# Patient Record
Sex: Female | Born: 1945 | Race: White | Hispanic: No | Marital: Single | Smoking: Former smoker
Health system: Southern US, Community
[De-identification: ages and names within clinical notes are randomized; demographics above are authoritative.]

## PROBLEM LIST (undated history)

## (undated) DIAGNOSIS — J40 Bronchitis, not specified as acute or chronic: Secondary | ICD-10-CM

## (undated) DIAGNOSIS — D649 Anemia, unspecified: Secondary | ICD-10-CM

## (undated) DIAGNOSIS — R1900 Intra-abdominal and pelvic swelling, mass and lump, unspecified site: Secondary | ICD-10-CM

## (undated) DIAGNOSIS — M1990 Unspecified osteoarthritis, unspecified site: Secondary | ICD-10-CM

## (undated) DIAGNOSIS — I82409 Acute embolism and thrombosis of unspecified deep veins of unspecified lower extremity: Secondary | ICD-10-CM

## (undated) DIAGNOSIS — K219 Gastro-esophageal reflux disease without esophagitis: Secondary | ICD-10-CM

## (undated) DIAGNOSIS — I509 Heart failure, unspecified: Secondary | ICD-10-CM

## (undated) DIAGNOSIS — C44310 Basal cell carcinoma of skin of unspecified parts of face: Secondary | ICD-10-CM

## (undated) DIAGNOSIS — R112 Nausea with vomiting, unspecified: Secondary | ICD-10-CM

## (undated) DIAGNOSIS — I639 Cerebral infarction, unspecified: Secondary | ICD-10-CM

## (undated) DIAGNOSIS — J42 Unspecified chronic bronchitis: Secondary | ICD-10-CM

## (undated) DIAGNOSIS — Z9889 Other specified postprocedural states: Secondary | ICD-10-CM

## (undated) DIAGNOSIS — I809 Phlebitis and thrombophlebitis of unspecified site: Secondary | ICD-10-CM

## (undated) DIAGNOSIS — R0602 Shortness of breath: Secondary | ICD-10-CM

## (undated) DIAGNOSIS — F419 Anxiety disorder, unspecified: Secondary | ICD-10-CM

## (undated) DIAGNOSIS — C569 Malignant neoplasm of unspecified ovary: Secondary | ICD-10-CM

## (undated) HISTORY — PX: TOOTH EXTRACTION: SUR596

## (undated) HISTORY — PX: MOHS SURGERY: SUR867

## (undated) HISTORY — DX: Unspecified osteoarthritis, unspecified site: M19.90

## (undated) HISTORY — DX: Acute embolism and thrombosis of unspecified deep veins of unspecified lower extremity: I82.409

## (undated) HISTORY — DX: Intra-abdominal and pelvic swelling, mass and lump, unspecified site: R19.00

## (undated) HISTORY — DX: Phlebitis and thrombophlebitis of unspecified site: I80.9

## (undated) HISTORY — DX: Bronchitis, not specified as acute or chronic: J40

---
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
## 1974-04-24 HISTORY — PX: RHINOPLASTY: SUR1284

## 1996-04-24 HISTORY — PX: VEIN LIGATION AND STRIPPING: SHX2653

## 1998-12-14 ENCOUNTER — Other Ambulatory Visit: Admission: RE | Admit: 1998-12-14 | Discharge: 1998-12-14 | Payer: Self-pay | Admitting: *Deleted

## 1999-02-21 ENCOUNTER — Other Ambulatory Visit: Admission: RE | Admit: 1999-02-21 | Discharge: 1999-02-21 | Payer: Self-pay | Admitting: General Surgery

## 2000-01-17 ENCOUNTER — Encounter: Payer: Self-pay | Admitting: General Surgery

## 2000-01-17 ENCOUNTER — Encounter: Admission: RE | Admit: 2000-01-17 | Discharge: 2000-01-17 | Payer: Self-pay | Admitting: General Surgery

## 2001-02-22 ENCOUNTER — Ambulatory Visit (HOSPITAL_COMMUNITY): Admission: RE | Admit: 2001-02-22 | Discharge: 2001-02-22 | Payer: Self-pay | Admitting: General Surgery

## 2001-02-22 ENCOUNTER — Encounter: Payer: Self-pay | Admitting: General Surgery

## 2002-06-03 ENCOUNTER — Ambulatory Visit (HOSPITAL_COMMUNITY): Admission: RE | Admit: 2002-06-03 | Discharge: 2002-06-03 | Payer: Self-pay | Admitting: General Surgery

## 2002-06-03 ENCOUNTER — Encounter: Payer: Self-pay | Admitting: General Surgery

## 2006-06-28 ENCOUNTER — Other Ambulatory Visit: Admission: RE | Admit: 2006-06-28 | Discharge: 2006-06-28 | Payer: Self-pay | Admitting: *Deleted

## 2006-07-05 ENCOUNTER — Ambulatory Visit (HOSPITAL_COMMUNITY): Admission: RE | Admit: 2006-07-05 | Discharge: 2006-07-05 | Payer: Self-pay | Admitting: *Deleted

## 2010-05-15 ENCOUNTER — Encounter: Payer: Self-pay | Admitting: *Deleted

## 2012-04-24 DIAGNOSIS — I82409 Acute embolism and thrombosis of unspecified deep veins of unspecified lower extremity: Secondary | ICD-10-CM

## 2012-04-24 HISTORY — DX: Acute embolism and thrombosis of unspecified deep veins of unspecified lower extremity: I82.409

## 2012-06-06 ENCOUNTER — Ambulatory Visit: Payer: Self-pay | Admitting: Gynecologic Oncology

## 2012-06-11 ENCOUNTER — Encounter: Payer: Self-pay | Admitting: *Deleted

## 2012-06-12 ENCOUNTER — Ambulatory Visit: Payer: Medicare Other | Attending: Gynecologic Oncology | Admitting: Gynecologic Oncology

## 2012-06-12 ENCOUNTER — Encounter: Payer: Self-pay | Admitting: Gynecologic Oncology

## 2012-06-12 ENCOUNTER — Ambulatory Visit (HOSPITAL_COMMUNITY)
Admission: RE | Admit: 2012-06-12 | Discharge: 2012-06-12 | Disposition: A | Discharge status: Home / Self Care | Payer: Medicare Other | Source: Ambulatory Visit | Attending: Gynecologic Oncology | Admitting: Gynecologic Oncology

## 2012-06-12 VITALS — BP 118/70 | HR 80 | Temp 98.7°F | Resp 20 | Ht 67.0 in | Wt 151.8 lb

## 2012-06-12 VITALS — BP 116/62

## 2012-06-12 DIAGNOSIS — Z86718 Personal history of other venous thrombosis and embolism: Secondary | ICD-10-CM

## 2012-06-12 DIAGNOSIS — C569 Malignant neoplasm of unspecified ovary: Secondary | ICD-10-CM

## 2012-06-12 DIAGNOSIS — R971 Elevated cancer antigen 125 [CA 125]: Secondary | ICD-10-CM | POA: Insufficient documentation

## 2012-06-12 DIAGNOSIS — C801 Malignant (primary) neoplasm, unspecified: Secondary | ICD-10-CM | POA: Insufficient documentation

## 2012-06-12 DIAGNOSIS — R1900 Intra-abdominal and pelvic swelling, mass and lump, unspecified site: Secondary | ICD-10-CM

## 2012-06-12 DIAGNOSIS — N838 Other noninflammatory disorders of ovary, fallopian tube and broad ligament: Secondary | ICD-10-CM | POA: Insufficient documentation

## 2012-06-12 DIAGNOSIS — R180 Malignant ascites: Secondary | ICD-10-CM | POA: Insufficient documentation

## 2012-06-12 NOTE — Patient Instructions (Signed)
Follow up with Dr. Livesay as scheduled. 

## 2012-06-12 NOTE — Procedures (Signed)
US guided  diagnostic/therapeutic paracentesis performed yielding 2.8 liters yellow fluid. The fluid was sent for cytology. No immediate complications.

## 2012-06-12 NOTE — Progress Notes (Signed)
Consult Note: Gyn-Onc  Monique [name] 67 y.o. female  CC:  Chief Complaint  Patient presents with  . Pelvic Mass    New pt    HPI: Patient is seen today in consultation at the request of Dr. Everlene Other.  Patient is a 67 year old gravida 0 who in May 2013 had some sharp right-sided pain. 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 She also had an onset constipation and blame this on a poor diet. She began taking Citrucel which should help her symptoms somewhat. But 3 months later she finally sought gastroenterologist for scheduling her for colonoscopy. She had n/v with the prep and she was unable to tolerate the colonoscopy and that procedure which was  delayed for 7 weeks after the initial consultation was then pushed back for an additional 3 weeks. She again took a different prep and has a nausea vomiting and a colonoscopy had to be rescheduled again with the thought being that this would not be scheduled in January. She ultimately never had a colonoscopy done. She did think that she had some component of an irritable bowel syndrome she started taking probiotic her symptoms got better.   In February she was diagnosed with probable phlebitis had an initial peripheral venous Doppler that was negative. She began having increased pain in her left thigh had repeat Doppler showed a DVT on her left side. This subsequently prompted a CT scan which she had on February 15. It revealed a small right-sided pleural effusion. There is a focal pulmonary embolism in the right lower lobe of the pulmonary artery. There is extensive ascites at the abdomen and pelvis. There was a mass in the anterior segment of the right lobe of the liver near the dome measuring 1.6 x 1.4 cm. There were no other focal liver lesions identified. The spleen, pancreas, and adrenals appeared normal. There were no renal masses or hydronephrosis. There is a  mass abutting the omentum lateral to the dome of the liver on the right measuring 3.4 x 2.3 cm. There was diffuse thickening of the omentum in the right upper quadrant anteriorly and laterally. Within the pelvis there is a complex mass was cystic and solid components measuring 12.8 x 6.6 cm. It crosses the midline and extends to both the right and left sides. There is a deep venous thrombosis in the pelvic vessels. The inferior vena cava is diminutive and felt to be involved with thrombus. There multiple small retroperitoneal lymph nodes. There was an enlarged left inguinal lymph node measuring 1.8 x 1.6 cm and a right inguinal lymph node measuring 2.2 x 1.5 cm. CA 125 was drawn and was elevated at 569.9. It is for this reason that she comes in today. She was initially treated with Lovenox for 5 days and is currently on Xeloda 50 mg twice daily for 21 days and we'll go down to a dose of 20 mg daily.   Review of Systems: Over the past 2 weeks had increasing pain. She reports a 20 pound weight loss. She's had increasing ascites over the last 2-3 weeks. She does have some shortness of breath was attributing that more to be ascites in the pulmonary embolism. Her diet has been quite poor. She denies any chest pain any fevers or chills. She had a nausea vomiting as stated above with the prep. 10 point review of systems is otherwise negative. She is a  retired Producer, television/film/video. She Pap smear in 2011 that was negative. She never had a colonoscopy. She quit smoking December 2013 when she had an episode of bronchitis.  Current Meds:  Outpatient Encounter Prescriptions as of 06/12/2012  Medication Sig Dispense Refill  . aspirin EC 81 MG tablet Take 81 mg by mouth daily.      . Multiple Vitamins-Minerals (MULTIVITAMIN PO) Take 1 tablet by mouth daily.      . Rivaroxaban (XARELTO) 15 MG TABS tablet Take 15 mg by mouth 2 (two) times daily. Starting 05/22/2012 one 15MG  tablet oral Two times daily for 21 days then  convert to 20MG  daily      . Calcium Carbonate (CALCIUM 600) 1500 MG TABS Take 1 tablet by mouth.      . Probiotic Product (PROBIOTIC DAILY PO) Take by mouth.      . TRAMADOL HCL PO Take 50 mg by mouth 3 (three) times daily as needed. 1 tablet 3x daily as needed for leg pain       No facility-administered encounter medications on file as of 06/12/2012.    Allergy:  Allergies  Allergen Reactions  . Ampicillin Itching  . Fentanyl And Related Hives  . Neosporin (Neomycin-Bacitracin Zn-Polymyx) Swelling and Other (See Comments)    Swelling and inflamed eyes  . Septra (Sulfamethoxazole-Tmp Ds) Rash and Other (See Comments)    High fever, Burning skin    Social Hx:   History   Social History  . Marital Status: Single    Spouse Name: N/A    Number of Children: N/A  . Years of Education: N/A   Occupational History  . Not on file.   Social History Main Topics  . Smoking status: Former Smoker -- 30 years    Quit date: 04/20/2012  . Smokeless tobacco: Not on file     Comment: quit for a couple years then started back  . Alcohol Use: No  . Drug Use: No  . Sexually Active: Not on file   Other Topics Concern  . Not on file   Social History Narrative  . No narrative on file    Past Surgical Hx:  Past Surgical History  Procedure Laterality Date  . Appendectomy  1966    ruptured, hospital for 3 weeks  . Rhinoplasty  1976  . Vein ligation and stripping  1998    right leg  . Mohs surgery  2004, 2005    basal cell of the face    Past Medical Hx:  Past Medical History  Diagnosis Date  . Arthritis   . Skin cancer   . Acute venous embolism and thrombosis of unspecified deep vessels of lower extremity   . Bronchitis   . Abdominal or pelvic swelling, mass or lump, unspecified site   . Phlebitis and thrombophlebitis of unspecified site   . Pelvic mass     Family Hx:  Family History  Problem Relation Age of Onset  . Pneumonia Mother   . Heart attack Father      Vitals:  Blood pressure 118/70, pulse 80, temperature 98.7 F (37.1 C), resp. rate 20, height 5\' 7"  (1.702 m), weight 151 lb 12.8 oz (68.856 kg).  Physical Exam: Thin female in no acute distress.  Neck: Supple, no lymphadenopathy, no thyromegaly.  Lungs: Clear to auscultation bilaterally.  Cardiovascular exam: Regular rate and rhythm.  Abdomen: Distended. She's a vertical midline incision. She is a positive fluid wave. There is a sense of fullness in the lower  abdomen due difficult to discern a specific mass secondary to tense ascites. There is no rebound or guarding.  Groins: No lymphadenopathy.  Extremities: Varicosities left greater than right 1+ nonpitting edema on the left greater than right.  Pelvic: Normal external female genitalia. Bimanual examination was approximately 15 cm mass within the pelvis with a mass deep in the cul-de-sac it is not mobile. 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 I had a lengthy discussion with the patient as well as her significant other. Greater than 45 minutes face to face time was spent with the patient and her partner. I discussed with them that typically we would proceed with a debulking surgery and attempt to make her optimally cyto-reduced. However, in the setting of an acute pulmonary embolism as well as clot being noted within the vena cava, I believe we should start with a neoadjuvant approach. They seem to understand the rationale for this and are open to starting with chemotherapy.   Because of her symptoms as well as the need to make a diagnosis we did speak with radiology she'll undergo a paracentesis today for both diagnostic as well as therapeutic purposes.   We discussed starting with chemotherapy and that that would involve paclitaxel and carboplatin in either an every three-week or weekly  schedule. That we would proceed with a CT scan after 3 cycles of chemotherapy and then at that point determine whether or not that is the appropriate time to intervene with a debulking surgery. We will need to place an inferior vena cava filter and transition her to Lovenox in the perioperative period of time. She understands if we proceed with surgery after 3 cycles of chemotherapy that she will require chemotherapy after she recovers from surgery. I discussed with them that I may recommend a postsurgery be done at [facility] secondary to complex he of her case, and they are open to this if that is what is best in her opinion. Their questions were elicited in answer to their satisfaction. She has my card and knows to contact me if she has any questions. We will arrange for consultation with Dr. Darrold Span for chemotherapy and treatment coordination  Ikechukwu Cerny A., MD 06/12/2012, 3:10 PM

## 2012-06-13 ENCOUNTER — Other Ambulatory Visit (HOSPITAL_COMMUNITY): Payer: Medicare Other

## 2012-06-14 ENCOUNTER — Other Ambulatory Visit: Payer: Self-pay | Admitting: Oncology

## 2012-06-14 DIAGNOSIS — C569 Malignant neoplasm of unspecified ovary: Secondary | ICD-10-CM

## 2012-06-14 NOTE — Progress Notes (Signed)
Medical Oncology note of information  Request from gyn oncology to see this new patient for neoadjuvant chemotherapy. Information given to Tiffany to schedule her on my call day 06-19-12 + lab; chemo education class prior if possible (dose dense taxol [place]). Ila Mcgill, MD

## 2012-06-16 ENCOUNTER — Other Ambulatory Visit: Payer: Self-pay | Admitting: Oncology

## 2012-06-17 ENCOUNTER — Telehealth: Payer: Self-pay | Admitting: Oncology

## 2012-06-17 NOTE — Telephone Encounter (Signed)
S/W pt in re NP appt on 02/26 @ 9 w/Dr. Darrold Span. Chemo Edu 02/25 @ 12.  Pt confirmed appts.  Welcome packet at registration.

## 2012-06-18 ENCOUNTER — Encounter: Payer: Self-pay | Admitting: *Deleted

## 2012-06-18 ENCOUNTER — Other Ambulatory Visit: Payer: Medicare Other

## 2012-06-19 ENCOUNTER — Encounter: Payer: Self-pay | Admitting: Oncology

## 2012-06-19 ENCOUNTER — Other Ambulatory Visit: Payer: Self-pay | Admitting: Gynecology

## 2012-06-19 ENCOUNTER — Telehealth: Payer: Self-pay | Admitting: Oncology

## 2012-06-19 ENCOUNTER — Inpatient Hospital Stay
Admission: RE | Admit: 2012-06-19 | Discharge: 2012-06-19 | Disposition: A | Discharge status: Home / Self Care | Payer: Self-pay | Source: Ambulatory Visit | Attending: Gynecology | Admitting: Gynecology

## 2012-06-19 ENCOUNTER — Ambulatory Visit (HOSPITAL_BASED_OUTPATIENT_CLINIC_OR_DEPARTMENT_OTHER): Payer: Medicare Other

## 2012-06-19 ENCOUNTER — Ambulatory Visit (HOSPITAL_BASED_OUTPATIENT_CLINIC_OR_DEPARTMENT_OTHER): Payer: Medicare Other | Admitting: Oncology

## 2012-06-19 ENCOUNTER — Other Ambulatory Visit (HOSPITAL_BASED_OUTPATIENT_CLINIC_OR_DEPARTMENT_OTHER): Payer: Medicare Other | Admitting: Lab

## 2012-06-19 ENCOUNTER — Other Ambulatory Visit: Payer: Self-pay | Admitting: Oncology

## 2012-06-19 ENCOUNTER — Other Ambulatory Visit: Payer: Self-pay | Admitting: *Deleted

## 2012-06-19 ENCOUNTER — Telehealth: Payer: Self-pay | Admitting: *Deleted

## 2012-06-19 VITALS — BP 121/59 | HR 74 | Temp 97.3°F | Resp 18 | Ht 67.0 in | Wt 146.9 lb

## 2012-06-19 DIAGNOSIS — C569 Malignant neoplasm of unspecified ovary: Secondary | ICD-10-CM

## 2012-06-19 DIAGNOSIS — C801 Malignant (primary) neoplasm, unspecified: Secondary | ICD-10-CM

## 2012-06-19 DIAGNOSIS — Z23 Encounter for immunization: Secondary | ICD-10-CM

## 2012-06-19 DIAGNOSIS — Z86718 Personal history of other venous thrombosis and embolism: Secondary | ICD-10-CM

## 2012-06-19 DIAGNOSIS — R188 Other ascites: Secondary | ICD-10-CM

## 2012-06-19 DIAGNOSIS — C786 Secondary malignant neoplasm of retroperitoneum and peritoneum: Secondary | ICD-10-CM

## 2012-06-19 DIAGNOSIS — R194 Change in bowel habit: Secondary | ICD-10-CM

## 2012-06-19 DIAGNOSIS — R198 Other specified symptoms and signs involving the digestive system and abdomen: Secondary | ICD-10-CM

## 2012-06-19 LAB — COMPREHENSIVE METABOLIC PANEL (CC13)
CO2: 28 mEq/L (ref 22–29)
Creatinine: 0.8 mg/dL (ref 0.6–1.1)
Glucose: 92 mg/dl (ref 70–99)
Total Bilirubin: 0.35 mg/dL (ref 0.20–1.20)
Total Protein: 7.3 g/dL (ref 6.4–8.3)

## 2012-06-19 LAB — CBC WITH DIFFERENTIAL/PLATELET
Basophils Absolute: 0.1 10*3/uL (ref 0.0–0.1)
Eosinophils Absolute: 0.1 10*3/uL (ref 0.0–0.5)
HGB: 11.9 g/dL (ref 11.6–15.9)
LYMPH%: 14.1 % (ref 14.0–49.7)
MCV: 82.9 fL (ref 79.5–101.0)
MONO%: 8.7 % (ref 0.0–14.0)
NEUT#: 4.9 10*3/uL (ref 1.5–6.5)
NEUT%: 74.8 % (ref 38.4–76.8)
Platelets: 353 10*3/uL (ref 145–400)

## 2012-06-19 LAB — CEA: CEA: <0.5 ng/mL (ref 0.0–5.0)

## 2012-06-19 MED ORDER — ONDANSETRON HCL 8 MG PO TABS: 8.0000 mg | ORAL_TABLET | Freq: Two times a day (BID) | ORAL | Status: DC | PRN

## 2012-06-19 MED ORDER — LORAZEPAM 0.5 MG PO TABS: ORAL_TABLET | ORAL | Status: DC

## 2012-06-19 MED ORDER — DEXAMETHASONE 4 MG PO TABS: ORAL_TABLET | ORAL | Status: DC

## 2012-06-19 MED ORDER — PNEUMOCOCCAL VAC POLYVALENT 25 MCG/0.5ML IJ INJ
0.5000 mL | INJECTION | Freq: Once | INTRAMUSCULAR | Status: AC
  Administered 2012-06-19: 0.5 mL via INTRAMUSCULAR
  Filled 2012-06-19: qty 0.5

## 2012-06-19 NOTE — Progress Notes (Signed)
Checked in new patient. No financial issues. °

## 2012-06-19 NOTE — Patient Instructions (Signed)
Call if any questions or concerns, 24 hours/ every day     [phone]  Chemotherapy will be 9:15 AM on 2-28.  Twelve hours before chemo, ~ 9 PM on 2-27, you will take five of the 4 mg decadron tablets (=20 mg) with food. Again 6 hours before chemo, at ~ 3 AM on 2-28, you will take five of the 4 mg decadron tablets (=20mg ) with food.  ( If you do fine with the first 1-2  taxol infusions, we will decrease this premedication steroid to just the 12 hour prior dose.)  We will call in the decadron (dexamethasone), and also zofran (ondansetron) 8 mg to use 1-2 tablets (8-16 mg) every 12 hours as needed for nausea -- will not make drowsy, and Ativan 0.5 mg half to 1 tablet swallow or sublingual every 6 hrs as needed for nausea -- will make you drowsy  Night that you have chemo you should take 1/2 ativan at bedtime whether or not any nausea, and AM after chemo you should take zofran whether or not any nausea. Other than those doses, you can use these if any nausea/ queasiness.  If much reflux we should try zantac or equivalent in addition to [place].  Senokot S or pharmacy equivalent is often good if more constipation for a couple of days after chemo, or miralax. You can use the Senokot S 1-2 once or twice daily as needed.

## 2012-06-19 NOTE — Progress Notes (Signed)
[facility] NEW PATIENT EVALUATION   Name: Monique [name] Date: 06/19/2012 MRN: [id number] DOB: 30-May-1945  REFERRING PHYSICIAN: P.Gehrig CC: Aura Dials, MD (PCP [facility] [place] Monique [name],  Monique [name])   REASON FOR REFERRAL: newly diagnosed gyn cancer, for consideration of neoadjuvant chemotherapy    HISTORY OF PRESENT ILLNESS:Monique [name] is a 67 y.o. female who is seen at the request of Monique [name], with apparent advanced gyn adenocarcinoma, for possible neoadjuvant chemotherapy. Only intervention thus far has been diagnostic and therapeutic paracentesis of 2.8 liters on 06-12-12. Primary care is by Monique [name].  Patient developed sudden onset of constipation with some right abdominal pain in May 2013. She saw GI physician in ~ aug 2013, however was unable to tolerate prep for colonoscopy due to vomiting initially, then rescheduled the procedure and was again unable to tolerate prep such that this was never accomplished. Constipation improved with probiotics and Activia, now soft formed stools several times daily with just Activia. She had been aware of abdominal fullness more recently, with early satiety and some reflux symptoms. She developed LLE superficial phlebitis in Jan 2014 (dopplers by PCP negative for DVT then), followed by swelling and discomfort in left medial thigh with venous doppler at [facility] in [facility] on 05-21-12 with acute thrombus left common femoral vein. 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 She was begun on lovenox and transitioned to Xarelto by Monique [name], presently on xarelto 20 mg daily. CA 125 was 569 per Monique [name] note. She was seen by Monique [name] in [place] on 06-12-12, her exam remarkable for distended abdomen with fluid wave and 15 cm fixed mass in pelvis. As the inguinal nodes were not palpable and as she needed paracentesis for symptoms, an FNA of inguinal node was not done and she instead had US paracentesis by IR 06-12-12 for 2.8 liters of yellow fluid, cytology ( NZB14 - 126) with adenocarcinoma.  Monique [name] recommended neoadjuvant taxol/carboplatin chemotherapy either weekly dose dense or q 3 week cycles, with restaging scans after 3 cycles and consideration of interval surgery. Patient is aware that she will need IVC filter at least prior to surgery.  Patient attended chemotherapy teaching class prior to visit today.  REVIEW OF SYSTEMS:  No HA or [name] neurologic symptoms Good visual acuity with glasses.  No difficulty hearing No sinus symptoms No active dental problems. Has screw in place for dental implant since last year, that procedure not completed. Has lost weight, amount not clear due to the ascites. Had weighed 175 in 2011 "too heavy" and 168 in June 2013. Appetite is good tho some early satiety, and she tries to eat balanced diet. No nausea or vomiting. Has had significant reflux, improved with TUMS and she prefers not to try OTC or prescription H2 blocker yet. Bowels as above.  No SOB since  paracentesis, no cough, no chest pain. Had acute bronchitis in Dec 2013, treated with azithromycin. Stopped smoking then. Occasional palpitations, previously evaluated by Monique [name] Overdue mammograms. Some irregularities in bilateral breasts more noticeable with weight loss Voiding more  often at night, up 3-4x now, significant amounts, no dysuria No bleeding No swelling now in LLE. No tenderness thigh or lower leg Low back pain better since weight loss Some arthritis right hand  ALLERGIES: Ampicillin; Fentanyl and related; Neosporin; and Septra  PAST MEDICAL HISTORY:  Gravida 0 Appendectomy for ruptured appendix 1966 Rhinoplasty 1976 RLE vein stripping 1998 MOHS surgery for basal cell cas 2004, 2005 Last mammograms at [facility] 2008 Never colonoscopy Cardiology evaluation for palpitations with 48 hr Holter and ETT 2011, no B blockeer due to one episode of HR 40s while asleep. Did have flu vaccine this year Pneumovax given today at patient's request.  CURRENT MEDICATIONS: We administered pneumococcal 23 valent vaccine. [name] medications reviewed as listed in EMR. Prescriptions for decadron 20 mg 12 hrs and 6 hrs prior to taxol, zofran and ativan to [facility]    SOCIAL HISTORY:  reports that she quit smoking about 1 months ago. She does not have any smokeless tobacco history on file. She reports that she does not drink alcohol or use illicit drugs. Originally from [place], went to [employer] and nursing school in [place]. Retired Charity fundraiser, worked in Training and development officer at [employer]. Cigarettes x 30 years, varying amounts between 5 cigarettes and 1.5 packs daily, stopped various times and most recently stopped Dec 2-13 after bronchitis. Has gentleman friend who can assist with some transportation for chemo. Close friend died last year of cervical cancer and CVA. She describes herself as a very private person, is anxious about surgical procedures being done at [facility]. Has dog. Living Will and HCPOA completed, copied to be scanned into this EMR.  FAMILY HISTORY: family history includes Heart attack in her father and Pneumonia in her mother. Mother had cervical spine fracture in fall, was on ventilator and contracted pneumonia. No siblings No children  LABORATORY DATA:  Results for  orders placed in visit on 06/19/12 (from the past 48 hour(s))  CBC WITH DIFFERENTIAL     Status: Abnormal   Collection Time    06/19/12  9:06 AM      Result Value Range   WBC 6.5  3.9 - 10.3 10e3/uL   NEUT# 4.9  1.5 - 6.5 10e3/uL   HGB 11.9  11.6 - 15.9 g/dL   HCT 16.1  09.6 - 04.5 %   Platelets 353  145 - 400 10e3/uL   MCV 82.9  79.5 - 101.0 fL   MCH 27.0  25.1 - 34.0 pg   MCHC 32.6  31.5 - 36.0 g/dL   RBC 4.09  8.11 - 9.14 10e6/uL   RDW 16.0 (*) 11.2 - 14.5 %   lymph# 0.9  0.9 - 3.3 10e3/uL   MONO# 0.6  0.1 - 0.9 10e3/uL   Eosinophils Absolute 0.1  0.0 - 0.5 10e3/uL   Basophils Absolute 0.1  0.0 - 0.1 10e3/uL   NEUT% 74.8  38.4 - 76.8 %   LYMPH% 14.1  14.0 - 49.7 %   MONO% 8.7  0.0 - 14.0 %   EOS% 1.2  0.0 - 7.0 %   BASO% 1.2  0.0 - 2.0 %  COMPREHENSIVE METABOLIC PANEL (CC13)     Status: Abnormal   Collection Time    06/19/12  9:06 AM  Result Value Range   Sodium 140  136 - 145 mEq/L   Potassium 4.1  3.5 - 5.1 mEq/L   Chloride 103  98 - 107 mEq/L   CO2 28  22 - 29 mEq/L   Glucose 92  70 - 99 mg/dl   BUN 56.2  7.0 - 13.0 mg/dL   Creatinine 0.8  0.6 - 1.1 mg/dL   Total Bilirubin 8.65  0.20 - 1.20 mg/dL   Alkaline Phosphatase 134  40 - 150 U/L   AST 25  5 - 34 U/L   ALT 16  0 - 55 U/L   Total Protein 7.3  6.4 - 8.3 g/dL   Albumin 2.7 (*) 3.5 - 5.0 g/dL   Calcium 9.9  8.4 - 78.4 mg/dL     CA 696 pending CEA pending (see assessment below)  PATHOLOGY  Accession #: [id number] Collected Date: 06/12/2012  Diagnosis PERITONEAL/ASCITIC FLUID: MALIGNANT CELLS CONSISTENT WITH METASTATIC ADENOCARCINOMA. Jimmy Picket MD Pathologist, Electronic Signature (Case signed 06/14/2012) Specimen Clinical Information Pelvic mass, elevated CA125. Source Peritoneal/Ascitic Fluid Gross Specimen: Received is/are 556cc's of tan fluid with tissue. (BS:bs) Prepared: # Smears: 0 # Concentration Technique Slides (i.e. ThinPrep): 1 # Cell Block: 1 Additional Studies:  n/a Comment Comment: The differential includes metastatic ovarian carcinoma.  RADIOGRAPHY:  CT AP [facility] 05-29-2012  Report to be scanned into this EMR  [name] than lung bases on this CT, I do not believe that she has had any chest imaging with this illness.     PHYSICAL EXAM:  height is 5\' 7"  (1.702 m) and weight is 146 lb 14.4 oz (66.633 kg). Her oral temperature is 97.3 F (36.3 C). Her blood pressure is 121/59 and her pulse is 74. Her respiration is 18.  Very pleasant lady looks stated age, easily ambulatory, respirations not labored. Excellent historian. HEENT:  Normal hair pattern. PERRL, not icteric, EOMI. Oral mucosa moist and clear, no obvious dental problems. Neck supple without JVD or mass.  Lungs clear to bases bilaterally to A & P. Back not tender.  Heart RRR, no gallop, no murmur, clear heart sounds Breasts bilaterally with some minimal irregularities, no dominant mass or skin/ nipple findings. Right breast has 0.5 cm smooth rounded area consistent with cyst at 3:00. Left breast has slightly irregular thickening ~ 1 cm upper outer which patient states has been stable x years. Left  with 0.5x 1 cm mobile soft node in anterior axilla; right axilla not remarkable. No swelling either UE. Abdomen distended in upper quadrants bilaterally, not tight, not tender epigastrium, full in lower quadrants, quiet, not tender otherwise. No clear HSM or mass. LE no pitting edema bilaterally. Varicose veins bilaterally. Firm area ~ 1x2 cm with minimal erythema not tender left calf medially. Less defined area medial thigh ~ 2 cm not tender, no erythema or heat. UE no swelling or cords. Peripheral veins prominent. Lymphatics: no cervical, supraclavicular, right axillary or left inguinal adenopathy appreciated. Left axillary node as above. Vague fullness right inguinal region without clear adenopathy. Neuro without focal deficits CN, motor, sensory, cerebellar. Skin without rash or  ecchymosis. Not icteric.     All of history above reviewed with patient. We have discussed the presumed diagnosis of primary gyn malignancy based on CT information, gyn oncologist's exam, CA 125 and cytology from ascites. Following the visit, I have LM for pathologist in case additional immunohistochemical stains might be considered given rest of history, tho I agree with Monique [name] that clinically this  is entirely consistent with gyn primary. We have disucssed usefulness of neoadjuvant chemotherapy in gyn carcinomas, and reviewed the dose dense vs every 3 week taxol/ carboplatin. She is glad to start with the dose dense regimen and would like to start treatment as quickly as possible. We have discussed premedication steroids for taxol, which I would like to start at 20 mg 12 and 6 hrs prior, but hopefully will be able to decrease to one dose 12 hrs prior if she has no problems with taxol reaction. We have discussed antiemetics, how to contact this office/ on call MD, taxol aches, IVC filter by IR, anticoagulation concerns (thrombocytopenia from chemo), gCSF, peripheral IV access for chemo, transportation for chemo. We have been able to schedule day 1 cycle 1 carbo/ taxol for 06-21-12. I will dose reduce cycle 1 carboplatin due to IVC /pelvic/ LE clot with PE.    IMPRESSION / PLAN:  1. 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 I will see her back 06-26-12 with CBC CMET prior to day 8 on 06-28-12, then with day 15 treatment on 07-05-12. Expect to repeat scans and see Monique [name] after cycle 3. 2.Change in bowel habits in past year, unable to tolerate prep for colonoscopy. Bowels moving well now. She understands that she may have more constipation related to chemotherapy and can use miralax or senokot S if so. Have sent CEA with this history. 3.long tobacco recently discontinued. Will get baseline CXR and expect to get full  CT chest with reimaging scans. 4.extensive DVT involving IVC, pelvic veins and left common femoral, as well as superficial phlebitis LLE: continuing xarelto. Will need IVC filter prior to surgery. Hopefully platelets will maintain with chemo, carbo dose decreased to AUC 4 for this first cycle. 5.overdue mammograms: no obvious significant concerns on PE 6.ruptured appendix remotely 7.basal cell carcinomas 8. Pneumovax given today 9.Advance Directives and HCPOA done   Patient was comfortable with discussion, had questions answered to her satisfaction and was in agreement with plan above. Note message to pathologist and CXR order done after visit. Time spent face to face > 60 min, including > 50% in discussion, with additional coordination of care also.  Chace Bisch P, MD 06/19/2012 12:18 PM

## 2012-06-19 NOTE — Telephone Encounter (Signed)
Per staff message and POF I have scheduled appts.  JMW  

## 2012-06-19 NOTE — Telephone Encounter (Signed)
Gave pt appt for lab and Md for February and MArch 2014 emailed Marcelino Duster regarding chemo

## 2012-06-20 ENCOUNTER — Telehealth: Payer: Self-pay | Admitting: *Deleted

## 2012-06-20 ENCOUNTER — Telehealth: Payer: Self-pay | Admitting: Oncology

## 2012-06-20 NOTE — Telephone Encounter (Signed)
Message copied by Phillis Knack on Thu Jun 20, 2012 12:08 PM ------      Message from: Jama Flavors P      Created: Wed Jun 19, 2012  4:25 PM       Labs seen and need follow up: please let her know that chemistries all good except albumin low at 2.7, which is frequently seen with ascites. It would be best if she can increase protein in diet; she may want to speak to [facility] dietician at some point. Please also tell her that I would like plain CXR at [facility] this week or next, ok to coordinate with appointment at [facility]. I have already sent that request to schedulers.      cc LA, TH ------

## 2012-06-20 NOTE — Telephone Encounter (Signed)
Pt called back to say she had a CXR done at [facility] in January. Wants to know if it necessary to have another one. Informed her that RN will get CXR report for Dr Darrold Span to review. Will let her know on Friday when she comes for chemo.

## 2012-06-20 NOTE — Telephone Encounter (Signed)
Called pt and reminded her to go to Radiology for chest x-ray

## 2012-06-20 NOTE — Telephone Encounter (Signed)
Notified patient of Dr Darrold Span note below. States she is aware of how to increase protein in diet.

## 2012-06-20 NOTE — Telephone Encounter (Signed)
Message copied by Phillis Knack on Thu Jun 20, 2012  1:46 PM ------      Message from: Jama Flavors P      Created: Wed Jun 19, 2012  4:25 PM       Labs seen and need follow up: please let her know that chemistries all good except albumin low at 2.7, which is frequently seen with ascites. It would be best if she can increase protein in diet; she may want to speak to [facility] dietician at some point. Please also tell her that I would like plain CXR at [facility] this week or next, ok to coordinate with appointment at [facility]. I have already sent that request to schedulers.      cc LA, TH ------

## 2012-06-20 NOTE — Telephone Encounter (Signed)
No answer

## 2012-06-20 NOTE — Telephone Encounter (Signed)
Called pt left message regarding appt tomorrow and March 2014

## 2012-06-21 ENCOUNTER — Other Ambulatory Visit: Payer: Medicare Other | Admitting: Lab

## 2012-06-21 ENCOUNTER — Ambulatory Visit (HOSPITAL_BASED_OUTPATIENT_CLINIC_OR_DEPARTMENT_OTHER): Payer: Medicare Other

## 2012-06-21 VITALS — BP 117/61 | HR 55 | Temp 97.6°F | Resp 22

## 2012-06-21 DIAGNOSIS — Z5111 Encounter for antineoplastic chemotherapy: Secondary | ICD-10-CM

## 2012-06-21 DIAGNOSIS — C569 Malignant neoplasm of unspecified ovary: Secondary | ICD-10-CM

## 2012-06-21 MED ORDER — FAMOTIDINE IN NACL 20-0.9 MG/50ML-% IV SOLN
20.0000 mg | Freq: Once | INTRAVENOUS | Status: AC
  Administered 2012-06-21: 20 mg via INTRAVENOUS

## 2012-06-21 MED ORDER — HEPARIN SOD (PORK) LOCK FLUSH 100 UNIT/ML IV SOLN
500.0000 [IU] | Freq: Once | INTRAVENOUS | Status: DC | PRN
  Filled 2012-06-21: qty 5

## 2012-06-21 MED ORDER — ONDANSETRON 16 MG/50ML IVPB (CHCC)
16.0000 mg | Freq: Once | INTRAVENOUS | Status: AC
  Administered 2012-06-21: 16 mg via INTRAVENOUS

## 2012-06-21 MED ORDER — DEXAMETHASONE SODIUM PHOSPHATE 4 MG/ML IJ SOLN
20.0000 mg | Freq: Once | INTRAMUSCULAR | Status: AC
  Administered 2012-06-21: 20 mg via INTRAVENOUS

## 2012-06-21 MED ORDER — SODIUM CHLORIDE 0.9 % IV SOLN
394.8000 mg | Freq: Once | INTRAVENOUS | Status: AC
  Administered 2012-06-21: 390 mg via INTRAVENOUS
  Filled 2012-06-21: qty 39

## 2012-06-21 MED ORDER — PACLITAXEL CHEMO INJECTION 300 MG/50ML
80.0000 mg/m2 | Freq: Once | INTRAVENOUS | Status: AC
  Administered 2012-06-21: 144 mg via INTRAVENOUS
  Filled 2012-06-21: qty 24

## 2012-06-21 MED ORDER — SODIUM CHLORIDE 0.9 % IJ SOLN
10.0000 mL | INTRAMUSCULAR | Status: DC | PRN
  Filled 2012-06-21: qty 10

## 2012-06-21 MED ORDER — DIPHENHYDRAMINE HCL 50 MG/ML IJ SOLN
50.0000 mg | Freq: Once | INTRAMUSCULAR | Status: AC
  Administered 2012-06-21: 50 mg via INTRAVENOUS

## 2012-06-21 MED ORDER — SODIUM CHLORIDE 0.9 % IV SOLN
Freq: Once | INTRAVENOUS | Status: AC
  Administered 2012-06-21: 10:00:00 via INTRAVENOUS

## 2012-06-21 NOTE — Patient Instructions (Signed)
[facility] Discharge Instructions for Patients Receiving Chemotherapy  Today you received the following chemotherapy agents: taxol, carboplatin  To help prevent nausea and vomiting after your treatment, we encourage you to take your nausea medication.  Take it as often as prescribed.    If you develop nausea and vomiting that is not controlled by your nausea medication, call the clinic. If it is after clinic hours your family physician or the after hours number for the clinic or go to the Emergency Department.   BELOW ARE SYMPTOMS THAT SHOULD BE REPORTED IMMEDIATELY:  *FEVER GREATER THAN 100.5 F  *CHILLS WITH OR WITHOUT FEVER  NAUSEA AND VOMITING THAT IS NOT CONTROLLED WITH YOUR NAUSEA MEDICATION  *UNUSUAL SHORTNESS OF BREATH  *UNUSUAL BRUISING OR BLEEDING  TENDERNESS IN MOUTH AND THROAT WITH OR WITHOUT PRESENCE OF ULCERS  *URINARY PROBLEMS  *BOWEL PROBLEMS  UNUSUAL RASH Items with * indicate a potential emergency and should be followed up as soon as possible.  One of the nurses will contact you 24 hours after your treatment. Please let the nurse know about any problems that you may have experienced. Feel free to call the clinic you have any questions or concerns. The clinic phone number is [phone].   I have been informed and understand all the instructions given to me. I know to contact the clinic, my physician, or go to the Emergency Department if any problems should occur. I do not have any questions at this time, but understand that I may call the clinic during office hours   should I have any questions or need assistance in obtaining follow up care.    __________________________________________  _____________  __________ Signature of Patient or Authorized Representative            Date                   Time    __________________________________________ Nurse's Signature   Paclitaxel injection (Taxol) What is this medicine? PACLITAXEL (PAK  li TAX el) is a chemotherapy drug. It targets fast dividing cells, like cancer cells, and causes these cells to die. This medicine is used to treat ovarian cancer, breast cancer, and other cancers. This medicine may be used for other purposes; ask your health care provider or pharmacist if you have questions. What should I tell my health care provider before I take this medicine? They need to know if you have any of these conditions: -blood disorders -irregular heartbeat -infection (especially a virus infection such as chickenpox, cold sores, or herpes) -liver disease -previous or ongoing radiation therapy -an unusual or allergic reaction to paclitaxel, alcohol, polyoxyethylated castor oil, other chemotherapy agents, other medicines, foods, dyes, or preservatives -pregnant or trying to get pregnant -breast-feeding How should I use this medicine? This drug is given as an infusion into a vein. It is administered in a hospital or clinic by a specially trained health care professional. Talk to your pediatrician regarding the use of this medicine in children. Special care may be needed. Overdosage: If you think you have taken too much of this medicine contact a poison control center or emergency room at once. NOTE: This medicine is only for you. Do not share this medicine with others. What if I miss a dose? It is important not to miss your dose. Call your doctor or health care professional if you are unable to keep an appointment. What may interact with this medicine? Do not take this medicine with any of the   following medications: -disulfiram -metronidazole This medicine may also interact with the following medications: -cyclosporine -dexamethasone -diazepam -ketoconazole -medicines to increase blood counts like filgrastim, pegfilgrastim, sargramostim -other chemotherapy drugs like cisplatin, doxorubicin, epirubicin, etoposide, teniposide,  vincristine -quinidine -testosterone -vaccines -verapamil Talk to your doctor or health care professional before taking any of these medicines: -acetaminophen -aspirin -ibuprofen -ketoprofen -naproxen This list may not describe all possible interactions. Give your health care provider a list of all the medicines, herbs, non-prescription drugs, or dietary supplements you use. Also tell them if you smoke, drink alcohol, or use illegal drugs. Some items may interact with your medicine. What should I watch for while using this medicine? Your condition will be monitored carefully while you are receiving this medicine. You will need important blood work done while you are taking this medicine. This drug may make you feel generally unwell. This is not uncommon, as chemotherapy can affect healthy cells as well as cancer cells. Report any side effects. Continue your course of treatment even though you feel ill unless your doctor tells you to stop. In some cases, you may be given additional medicines to help with side effects. Follow all directions for their use. Call your doctor or health care professional for advice if you get a fever, chills or sore throat, or other symptoms of a cold or flu. Do not treat yourself. This drug decreases your body's ability to fight infections. Try to avoid being around people who are sick. This medicine may increase your risk to bruise or bleed. Call your doctor or health care professional if you notice any unusual bleeding. Be careful brushing and flossing your teeth or using a toothpick because you may get an infection or bleed more easily. If you have any dental work done, tell your dentist you are receiving this medicine. Avoid taking products that contain aspirin, acetaminophen, ibuprofen, naproxen, or ketoprofen unless instructed by your doctor. These medicines may hide a fever. Do not become pregnant while taking this medicine. Women should inform their doctor if  they wish to become pregnant or think they might be pregnant. There is a potential for serious side effects to an unborn child. Talk to your health care professional or pharmacist for more information. Do not breast-feed an infant while taking this medicine. Men are advised not to father a child while receiving this medicine. What side effects may I notice from receiving this medicine? Side effects that you should report to your doctor or health care professional as soon as possible: -allergic reactions like skin rash, itching or hives, swelling of the face, lips, or tongue -low blood counts - This drug may decrease the number of white blood cells, red blood cells and platelets. You may be at increased risk for infections and bleeding. -signs of infection - fever or chills, cough, sore throat, pain or difficulty passing urine -signs of decreased platelets or bleeding - bruising, pinpoint red spots on the skin, black, tarry stools, nosebleeds -signs of decreased red blood cells - unusually weak or tired, fainting spells, lightheadedness -breathing problems -chest pain -high or low blood pressure -mouth sores -nausea and vomiting -pain, swelling, redness or irritation at the injection site -pain, tingling, numbness in the hands or feet -slow or irregular heartbeat -swelling of the ankle, feet, hands Side effects that usually do not require medical attention (report to your doctor or health care professional if they continue or are bothersome): -bone pain -complete hair loss including hair on your head, underarms, pubic hair, eyebrows, and   eyelashes -changes in the color of fingernails -diarrhea -loosening of the fingernails -loss of appetite -muscle or joint pain -red flush to skin -sweating This list may not describe all possible side effects. Call your doctor for medical advice about side effects. You may report side effects to FDA at 1-800-FDA-1088. Where should I keep my  medicine? This drug is given in a hospital or clinic and will not be stored at home. NOTE: This sheet is a summary. It may not cover all possible information. If you have questions about this medicine, talk to your doctor, pharmacist, or health care provider.  2012, [employer] Standard. (03/23/2008 11:54:26 AM)  Carboplatin injection What is this medicine? CARBOPLATIN (KAR boe pla tin) is a chemotherapy drug. It targets fast dividing cells, like cancer cells, and causes these cells to die. This medicine is used to treat ovarian cancer and many other cancers. This medicine may be used for other purposes; ask your health care provider or pharmacist if you have questions. What should I tell my health care provider before I take this medicine? They need to know if you have any of these conditions: -blood disorders -hearing problems -kidney disease -recent or ongoing radiation therapy -an unusual or allergic reaction to carboplatin, cisplatin, other chemotherapy, other medicines, foods, dyes, or preservatives -pregnant or trying to get pregnant -breast-feeding How should I use this medicine? This drug is usually given as an infusion into a vein. It is administered in a hospital or clinic by a specially trained health care professional. Talk to your pediatrician regarding the use of this medicine in children. Special care may be needed. Overdosage: If you think you have taken too much of this medicine contact a poison control center or emergency room at once. NOTE: This medicine is only for you. Do not share this medicine with others. What if I miss a dose? It is important not to miss a dose. Call your doctor or health care professional if you are unable to keep an appointment. What may interact with this medicine? -medicines for seizures -medicines to increase blood counts like filgrastim, pegfilgrastim, sargramostim -some antibiotics like amikacin, gentamicin, neomycin, streptomycin,  tobramycin -vaccines Talk to your doctor or health care professional before taking any of these medicines: -acetaminophen -aspirin -ibuprofen -ketoprofen -naproxen This list may not describe all possible interactions. Give your health care provider a list of all the medicines, herbs, non-prescription drugs, or dietary supplements you use. Also tell them if you smoke, drink alcohol, or use illegal drugs. Some items may interact with your medicine. What should I watch for while using this medicine? Your condition will be monitored carefully while you are receiving this medicine. You will need important blood work done while you are taking this medicine. This drug may make you feel generally unwell. This is not uncommon, as chemotherapy can affect healthy cells as well as cancer cells. Report any side effects. Continue your course of treatment even though you feel ill unless your doctor tells you to stop. In some cases, you may be given additional medicines to help with side effects. Follow all directions for their use. Call your doctor or health care professional for advice if you get a fever, chills or sore throat, or other symptoms of a cold or flu. Do not treat yourself. This drug decreases your body's ability to fight infections. Try to avoid being around people who are sick. This medicine may increase your risk to bruise or bleed. Call your doctor or health care professional if   you notice any unusual bleeding. Be careful brushing and flossing your teeth or using a toothpick because you may get an infection or bleed more easily. If you have any dental work done, tell your dentist you are receiving this medicine. Avoid taking products that contain aspirin, acetaminophen, ibuprofen, naproxen, or ketoprofen unless instructed by your doctor. These medicines may hide a fever. Do not become pregnant while taking this medicine. Women should inform their doctor if they wish to become pregnant or think  they might be pregnant. There is a potential for serious side effects to an unborn child. Talk to your health care professional or pharmacist for more information. Do not breast-feed an infant while taking this medicine. What side effects may I notice from receiving this medicine? Side effects that you should report to your doctor or health care professional as soon as possible: -allergic reactions like skin rash, itching or hives, swelling of the face, lips, or tongue -signs of infection - fever or chills, cough, sore throat, pain or difficulty passing urine -signs of decreased platelets or bleeding - bruising, pinpoint red spots on the skin, black, tarry stools, nosebleeds -signs of decreased red blood cells - unusually weak or tired, fainting spells, lightheadedness -breathing problems -changes in hearing -changes in vision -chest pain -high blood pressure -low blood counts - This drug may decrease the number of white blood cells, red blood cells and platelets. You may be at increased risk for infections and bleeding. -nausea and vomiting -pain, swelling, redness or irritation at the injection site -pain, tingling, numbness in the hands or feet -problems with balance, talking, walking -trouble passing urine or change in the amount of urine Side effects that usually do not require medical attention (report to your doctor or health care professional if they continue or are bothersome): -hair loss -loss of appetite -metallic taste in the mouth or changes in taste This list may not describe all possible side effects. Call your doctor for medical advice about side effects. You may report side effects to FDA at 1-800-FDA-1088. Where should I keep my medicine? This drug is given in a hospital or clinic and will not be stored at home. NOTE: This sheet is a summary. It may not cover all possible information. If you have questions about this medicine, talk to your doctor, pharmacist, or health care  provider.  2012, [employer] Standard. (07/16/2007 2:38:05 PM) 

## 2012-06-21 NOTE — Progress Notes (Signed)
1135 - Pt reports feeling "a little lightheaded".  Pt told to let RN know if it gets any worse - possibly Benadryl side effect.

## 2012-06-24 ENCOUNTER — Telehealth: Payer: Self-pay | Admitting: *Deleted

## 2012-06-24 ENCOUNTER — Other Ambulatory Visit: Payer: Self-pay | Admitting: *Deleted

## 2012-06-24 DIAGNOSIS — C569 Malignant neoplasm of unspecified ovary: Secondary | ICD-10-CM

## 2012-06-24 DIAGNOSIS — K30 Functional dyspepsia: Secondary | ICD-10-CM

## 2012-06-24 MED ORDER — PANTOPRAZOLE SODIUM 40 MG PO TBEC: 40.0000 mg | DELAYED_RELEASE_TABLET | Freq: Every day | ORAL | Status: DC

## 2012-06-24 NOTE — Telephone Encounter (Signed)
Called patient for chemo f/u.  Reports she left a message five minutes ago.  Concerned because she took Xarelto at 9:00 am, ate yogurt and vomited at 10:00 am.  Concerned about getting a clot.  Also vomited last night at 11:00 am.  No nausea except briefly this morning and has passed as quickly as it came on.  Reports constipation.  Last bm was Thursday before chemo started.  Small bm last night after taking two doses of senokot-S.  Asked if she should take Miralax she has on hand.  Denies passing flatus.  Denies abdominal firmness or swelling but does have some ascites.  Hard to drink water but getting close to 64 oz daily.  Has indigestion and "Has been eating Tums for weeks" and took zantac.  Will notify providers.

## 2012-06-24 NOTE — Telephone Encounter (Signed)
Message copied by Augusto Garbe on Mon Jun 24, 2012 10:38 AM ------      Message from: Lorri Frederick      Created: Fri Jun 21, 2012 10:03 AM      Regarding: 1st time chemo      Contact: [phone]       Taxol/carbo ------

## 2012-06-24 NOTE — Telephone Encounter (Signed)
See Chemotherapy f/u note.  Patient reports indigestion, constipation and asked about Xarelto.  Verbal orders received and read back from dr. Darrold Span that patient does not have to repeat Xarelto.  Needs to stop taking Tums and for her to start Protonix 40 mg daily.  Patient has only taken senokot-S one day and can increase the dose.  Okay to take Mira lax if she'd like.  Called patient with these orders.  Asked that we change her pharmacy to the [facility] on the corner of [address] because it's closer to her.  Patient able to tell me all the instructions she's been given and how to follow.  Has already taken two senokot-S reportedly.

## 2012-06-26 ENCOUNTER — Encounter: Payer: Self-pay | Admitting: Oncology

## 2012-06-26 ENCOUNTER — Ambulatory Visit (HOSPITAL_BASED_OUTPATIENT_CLINIC_OR_DEPARTMENT_OTHER): Payer: Medicare Other | Admitting: Oncology

## 2012-06-26 ENCOUNTER — Other Ambulatory Visit (HOSPITAL_BASED_OUTPATIENT_CLINIC_OR_DEPARTMENT_OTHER): Payer: Medicare Other | Admitting: Lab

## 2012-06-26 VITALS — BP 103/60 | HR 63 | Temp 97.9°F | Resp 20 | Ht 67.0 in | Wt 144.9 lb

## 2012-06-26 DIAGNOSIS — I82409 Acute embolism and thrombosis of unspecified deep veins of unspecified lower extremity: Secondary | ICD-10-CM

## 2012-06-26 DIAGNOSIS — C801 Malignant (primary) neoplasm, unspecified: Secondary | ICD-10-CM

## 2012-06-26 DIAGNOSIS — I808 Phlebitis and thrombophlebitis of other sites: Secondary | ICD-10-CM

## 2012-06-26 DIAGNOSIS — C569 Malignant neoplasm of unspecified ovary: Secondary | ICD-10-CM

## 2012-06-26 DIAGNOSIS — C786 Secondary malignant neoplasm of retroperitoneum and peritoneum: Secondary | ICD-10-CM

## 2012-06-26 LAB — COMPREHENSIVE METABOLIC PANEL (CC13)
ALT: 17 U/L (ref 0–55)
AST: 31 U/L (ref 5–34)
Albumin: 2.8 g/dL — ABNORMAL LOW (ref 3.5–5.0)
Alkaline Phosphatase: 136 U/L (ref 40–150)
BUN: 16.8 mg/dL (ref 7.0–26.0)
Chloride: 102 mEq/L (ref 98–107)
Potassium: 4.1 mEq/L (ref 3.5–5.1)
Sodium: 139 mEq/L (ref 136–145)
Total Protein: 7.4 g/dL (ref 6.4–8.3)

## 2012-06-26 LAB — CBC WITH DIFFERENTIAL/PLATELET
BASO%: 0.8 % (ref 0.0–2.0)
EOS%: 1.4 % (ref 0.0–7.0)
MCH: 27.2 pg (ref 25.1–34.0)
MCHC: 33.2 g/dL (ref 31.5–36.0)
MCV: 82.1 fL (ref 79.5–101.0)
MONO%: 4.9 % (ref 0.0–14.0)
RBC: 4.21 10*6/uL (ref 3.70–5.45)
RDW: 16.2 % — ABNORMAL HIGH (ref 11.2–14.5)
lymph#: 0.8 10*3/uL — ABNORMAL LOW (ref 0.9–3.3)

## 2012-06-26 NOTE — Patient Instructions (Addendum)
Decrease premedication decadron to five tablets (=20 mg) 12 hours prior to taxol. Take with food.  Hold ASA and probiotic now  OK to use either 300 mg zantac (2 tablets) or protonix daily

## 2012-06-26 NOTE — Progress Notes (Signed)
OFFICE PROGRESS NOTE   06/26/2012   Physicians:P.Lazaro Arms, MD (PCP [facility] [place] Elpidio Anis, Verdis Prime)   INTERVAL HISTORY:   Patient is seen, alone for visit, now having begun neoadjuvant dose dense carbo/taxol for advanced gyn carcinoma, day 1 cycle 1 given 06-21-12. 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 She had paracentesis for 2.8 liters of malignant ascites on 06-12-12. She does not have PAC.  Patient developed sudden onset of constipation with some right abdominal pain in May 2013. She saw GI physician in ~ Aug 2013, however was unable to tolerate prep for colonoscopy, rescheduled the procedure and was again unable to tolerate prep such that this was never accomplished. Constipation improved with probiotics and Activia, now soft formed stools several times daily with just Activia. She had been aware of abdominal fullness more recently, with early satiety and some reflux symptoms. She developed LLE superficial phlebitis in Jan 2014 (dopplers by PCP negative for DVT then), followed by swelling and discomfort in left medial thigh with venous doppler at [facility] in [facility] on 05-21-12 with acute thrombus left common femoral vein. 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 She was  begun on lovenox and transitioned to Xarelto by Dr Everlene Other, presently on xarelto 20 mg daily. CA 125 was 569 per Dr Denman George note. She was seen by Dr Duard Brady in [place] on 06-12-12, her exam remarkable for distended abdomen with fluid wave and 15 cm fixed mass in pelvis. As the inguinal nodes were not palpable and as she needed paracentesis for symptoms, an FNA of inguinal node was not done and she instead had US paracentesis by IR 06-12-12 for 2.8 liters of yellow fluid, cytology ( NZB14 - 126) with adenocarcinoma. Neoadjuvant dose dense carbo/taxol was beun 06-21-12.  Patient was unable to sleep after premedication decadron 20 m 12 hrs and 6 hrs prior to taxol, but had no reactions during that infusion. Peripheral IV access was easily accomplished by second RN who attempted and we have discussed good hydration prior to IV access. She had 2 episodes of vomiting, one of which was ~ an hour after oral xarelto. She was very constipated, resolved with miralax. She had more symptomatic GERD, stopped TUMS, has tried OTC zantac and will increase this either to 2 zantac daily or begin protonix 40 m daily. She has been able to eat and drink fluids. She finds it easier to take deep breath. She has had no fever or symptoms of infection, no bleeding, no increased SOB, no chest pain. She  Remainder of 10 point Review of Systems negative.  Objective:  Vital signs in last 24 hours:  BP 103/60  Pulse 63  Temp(Src) 97.9 F (36.6 C) (Oral)  Resp 20  Ht 5\' 7"  (1.702 m)  Wt 144 lb 14.4 oz (  65.726 kg)  BMI 22.69 kg/m2 Easily ambulatory, respirations not labored RA, looks comfortable. No alopecia.   HEENT:PERRLA, sclera clear, anicteric and oropharynx clear, no lesions LymphaticsCervical, supraclavicular, and axillary nodes normal. Resp: somewhat diminished BS thruout otherwise clear to A&P Cardio: regular rate and rhythm GI: full, soft, no increased distension, some BS, not tender epiastrium, no clear  HSM Extremities: no pitting edema, cords, tenderness LE. Varicosities. Neuro:no sensory deficits noted Skin without irritation at previous IV site RUE or attempted IV sites LUE. No ecchymoses or petechiae   Lab Results:  Results for orders placed in visit on 06/26/12  CBC WITH DIFFERENTIAL      Result Value Range   WBC 5.3  3.9 - 10.3 10e3/uL   NEUT# 4.1  1.5 - 6.5 10e3/uL   HGB 11.5 (*) 11.6 - 15.9 g/dL   HCT 82.9 (*) 56.2 - 13.0 %   Platelets 295  145 - 400 10e3/uL   MCV 82.1  79.5 - 101.0 fL   MCH 27.2  25.1 - 34.0 pg   MCHC 33.2  31.5 - 36.0 g/dL   RBC 8.65  7.84 - 6.96 10e6/uL   RDW 16.2 (*) 11.2 - 14.5 %   lymph# 0.8 (*) 0.9 - 3.3 10e3/uL   MONO# 0.3  0.1 - 0.9 10e3/uL   Eosinophils Absolute 0.1  0.0 - 0.5 10e3/uL   Basophils Absolute 0.0  0.0 - 0.1 10e3/uL   NEUT% 78.1 (*) 38.4 - 76.8 %   LYMPH% 14.8  14.0 - 49.7 %   MONO% 4.9  0.0 - 14.0 %   EOS% 1.4  0.0 - 7.0 %   BASO% 0.8  0.0 - 2.0 %    CMET available after visit normal with exception of glucose 101 and albumin 2.8. Creat 0.7  Studies/Results: Pathology immunohistochemical stains added to cytology from 06-12-12, which I discussed with Dr Luisa Hart. These are consistent with gyn primary, likely ovarian. I have discussed with patient now and have given her a copy of that pathology information.  CXR report from [facility] in [place]1-29-14 NAD,  scanned into this EMR. Discussed with patient now.  Medications: I have reviewed the patient's current medications. She will continue H2 blocker and miralax. She has discussed interactions with xarelto with her pharmacist. She will continue to hold probiotic. She will decrease oral premedication decadron to 20 mg 12 hrs prior to taxol. She will hold ASA on the xarelto and with possibility of thrombocytopenia related to chemo. Written and oral instructions for meds given.  Assessment/Plan:  1. 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 I am watching patient and labs closely as we begin particularly with anticoagulation for extensive DVT. She will have day 8 cycle 1 on 06-28-12 and I will see her with day 15 treatment on 07-05-12. Expect to repeat scans and see Dr Duard Brady after cycle 3.  2.Change in bowel habits in past year, unable to tolerate prep for colonoscopy. Bowels moving with miralax. CEA not elevated.  3.long tobacco recently discontinued.  baseline CXR at outside facility as above,  expect to get full CT chest with reimaging scans (no CT angio chest done to date) 4.extensive DVT involving IVC, pelvic veins and left common femoral, as well as superficial phlebitis LLE: continuing xarelto. Will need IVC filter prior to surgery. Hopefully platelets will maintain with chemo, carbo dose decreased to AUC 4 for this first cycle.  5.overdue mammograms: no obvious significant concerns on PE  6.ruptured appendix remotely  7.basal cell carcinomas  8. Pneumovax given  9.Advance Directives and HCPOA done  Patient is appreciative of care and in agreement with plan above.   LIVESAY,LENNIS P, MD   06/26/2012, 9:04 AM

## 2012-06-27 ENCOUNTER — Other Ambulatory Visit: Payer: Self-pay

## 2012-06-27 DIAGNOSIS — C569 Malignant neoplasm of unspecified ovary: Secondary | ICD-10-CM

## 2012-06-28 ENCOUNTER — Ambulatory Visit (HOSPITAL_BASED_OUTPATIENT_CLINIC_OR_DEPARTMENT_OTHER): Payer: Medicare Other

## 2012-06-28 ENCOUNTER — Other Ambulatory Visit (HOSPITAL_BASED_OUTPATIENT_CLINIC_OR_DEPARTMENT_OTHER): Payer: Medicare Other | Admitting: Lab

## 2012-06-28 VITALS — BP 111/53 | HR 63 | Temp 97.7°F | Resp 18

## 2012-06-28 DIAGNOSIS — C786 Secondary malignant neoplasm of retroperitoneum and peritoneum: Secondary | ICD-10-CM

## 2012-06-28 DIAGNOSIS — C801 Malignant (primary) neoplasm, unspecified: Secondary | ICD-10-CM

## 2012-06-28 DIAGNOSIS — C569 Malignant neoplasm of unspecified ovary: Secondary | ICD-10-CM

## 2012-06-28 DIAGNOSIS — Z5111 Encounter for antineoplastic chemotherapy: Secondary | ICD-10-CM

## 2012-06-28 LAB — CBC WITH DIFFERENTIAL/PLATELET
BASO%: 0.4 % (ref 0.0–2.0)
Basophils Absolute: 0 10*3/uL (ref 0.0–0.1)
EOS%: 0.1 % (ref 0.0–7.0)
HGB: 11.2 g/dL — ABNORMAL LOW (ref 11.6–15.9)
MCH: 27.6 pg (ref 25.1–34.0)
MCHC: 33.2 g/dL (ref 31.5–36.0)
MCV: 83.1 fL (ref 79.5–101.0)
MONO%: 4.3 % (ref 0.0–14.0)
NEUT%: 81.4 % — ABNORMAL HIGH (ref 38.4–76.8)
RDW: 16.2 % — ABNORMAL HIGH (ref 11.2–14.5)

## 2012-06-28 MED ORDER — ONDANSETRON 8 MG/50ML IVPB (CHCC)
8.0000 mg | Freq: Once | INTRAVENOUS | Status: AC
  Administered 2012-06-28: 8 mg via INTRAVENOUS

## 2012-06-28 MED ORDER — DEXAMETHASONE SODIUM PHOSPHATE 4 MG/ML IJ SOLN
20.0000 mg | Freq: Once | INTRAMUSCULAR | Status: AC
  Administered 2012-06-28: 20 mg via INTRAVENOUS

## 2012-06-28 MED ORDER — DIPHENHYDRAMINE HCL 50 MG/ML IJ SOLN
50.0000 mg | Freq: Once | INTRAMUSCULAR | Status: AC
  Administered 2012-06-28: 50 mg via INTRAVENOUS

## 2012-06-28 MED ORDER — PACLITAXEL CHEMO INJECTION 300 MG/50ML
80.0000 mg/m2 | Freq: Once | INTRAVENOUS | Status: AC
  Administered 2012-06-28: 144 mg via INTRAVENOUS
  Filled 2012-06-28: qty 24

## 2012-06-28 MED ORDER — FAMOTIDINE IN NACL 20-0.9 MG/50ML-% IV SOLN
20.0000 mg | Freq: Once | INTRAVENOUS | Status: AC
  Administered 2012-06-28: 20 mg via INTRAVENOUS

## 2012-06-28 MED ORDER — SODIUM CHLORIDE 0.9 % IV SOLN
Freq: Once | INTRAVENOUS | Status: AC
  Administered 2012-06-28: 12:00:00 via INTRAVENOUS

## 2012-06-28 NOTE — Patient Instructions (Addendum)
[facility] Discharge Instructions for Patients Receiving Chemotherapy  Today you received the following chemotherapy agents taxol  To help prevent nausea and vomiting after your treatment, we encourage you to take your nausea medication  and take it as often as prescribed   If you develop nausea and vomiting that is not controlled by your nausea medication, call the clinic. If it is after clinic hours your family physician or the after hours number for the clinic or go to the Emergency Department.   BELOW ARE SYMPTOMS THAT SHOULD BE REPORTED IMMEDIATELY:  *FEVER GREATER THAN 100.5 F  *CHILLS WITH OR WITHOUT FEVER  NAUSEA AND VOMITING THAT IS NOT CONTROLLED WITH YOUR NAUSEA MEDICATION  *UNUSUAL SHORTNESS OF BREATH  *UNUSUAL BRUISING OR BLEEDING  TENDERNESS IN MOUTH AND THROAT WITH OR WITHOUT PRESENCE OF ULCERS  *URINARY PROBLEMS  *BOWEL PROBLEMS  UNUSUAL RASH Items with * indicate a potential emergency and should be followed up as soon as possible.  One of the nurses will contact you 24 hours after your treatment. Please let the nurse know about any problems that you may have experienced. Feel free to call the clinic you have any questions or concerns. The clinic phone number is [phone].   I have been informed and understand all the instructions given to me. I know to contact the clinic, my physician, or go to the Emergency Department if any problems should occur. I do not have any questions at this time, but understand that I may call the clinic during office hours   should I have any questions or need assistance in obtaining follow up care.    __________________________________________  _____________  __________ Signature of Patient or Authorized Representative            Date                   Time    __________________________________________ Nurse's Signature    

## 2012-06-30 ENCOUNTER — Other Ambulatory Visit: Payer: Self-pay | Admitting: Oncology

## 2012-07-03 ENCOUNTER — Other Ambulatory Visit: Payer: Medicare Other | Admitting: Lab

## 2012-07-05 ENCOUNTER — Ambulatory Visit (HOSPITAL_BASED_OUTPATIENT_CLINIC_OR_DEPARTMENT_OTHER): Payer: Medicare Other

## 2012-07-05 ENCOUNTER — Ambulatory Visit (HOSPITAL_BASED_OUTPATIENT_CLINIC_OR_DEPARTMENT_OTHER): Payer: Medicare Other | Admitting: Oncology

## 2012-07-05 ENCOUNTER — Encounter: Payer: Self-pay | Admitting: Oncology

## 2012-07-05 ENCOUNTER — Telehealth: Payer: Self-pay | Admitting: Oncology

## 2012-07-05 ENCOUNTER — Other Ambulatory Visit (HOSPITAL_BASED_OUTPATIENT_CLINIC_OR_DEPARTMENT_OTHER): Payer: Medicare Other | Admitting: Lab

## 2012-07-05 VITALS — BP 126/66 | HR 97 | Temp 97.2°F | Resp 20 | Ht 67.0 in | Wt 147.0 lb

## 2012-07-05 DIAGNOSIS — C569 Malignant neoplasm of unspecified ovary: Secondary | ICD-10-CM

## 2012-07-05 DIAGNOSIS — C786 Secondary malignant neoplasm of retroperitoneum and peritoneum: Secondary | ICD-10-CM

## 2012-07-05 DIAGNOSIS — C801 Malignant (primary) neoplasm, unspecified: Secondary | ICD-10-CM

## 2012-07-05 DIAGNOSIS — C4491 Basal cell carcinoma of skin, unspecified: Secondary | ICD-10-CM

## 2012-07-05 DIAGNOSIS — I82619 Acute embolism and thrombosis of superficial veins of unspecified upper extremity: Secondary | ICD-10-CM

## 2012-07-05 DIAGNOSIS — Z5111 Encounter for antineoplastic chemotherapy: Secondary | ICD-10-CM

## 2012-07-05 LAB — CBC WITH DIFFERENTIAL/PLATELET
BASO%: 0 % (ref 0.0–2.0)
EOS%: 0 % (ref 0.0–7.0)
HCT: 34.1 % — ABNORMAL LOW (ref 34.8–46.6)
MCH: 26.6 pg (ref 25.1–34.0)
MCHC: 32 g/dL (ref 31.5–36.0)
NEUT%: 73.2 % (ref 38.4–76.8)
lymph#: 0.6 10*3/uL — ABNORMAL LOW (ref 0.9–3.3)

## 2012-07-05 MED ORDER — SODIUM CHLORIDE 0.9 % IV SOLN
Freq: Once | INTRAVENOUS | Status: AC
  Administered 2012-07-05: 11:00:00 via INTRAVENOUS

## 2012-07-05 MED ORDER — DIPHENHYDRAMINE HCL 50 MG/ML IJ SOLN
50.0000 mg | Freq: Once | INTRAMUSCULAR | Status: AC
  Administered 2012-07-05: 50 mg via INTRAVENOUS

## 2012-07-05 MED ORDER — DEXAMETHASONE SODIUM PHOSPHATE 4 MG/ML IJ SOLN
20.0000 mg | Freq: Once | INTRAMUSCULAR | Status: AC
  Administered 2012-07-05: 20 mg via INTRAVENOUS

## 2012-07-05 MED ORDER — FAMOTIDINE IN NACL 20-0.9 MG/50ML-% IV SOLN
20.0000 mg | Freq: Once | INTRAVENOUS | Status: AC
  Administered 2012-07-05: 20 mg via INTRAVENOUS

## 2012-07-05 MED ORDER — PACLITAXEL CHEMO INJECTION 300 MG/50ML
80.0000 mg/m2 | Freq: Once | INTRAVENOUS | Status: AC
  Administered 2012-07-05: 144 mg via INTRAVENOUS
  Filled 2012-07-05: qty 24

## 2012-07-05 MED ORDER — ONDANSETRON 8 MG/50ML IVPB (CHCC)
8.0000 mg | Freq: Once | INTRAVENOUS | Status: AC
  Administered 2012-07-05: 8 mg via INTRAVENOUS

## 2012-07-05 NOTE — Telephone Encounter (Signed)
gv an dprinted appt schedule for pt for March and April....printed tx for [facility]

## 2012-07-05 NOTE — Patient Instructions (Signed)
[facility] Discharge Instructions for Patients Receiving Chemotherapy  Today you received the following chemotherapy agent: Taxol  To help prevent nausea and vomiting after your treatment, we encourage you to take your nausea medications:  Ativan 0.5 mg tablet-1/2 to 1 tablet every 6 hours as needed  Zofran 8 mg tablet- 1 tablet every 12 hours as needed   If you develop nausea and vomiting that is not controlled by your nausea medication, call the clinic. If it is after clinic hours your family physician or the after hours number for the clinic or go to the Emergency Department.   BELOW ARE SYMPTOMS THAT SHOULD BE REPORTED IMMEDIATELY:  *FEVER GREATER THAN 100.5 F  *CHILLS WITH OR WITHOUT FEVER  NAUSEA AND VOMITING THAT IS NOT CONTROLLED WITH YOUR NAUSEA MEDICATION  *UNUSUAL SHORTNESS OF BREATH  *UNUSUAL BRUISING OR BLEEDING  TENDERNESS IN MOUTH AND THROAT WITH OR WITHOUT PRESENCE OF ULCERS  *URINARY PROBLEMS  *BOWEL PROBLEMS  UNUSUAL RASH Items with * indicate a potential emergency and should be followed up as soon as possible.  Feel free to call the clinic you have any questions or concerns. The clinic phone number is [phone].   I have been informed and understand all the instructions given to me. I know to contact the clinic, my physician, or go to the Emergency Department if any problems should occur. I do not have any questions at this time, but understand that I may call the clinic during office hours   should I have any questions or need assistance in obtaining follow up care.    __________________________________________  _____________  __________ Signature of Patient or Authorized Representative            Date                   Time    __________________________________________ Nurse's Signature

## 2012-07-05 NOTE — Progress Notes (Signed)
1020-OK to treat with CBC results from today per Dr. Darrold Span.

## 2012-07-05 NOTE — Progress Notes (Signed)
OFFICE PROGRESS NOTE   07/05/2012   Physicians:P.Lazaro Arms, MD (PCP [facility] [place] Elpidio Anis, Verdis Prime)   INTERVAL HISTORY:   Patient is seen, alone for visit, due day 15 cycle 1 dose dense taxol carboplatin today. She is tolerating chemotherapy well so far, with bowels now moving daily on SenokotS, some improvement in abdominal fullness, good improvement in GERD now on Protonix, no taxol aches or peripheral neuropathy and no nausea.Last paracentesis was for 2.8 liters on 06-12-12. She has not needed gCSF thus far and does not have central catheter. She will have repeat scans (including CT chest) and see Dr Duard Brady on 08-21-12, after cycle 3.  Patient developed sudden onset of constipation with some right abdominal pain in May 2013. She saw GI physician in ~ aug 2013, however was unable to tolerate prep for colonoscopy due to vomiting initially, then rescheduled the procedure and was again unable to tolerate prep such that this was never accomplished. Constipation improved with probiotics and Activia, now soft formed stools several times daily with just Activia. She had been aware of abdominal fullness more recently, with early satiety and some reflux symptoms. She developed LLE superficial phlebitis in Jan 2014 (dopplers by PCP negative for DVT then), followed by swelling and discomfort in left medial thigh with venous doppler at [facility] in [facility] on 05-21-12 with acute thrombus left common femoral vein. 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 She was begun on lovenox and transitioned to Xarelto by Dr Everlene Other, presently on xarelto 20 mg daily. CA 125 was 569 per Dr Denman George note. She was seen by Dr Duard Brady in [place] on 06-12-12, her exam remarkable for distended abdomen with fluid wave and 15 cm fixed mass in pelvis. As the inguinal nodes were not palpable and as she needed paracentesis for symptoms, an FNA of inguinal node was not done and she instead had US paracentesis by IR 06-12-12 for 2.8 liters of yellow fluid, cytology (NZB14 - 126) with adenocarcinoma. Cycle 1 day 1 dose dense caarboplatin/ taxol was 06-21-12.  ROS as above. Legs were briefly tingling after IV benadryl premed.No fever or symptoms of infection. No pain. Abdomen is still significantly more full than her usual. Minimal swelling LLE. No SOB or chest pain. Remainder of 10 point Review of Systems negative.  Objective:  Vital signs in last 24 hours:  BP 126/66  Pulse 97  Temp(Src) 97.2 F (36.2 C) (Oral)  Resp 20  Ht 5\' 7"  (1.702 m)  Wt 147 lb (66.679 kg)  BMI 23.02 kg/m2 Weight is up 2 lbs.Easily ambulatory, respirations not labored RA. Looks comfortable.   HEENT:PERRLA, sclera clear, anicteric, oropharynx clear, no lesions and neck supple with midline trachea. No alopecia LymphaticsCervical, supraclavicular, and axillary nodes normal. Resp: clear to auscultation bilaterally and normal percussion bilaterally, diminished BS in bases only. Cardio: regular rate and rhythm  GI: full especially in upper quadrants, not tight. Few bowel sounds. Not tender Extremities: trace pedal edema on left LE otherwise no edema, cords, tenderness Neuro:no sensory deficits noted Skin without rash or ecchymosis  Lab Results:  Results for orders placed in visit on 07/05/12  CBC  WITH DIFFERENTIAL      Result Value Range   WBC 2.3 (*) 3.9 - 10.3 10e3/uL   NEUT# 1.7  1.5 - 6.5 10e3/uL   HGB 10.9 (*) 11.6 - 15.9 g/dL   HCT 52.8 (*) 41.3 - 24.4 %   Platelets 279  145 - 400 10e3/uL   MCV 83.2  79.5 - 101.0 fL   MCH 26.6  25.1 - 34.0 pg   MCHC 32.0  31.5 - 36.0 g/dL   RBC 0.10  2.72 - 5.36 10e6/uL   RDW 17.0 (*) 11.2 - 14.5 %   lymph# 0.6 (*) 0.9 - 3.3 10e3/uL   MONO# 0.1  0.1 - 0.9 10e3/uL   Eosinophils Absolute 0.0  0.0 - 0.5 10e3/uL   Basophils Absolute 0.0  0.0 - 0.1 10e3/uL   NEUT% 73.2  38.4 - 76.8 %   LYMPH% 24.6  14.0 - 49.7 %   MONO% 2.2  0.0 - 14.0 %   EOS% 0.0  0.0 - 7.0 %   BASO% 0.0  0.0 - 2.0 %   nRBC 0  0 - 0 %     Studies/Results:  No results found.  Medications: I have reviewed the patient's current medications. Discussed continuing protonix daily. She forgot to take xarelto at 0900 today, ok to take that when she gets home per [facility] pharmacist. DIscussed possible need for addition of gCSF depending on counts.  Assessment/Plan  1. 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 Expect to repeat CT ap + CT chest and see Dr Duard Brady after cycle 3, set up for 08-21-12.  2.Change in bowel habits in past year, unable to tolerate prep for colonoscopy. Bowels moving with miralax. CEA not elevated.  3.long tobacco recently discontinued. baseline CXR at outside facility as above, expect to get full CT chest with reimaging scans (no CT angio chest done to date)  4.extensive DVT involving IVC, pelvic veins and left common femoral, as well as superficial phlebitis LLE: continuing xarelto. Will need IVC filter prior to surgery. Hopefully platelets will maintain with chemo, carbo dose decreased to AUC 4 for this first cycle.  5.overdue mammograms: no obvious significant concerns on PE  6.ruptured appendix remotely  7.basal cell carcinomas  8.  Pneumovax given  9.Advance Directives and HCPOA done  I will see her back with cbc,cmet,ca125 on 3-19 prior to day 1 cycle 2 on 07-12-12. Will decide based on those labs if [place] will stay at AUC=4 for day 1 cycle 2 due 07-12-12.  Patient was in agreement with plan and pleased with how well treatment has gone thus far.   LIVESAY,LENNIS P, MD   07/05/2012, 9:25 AM

## 2012-07-05 NOTE — Patient Instructions (Signed)
Continue Protonix daily  Appointments as scheduled

## 2012-07-08 ENCOUNTER — Other Ambulatory Visit: Payer: Self-pay | Admitting: Oncology

## 2012-07-10 ENCOUNTER — Ambulatory Visit (HOSPITAL_BASED_OUTPATIENT_CLINIC_OR_DEPARTMENT_OTHER): Payer: Medicare Other | Admitting: Oncology

## 2012-07-10 ENCOUNTER — Other Ambulatory Visit (HOSPITAL_BASED_OUTPATIENT_CLINIC_OR_DEPARTMENT_OTHER): Payer: Medicare Other | Admitting: Lab

## 2012-07-10 ENCOUNTER — Other Ambulatory Visit: Payer: Self-pay | Admitting: Oncology

## 2012-07-10 ENCOUNTER — Telehealth: Payer: Self-pay | Admitting: Oncology

## 2012-07-10 ENCOUNTER — Encounter: Payer: Self-pay | Admitting: Oncology

## 2012-07-10 VITALS — BP 127/67 | HR 68 | Temp 97.2°F | Resp 20 | Ht 67.0 in | Wt 142.8 lb

## 2012-07-10 DIAGNOSIS — C786 Secondary malignant neoplasm of retroperitoneum and peritoneum: Secondary | ICD-10-CM

## 2012-07-10 DIAGNOSIS — C569 Malignant neoplasm of unspecified ovary: Secondary | ICD-10-CM

## 2012-07-10 DIAGNOSIS — C801 Malignant (primary) neoplasm, unspecified: Secondary | ICD-10-CM

## 2012-07-10 DIAGNOSIS — I82619 Acute embolism and thrombosis of superficial veins of unspecified upper extremity: Secondary | ICD-10-CM

## 2012-07-10 DIAGNOSIS — Z87891 Personal history of nicotine dependence: Secondary | ICD-10-CM

## 2012-07-10 LAB — COMPREHENSIVE METABOLIC PANEL (CC13)
ALT: 19 U/L (ref 0–55)
AST: 27 U/L (ref 5–34)
CO2: 26 mEq/L (ref 22–29)
Calcium: 9.6 mg/dL (ref 8.4–10.4)
Chloride: 104 mEq/L (ref 98–107)
Sodium: 139 mEq/L (ref 136–145)
Total Protein: 7.6 g/dL (ref 6.4–8.3)

## 2012-07-10 LAB — CBC WITH DIFFERENTIAL/PLATELET
Eosinophils Absolute: 0 10*3/uL (ref 0.0–0.5)
MONO#: 0.2 10*3/uL (ref 0.1–0.9)
NEUT#: 2.3 10*3/uL (ref 1.5–6.5)
RBC: 4.14 10*6/uL (ref 3.70–5.45)
RDW: 17.5 % — ABNORMAL HIGH (ref 11.2–14.5)
WBC: 3.5 10*3/uL — ABNORMAL LOW (ref 3.9–10.3)
lymph#: 0.9 10*3/uL (ref 0.9–3.3)
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
## 2012-07-11 LAB — CA 125: CA 125: 539.7 U/mL — ABNORMAL HIGH (ref 0.0–30.2)

## 2012-07-12 ENCOUNTER — Other Ambulatory Visit (HOSPITAL_BASED_OUTPATIENT_CLINIC_OR_DEPARTMENT_OTHER): Payer: Medicare Other | Admitting: Lab

## 2012-07-12 ENCOUNTER — Other Ambulatory Visit: Payer: Self-pay | Admitting: Oncology

## 2012-07-12 ENCOUNTER — Ambulatory Visit (HOSPITAL_BASED_OUTPATIENT_CLINIC_OR_DEPARTMENT_OTHER): Payer: Medicare Other

## 2012-07-12 VITALS — BP 115/65 | HR 67 | Temp 96.9°F | Resp 17

## 2012-07-12 DIAGNOSIS — C801 Malignant (primary) neoplasm, unspecified: Secondary | ICD-10-CM

## 2012-07-12 DIAGNOSIS — C569 Malignant neoplasm of unspecified ovary: Secondary | ICD-10-CM

## 2012-07-12 DIAGNOSIS — Z5111 Encounter for antineoplastic chemotherapy: Secondary | ICD-10-CM

## 2012-07-12 DIAGNOSIS — C786 Secondary malignant neoplasm of retroperitoneum and peritoneum: Secondary | ICD-10-CM

## 2012-07-12 LAB — CBC WITH DIFFERENTIAL/PLATELET
BASO%: 0.4 % (ref 0.0–2.0)
Eosinophils Absolute: 0 10*3/uL (ref 0.0–0.5)
HCT: 35.1 % (ref 34.8–46.6)
MCHC: 32.2 g/dL (ref 31.5–36.0)
MONO#: 0.1 10*3/uL (ref 0.1–0.9)
NEUT#: 2 10*3/uL (ref 1.5–6.5)
NEUT%: 71 % (ref 38.4–76.8)
WBC: 2.8 10*3/uL — ABNORMAL LOW (ref 3.9–10.3)
lymph#: 0.8 10*3/uL — ABNORMAL LOW (ref 0.9–3.3)
nRBC: 0 % (ref 0–0)

## 2012-07-12 MED ORDER — FAMOTIDINE IN NACL 20-0.9 MG/50ML-% IV SOLN
20.0000 mg | Freq: Once | INTRAVENOUS | Status: AC
  Administered 2012-07-12: 20 mg via INTRAVENOUS

## 2012-07-12 MED ORDER — SODIUM CHLORIDE 0.9 % IV SOLN
546.0000 mg | Freq: Once | INTRAVENOUS | Status: AC
  Administered 2012-07-12: 550 mg via INTRAVENOUS
  Filled 2012-07-12: qty 55

## 2012-07-12 MED ORDER — SODIUM CHLORIDE 0.9 % IV SOLN
Freq: Once | INTRAVENOUS | Status: AC
  Administered 2012-07-12: 11:00:00 via INTRAVENOUS

## 2012-07-12 MED ORDER — DEXAMETHASONE SODIUM PHOSPHATE 4 MG/ML IJ SOLN
20.0000 mg | Freq: Once | INTRAMUSCULAR | Status: AC
  Administered 2012-07-12: 20 mg via INTRAVENOUS

## 2012-07-12 MED ORDER — DIPHENHYDRAMINE HCL 50 MG/ML IJ SOLN
50.0000 mg | Freq: Once | INTRAMUSCULAR | Status: AC
  Administered 2012-07-12: 50 mg via INTRAVENOUS

## 2012-07-12 MED ORDER — ONDANSETRON 16 MG/50ML IVPB (CHCC)
16.0000 mg | Freq: Once | INTRAVENOUS | Status: AC
  Administered 2012-07-12: 16 mg via INTRAVENOUS

## 2012-07-12 MED ORDER — SODIUM CHLORIDE 0.9 % IV SOLN
80.0000 mg/m2 | Freq: Once | INTRAVENOUS | Status: AC
  Administered 2012-07-12: 144 mg via INTRAVENOUS
  Filled 2012-07-12: qty 24

## 2012-07-12 NOTE — Progress Notes (Signed)
1020-OK to treat with CBC results from today per Dr. Darrold Span.  Pt without complaints at this time.

## 2012-07-12 NOTE — Patient Instructions (Addendum)
[facility] Discharge Instructions for Patients Receiving Chemotherapy  Today you received the following chemotherapy agents Taxol and Carboplatin.  To help prevent nausea and vomiting after your treatment, we encourage you to take your nausea medication as ordered per MD.    If you develop nausea and vomiting that is not controlled by your nausea medication, call the clinic. If it is after clinic hours your family physician or the after hours number for the clinic or go to the Emergency Department.   BELOW ARE SYMPTOMS THAT SHOULD BE REPORTED IMMEDIATELY:  *FEVER GREATER THAN 100.5 F  *CHILLS WITH OR WITHOUT FEVER  NAUSEA AND VOMITING THAT IS NOT CONTROLLED WITH YOUR NAUSEA MEDICATION  *UNUSUAL SHORTNESS OF BREATH  *UNUSUAL BRUISING OR BLEEDING  TENDERNESS IN MOUTH AND THROAT WITH OR WITHOUT PRESENCE OF ULCERS  *URINARY PROBLEMS  *BOWEL PROBLEMS  UNUSUAL RASH Items with * indicate a potential emergency and should be followed up as soon as possible.   Please let the nurse know about any problems that you may have experienced. Feel free to call the clinic you have any questions or concerns. The clinic phone number is [phone].   I have been informed and understand all the instructions given to me. I know to contact the clinic, my physician, or go to the Emergency Department if any problems should occur. I do not have any questions at this time, but understand that I may call the clinic during office hours   should I have any questions or need assistance in obtaining follow up care.    __________________________________________  _____________  __________ Signature of Patient or Authorized Representative            Date                   Time    __________________________________________ Nurse's Signature    

## 2012-07-15 ENCOUNTER — Telehealth: Payer: Self-pay

## 2012-07-15 NOTE — Telephone Encounter (Signed)
Told Ms. Cossin labs results as noted below by Dr. Darrold Span and she was very pleased.

## 2012-07-15 NOTE — Telephone Encounter (Signed)
Message copied by Lorine Bears on Mon Jul 15, 2012  4:00 PM ------      Message from: Reece Packer      Created: Fri Jul 12, 2012  8:39 PM       Labs seen and need follow up; please let her know the ca125 marker has decreased almost in half, to 539, which is very encouraging. ------

## 2012-07-17 ENCOUNTER — Other Ambulatory Visit: Payer: Medicare Other | Admitting: Lab

## 2012-07-19 ENCOUNTER — Ambulatory Visit (HOSPITAL_BASED_OUTPATIENT_CLINIC_OR_DEPARTMENT_OTHER): Payer: Medicare Other

## 2012-07-19 ENCOUNTER — Other Ambulatory Visit (HOSPITAL_BASED_OUTPATIENT_CLINIC_OR_DEPARTMENT_OTHER): Payer: Medicare Other | Admitting: Lab

## 2012-07-19 VITALS — BP 124/61 | HR 66 | Temp 97.1°F | Resp 16

## 2012-07-19 DIAGNOSIS — Z5111 Encounter for antineoplastic chemotherapy: Secondary | ICD-10-CM

## 2012-07-19 DIAGNOSIS — C569 Malignant neoplasm of unspecified ovary: Secondary | ICD-10-CM

## 2012-07-19 LAB — CBC WITH DIFFERENTIAL/PLATELET
Basophils Absolute: 0 10*3/uL (ref 0.0–0.1)
EOS%: 0 % (ref 0.0–7.0)
HCT: 34.4 % — ABNORMAL LOW (ref 34.8–46.6)
HGB: 11.3 g/dL — ABNORMAL LOW (ref 11.6–15.9)
MCH: 27.2 pg (ref 25.1–34.0)
MCV: 82.7 fL (ref 79.5–101.0)
MONO%: 2.9 % (ref 0.0–14.0)
NEUT%: 73.2 % (ref 38.4–76.8)
Platelets: 177 10*3/uL (ref 145–400)
lymph#: 0.6 10*3/uL — ABNORMAL LOW (ref 0.9–3.3)

## 2012-07-19 MED ORDER — SODIUM CHLORIDE 0.9 % IV SOLN
Freq: Once | INTRAVENOUS | Status: AC
  Administered 2012-07-19: 11:00:00 via INTRAVENOUS

## 2012-07-19 MED ORDER — SODIUM CHLORIDE 0.9 % IV SOLN
80.0000 mg/m2 | Freq: Once | INTRAVENOUS | Status: AC
  Administered 2012-07-19: 144 mg via INTRAVENOUS
  Filled 2012-07-19: qty 24

## 2012-07-19 MED ORDER — DIPHENHYDRAMINE HCL 50 MG/ML IJ SOLN
50.0000 mg | Freq: Once | INTRAMUSCULAR | Status: AC
  Administered 2012-07-19: 50 mg via INTRAVENOUS

## 2012-07-19 MED ORDER — DEXAMETHASONE SODIUM PHOSPHATE 4 MG/ML IJ SOLN
20.0000 mg | Freq: Once | INTRAMUSCULAR | Status: AC
  Administered 2012-07-19: 20 mg via INTRAVENOUS

## 2012-07-19 MED ORDER — FAMOTIDINE IN NACL 20-0.9 MG/50ML-% IV SOLN
20.0000 mg | Freq: Once | INTRAVENOUS | Status: AC
  Administered 2012-07-19: 20 mg via INTRAVENOUS

## 2012-07-19 MED ORDER — ONDANSETRON 8 MG/50ML IVPB (CHCC)
8.0000 mg | Freq: Once | INTRAVENOUS | Status: AC
  Administered 2012-07-19: 8 mg via INTRAVENOUS

## 2012-07-19 NOTE — Patient Instructions (Addendum)
 [facility] Discharge [facility]ructions for Patients Receiving Chemotherapy  Today you received the following chemotherapy agents taxol  To help prevent nausea and vomiting after your treatment, we encourage you to take your nausea medication as directed by Dr Darrold Span Begin taking it at 5 pm if needed.    If you develop nausea and vomiting that is not controlled by your nausea medication, call the clinic. If it is after clinic hours your family physician or the after hours number for the clinic or go to the Emergency Department.   BELOW ARE SYMPTOMS THAT SHOULD BE REPORTED IMMEDIATELY:  *FEVER GREATER THAN 100.5 F  *CHILLS WITH OR WITHOUT FEVER  NAUSEA AND VOMITING THAT IS NOT CONTROLLED WITH YOUR NAUSEA MEDICATION  *UNUSUAL SHORTNESS OF BREATH  *UNUSUAL BRUISING OR BLEEDING  TENDERNESS IN MOUTH AND THROAT WITH OR WITHOUT PRESENCE OF ULCERS  *URINARY PROBLEMS  *BOWEL PROBLEMS  UNUSUAL RASH Items with * indicate a potential emergency and should be followed up as soon as possible.   Feel free to call the clinic you have any questions or concerns. The clinic phone number is [phone].   I have been informed and understand all the instructions given to me. I know to contact the clinic, my physician, or go to the Emergency Department if any problems should occur. I do not have any questions at this time, but understand that I may call the clinic during office hours   should I have any questions or need assistance in obtaining follow up care.    __________________________________________  _____________  __________ Signature of Patient or Authorized Representative            Date                   Time    __________________________________________ Nurse's Signature

## 2012-07-26 ENCOUNTER — Ambulatory Visit (HOSPITAL_BASED_OUTPATIENT_CLINIC_OR_DEPARTMENT_OTHER): Payer: Medicare Other

## 2012-07-26 ENCOUNTER — Other Ambulatory Visit (HOSPITAL_BASED_OUTPATIENT_CLINIC_OR_DEPARTMENT_OTHER): Payer: Medicare Other | Admitting: Lab

## 2012-07-26 VITALS — BP 121/67 | HR 64 | Temp 98.8°F | Resp 16

## 2012-07-26 DIAGNOSIS — C786 Secondary malignant neoplasm of retroperitoneum and peritoneum: Secondary | ICD-10-CM

## 2012-07-26 DIAGNOSIS — C801 Malignant (primary) neoplasm, unspecified: Secondary | ICD-10-CM

## 2012-07-26 DIAGNOSIS — Z5111 Encounter for antineoplastic chemotherapy: Secondary | ICD-10-CM

## 2012-07-26 DIAGNOSIS — C569 Malignant neoplasm of unspecified ovary: Secondary | ICD-10-CM

## 2012-07-26 LAB — CBC WITH DIFFERENTIAL/PLATELET
Eosinophils Absolute: 0 10*3/uL (ref 0.0–0.5)
LYMPH%: 30.8 % (ref 14.0–49.7)
MCV: 83.2 fL (ref 79.5–101.0)
MONO%: 2.3 % (ref 0.0–14.0)
NEUT#: 1.2 10*3/uL — ABNORMAL LOW (ref 1.5–6.5)
Platelets: 247 10*3/uL (ref 145–400)
RBC: 3.94 10*6/uL (ref 3.70–5.45)
nRBC: 0 % (ref 0–0)

## 2012-07-26 MED ORDER — ONDANSETRON 8 MG/50ML IVPB (CHCC)
8.0000 mg | Freq: Once | INTRAVENOUS | Status: AC
  Administered 2012-07-26: 8 mg via INTRAVENOUS

## 2012-07-26 MED ORDER — SODIUM CHLORIDE 0.9 % IV SOLN
Freq: Once | INTRAVENOUS | Status: AC
  Administered 2012-07-26: 11:00:00 via INTRAVENOUS

## 2012-07-26 MED ORDER — SODIUM CHLORIDE 0.9 % IV SOLN
80.0000 mg/m2 | Freq: Once | INTRAVENOUS | Status: AC
  Administered 2012-07-26: 144 mg via INTRAVENOUS
  Filled 2012-07-26: qty 24

## 2012-07-26 MED ORDER — DIPHENHYDRAMINE HCL 50 MG/ML IJ SOLN
50.0000 mg | Freq: Once | INTRAMUSCULAR | Status: AC
  Administered 2012-07-26: 50 mg via INTRAVENOUS

## 2012-07-26 MED ORDER — DEXAMETHASONE SODIUM PHOSPHATE 4 MG/ML IJ SOLN
20.0000 mg | Freq: Once | INTRAMUSCULAR | Status: AC
  Administered 2012-07-26: 20 mg via INTRAVENOUS

## 2012-07-26 MED ORDER — FAMOTIDINE IN NACL 20-0.9 MG/50ML-% IV SOLN
20.0000 mg | Freq: Once | INTRAVENOUS | Status: AC
  Administered 2012-07-26: 20 mg via INTRAVENOUS

## 2012-07-26 NOTE — Progress Notes (Signed)
Discharged at 1245.  Left alone, ambulatory in no distress.

## 2012-07-26 NOTE — Patient Instructions (Signed)
[facility] Discharge Instructions for Patients Receiving Chemotherapy  Today you received the following chemotherapy agents Taxol  To help prevent nausea and vomiting after your treatment, we encourage you to take your nausea medication Zofran 8mg , lorazepam 0.5 mg.  Begin taking zofran 6:30pm and the lorazepam at anytime upon discharge and take it as often as prescribed by Dr. Darrold Span for the next 72 hours.   If you develop nausea and vomiting that is not controlled by your nausea medication, call the clinic. If it is after clinic hours your family physician or the after hours number for the clinic or go to the Emergency Department.   BELOW ARE SYMPTOMS THAT SHOULD BE REPORTED IMMEDIATELY:  *FEVER GREATER THAN 100.5 F  *CHILLS WITH OR WITHOUT FEVER  NAUSEA AND VOMITING THAT IS NOT CONTROLLED WITH YOUR NAUSEA MEDICATION  *UNUSUAL SHORTNESS OF BREATH  *UNUSUAL BRUISING OR BLEEDING  TENDERNESS IN MOUTH AND THROAT WITH OR WITHOUT PRESENCE OF ULCERS  *URINARY PROBLEMS  *BOWEL PROBLEMS  UNUSUAL RASH Items with * indicate a potential emergency and should be followed up as soon as possible.  Please call to let a nurse know about any problems that you may have experienced. Feel free to call the clinic you have any questions or concerns. The clinic phone number is [phone].   I have been informed and understand all the instructions given to me. I know to contact the clinic, my physician, or go to the Emergency Department if any problems should occur. I do not have any questions at this time, but understand that I may call the clinic during office hours   should I have any questions or need assistance in obtaining follow up care.    __________________________________________  _____________  __________ Signature of Patient or Authorized Representative            Date                   Time    __________________________________________ Nurse's Signature

## 2012-07-28 ENCOUNTER — Other Ambulatory Visit: Payer: Self-pay | Admitting: Oncology

## 2012-07-29 ENCOUNTER — Other Ambulatory Visit: Payer: Self-pay | Admitting: Oncology

## 2012-07-31 ENCOUNTER — Ambulatory Visit (HOSPITAL_BASED_OUTPATIENT_CLINIC_OR_DEPARTMENT_OTHER): Payer: Medicare Other | Admitting: Oncology

## 2012-07-31 ENCOUNTER — Other Ambulatory Visit (HOSPITAL_BASED_OUTPATIENT_CLINIC_OR_DEPARTMENT_OTHER): Payer: Medicare Other | Admitting: Lab

## 2012-07-31 ENCOUNTER — Encounter: Payer: Self-pay | Admitting: Oncology

## 2012-07-31 ENCOUNTER — Telehealth: Payer: Self-pay | Admitting: Oncology

## 2012-07-31 ENCOUNTER — Telehealth: Payer: Self-pay | Admitting: *Deleted

## 2012-07-31 VITALS — BP 121/70 | HR 70 | Temp 97.8°F | Resp 18 | Ht 67.0 in | Wt 139.0 lb

## 2012-07-31 DIAGNOSIS — C786 Secondary malignant neoplasm of retroperitoneum and peritoneum: Secondary | ICD-10-CM

## 2012-07-31 DIAGNOSIS — C569 Malignant neoplasm of unspecified ovary: Secondary | ICD-10-CM

## 2012-07-31 DIAGNOSIS — C801 Malignant (primary) neoplasm, unspecified: Secondary | ICD-10-CM

## 2012-07-31 DIAGNOSIS — I82409 Acute embolism and thrombosis of unspecified deep veins of unspecified lower extremity: Secondary | ICD-10-CM

## 2012-07-31 LAB — COMPREHENSIVE METABOLIC PANEL (CC13)
AST: 24 U/L (ref 5–34)
Albumin: 3.2 g/dL — ABNORMAL LOW (ref 3.5–5.0)
Alkaline Phosphatase: 113 U/L (ref 40–150)
Potassium: 4.2 mEq/L (ref 3.5–5.1)
Sodium: 138 mEq/L (ref 136–145)
Total Bilirubin: 0.41 mg/dL (ref 0.20–1.20)
Total Protein: 7.4 g/dL (ref 6.4–8.3)

## 2012-07-31 LAB — CBC WITH DIFFERENTIAL/PLATELET
BASO%: 0.7 % (ref 0.0–2.0)
EOS%: 0.4 % (ref 0.0–7.0)
HGB: 11.3 g/dL — ABNORMAL LOW (ref 11.6–15.9)
MCH: 27.6 pg (ref 25.1–34.0)
MCHC: 32.8 g/dL (ref 31.5–36.0)
MONO%: 4.2 % (ref 0.0–14.0)
RBC: 4.1 10*6/uL (ref 3.70–5.45)
RDW: 18.9 % — ABNORMAL HIGH (ref 11.2–14.5)
lymph#: 1 10*3/uL (ref 0.9–3.3)

## 2012-07-31 LAB — CA 125: CA 125: 188.4 U/mL — ABNORMAL HIGH (ref 0.0–30.2)

## 2012-07-31 NOTE — Telephone Encounter (Signed)
Called pt and left message to get May 2014 calendar, chemo lab and MD

## 2012-07-31 NOTE — Progress Notes (Signed)
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 She is tolerating the chemotherapy very well and clinically is continuing to improve. She is on xarelto for DVT but does not have IVC filter yet; she does not have PAC.  She has not needed gCSF as yet, tho WBC/ANC has been somewhat lower with this cycle, with platelets maintaining well. Carbo was given at AUC=4 for cycle 1, then increased to AUC=5 for cycle 2.  Oncologic History Patient developed constipation with some right abdominal pain in May 2013. She saw GI physician in ~ Aug 2013, but was unable to tolerate prep for colonoscopy such that this was never accomplished. Constipation improved with probiotics and Activia. She had abdominal fullness more recently, with early satiety and some reflux symptoms. She developed LLE superficial phlebitis in Jan 2014 (dopplers by PCP negative for DVT then), followed by swelling and discomfort in left medial thigh with venous doppler at [facility] in [facility] on 05-21-12 with acute thrombus left common femoral vein. 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 She was begun on lovenox and transitioned to Xarelto by Dr Everlene Other, presently on xarelto 20 mg daily. CA 125 was 569 initially. She saw Dr Duard Brady on 06-12-12, exam remarkable for distended abdomen with fluid wave and 15 cm fixed mass in pelvis. 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 Neoadjuvant dose dense carbo/taxol was beun 06-21-12.   Patient has had no nausea and has not needed antiemetics other than ativan at hs night of chemo. She is more fatigued on ~ day 4, but otherwise is doing most ususal activities. GERD symptoms have resolved entirely with daily protonix. She has no peripheral neuropathy symptoms. Appetite is adequate. Bowels are moving. She feels that her abdomen is no longer distended. She gets restless legs with premed benadryl with some treatments (?rate related?). Peripheral IV access has required several sticks each time, which she does not mind (on anticoagulation so central line placement more complicated). She has had no bleeding. She denies shortness of breath or chest pain. Remainder of 10 point Review of Systems negative.  Objective:  Vital signs in last 24 hours:  BP 121/70  Pulse 70  Temp(Src) 97.8 F (36.6 C) (Oral)  Resp 18  Ht 5\' 7"  (1.702 m)  Wt 139 lb (63.05 kg)  BMI 21.77 kg/m2  Weight is down ~ 3 lbs. Easily mobile, looks comfortable. Hair thinner but no complete alopecia.  HEENT:PERRLA, sclera clear, anicteric and oropharynx clear, no lesions LymphaticsCervical, supraclavicular, and axillary nodes normal. Resp: clear to auscultation bilaterally and normal percussion bilaterally Cardio: regular rate and rhythm GI: soft, not tender, again less distended. Normal bowel sounds, no HSM or mass Extremities: extremities normal, atraumatic, no  cyanosis or edema Neuro:no sensory deficits noted Breast:normal without suspicious masses, skin or nipple changes or axillary nodes Sites of peripheral IVs without erythema or tenderness. Skin without rash or ecchymoses  Lab Results:  Results for orders placed in visit on 07/31/12  CBC WITH DIFFERENTIAL      Result Value Range   WBC 2.9 (*) 3.9 - 10.3 10e3/uL   NEUT# 1.7  1.5 - 6.5 10e3/uL   HGB 11.3 (*) 11.6 - 15.9 g/dL   HCT 16.1 (*) 09.6 - 04.5 %   Platelets 195  145 - 400 10e3/uL   MCV 84.1  79.5 - 101.0 fL   MCH 27.6  25.1 - 34.0 pg   MCHC 32.8  31.5 - 36.0 g/dL   RBC 4.09  8.11 - 9.14 10e6/uL   RDW 18.9 (*) 11.2 - 14.5 %   lymph# 1.0  0.9 - 3.3 10e3/uL   MONO# 0.1  0.1 - 0.9 10e3/uL   Eosinophils Absolute 0.0  0.0 - 0.5 10e3/uL   Basophils Absolute 0.0  0.0 - 0.1 10e3/uL   NEUT% 58.6  38.4 - 76.8 %   LYMPH% 36.1  14.0 - 49.7 %   MONO% 4.2  0.0 - 14.0 %   EOS% 0.4  0.0 - 7.0 %   BASO% 0.7  0.0 - 2.0 %  COMPREHENSIVE METABOLIC PANEL (CC13)      Result Value Range   Sodium 138  136 - 145 mEq/L   Potassium 4.2  3.5 - 5.1 mEq/L   Chloride 104  98 - 107 mEq/L   CO2 26  22 - 29 mEq/L   Glucose 88  70 - 99 mg/dl   BUN 78.2  7.0 - 95.6 mg/dL   Creatinine 0.7  0.6 - 1.1 mg/dL   Total Bilirubin 2.13  0.20 - 1.20 mg/dL   Alkaline Phosphatase 113  40 - 150 U/L   AST 24  5 - 34 U/L   ALT 19  0 - 55 U/L   Total Protein 7.4  6.4 - 8.3 g/dL   Albumin 3.2 (*) 3.5 - 5.0 g/dL   Calcium 9.7  8.4 - 08.6 mg/dL    CA 578 available after visit 188, this having been 1041 at start of neoadjuvant chemo.  Studies/Results:  CT CAP is scheduled for 08-19-12  Medications: I have reviewed the patient's current medications. No changes.  We have talked at length today about possibility of surgery after these first several cycles of neoadjuvant chemo, vs additional cycles neoadjuvant then surgery, which she understands gyn oncologist will consider depending on results of upcoming scans after  cycle 3. She is aware that she will probably need additional chemo after surgery also. We have discussed fact that we do not know more specifics of type of gyn cancer, as the ascites showed adenocarcinoma not otherwise specified. We have discussed fact that BRCA mutations seem more common with certain of the pathologic types of gyn cancer. We have discussed mechanism of ascites production. She has had all questions answered to her satisfaction.  Assessment/Plan:  1. 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  Will repeat CT CAP and see Dr Duard Brady after cycle 3.  2.Change in bowel habits in past year, unable to tolerate prep for colonoscopy. Bowels moving with miralax. CEA not elevated. GERD resolved with Protonix.  3.long tobacco recently discontinued. Will get full CT chest with reimaging scans   4.extensive DVT involving IVC, pelvic veins and left common femoral, as well as superficial phlebitis LLE: continuing xarelto. Will need IVC filter prior to surgery. Platelets ok so far.  5.overdue mammograms: no obvious significant concerns on PE  6.ruptured appendix remotely  7.basal cell carcinomas  8. Pneumovax given  9.Advance Directives and HCPOA done, but full support to that point.    Reece Packer, MD   07/31/2012, 12:48 PM

## 2012-07-31 NOTE — Telephone Encounter (Signed)
Per staff phone call and POF I have schedueld appts.  JMW  

## 2012-08-01 ENCOUNTER — Other Ambulatory Visit: Payer: Self-pay | Admitting: Oncology

## 2012-08-01 ENCOUNTER — Telehealth: Payer: Self-pay | Admitting: *Deleted

## 2012-08-01 NOTE — Telephone Encounter (Signed)
Message copied by Carola Rhine A on Thu Aug 01, 2012  8:52 AM ------      Message from: Jama Flavors P      Created: Thu Aug 01, 2012  8:06 AM       Labs seen and need follow up: please let her know ca125 down to 188 ------

## 2012-08-01 NOTE — Telephone Encounter (Signed)
Pt notified of ca 125. Pt was please with results and was thankful for the call

## 2012-08-02 ENCOUNTER — Ambulatory Visit (HOSPITAL_BASED_OUTPATIENT_CLINIC_OR_DEPARTMENT_OTHER): Payer: Medicare Other

## 2012-08-02 ENCOUNTER — Other Ambulatory Visit: Payer: Self-pay | Admitting: Oncology

## 2012-08-02 ENCOUNTER — Other Ambulatory Visit (HOSPITAL_BASED_OUTPATIENT_CLINIC_OR_DEPARTMENT_OTHER): Payer: Medicare Other | Admitting: Lab

## 2012-08-02 VITALS — BP 125/69 | HR 67 | Temp 97.8°F | Resp 18

## 2012-08-02 DIAGNOSIS — Z5111 Encounter for antineoplastic chemotherapy: Secondary | ICD-10-CM

## 2012-08-02 DIAGNOSIS — C801 Malignant (primary) neoplasm, unspecified: Secondary | ICD-10-CM

## 2012-08-02 DIAGNOSIS — C786 Secondary malignant neoplasm of retroperitoneum and peritoneum: Secondary | ICD-10-CM

## 2012-08-02 DIAGNOSIS — C569 Malignant neoplasm of unspecified ovary: Secondary | ICD-10-CM

## 2012-08-02 LAB — CBC WITH DIFFERENTIAL/PLATELET
Basophils Absolute: 0 10*3/uL (ref 0.0–0.1)
EOS%: 0 % (ref 0.0–7.0)
Eosinophils Absolute: 0 10*3/uL (ref 0.0–0.5)
HCT: 33.7 % — ABNORMAL LOW (ref 34.8–46.6)
HGB: 11 g/dL — ABNORMAL LOW (ref 11.6–15.9)
MCH: 27.3 pg (ref 25.1–34.0)
MCV: 83.6 fL (ref 79.5–101.0)
MONO%: 4.1 % (ref 0.0–14.0)
NEUT#: 1.4 10*3/uL — ABNORMAL LOW (ref 1.5–6.5)
NEUT%: 62.1 % (ref 38.4–76.8)

## 2012-08-02 MED ORDER — ONDANSETRON 16 MG/50ML IVPB (CHCC)
16.0000 mg | Freq: Once | INTRAVENOUS | Status: AC
  Administered 2012-08-02: 16 mg via INTRAVENOUS

## 2012-08-02 MED ORDER — DIPHENHYDRAMINE HCL 50 MG/ML IJ SOLN
50.0000 mg | Freq: Once | INTRAMUSCULAR | Status: AC
  Administered 2012-08-02: 50 mg via INTRAVENOUS

## 2012-08-02 MED ORDER — SODIUM CHLORIDE 0.9 % IV SOLN
546.0000 mg | Freq: Once | INTRAVENOUS | Status: AC
  Administered 2012-08-02: 550 mg via INTRAVENOUS
  Filled 2012-08-02: qty 55

## 2012-08-02 MED ORDER — PACLITAXEL CHEMO INJECTION 300 MG/50ML
80.0000 mg/m2 | Freq: Once | INTRAVENOUS | Status: AC
  Administered 2012-08-02: 144 mg via INTRAVENOUS
  Filled 2012-08-02: qty 24

## 2012-08-02 MED ORDER — DEXAMETHASONE SODIUM PHOSPHATE 4 MG/ML IJ SOLN
20.0000 mg | Freq: Once | INTRAMUSCULAR | Status: AC
  Administered 2012-08-02: 20 mg via INTRAVENOUS

## 2012-08-02 MED ORDER — SODIUM CHLORIDE 0.9 % IV SOLN
Freq: Once | INTRAVENOUS | Status: AC
  Administered 2012-08-02: 10:00:00 via INTRAVENOUS

## 2012-08-02 MED ORDER — FAMOTIDINE IN NACL 20-0.9 MG/50ML-% IV SOLN
20.0000 mg | Freq: Once | INTRAVENOUS | Status: AC
  Administered 2012-08-02: 20 mg via INTRAVENOUS

## 2012-08-02 NOTE — Progress Notes (Signed)
Labs reviewed with MD,Ok to treat, pt to will receive Neupogen 4/12

## 2012-08-02 NOTE — Patient Instructions (Addendum)
 [facility] Discharge Instructions for Patients Receiving Chemotherapy  Today you received the following chemotherapy agents Carboplatin/taxol  To help prevent nausea and vomiting after your treatment, we encourage you to take your nausea medication as directed   If you develop nausea and vomiting that is not controlled by your nausea medication, call the clinic. If it is after clinic hours your family physician or the after hours number for the clinic or go to the Emergency Department.   BELOW ARE SYMPTOMS THAT SHOULD BE REPORTED IMMEDIATELY:  *FEVER GREATER THAN 100.5 F  *CHILLS WITH OR WITHOUT FEVER  NAUSEA AND VOMITING THAT IS NOT CONTROLLED WITH YOUR NAUSEA MEDICATION  *UNUSUAL SHORTNESS OF BREATH  *UNUSUAL BRUISING OR BLEEDING  TENDERNESS IN MOUTH AND THROAT WITH OR WITHOUT PRESENCE OF ULCERS  *URINARY PROBLEMS  *BOWEL PROBLEMS  UNUSUAL RASH Items with * indicate a potential emergency and should be followed up as soon as possible.  One of the nurses will contact you 24 hours after your treatment. Please let the nurse know about any problems that you may have experienced. Feel free to call the clinic you have any questions or concerns. The clinic phone number is [phone].   I have been informed and understand all the instructions given to me. I know to contact the clinic, my physician, or go to the Emergency Department if any problems should occur. I do not have any questions at this time, but understand that I may call the clinic during office hours   should I have any questions or need assistance in obtaining follow up care.    __________________________________________  _____________  __________ Signature of Patient or Authorized Representative            Date                   Time    __________________________________________ Nurse's Signature

## 2012-08-03 ENCOUNTER — Ambulatory Visit (HOSPITAL_BASED_OUTPATIENT_CLINIC_OR_DEPARTMENT_OTHER): Payer: Medicare Other

## 2012-08-03 VITALS — BP 110/70 | HR 80 | Temp 97.4°F | Resp 16

## 2012-08-03 DIAGNOSIS — C569 Malignant neoplasm of unspecified ovary: Secondary | ICD-10-CM

## 2012-08-03 MED ORDER — FILGRASTIM 300 MCG/0.5ML IJ SOLN
300.0000 ug | Freq: Once | INTRAMUSCULAR | Status: AC
  Administered 2012-08-03: 300 ug via SUBCUTANEOUS

## 2012-08-05 ENCOUNTER — Other Ambulatory Visit: Payer: Medicare Other | Admitting: Lab

## 2012-08-05 ENCOUNTER — Other Ambulatory Visit (HOSPITAL_BASED_OUTPATIENT_CLINIC_OR_DEPARTMENT_OTHER): Payer: Medicare Other | Admitting: Lab

## 2012-08-05 ENCOUNTER — Ambulatory Visit: Payer: Medicare Other

## 2012-08-05 DIAGNOSIS — C801 Malignant (primary) neoplasm, unspecified: Secondary | ICD-10-CM

## 2012-08-05 DIAGNOSIS — C569 Malignant neoplasm of unspecified ovary: Secondary | ICD-10-CM

## 2012-08-05 DIAGNOSIS — C786 Secondary malignant neoplasm of retroperitoneum and peritoneum: Secondary | ICD-10-CM

## 2012-08-05 LAB — CBC WITH DIFFERENTIAL/PLATELET
Basophils Absolute: 0 10*3/uL (ref 0.0–0.1)
Eosinophils Absolute: 0 10*3/uL (ref 0.0–0.5)
HGB: 11.4 g/dL — ABNORMAL LOW (ref 11.6–15.9)
MCV: 83.1 fL (ref 79.5–101.0)
MONO#: 0.2 10*3/uL (ref 0.1–0.9)
NEUT#: 4.5 10*3/uL (ref 1.5–6.5)
RBC: 4.13 10*6/uL (ref 3.70–5.45)
RDW: 19.5 % — ABNORMAL HIGH (ref 11.2–14.5)
WBC: 5.8 10*3/uL (ref 3.9–10.3)
lymph#: 1 10*3/uL (ref 0.9–3.3)
nRBC: 0 % (ref 0–0)

## 2012-08-05 NOTE — Progress Notes (Signed)
Jaiyla here for possible Neupogen injection.  Per Dr Darrold Span hold injection of ANC >1.5.   ANC today 4.5.   Injection held.

## 2012-08-09 ENCOUNTER — Other Ambulatory Visit: Payer: Self-pay | Admitting: Oncology

## 2012-08-09 ENCOUNTER — Other Ambulatory Visit (HOSPITAL_BASED_OUTPATIENT_CLINIC_OR_DEPARTMENT_OTHER): Payer: Medicare Other | Admitting: Lab

## 2012-08-09 ENCOUNTER — Ambulatory Visit (HOSPITAL_BASED_OUTPATIENT_CLINIC_OR_DEPARTMENT_OTHER): Payer: Medicare Other

## 2012-08-09 VITALS — BP 119/50 | HR 62 | Temp 97.0°F

## 2012-08-09 DIAGNOSIS — C569 Malignant neoplasm of unspecified ovary: Secondary | ICD-10-CM

## 2012-08-09 DIAGNOSIS — C801 Malignant (primary) neoplasm, unspecified: Secondary | ICD-10-CM

## 2012-08-09 DIAGNOSIS — Z5111 Encounter for antineoplastic chemotherapy: Secondary | ICD-10-CM

## 2012-08-09 DIAGNOSIS — C786 Secondary malignant neoplasm of retroperitoneum and peritoneum: Secondary | ICD-10-CM

## 2012-08-09 LAB — CBC WITH DIFFERENTIAL/PLATELET
BASO%: 0.9 % (ref 0.0–2.0)
Eosinophils Absolute: 0 10*3/uL (ref 0.0–0.5)
HCT: 33.3 % — ABNORMAL LOW (ref 34.8–46.6)
LYMPH%: 31.2 % (ref 14.0–49.7)
MONO#: 0.1 10*3/uL (ref 0.1–0.9)
NEUT#: 1.4 10*3/uL — ABNORMAL LOW (ref 1.5–6.5)
NEUT%: 61.9 % (ref 38.4–76.8)
Platelets: 213 10*3/uL (ref 145–400)
WBC: 2.2 10*3/uL — ABNORMAL LOW (ref 3.9–10.3)
lymph#: 0.7 10*3/uL — ABNORMAL LOW (ref 0.9–3.3)
nRBC: 0 % (ref 0–0)

## 2012-08-09 MED ORDER — SODIUM CHLORIDE 0.9 % IV SOLN
80.0000 mg/m2 | Freq: Once | INTRAVENOUS | Status: AC
  Administered 2012-08-09: 144 mg via INTRAVENOUS
  Filled 2012-08-09: qty 24

## 2012-08-09 MED ORDER — SODIUM CHLORIDE 0.9 % IV SOLN
Freq: Once | INTRAVENOUS | Status: AC
  Administered 2012-08-09: 11:00:00 via INTRAVENOUS

## 2012-08-09 MED ORDER — FAMOTIDINE IN NACL 20-0.9 MG/50ML-% IV SOLN
20.0000 mg | Freq: Once | INTRAVENOUS | Status: AC
  Administered 2012-08-09: 20 mg via INTRAVENOUS

## 2012-08-09 MED ORDER — DIPHENHYDRAMINE HCL 50 MG/ML IJ SOLN
50.0000 mg | Freq: Once | INTRAMUSCULAR | Status: AC
  Administered 2012-08-09: 50 mg via INTRAVENOUS

## 2012-08-09 MED ORDER — DEXAMETHASONE SODIUM PHOSPHATE 4 MG/ML IJ SOLN
20.0000 mg | Freq: Once | INTRAMUSCULAR | Status: AC
  Administered 2012-08-09: 20 mg via INTRAVENOUS

## 2012-08-09 MED ORDER — ONDANSETRON 8 MG/50ML IVPB (CHCC)
8.0000 mg | Freq: Once | INTRAVENOUS | Status: AC
  Administered 2012-08-09: 8 mg via INTRAVENOUS

## 2012-08-09 NOTE — Patient Instructions (Addendum)
[facility] Discharge Instructions for Patients Receiving Chemotherapy  Today you received the following chemotherapy agent: Taxol   To help prevent nausea and vomiting after your treatment, we encourage you to take your nausea medications:  Zofran 8 mg twice daily as needed  Ativan 0.5 mg tablet- take 1/2 to 1 tablet every 6 hours prn nausea Begin taking it at 1st sign of nausea and take it as often as prescribed for the next.   If you develop nausea and vomiting that is not controlled by your nausea medication, call the clinic. If it is after clinic hours your family physician or the after hours number for the clinic or go to the Emergency Department.  Follow up on 08/10/12 for Neupogen as scheduled.   BELOW ARE SYMPTOMS THAT SHOULD BE REPORTED IMMEDIATELY:  *FEVER GREATER THAN 100.5 F  *CHILLS WITH OR WITHOUT FEVER  NAUSEA AND VOMITING THAT IS NOT CONTROLLED WITH YOUR NAUSEA MEDICATION  *UNUSUAL SHORTNESS OF BREATH  *UNUSUAL BRUISING OR BLEEDING  TENDERNESS IN MOUTH AND THROAT WITH OR WITHOUT PRESENCE OF ULCERS  *URINARY PROBLEMS  *BOWEL PROBLEMS  UNUSUAL RASH Items with * indicate a potential emergency and should be followed up as soon as possible.  . Feel free to call the clinic you have any questions or concerns. The clinic phone number is [phone].   I have been informed and understand all the instructions given to me. I know to contact the clinic, my physician, or go to the Emergency Department if any problems should occur. I do not have any questions at this time, but understand that I may call the clinic during office hours   should I have any questions or need assistance in obtaining follow up care.    __________________________________________  _____________  __________ Signature of Patient or Authorized Representative            Date                   Time    __________________________________________ Nurse's Signature

## 2012-08-10 ENCOUNTER — Ambulatory Visit (HOSPITAL_BASED_OUTPATIENT_CLINIC_OR_DEPARTMENT_OTHER): Payer: Medicare Other

## 2012-08-10 VITALS — BP 98/49 | HR 60 | Temp 97.6°F

## 2012-08-10 DIAGNOSIS — Z5189 Encounter for other specified aftercare: Secondary | ICD-10-CM

## 2012-08-10 DIAGNOSIS — C569 Malignant neoplasm of unspecified ovary: Secondary | ICD-10-CM

## 2012-08-10 DIAGNOSIS — C801 Malignant (primary) neoplasm, unspecified: Secondary | ICD-10-CM

## 2012-08-10 MED ORDER — FILGRASTIM 300 MCG/0.5ML IJ SOLN
300.0000 ug | Freq: Once | INTRAMUSCULAR | Status: AC
  Administered 2012-08-10: 300 ug via SUBCUTANEOUS

## 2012-08-11 ENCOUNTER — Other Ambulatory Visit: Payer: Self-pay | Admitting: Oncology

## 2012-08-16 ENCOUNTER — Ambulatory Visit (HOSPITAL_BASED_OUTPATIENT_CLINIC_OR_DEPARTMENT_OTHER): Payer: Medicare Other

## 2012-08-16 ENCOUNTER — Other Ambulatory Visit (HOSPITAL_BASED_OUTPATIENT_CLINIC_OR_DEPARTMENT_OTHER): Payer: Medicare Other | Admitting: Lab

## 2012-08-16 VITALS — BP 109/71 | HR 70 | Temp 98.7°F | Resp 18

## 2012-08-16 DIAGNOSIS — C569 Malignant neoplasm of unspecified ovary: Secondary | ICD-10-CM

## 2012-08-16 DIAGNOSIS — C786 Secondary malignant neoplasm of retroperitoneum and peritoneum: Secondary | ICD-10-CM

## 2012-08-16 DIAGNOSIS — C801 Malignant (primary) neoplasm, unspecified: Secondary | ICD-10-CM

## 2012-08-16 DIAGNOSIS — Z5111 Encounter for antineoplastic chemotherapy: Secondary | ICD-10-CM

## 2012-08-16 LAB — CBC WITH DIFFERENTIAL/PLATELET
BASO%: 0.6 % (ref 0.0–2.0)
EOS%: 0 % (ref 0.0–7.0)
HCT: 32.1 % — ABNORMAL LOW (ref 34.8–46.6)
MCH: 28 pg (ref 25.1–34.0)
MCHC: 33 g/dL (ref 31.5–36.0)
MONO#: 0.1 10*3/uL (ref 0.1–0.9)
NEUT%: 75.9 % (ref 38.4–76.8)
RBC: 3.78 10*6/uL (ref 3.70–5.45)
RDW: 20.4 % — ABNORMAL HIGH (ref 11.2–14.5)
WBC: 3.3 10*3/uL — ABNORMAL LOW (ref 3.9–10.3)
lymph#: 0.7 10*3/uL — ABNORMAL LOW (ref 0.9–3.3)
nRBC: 0 % (ref 0–0)

## 2012-08-16 MED ORDER — SODIUM CHLORIDE 0.9 % IV SOLN
80.0000 mg/m2 | Freq: Once | INTRAVENOUS | Status: AC
  Administered 2012-08-16: 144 mg via INTRAVENOUS
  Filled 2012-08-16: qty 24

## 2012-08-16 MED ORDER — DEXAMETHASONE SODIUM PHOSPHATE 4 MG/ML IJ SOLN
20.0000 mg | Freq: Once | INTRAMUSCULAR | Status: AC
  Administered 2012-08-16: 20 mg via INTRAVENOUS

## 2012-08-16 MED ORDER — FAMOTIDINE IN NACL 20-0.9 MG/50ML-% IV SOLN
20.0000 mg | Freq: Once | INTRAVENOUS | Status: AC
  Administered 2012-08-16: 20 mg via INTRAVENOUS

## 2012-08-16 MED ORDER — PACLITAXEL CHEMO INJECTION 300 MG/50ML: 80.0000 mg/m2 | Freq: Once | INTRAVENOUS | Status: DC

## 2012-08-16 MED ORDER — ONDANSETRON 8 MG/50ML IVPB (CHCC)
8.0000 mg | Freq: Once | INTRAVENOUS | Status: AC
  Administered 2012-08-16: 8 mg via INTRAVENOUS

## 2012-08-16 MED ORDER — SODIUM CHLORIDE 0.9 % IV SOLN
Freq: Once | INTRAVENOUS | Status: AC
  Administered 2012-08-16: 11:00:00 via INTRAVENOUS

## 2012-08-16 MED ORDER — DIPHENHYDRAMINE HCL 50 MG/ML IJ SOLN
50.0000 mg | Freq: Once | INTRAMUSCULAR | Status: AC
  Administered 2012-08-16: 50 mg via INTRAVENOUS

## 2012-08-16 NOTE — Patient Instructions (Addendum)
[facility] Discharge Instructions for Patients Receiving Chemotherapy  Today you received the following chemotherapy agents Taxol.  To help prevent nausea and vomiting after your treatment, we encourage you to take your nausea medication as ordered per MD.    If you develop nausea and vomiting that is not controlled by your nausea medication, call the clinic. If it is after clinic hours your family physician or the after hours number for the clinic or go to the Emergency Department.   BELOW ARE SYMPTOMS THAT SHOULD BE REPORTED IMMEDIATELY:  *FEVER GREATER THAN 100.5 F  *CHILLS WITH OR WITHOUT FEVER  NAUSEA AND VOMITING THAT IS NOT CONTROLLED WITH YOUR NAUSEA MEDICATION  *UNUSUAL SHORTNESS OF BREATH  *UNUSUAL BRUISING OR BLEEDING  TENDERNESS IN MOUTH AND THROAT WITH OR WITHOUT PRESENCE OF ULCERS  *URINARY PROBLEMS  *BOWEL PROBLEMS  UNUSUAL RASH Items with * indicate a potential emergency and should be followed up as soon as possible.   Please let the nurse know about any problems that you may have experienced. Feel free to call the clinic you have any questions or concerns. The clinic phone number is [phone].   I have been informed and understand all the instructions given to me. I know to contact the clinic, my physician, or go to the Emergency Department if any problems should occur. I do not have any questions at this time, but understand that I may call the clinic during office hours   should I have any questions or need assistance in obtaining follow up care.    __________________________________________  _____________  __________ Signature of Patient or Authorized Representative            Date                   Time    __________________________________________ Nurse's Signature    

## 2012-08-16 NOTE — Progress Notes (Signed)
OK to treat today with WBC-3.3 per Dr. Darrold Span.  Pt to come to [facility] tomorrow for Neupogen injection per MD.  Pt notified and has no questions at this time.

## 2012-08-17 ENCOUNTER — Ambulatory Visit (HOSPITAL_BASED_OUTPATIENT_CLINIC_OR_DEPARTMENT_OTHER): Payer: Medicare Other

## 2012-08-17 VITALS — BP 111/42 | HR 52 | Temp 97.5°F

## 2012-08-17 DIAGNOSIS — C801 Malignant (primary) neoplasm, unspecified: Secondary | ICD-10-CM

## 2012-08-17 DIAGNOSIS — C786 Secondary malignant neoplasm of retroperitoneum and peritoneum: Secondary | ICD-10-CM

## 2012-08-17 DIAGNOSIS — Z5189 Encounter for other specified aftercare: Secondary | ICD-10-CM

## 2012-08-17 DIAGNOSIS — C569 Malignant neoplasm of unspecified ovary: Secondary | ICD-10-CM

## 2012-08-17 MED ORDER — FILGRASTIM 300 MCG/0.5ML IJ SOLN
300.0000 ug | Freq: Once | INTRAMUSCULAR | Status: AC
  Administered 2012-08-17: 300 ug via SUBCUTANEOUS

## 2012-08-19 ENCOUNTER — Telehealth: Payer: Self-pay | Admitting: Dietician

## 2012-08-19 ENCOUNTER — Encounter (HOSPITAL_COMMUNITY): Payer: Self-pay

## 2012-08-19 ENCOUNTER — Ambulatory Visit (HOSPITAL_COMMUNITY)
Admission: RE | Admit: 2012-08-19 | Discharge: 2012-08-19 | Disposition: A | Discharge status: Home / Self Care | Payer: Medicare Other | Source: Ambulatory Visit | Attending: Oncology | Admitting: Oncology

## 2012-08-19 DIAGNOSIS — K7689 Other specified diseases of liver: Secondary | ICD-10-CM | POA: Insufficient documentation

## 2012-08-19 DIAGNOSIS — Z9221 Personal history of antineoplastic chemotherapy: Secondary | ICD-10-CM | POA: Insufficient documentation

## 2012-08-19 DIAGNOSIS — R188 Other ascites: Secondary | ICD-10-CM | POA: Insufficient documentation

## 2012-08-19 DIAGNOSIS — R928 Other abnormal and inconclusive findings on diagnostic imaging of breast: Secondary | ICD-10-CM | POA: Insufficient documentation

## 2012-08-19 DIAGNOSIS — C569 Malignant neoplasm of unspecified ovary: Secondary | ICD-10-CM | POA: Insufficient documentation

## 2012-08-19 DIAGNOSIS — E041 Nontoxic single thyroid nodule: Secondary | ICD-10-CM | POA: Insufficient documentation

## 2012-08-19 HISTORY — DX: Malignant neoplasm of unspecified ovary: C56.9

## 2012-08-19 MED ORDER — IOHEXOL 300 MG/ML  SOLN
50.0000 mL | Freq: Once | INTRAMUSCULAR | Status: AC | PRN
  Administered 2012-08-19: 50 mL via ORAL

## 2012-08-19 MED ORDER — IOHEXOL 300 MG/ML  SOLN
100.0000 mL | Freq: Once | INTRAMUSCULAR | Status: AC | PRN
  Administered 2012-08-19: 100 mL via INTRAVENOUS

## 2012-08-19 NOTE — Telephone Encounter (Signed)
Brief Outpatient Oncology Nutrition Note  Patient has been identified to be at risk on malnutrition screen.   Wt Readings from Last 10 Encounters:  07/31/12 139 lb (63.05 kg)  07/10/12 142 lb 12.8 oz (64.774 kg)  07/05/12 147 lb (66.679 kg)  06/26/12 144 lb 14.4 oz (65.726 kg)  06/19/12 146 lb 14.4 oz (66.633 kg)  06/12/12 151 lb 12.8 oz (68.856 kg)    Called and spoke with patient who reports good intake.  States weight loss related to decreased ascites.  No questions at this time.  RD available as needed.  Oran Rein, RD, LDN

## 2012-08-20 ENCOUNTER — Other Ambulatory Visit: Payer: Self-pay | Admitting: *Deleted

## 2012-08-20 DIAGNOSIS — C569 Malignant neoplasm of unspecified ovary: Secondary | ICD-10-CM

## 2012-08-20 DIAGNOSIS — K30 Functional dyspepsia: Secondary | ICD-10-CM

## 2012-08-20 MED ORDER — PANTOPRAZOLE SODIUM 40 MG PO TBEC: 40.0000 mg | DELAYED_RELEASE_TABLET | Freq: Every day | ORAL | Status: DC

## 2012-08-20 NOTE — Telephone Encounter (Signed)
Patient called requesting protonix refill, last filled on 06-24-2012.  Instructed to call Pharmacy despite bottle reading zero refills.  Will send today's refill per patient's request.

## 2012-08-21 ENCOUNTER — Encounter: Payer: Self-pay | Admitting: Gynecologic Oncology

## 2012-08-21 ENCOUNTER — Other Ambulatory Visit: Payer: Self-pay | Admitting: Oncology

## 2012-08-21 ENCOUNTER — Ambulatory Visit: Payer: Medicare Other | Attending: Gynecologic Oncology | Admitting: Gynecologic Oncology

## 2012-08-21 ENCOUNTER — Other Ambulatory Visit (HOSPITAL_BASED_OUTPATIENT_CLINIC_OR_DEPARTMENT_OTHER): Payer: Medicare Other | Admitting: Lab

## 2012-08-21 VITALS — BP 132/60 | HR 88 | Temp 99.4°F | Resp 22 | Ht 67.0 in | Wt 139.1 lb

## 2012-08-21 DIAGNOSIS — C801 Malignant (primary) neoplasm, unspecified: Secondary | ICD-10-CM

## 2012-08-21 DIAGNOSIS — R188 Other ascites: Secondary | ICD-10-CM | POA: Insufficient documentation

## 2012-08-21 DIAGNOSIS — N9489 Other specified conditions associated with female genital organs and menstrual cycle: Secondary | ICD-10-CM | POA: Insufficient documentation

## 2012-08-21 DIAGNOSIS — C569 Malignant neoplasm of unspecified ovary: Secondary | ICD-10-CM | POA: Insufficient documentation

## 2012-08-21 DIAGNOSIS — C786 Secondary malignant neoplasm of retroperitoneum and peritoneum: Secondary | ICD-10-CM

## 2012-08-21 LAB — COMPREHENSIVE METABOLIC PANEL (CC13)
ALT: 24 U/L (ref 0–55)
BUN: 17.1 mg/dL (ref 7.0–26.0)
CO2: 27 mEq/L (ref 22–29)
Calcium: 9.5 mg/dL (ref 8.4–10.4)
Creatinine: 0.7 mg/dL (ref 0.6–1.1)
Total Bilirubin: 0.44 mg/dL (ref 0.20–1.20)

## 2012-08-21 NOTE — Progress Notes (Signed)
Consult Note: Gyn-Onc  Monique [name] 66 y.o. female  CC:  Chief Complaint  Patient presents with  . Ovarian Cancer    Follow up    HPI:  Patient is a 66-year-old gravida 0 who in May 2013 had some sharp right-sided pain. 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 She also had an onset constipation and blame this on a poor diet. She began taking Citrucel which should help her symptoms somewhat. But 3 months later she finally sought gastroenterologist for scheduling her for colonoscopy. She had n/v with the prep and she was unable to tolerate the colonoscopy and that procedure which was  delayed for 7 weeks after the initial consultation was then pushed back for an additional 3 weeks. She again took a different prep and has a nausea vomiting and a colonoscopy had to be rescheduled again with the thought being that this would not be scheduled in January. She ultimately never had a colonoscopy done. She did think that she had some component of an irritable bowel syndrome she started taking probiotic her symptoms got better.   In February she was diagnosed with probable phlebitis had an initial peripheral venous Doppler that was negative. She began having increased pain in her left thigh had repeat Doppler showed a DVT on her left side. This subsequently prompted a CT scan which she had on February 15. It revealed a small right-sided pleural effusion. There is a focal pulmonary embolism in the right lower lobe of the pulmonary artery. There is extensive ascites at the abdomen and pelvis. There was a mass in the anterior segment of the right lobe of the liver near the dome measuring 1.6 x 1.4 cm. There were no other focal liver lesions identified. The spleen, pancreas, and adrenals appeared normal. There were no renal masses or hydronephrosis. There is a mass abutting the omentum lateral to the dome of the liver on the  right measuring 3.4 x 2.3 cm. There was diffuse thickening of the omentum in the right upper quadrant anteriorly and laterally. Within the pelvis there is a complex mass was cystic and solid components measuring 12.8 x 6.6 cm. It crosses the midline and extends to both the right and left sides. There is a deep venous thrombosis in the pelvic vessels. The inferior vena cava is diminutive and felt to be involved with thrombus. There multiple small retroperitoneal lymph nodes. There was an enlarged left inguinal lymph node measuring 1.8 x 1.6 cm and a right inguinal lymph node measuring 2.2 x 1.5 cm. CA 125 was drawn and was elevated at 569.9. It is for this reason that she comes in today. She was initially treated with Lovenox for 5 days and is currently on Xarelto.  She had a high-volume paracentesis by interventional radiology on February 19 for 2.8 L. Cytology was consistent with adenocarcinoma. She began neoadjuvant dose dense carboplatin and paclitaxel on February 28.  She is status post 3 cycles of dose dense paclitaxel and carboplatin. Carboplatin was given at an AUC of 4 for cycle 1 and then increase to an AUC of 5 for cycle 2. She completed day 15 of cycle #3 on April 25. She had a CT scan at that time that revealed:  CT CHEST, ABDOMEN AND PELVIS WITH CONTRAST   CT CHEST  Findings: There is no axillary lymphadenopathy. 15 mm thyroid nodule identified in the left gland.   Other scattered smaller thyroid nodules are seen bilaterally. Scattered small lymph nodes are seen in the mediastinum. No hilar lymphadenopathy. Heart size is normal. No pericardial or pleural effusion. 11 mm soft tissue nodule is seen in the upper outer quadrant of the right breast.  Lung windows revealed no focal airspace consolidation. No parenchymal nodule or mass.  Bone windows reveal no worrisome lytic or sclerotic osseous lesions.  IMPRESSION:  No evidence of pleural effusion.  11 mm nodule in the upper outer quadrant of  the right breast.  15 mm left thyroid nodule. Thyroid ultrasound may prove helpful to  further evaluate.  CT ABDOMEN AND PELVIS  Findings: The volume of intraperitoneal disease has decreased substantially in the interval. The peritoneal thickening and nodularity seen previously has nearly resolved. The omental disease has decreased substantially. There is only a thin rim of disease adjacent to the liver and spleen on the current study. The 17 mm subcapsular anterior liver lesion seen on the previous study now measures only 8 mm.  The stomach, duodenum, pancreas, gallbladder, and adrenal glands are unremarkable. Kidneys have normal imaging features.  No abdominal aortic aneurysm. No retroperitoneal lymphadenopathy.   Imaging through the pelvis shows no free fluid. The large complex left adnexal cystic mass has decreased, measuring 6.8 x 5.2 cm  today compared to 9.2 x 6.9 cm previously. The cystic lesion in the left adnexal space has also decreased, measuring approximately  3.7 x 2.6 cm today compared to 6.5 x 5.6 cm previously. There is some mild wall thickening and apparent tethering in the region of the sigmoid colon which may be post-treatment in etiology. Bone windows reveal no worrisome lytic or sclerotic osseous lesions.   IMPRESSION:  Substantial interval improvement in response to therapy. The marked peritoneal thickening and nodularity has nearly resolved and the omental disease seen previously has improved substantially. 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  Bilateral cystic adnexal masses are improved.  Her CA 125 has also decreased from 1041 to 188.   Review of Systems: She is feeling much better. She has a subjective improvement in her ascites and overall feels much stronger than she did before her surgery. She occasionally has a stitch in her right side but has been long-standing. She is some slightly loose stools after her CT scan. She has a change  about bladder habits. She has a chest pain, shortness of breath, nausea, vomiting, fevers, chills. Has a slight yellow discharge from her nostril today in a low-grade fever 99.4. She has a chest pain or shortness of breath. 10 point review of systems is otherwise negative. She is a retired operating room nurse. She Pap smear in 2011 that was negative. She never had a colonoscopy. She quit smoking December 2013 when she had an episode of bronchitis.  Current Meds:  Outpatient Encounter Prescriptions as of 08/21/2012  Medication Sig Dispense Refill  . dexamethasone (DECADRON) 4 MG tablet TAKE 5 TABLETS BY MOUTH WITH FOOD EVERY 12 HOURS THEN 6 HOURS BEFORE CHEMO TREATMENT  30 tablet  2  . Multiple Vitamins-Minerals (MULTIVITAMIN PO) Take 1 tablet by mouth daily.      . pantoprazole (PROTONIX) 40 MG tablet Take 1 tablet (40 mg total) by mouth daily.  30 tablet  1  . Rivaroxaban (XARELTO) 20 MG TABS Take 20 mg by mouth daily.      . aspirin EC 81 MG tablet Take 81 mg by mouth daily.      . LORazepam (ATIVAN) 0.5 MG   tablet Take 1/2 to 1 tablet every 6 hours SL or PO as needed for nausea.  20 tablet  0  . ondansetron (ZOFRAN) 8 MG tablet Take 1 tablet (8 mg total) by mouth every 12 (twelve) hours as needed for nausea.  30 tablet  2  . Probiotic Product (PROBIOTIC DAILY PO) Take by mouth.       No facility-administered encounter medications on file as of 08/21/2012.    Allergy:  Allergies  Allergen Reactions  . Ampicillin Itching  . Fentanyl And Related Hives  . Neosporin (Neomycin-Bacitracin Zn-Polymyx) Swelling and Other (See Comments)    opthalmic solution only can use topically. Swelling and inflamed eyes  . Septra (Sulfamethoxazole-Tmp Ds) Rash and Other (See Comments)    High fever, Burning skin    Social Hx:   History   Social History  . Marital Status: Single    Spouse Name: N/A    Number of Children: N/A  . Years of Education: N/A   Occupational History  . Not on file.   Social  History Main Topics  . Smoking status: Former Smoker -- 30 years    Quit date: 04/20/2012  . Smokeless tobacco: Not on file     Comment: quit for a couple years then started back  . Alcohol Use: No  . Drug Use: No  . Sexually Active: Not on file   Other Topics Concern  . Not on file   Social History Narrative  . No narrative on file    Past Surgical Hx:  Past Surgical History  Procedure Laterality Date  . Appendectomy  1966    ruptured, hospital for 3 weeks  . Rhinoplasty  1976  . Vein ligation and stripping  1998    right leg  . Mohs surgery  2004, 2005    basal cell of the face    Past Medical Hx:  Past Medical History  Diagnosis Date  . Arthritis   . Acute venous embolism and thrombosis of unspecified deep vessels of lower extremity   . Bronchitis   . Abdominal or pelvic swelling, mass or lump, unspecified site   . Phlebitis and thrombophlebitis of unspecified site   . Pelvic mass   . Skin cancer   . Ovarian cancer     Family Hx:  Family History  Problem Relation Age of Onset  . Pneumonia Mother   . Heart attack Father     Vitals:  Blood pressure 132/60, pulse 88, temperature 99.4 F (37.4 C), resp. rate 22, height 5' 7" (1.702 m), weight 139 lb 1.6 oz (63.095 kg).  Physical Exam: Thin female in no acute distress.  Neck: Supple, no lymphadenopathy, no thyromegaly.  Lungs: Clear to auscultation bilaterally.  Cardiovascular exam: Regular rate and rhythm.  Abdomen: Distended. She's a vertical midline incision. There is no rebound or guarding. There is no fluid wave. There is no ascites.  Groins: No lymphadenopathy.  Extremities: Varicosities left greater than right 1+ nonpitting edema on the left greater than right.  Pelvic: Normal external female genitalia. Bimanual examination reveals about a centimeter mass in the midline. There is no nodularity. 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 I had a lengthy discussion with the patient as well as her significant other. Greater than 45 minutes face to face time was spent with the patient and her partner.  I reviewed   her CT scan in detail with her today. We went to every positive finding on the scan. I discussed with her and her partner that we are at the decision point. The options are to continue with her chemotherapy versus proceeding with surgery. My recommendation would be to proceed with surgery. After lengthy discussion they agree with this. She understands that we'll need to place an IVC filter and has been scheduled for May 14. Her surgery is tentatively scheduled for May 20. She will need to come off of her Xarelto and transition to Lovenox as a bridge 3 filter placement as well as preoperatively. We'll then postoperatively manage her on Lovenox before transitioning her back to her Zarelto. She understands that we will proceed with chemotherapy after her surgery as well.  Risks of surgery including but not limited to bleeding infection injury to surrounding organs and thromboembolic disease were discussed with the patient. She stay she's more fearful of the IVC filter placement and she is of the surgery. 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 Their numerous questions were answered to their satisfaction. She will contact me if she has any questions prior to her surgery date.  Blondie Riggsbee A., MD 08/21/2012, 12:12 PM  

## 2012-08-21 NOTE — Patient Instructions (Signed)
Inferior Vena Cava Filter Insertion Insertion of an inferior vena cava (IVC) filter is usually a safe procedure. It is a procedure using a filter designed to help prevent blood clots in the legs or pelvis from traveling to the lungs. A large blood clot in the lungs can cause death. The risks involved are usually small and easily managed. This device is only used when blood thinners (anticoagulants) cannot be used. Some of these reasons may be:  Severe platelet problems or shortage.  Recent or current major bleeding which cannot be treated.  Bleeding associated with anticoagulants.  Recurrence of blood clot while on anticoagulants.  The need for surgery in the near future.  Bleeding in the head. The filter is a small, metal device about an inch long. It is shaped like the spokes of an umbrella. The filter is placed in the inferior vena cava. The inferior vena cava is the large vein which returns blood from the lower body to the heart.  Blood clots are sometimes treated with blood thinning drugs called anti-coagulants. Filters are used when blood thinners may not be enough. Your caregivers will discuss these issues with you. Together you can determine the best course of treatment to take. EXPECTATIONS OF A FILTER  An IVC filter will reduce the risk, not eliminate it and cannot prevent small PE's.  It does not stop deep vein clots from growing, recurring, or developing the postphlebitic syndrome. RISKS AND COMPLICATIONS  Vena cava filter insertion is a very safe procedure. Occasionally a small bruise forms around the needle insertion. A larger accumulation of blood called a hematoma may form. This is usually of no concern.  Continued bleeding or infections are uncommon.  Rarely damage is done to the vein by the catheter. This may require surgery or repair. There is a possibility that the filter can cause blockage of the vena cava. This can cause some swelling of the legs. There is a  possibility that the filter will eventually fail and not work properly. Despite some problems, the procedure is usually safe and carried out without them PROCEDURE   The procedure usually takes about one half to one hour but this can vary.  A specialist in reading x-rays (radiologist) usually performs this procedure.  It is usually performed in a special room in the x-ray department. It is often done in a hospital or same-day surgical center. You will be asked to put on a hospital gown.  Let your caregivers know of all allergies. This is very important if you have reacted to a dye given through the vein (intravenous) used for x-rays.  Do not eat or drink for four hours before the procedure or as instructed by your caregiver.  Sedatives are sometimes given to relieve anxiety.  The procedure is often done through the femoral vein (big vein in the groin). The skin around this area is usually shaved.  You will lie on the X-ray table, generally flat on your back. You need to have a needle put into a vein in your arm, so that the radiologist can give you medications (sedatives or painkillers).  An oxygen monitoring device may be attached to your finger. Oxygen may be given.  Everything is kept as sterile as possible during the procedure. The skin near the point of needle insertion will be cleaned with antiseptic and the area draped with sterile towels.  The skin and deeper tissues over the vein will be made numb with a local anesthetic. This is a medication like  Novocaine. You are awake during the procedure and can let your caregivers know if you have discomfort.  A needle is then put into the vein. A guide wire is placed through the needle and into the vein. This is used to help insert a catheter into your vein. The radiologist uses the X-ray equipment to make sure that the catheter and the wire are in the correct position. The wire is then withdrawn. The filter can then be released from the  catheter and left in place in the vena cava.  The catheter is then removed. Pressure will be kept on the needle insertion point for several minutes or until it is unlikely to bleed. SEEK IMMEDIATE MEDICAL CARE IF:   You develop swelling and discoloration or pain in the legs.  Your legs become pale and cold or blue.  You develop shortness of breath, feel faint or pass out.  You develop chest pain, cough, difficulty breathing or cough up blood.  You develop a rash or feel you are having problems which may be a side effect of medications given.  You develop weakness, difficulty moving your arms or legs or balance problems.  You develop problems with speech or vision. Document Released: 05/31/2005 Document Revised: 07/03/2011 Document Reviewed: 05/20/2007 [facility] Patient Information 2013 [place], [place].

## 2012-08-22 ENCOUNTER — Telehealth: Payer: Self-pay | Admitting: Gynecologic Oncology

## 2012-08-22 ENCOUNTER — Telehealth: Payer: Self-pay | Admitting: *Deleted

## 2012-08-22 HISTORY — PX: VENA CAVA FILTER PLACEMENT: SUR1032

## 2012-08-22 NOTE — Telephone Encounter (Signed)
Message copied by Kathlynn Grate on Thu Aug 22, 2012 12:24 PM ------      Message from: Reece Packer      Created: Wed Aug 21, 2012  5:40 PM       Chene Kasinger: this lady will not have more chemo before gyn onc surgery 09-10-12 and I have asked schedulers to cancel treatment and labs on 5-9 and 5-16. I am glad to see her if she wants to keep my appointment on 5-7 + lab, or if she is feeling well and as she just saw gyn onc today, I am ok cancelling my apt + lab 5-7.  Could you please speak with her by phone and cancel if not needed.   Thanks. ------

## 2012-08-22 NOTE — Telephone Encounter (Signed)
Patient informed of CA 125 results.  No concerns voiced.  Instructed to call for any needs.

## 2012-08-22 NOTE — Telephone Encounter (Signed)
Called and spoke to patient regarding change in plan of care. Patient states she is comfortable with decisions at appt yesterday for upcoming surgery. She has no further questions at this time for Dr Darrold Span, so we will cancel appts at this time. Patient understands she has no further lab/chemo/MD appts with Med Onc or Dr Darrold Span at this time and will get in touch with [place] after surgery to have these rescheduled and resume chemotherapy after surgery. Patient requested results of last CA125, results now down to 92.2.

## 2012-08-23 ENCOUNTER — Ambulatory Visit: Payer: Medicare Other

## 2012-08-23 ENCOUNTER — Other Ambulatory Visit: Payer: Medicare Other | Admitting: Lab

## 2012-08-26 ENCOUNTER — Encounter (HOSPITAL_COMMUNITY): Payer: Self-pay | Admitting: Pharmacy Technician

## 2012-08-28 ENCOUNTER — Ambulatory Visit: Payer: Medicare Other | Admitting: Oncology

## 2012-08-28 ENCOUNTER — Other Ambulatory Visit: Payer: Self-pay | Admitting: Radiology

## 2012-08-28 ENCOUNTER — Other Ambulatory Visit: Payer: Medicare Other | Admitting: Lab

## 2012-08-30 ENCOUNTER — Ambulatory Visit: Payer: Medicare Other

## 2012-08-30 ENCOUNTER — Other Ambulatory Visit: Payer: Medicare Other | Admitting: Lab

## 2012-09-04 ENCOUNTER — Ambulatory Visit (HOSPITAL_COMMUNITY)
Admission: RE | Admit: 2012-09-04 | Discharge: 2012-09-04 | Disposition: A | Discharge status: Home / Self Care | Payer: Medicare Other | Source: Ambulatory Visit | Attending: Gynecologic Oncology | Admitting: Gynecologic Oncology

## 2012-09-04 ENCOUNTER — Encounter (HOSPITAL_COMMUNITY): Payer: Self-pay

## 2012-09-04 DIAGNOSIS — I82409 Acute embolism and thrombosis of unspecified deep veins of unspecified lower extremity: Secondary | ICD-10-CM | POA: Insufficient documentation

## 2012-09-04 DIAGNOSIS — C569 Malignant neoplasm of unspecified ovary: Secondary | ICD-10-CM

## 2012-09-04 LAB — CBC
HCT: 34.9 % — ABNORMAL LOW (ref 36.0–46.0)
MCH: 28.8 pg (ref 26.0–34.0)
MCV: 87.5 fL (ref 78.0–100.0)
Platelets: 152 10*3/uL (ref 150–400)
RBC: 3.99 MIL/uL (ref 3.87–5.11)

## 2012-09-04 LAB — BASIC METABOLIC PANEL
CO2: 25 mEq/L (ref 19–32)
Calcium: 9.4 mg/dL (ref 8.4–10.5)
Chloride: 105 mEq/L (ref 96–112)
Glucose, Bld: 102 mg/dL — ABNORMAL HIGH (ref 70–99)
Sodium: 137 mEq/L (ref 135–145)

## 2012-09-04 MED ORDER — FENTANYL CITRATE 0.05 MG/ML IJ SOLN
INTRAMUSCULAR | Status: AC
  Filled 2012-09-04: qty 2

## 2012-09-04 MED ORDER — IOHEXOL 300 MG/ML  SOLN
50.0000 mL | Freq: Once | INTRAMUSCULAR | Status: AC | PRN
  Administered 2012-09-04: 60 mL via INTRAVENOUS

## 2012-09-04 MED ORDER — MIDAZOLAM HCL 2 MG/2ML IJ SOLN
INTRAMUSCULAR | Status: AC
  Filled 2012-09-04: qty 2

## 2012-09-04 MED ORDER — MIDAZOLAM HCL 2 MG/2ML IJ SOLN
INTRAMUSCULAR | Status: AC | PRN
  Administered 2012-09-04: 2 mg via INTRAVENOUS

## 2012-09-04 MED ORDER — SODIUM CHLORIDE 0.9 % IV SOLN: INTRAVENOUS | Status: DC

## 2012-09-04 NOTE — Progress Notes (Signed)
Dagmar Hait NP into speak w patient.

## 2012-09-04 NOTE — H&P (Signed)
Chief Complaint: "I'm here for a filter" Referring Physician:Gehrig HPI: Monique [name] is an 67 y.o. female with ovarian cancer who is to undergo surgery next week. She developed a DVT earlier this year and has been on Xarelto. She will be coming off the Xarelto prior to surgery and will placed on a Lovenox. As a precaution and to protect her from possible pulmonary embolus during this process, IR is asked to place and IVC filter pre-operatively. PMHx and meds reviewed. Pt feels well otherwise.  Past Medical History:  Past Medical History  Diagnosis Date  . Arthritis   . Acute venous embolism and thrombosis of unspecified deep vessels of lower extremity   . Bronchitis   . Abdominal or pelvic swelling, mass or lump, unspecified site   . Phlebitis and thrombophlebitis of unspecified site   . Pelvic mass   . Skin cancer   . Ovarian cancer     Past Surgical History:  Past Surgical History  Procedure Laterality Date  . Appendectomy  1966    ruptured, hospital for 3 weeks  . Rhinoplasty  1976  . Vein ligation and stripping  1998    right leg  . Mohs surgery  2004, 2005    basal cell of the face    Family History:  Family History  Problem Relation Age of Onset  . Pneumonia Mother   . Heart attack Father     Social History:  reports that she quit smoking about 4 months ago. She does not have any smokeless tobacco history on file. She reports that she does not drink alcohol or use illicit drugs.  Allergies:  Allergies  Allergen Reactions  . Ampicillin Itching  . Fentanyl And Related Hives  . Neosporin (Neomycin-Bacitracin Zn-Polymyx) Swelling and Other (See Comments)    opthalmic solution only can use topically. Swelling and inflamed eyes  . Septra (Sulfamethoxazole-Tmp Ds) Rash and Other (See Comments)    High fever, Burning skin    Medications: Multiple Vitamin (MULTIVITAMIN WITH MINERALS) TABS (Taking) Sig - Route: Take 1 tablet by mouth daily. - Oral Class: Historical  Med pantoprazole (PROTONIX) 40 MG tablet (Taking) 30 tablet 1 08/20/2012 Sig - Route: Take 1 tablet (40 mg total) by mouth daily. - Oral Number of times this order has been changed since signing: 1 Order [address] Rivaroxaban (XARELTO) 20 MG TABS (Taking) 06/17/2012 Sig - Route: Take 20 mg by mouth every morning. - Oral Class: Historical Med Number of times this order has been changed since signing: 2 Order [address] aspirin EC 81 MG tablet Sig - Route: Take 81 mg by mouth daily. - Oral Class: Historical Med   Please HPI for pertinent positives, otherwise complete 10 system ROS negative.  Physical Exam: Blood pressure 112/59, pulse 76, temperature 97.7 F (36.5 C), resp. rate 20, SpO2 99.00%. There is no weight on file to calculate BMI.   General Appearance:  Alert, cooperative, no distress, appears stated age  Head:  Normocephalic, without obvious abnormality, atraumatic  ENT: Unremarkable  Neck: Supple, symmetrical, trachea midline  Lungs:   Clear to auscultation bilaterally, no w/r/r, respirations unlabored without use of accessory muscles.  Heart:  Regular rate and rhythm, S1, S2 normal, no murmur, rub or gallop.   Abdomen:   Soft, non-tender, non distended. Bowel sounds active all four quadrants,  no masses, no organomegaly.  Extremities: Extremities normal, atraumatic, no cyanosis or edema  Neurologic: Normal affect, no gross deficits.   Results for orders placed during the  hospital encounter of 09/04/12 (from the past 48 hour(s))  APTT     Status: None   Collection Time    09/04/12  7:50 AM      Result Value Range   aPTT 28  24 - 37 seconds  BASIC METABOLIC PANEL     Status: Abnormal   Collection Time    09/04/12  7:50 AM      Result Value Range   Sodium 137  135 - 145 mEq/L   Potassium 4.5  3.5 - 5.1 mEq/L   Comment: SLIGHT HEMOLYSIS     HEMOLYSIS AT THIS LEVEL MAY AFFECT RESULT   Chloride 105  96 - 112 mEq/L   CO2 25  19 - 32 mEq/L   Glucose, Bld 102 (*) 70 - 99 mg/dL    BUN 14  6 - 23 mg/dL   Creatinine, Ser 4.09  0.50 - 1.10 mg/dL   Calcium 9.4  8.4 - 81.1 mg/dL   GFR calc non Af Amer >90  >90 mL/min   GFR calc Af Amer >90  >90 mL/min   Comment:            The eGFR has been calculated     using the CKD EPI equation.     This calculation has not been     validated in all clinical     situations.     eGFR's persistently     <90 mL/min signify     possible Chronic Kidney Disease.  CBC     Status: Abnormal   Collection Time    09/04/12  7:50 AM      Result Value Range   WBC 4.1  4.0 - 10.5 K/uL   RBC 3.99  3.87 - 5.11 MIL/uL   Hemoglobin 11.5 (*) 12.0 - 15.0 g/dL   HCT 91.4 (*) 78.2 - 95.6 %   MCV 87.5  78.0 - 100.0 fL   MCH 28.8  26.0 - 34.0 pg   MCHC 33.0  30.0 - 36.0 g/dL   RDW 21.3 (*) 08.6 - 57.8 %   Platelets 152  150 - 400 K/uL  PROTIME-INR     Status: None   Collection Time    09/04/12  7:50 AM      Result Value Range   Prothrombin Time 12.8  11.6 - 15.2 seconds   INR 0.97  0.00 - 1.49   No results found.  Assessment/Plan Ovarian cancer LE DVT, on Xarelto For surgical intervention next week. Discussed placement of retrievable IVC filter. Explained procedure, risks, complications, use of sedation. We discussed her past rxn to Fentanyl and that we will probably go with single agent(Versed) and local. Labs reviewed. Consent signed in chart  Brayton El PA-C 09/04/2012, 9:09 AM

## 2012-09-04 NOTE — Procedures (Signed)
Successful placement of an infrarenal IVC filter.  No immediate complications.  

## 2012-09-05 ENCOUNTER — Other Ambulatory Visit (HOSPITAL_COMMUNITY): Payer: Self-pay | Admitting: Gynecologic Oncology

## 2012-09-05 ENCOUNTER — Other Ambulatory Visit: Payer: Self-pay | Admitting: Oncology

## 2012-09-05 NOTE — Progress Notes (Signed)
03-14-2010 STRESS TEST [facility] ON CHART

## 2012-09-05 NOTE — Patient Instructions (Addendum)
21 Monique [name]  09/05/2012   Your procedure is scheduled on: 09-10-2012  Report to [facility] at  815 AM.  Call this number if you have problems the morning of surgery [phone]   Remember:   Do not eat food After Midnight Sunday night              Clear liquids all day monday 09-09-2012     Take these medicines the morning of surgery with A SIP OF WATER: protonix                                SEE [place] PREPARING FOR SURGERY SHEET   Do not wear jewelry, make-up or nail polish.  Do not wear lotions, powders, or perfumes. You may wear deodorant.   Men may shave face and neck.  Do not bring valuables to the hospital.  Contacts, dentures or bridgework may not be worn into surgery.  Leave suitcase in the car. After surgery it may be brought to your room.  For patients admitted to the hospital, checkout time is 11:00 AM the day of discharge.    Please read over the following fact sheets that you were given: MRSA Information, blood fact sheet, incentive spirometer fact sheet, clear liquids fact sheet  Call Birdie Sons RN pre op nurse if needed [phone]    FAILURE TO FOLLOW THESE INSTRUCTIONS MAY RESULT IN THE CANCELLATION OF YOUR SURGERY. PATIENT SIGNATURE___________________________________________

## 2012-09-06 ENCOUNTER — Other Ambulatory Visit: Payer: Medicare Other | Admitting: Lab

## 2012-09-06 ENCOUNTER — Encounter (HOSPITAL_COMMUNITY)
Admission: RE | Admit: 2012-09-06 | Discharge: 2012-09-06 | Disposition: A | Discharge status: Home / Self Care | Payer: Medicare Other | Source: Ambulatory Visit | Attending: Obstetrics & Gynecology | Admitting: Obstetrics & Gynecology

## 2012-09-06 ENCOUNTER — Encounter (HOSPITAL_COMMUNITY): Payer: Self-pay

## 2012-09-06 ENCOUNTER — Ambulatory Visit: Payer: Medicare Other

## 2012-09-06 DIAGNOSIS — Z0183 Encounter for blood typing: Secondary | ICD-10-CM | POA: Insufficient documentation

## 2012-09-06 DIAGNOSIS — Z01812 Encounter for preprocedural laboratory examination: Secondary | ICD-10-CM | POA: Insufficient documentation

## 2012-09-06 DIAGNOSIS — C569 Malignant neoplasm of unspecified ovary: Secondary | ICD-10-CM | POA: Insufficient documentation

## 2012-09-06 HISTORY — DX: Other specified postprocedural states: Z98.890

## 2012-09-06 HISTORY — DX: Anemia, unspecified: D64.9

## 2012-09-06 HISTORY — DX: Anxiety disorder, unspecified: F41.9

## 2012-09-06 HISTORY — DX: Nausea with vomiting, unspecified: R11.2

## 2012-09-06 HISTORY — DX: Gastro-esophageal reflux disease without esophagitis: K21.9

## 2012-09-06 LAB — CBC WITH DIFFERENTIAL/PLATELET
Eosinophils Absolute: 0.1 10*3/uL (ref 0.0–0.7)
Eosinophils Relative: 2 % (ref 0–5)
Hemoglobin: 11.1 g/dL — ABNORMAL LOW (ref 12.0–15.0)
Lymphocytes Relative: 21 % (ref 12–46)
Lymphs Abs: 1 10*3/uL (ref 0.7–4.0)
MCH: 29.1 pg (ref 26.0–34.0)
MCV: 88 fL (ref 78.0–100.0)
Monocytes Relative: 11 % (ref 3–12)
Neutrophils Relative %: 67 % (ref 43–77)
RBC: 3.82 MIL/uL — ABNORMAL LOW (ref 3.87–5.11)
WBC: 4.8 10*3/uL (ref 4.0–10.5)

## 2012-09-06 LAB — COMPREHENSIVE METABOLIC PANEL
ALT: 13 U/L (ref 0–35)
Alkaline Phosphatase: 103 U/L (ref 39–117)
BUN: 15 mg/dL (ref 6–23)
CO2: 28 mEq/L (ref 19–32)
GFR calc Af Amer: >90 mL/min (ref >90)
GFR calc non Af Amer: >90 mL/min (ref >90)
Glucose, Bld: 99 mg/dL (ref 70–99)
Potassium: 4.3 mEq/L (ref 3.5–5.1)
Sodium: 138 mEq/L (ref 135–145)
Total Protein: 7.1 g/dL (ref 6.0–8.3)

## 2012-09-06 LAB — SURGICAL PCR SCREEN
MRSA, PCR: NEGATIVE
Staphylococcus aureus: NEGATIVE

## 2012-09-06 LAB — ABO/RH: ABO/RH(D): A POS

## 2012-09-06 NOTE — Progress Notes (Addendum)
Office visit note and holter monitor note on chart, PT and PTT 09/04/12 on EPIC, CBC and BMET 09/04/12 on EPIC, chest CT 08/19/12 on EPIC.

## 2012-09-10 ENCOUNTER — Encounter (HOSPITAL_COMMUNITY)
Admission: RE | Disposition: A | Discharge status: Home / Self Care | Payer: Self-pay | Source: Ambulatory Visit | Attending: Obstetrics & Gynecology

## 2012-09-10 ENCOUNTER — Inpatient Hospital Stay (HOSPITAL_COMMUNITY)
Admission: RE | Admit: 2012-09-10 | Discharge: 2012-09-13 | DRG: 737 | Disposition: A | Discharge status: Home / Self Care | Payer: Medicare Other | Source: Ambulatory Visit | Attending: Obstetrics & Gynecology | Admitting: Obstetrics & Gynecology

## 2012-09-10 ENCOUNTER — Other Ambulatory Visit: Payer: Self-pay | Admitting: Oncology

## 2012-09-10 ENCOUNTER — Encounter (HOSPITAL_COMMUNITY): Payer: Self-pay | Admitting: *Deleted

## 2012-09-10 ENCOUNTER — Inpatient Hospital Stay (HOSPITAL_COMMUNITY): Payer: Medicare Other | Admitting: Anesthesiology

## 2012-09-10 ENCOUNTER — Encounter (HOSPITAL_COMMUNITY): Payer: Self-pay | Admitting: Anesthesiology

## 2012-09-10 DIAGNOSIS — C786 Secondary malignant neoplasm of retroperitoneum and peritoneum: Secondary | ICD-10-CM | POA: Diagnosis present

## 2012-09-10 DIAGNOSIS — Z86718 Personal history of other venous thrombosis and embolism: Secondary | ICD-10-CM

## 2012-09-10 DIAGNOSIS — N736 Female pelvic peritoneal adhesions (postinfective): Secondary | ICD-10-CM | POA: Diagnosis present

## 2012-09-10 DIAGNOSIS — Z87891 Personal history of nicotine dependence: Secondary | ICD-10-CM

## 2012-09-10 DIAGNOSIS — I83893 Varicose veins of bilateral lower extremities with other complications: Secondary | ICD-10-CM | POA: Diagnosis present

## 2012-09-10 DIAGNOSIS — N135 Crossing vessel and stricture of ureter without hydronephrosis: Secondary | ICD-10-CM | POA: Diagnosis present

## 2012-09-10 DIAGNOSIS — C569 Malignant neoplasm of unspecified ovary: Principal | ICD-10-CM | POA: Diagnosis present

## 2012-09-10 DIAGNOSIS — R188 Other ascites: Secondary | ICD-10-CM | POA: Diagnosis present

## 2012-09-10 DIAGNOSIS — Z01812 Encounter for preprocedural laboratory examination: Secondary | ICD-10-CM

## 2012-09-10 DIAGNOSIS — D62 Acute posthemorrhagic anemia: Secondary | ICD-10-CM | POA: Diagnosis present

## 2012-09-10 HISTORY — PX: EXPLORATORY LAPAROTOMY: SUR591

## 2012-09-10 HISTORY — PX: LAPAROTOMY: SHX154

## 2012-09-10 LAB — TYPE AND SCREEN: ABO/RH(D): A POS

## 2012-09-10 SURGERY — LAPAROTOMY, EXPLORATORY
Anesthesia: General | Wound class: Clean

## 2012-09-10 MED ORDER — LIDOCAINE HCL (CARDIAC) 20 MG/ML IV SOLN
INTRAVENOUS | Status: DC | PRN
  Administered 2012-09-10: 50 mg via INTRAVENOUS

## 2012-09-10 MED ORDER — KCL IN DEXTROSE-NACL 20-5-0.45 MEQ/L-%-% IV SOLN
INTRAVENOUS | Status: DC
  Administered 2012-09-10 – 2012-09-12 (×5): via INTRAVENOUS
  Filled 2012-09-10 (×7): qty 1000

## 2012-09-10 MED ORDER — ACETAMINOPHEN 10 MG/ML IV SOLN
INTRAVENOUS | Status: DC | PRN
  Administered 2012-09-10: 1000 mg via INTRAVENOUS

## 2012-09-10 MED ORDER — ZOLPIDEM TARTRATE 5 MG PO TABS: 5.0000 mg | ORAL_TABLET | Freq: Every evening | ORAL | Status: DC | PRN

## 2012-09-10 MED ORDER — BUPIVACAINE LIPOSOME 1.3 % IJ SUSP
20.0000 mL | Freq: Once | INTRAMUSCULAR | Status: DC
  Filled 2012-09-10: qty 20

## 2012-09-10 MED ORDER — NEOSTIGMINE METHYLSULFATE 1 MG/ML IJ SOLN
INTRAMUSCULAR | Status: DC | PRN
  Administered 2012-09-10: 3.5 mg via INTRAVENOUS

## 2012-09-10 MED ORDER — GLYCOPYRROLATE 0.2 MG/ML IJ SOLN
INTRAMUSCULAR | Status: DC | PRN
  Administered 2012-09-10: .7 mg via INTRAVENOUS
  Administered 2012-09-10: 0.2 mg via INTRAVENOUS

## 2012-09-10 MED ORDER — MEPERIDINE HCL 50 MG/ML IJ SOLN: 6.2500 mg | INTRAMUSCULAR | Status: DC | PRN

## 2012-09-10 MED ORDER — ACETAMINOPHEN 10 MG/ML IV SOLN
1000.0000 mg | Freq: Four times a day (QID) | INTRAVENOUS | Status: AC
  Administered 2012-09-10 (×2): 1000 mg via INTRAVENOUS
  Filled 2012-09-10 (×2): qty 100

## 2012-09-10 MED ORDER — FENTANYL CITRATE 0.05 MG/ML IJ SOLN: 25.0000 ug | INTRAMUSCULAR | Status: DC | PRN

## 2012-09-10 MED ORDER — SODIUM CHLORIDE 0.9 % IJ SOLN
INTRAMUSCULAR | Status: DC | PRN
  Administered 2012-09-10: 13:00:00

## 2012-09-10 MED ORDER — HYDROMORPHONE HCL PF 1 MG/ML IJ SOLN
INTRAMUSCULAR | Status: DC | PRN
  Administered 2012-09-10 (×2): 1 mg via INTRAVENOUS
  Administered 2012-09-10: 0.5 mg via INTRAVENOUS
  Administered 2012-09-10: 1 mg via INTRAVENOUS
  Administered 2012-09-10: 0.5 mg via INTRAVENOUS

## 2012-09-10 MED ORDER — METRONIDAZOLE IN NACL 5-0.79 MG/ML-% IV SOLN
500.0000 mg | INTRAVENOUS | Status: AC
Start: 2012-09-10 — End: 2012-09-10
  Administered 2012-09-10: 500 mg via INTRAVENOUS
  Filled 2012-09-10: qty 100

## 2012-09-10 MED ORDER — ENOXAPARIN SODIUM 40 MG/0.4ML ~~LOC~~ SOLN
40.0000 mg | SUBCUTANEOUS | Status: AC
Start: 2012-09-10 — End: 2012-09-10
  Administered 2012-09-10: 40 mg via SUBCUTANEOUS
  Filled 2012-09-10: qty 0.4

## 2012-09-10 MED ORDER — ONDANSETRON HCL 4 MG/2ML IJ SOLN
INTRAMUSCULAR | Status: DC | PRN
  Administered 2012-09-10: 4 mg via INTRAVENOUS

## 2012-09-10 MED ORDER — FENTANYL CITRATE 0.05 MG/ML IJ SOLN
INTRAMUSCULAR | Status: DC | PRN
  Administered 2012-09-10: 100 ug via INTRAVENOUS
  Administered 2012-09-10: 50 ug via INTRAVENOUS
  Administered 2012-09-10 (×2): 25 ug via INTRAVENOUS
  Administered 2012-09-10: 50 ug via INTRAVENOUS

## 2012-09-10 MED ORDER — CIPROFLOXACIN IN D5W 400 MG/200ML IV SOLN
400.0000 mg | INTRAVENOUS | Status: AC
Start: 2012-09-10 — End: 2012-09-10
  Administered 2012-09-10: 400 mg via INTRAVENOUS

## 2012-09-10 MED ORDER — STERILE WATER FOR IRRIGATION IR SOLN
Status: DC | PRN
  Administered 2012-09-10: 1500 mL

## 2012-09-10 MED ORDER — LABETALOL HCL 5 MG/ML IV SOLN
INTRAVENOUS | Status: DC | PRN
  Administered 2012-09-10: 2.5 mg via INTRAVENOUS

## 2012-09-10 MED ORDER — PROMETHAZINE HCL 25 MG/ML IJ SOLN: 6.2500 mg | INTRAMUSCULAR | Status: DC | PRN

## 2012-09-10 MED ORDER — LACTATED RINGERS IV SOLN
INTRAVENOUS | Status: DC | PRN
  Administered 2012-09-10 (×3): via INTRAVENOUS

## 2012-09-10 MED ORDER — PROPOFOL 10 MG/ML IV BOLUS
INTRAVENOUS | Status: DC | PRN
  Administered 2012-09-10: 120 mg via INTRAVENOUS

## 2012-09-10 MED ORDER — ONDANSETRON HCL 4 MG PO TABS: 4.0000 mg | ORAL_TABLET | Freq: Four times a day (QID) | ORAL | Status: DC | PRN

## 2012-09-10 MED ORDER — OXYCODONE-ACETAMINOPHEN 5-325 MG PO TABS
1.0000 | ORAL_TABLET | ORAL | Status: DC | PRN
  Administered 2012-09-11: 1 via ORAL
  Administered 2012-09-11 (×3): 2 via ORAL
  Administered 2012-09-12 – 2012-09-13 (×6): 1 via ORAL
  Filled 2012-09-10 (×6): qty 1
  Filled 2012-09-10 (×3): qty 2
  Filled 2012-09-10: qty 1

## 2012-09-10 MED ORDER — ROCURONIUM BROMIDE 100 MG/10ML IV SOLN
INTRAVENOUS | Status: DC | PRN
  Administered 2012-09-10 (×2): 5 mg via INTRAVENOUS
  Administered 2012-09-10: 40 mg via INTRAVENOUS
  Administered 2012-09-10: 10 mg via INTRAVENOUS

## 2012-09-10 MED ORDER — MAGNESIUM HYDROXIDE 400 MG/5ML PO SUSP
30.0000 mL | Freq: Three times a day (TID) | ORAL | Status: DC
  Filled 2012-09-10: qty 30

## 2012-09-10 MED ORDER — 0.9 % SODIUM CHLORIDE (POUR BTL) OPTIME
TOPICAL | Status: DC | PRN
  Administered 2012-09-10: 2000 mL

## 2012-09-10 MED ORDER — EPHEDRINE SULFATE 50 MG/ML IJ SOLN
INTRAMUSCULAR | Status: DC | PRN
  Administered 2012-09-10 (×2): 5 mg via INTRAVENOUS

## 2012-09-10 MED ORDER — LACTATED RINGERS IV SOLN: INTRAVENOUS | Status: DC

## 2012-09-10 MED ORDER — ONDANSETRON HCL 4 MG/2ML IJ SOLN
4.0000 mg | Freq: Four times a day (QID) | INTRAMUSCULAR | Status: DC | PRN
  Administered 2012-09-10 – 2012-09-11 (×2): 4 mg via INTRAVENOUS
  Filled 2012-09-10 (×2): qty 2

## 2012-09-10 MED ORDER — HYDROMORPHONE HCL PF 1 MG/ML IJ SOLN
0.5000 mg | INTRAMUSCULAR | Status: DC | PRN
  Administered 2012-09-11: 0.5 mg via INTRAVENOUS
  Filled 2012-09-10: qty 1

## 2012-09-10 SURGICAL SUPPLY — 41 items
ATTRACTOMAT 16X20 MAGNETIC DRP (DRAPES) ×2 IMPLANT
BAG URINE DRAINAGE (UROLOGICAL SUPPLIES) IMPLANT
BLADE EXTENDED COATED 6.5IN (ELECTRODE) ×2 IMPLANT
CANISTER SUCTION 2500CC (MISCELLANEOUS) ×2 IMPLANT
CATH FOLEY 2WAY SLVR  5CC 16FR (CATHETERS)
CATH FOLEY 2WAY SLVR 5CC 16FR (CATHETERS) IMPLANT
CLIP TI MEDIUM LARGE 6 (CLIP) ×6 IMPLANT
CLOTH BEACON ORANGE TIMEOUT ST (SAFETY) ×2 IMPLANT
COVER SURGICAL LIGHT HANDLE (MISCELLANEOUS) ×2 IMPLANT
DRAPE TABLE BACK 44X90 PK DISP (DRAPES) IMPLANT
DRAPE UTILITY XL STRL (DRAPES) ×2 IMPLANT
DRAPE WARM FLUID 44X44 (DRAPE) ×2 IMPLANT
DRSG TELFA 4X14 ISLAND ADH (GAUZE/BANDAGES/DRESSINGS) ×2 IMPLANT
ELECT REM PT RETURN 9FT ADLT (ELECTROSURGICAL) ×2
ELECTRODE REM PT RTRN 9FT ADLT (ELECTROSURGICAL) ×1 IMPLANT
GAUZE SPONGE 4X4 16PLY XRAY LF (GAUZE/BANDAGES/DRESSINGS) IMPLANT
GLOVE BIO SURGEON STRL SZ 6.5 (GLOVE) ×4 IMPLANT
GLOVE BIO SURGEON STRL SZ7.5 (GLOVE) ×4 IMPLANT
GLOVE BIOGEL PI IND STRL 7.0 (GLOVE) ×1 IMPLANT
GLOVE BIOGEL PI INDICATOR 7.0 (GLOVE) ×1
HOLDER FOLEY CATH W/STRAP (MISCELLANEOUS) ×2 IMPLANT
LIGASURE IMPACT 36 18CM CVD LR (INSTRUMENTS) ×2 IMPLANT
NEEDLE HYPO 25X1 1.5 SAFETY (NEEDLE) ×2 IMPLANT
NS IRRIG 1000ML POUR BTL (IV SOLUTION) ×4 IMPLANT
PACK ABDOMINAL WL (CUSTOM PROCEDURE TRAY) ×2 IMPLANT
SHEET LAVH (DRAPES) ×2 IMPLANT
SLEEVE SURGEON STRL (DRAPES) ×2 IMPLANT
SPONGE LAP 18X18 X RAY DECT (DISPOSABLE) ×4 IMPLANT
STAPLER VISISTAT 35W (STAPLE) ×2 IMPLANT
SUT PDS AB 1 CTXB1 36 (SUTURE) ×4 IMPLANT
SUT SILK 3 0 SH CR/8 (SUTURE) ×2 IMPLANT
SUT VIC AB 0 CT1 36 (SUTURE) ×16 IMPLANT
SUT VIC AB 2-0 CT2 27 (SUTURE) IMPLANT
SUT VIC AB 2-0 SH 27 (SUTURE) ×1
SUT VIC AB 2-0 SH 27X BRD (SUTURE) ×1 IMPLANT
SUT VICRYL 2 0 18  UND BR (SUTURE) ×1
SUT VICRYL 2 0 18 UND BR (SUTURE) ×1 IMPLANT
SYR CONTROL 10ML LL (SYRINGE) ×2 IMPLANT
TOWEL OR 17X26 10 PK STRL BLUE (TOWEL DISPOSABLE) ×2 IMPLANT
TOWEL OR NON WOVEN STRL DISP B (DISPOSABLE) ×2 IMPLANT
TRAY FOLEY BAG SILVER LF 14FR (CATHETERS) ×2 IMPLANT

## 2012-09-10 NOTE — Anesthesia Postprocedure Evaluation (Signed)
  Anesthesia Post-op Note  Patient: Monique [name]  Procedure(s) Performed: Procedure(s) (LRB): EXPLORATORY LAPAROTOMY, BILATERAL SALPINGO OOPHORECTOMY, TUMOR DEBULKING (N/A)  Patient Location: PACU  Anesthesia Type: General  Level of Consciousness: awake and alert   Airway and Oxygen Therapy: Patient Spontanous Breathing  Post-op Pain: mild  Post-op Assessment: Post-op Vital signs reviewed, Patient's Cardiovascular Status Stable, Respiratory Function Stable, Patent Airway and No signs of Nausea or vomiting  Last Vitals:  Filed Vitals:   09/10/12 1356  BP:   Pulse: 82  Temp: 36.2 C  Resp: 18    Post-op Vital Signs: stable   Complications: No apparent anesthesia complications

## 2012-09-10 NOTE — Interval H&P Note (Signed)
History and Physical Interval Note:  09/10/2012 9:26 AM  Monique [name]  has presented today for surgery, with the diagnosis of OVARIAN CANCER  The various methods of treatment have been discussed with the patient and family. After consideration of risks, benefits and other options for treatment, the patient has consented to  Procedure(s): EXPLORATORY LAPAROTOMY, TOTAL ABDOMINAL HYSTERECTOMY, BILATERAL SALPINGO OOPHORECTOMY, DEBULKING (N/A) as a surgical intervention .  The patient's history has been reviewed, patient examined, no change in status, stable for surgery.  I have reviewed the patient's chart and labs.  Questions were answered to the patient's satisfaction.     Dewell Monnier A.

## 2012-09-10 NOTE — Progress Notes (Signed)
Utilization review completed.  

## 2012-09-10 NOTE — Transfer of Care (Signed)
Immediate Anesthesia Transfer of Care Note  Patient: Monique [name]  Procedure(s) Performed: Procedure(s) (LRB): EXPLORATORY LAPAROTOMY, BILATERAL SALPINGO OOPHORECTOMY, TUMOR DEBULKING (N/A)  Patient Location: PACU  Anesthesia Type: General  Level of Consciousness: sedated, patient cooperative and responds to stimulaton  Airway & Oxygen Therapy: Patient Spontanous Breathing and Patient connected to face mask oxgen  Post-op Assessment: Report given to PACU RN and Post -op Vital signs reviewed and stable  Post vital signs: Reviewed and stable  Complications: No apparent anesthesia complications

## 2012-09-10 NOTE — Op Note (Signed)
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  Postoperative Diagnosis: same.   Surgery: Exploratory laparotomy, extensive lysis of adhesions. Left ureteral lysis  For retroperitoneal fibrosis, bilateral salpingo-oophorectomy, suboptimal tumor debulking.  Surgeons:  Bernita Buffy. Duard Brady, MD; Antionette Char, MD   Assistant: Telford Nab   Anesthesia: General   Estimated blood loss: 500 ml   IVF: 2000 ml   Urine output: 400 ml   Complications: None   Pathology: Bilateral adnexa to pathology. Frozen section of the right adnexa revealed an adenocarcinoma. Could not exclude the possibility of metastatic disease from a site that was extraovarian.  Operative findings: Diffuse carcinomatosis of the residual omentum involving the entire transverse colon. Transverse colon densely carried to the anterior abdominal wall. No disease throughout the small bowel. Mesenteric adenopathy with lymph nodes measuring 1 cm. Feces involving the root of the small bowel mesentery. It centimeter right ovarian mass and a 6 cm left ovarian mass. Small bowel densely had to the pelvis. 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 At the conclusion of the procedure the patient still had disease involving the omentum and transverse colon, disease in the root of small bowel mesentery and starting with small volume disease along the right hemidiaphragm.  Procedure: The patient was identified in the preoperative holding area. Informed consent was signed on the chart. Patient was seen history was reviewed and exam was performed.   The patient was then taken to the operating room and placed in the supine position with SCD hose on. General anesthesia was then induced without difficulty. She was then placed in the dorsolithotomy position. The perineum and vagina were prepped with Betadine. A 14  French foley was inserted into the bladder under sterile conditions. The abdomen was prepped with 1 chlor prep sponge per protocol. After the prep was dried the patient was then draped in usual fashion. Timeout was performed to confirm the patient, procedure, antibiotic, and allergy status. A vertical midline incision was made. In the lips was made to incorporate her prior right paramedian incision. There is incision in the adhesions scar tissue in the subcutaneous tissues was wedged out. Using Bovie cautery the subcutaneous tissues were dissected to the fascia. The fascial score the fascial incision was extended superiorly and inferiorly. The rectus bellies were dissected off the underlying fashion. Will ask this to the peritoneal cavity. The peritoneum was tented entered. At this point adhesions of the omentum and the transverse colon to the anterior abdominal wall was identified. The decision was made to extend the incision from the umbilical region to the supraumbilical region which was done with a knife. The fascial incision was extended to allow improved access the peritoneal cavity.  The fascia and peritoneum involving the transverse colon was dissected free. This allowed exploration of the abdomen. Attention was first drawn to exploration of  the pelvis. Meticulous dissection was used to free up the small bowel that was densely adherent to the descending colon as well as the right adnexa was taken down using sharp dissection. The round ligament on the right side was transected and the posterior leaf of the broad ligament was opened. The ureter was identified and a window was made between the infundibulopelvic vessels and the ureter. The IP was clamped x2 transected and suture ligated. Using careful dissection as the right ovary was adherent to the rectosigmoid colon was dissected free of the colon with  no colotomy. We came across the utero-ovarian with a curved Tresa Endo, it was transected and suture ligated  with a transfixion suture of 0 Vicryl. The right adnexa was sent for frozen section.  The descending colon was densely here and to the bladder and the fundus of the uterus. Adhesive disease was taken down sharply. After approximately 45 minutes we're able to identify the retroperitoneum on the patient's left side. The ureter was densely adherent with retroperitoneal fibrosis to the left adnexal mass. We dissected along the white line of Toldt to the splenic flexure. This allowed mobilization of the descending colon and identification of the ureter. A window was made between the ureter and the ovarian vessels the ovarian vessels with suture ligated and transected. We were then able to mobilize the ovarian mass that was completely underneath the descending colon and it was delivered without difficulty. The ureter was noted to be lateral and peristalsing freely. The uterus was left in situ.  Continued exploration of the abdomen revealed that in order to render the patient optimally debulked, she would require resection of the distal ileum including the ileocecal junction, the descending and transverse colon. She would required an ileal descending anastomosis and there was concern of her having a short gut syndrome due to the amount of small bowel that we need to be removed. At this point the decision was made to not proceed with any further debulking surgery as a large volume disease was removed and the remainder for disease it was fibrotic, miliary disease. The Buchwalter retractor was removed from the patient. The abdomen pelvis were copiously irrigated and all pedicles were noted to be hemostatic. The fascia was closed with a running #1 PDS in a mass closure. The subcutaneous tissues were irrigated and made hemostatic. They're reapproximated with 2-0 suture at 2-0 Vicryl. The skin was closed using skin clips. Exparel was infiltrated for postoperative pain control. The patient tolerated the procedure well the  room stable condition.  All instrument, needle, laparotomy pad and Ray-Tec counts were correct x2. The patient tolerated the procedure well and was taken to the recovery room in stable condition. This is Cleda Mccreedy dictating an operative note on patient Monique [name].

## 2012-09-10 NOTE — Anesthesia Preprocedure Evaluation (Signed)
Anesthesia Evaluation  Patient identified by MRN, date of birth, ID band Patient awake    Reviewed: Allergy & Precautions, H&P , NPO status , Patient's Chart, lab work & pertinent test results  History of Anesthesia Complications (+) PONV  Airway Mallampati: II TM Distance: >3 FB Neck ROM: Full    Dental no notable dental hx.    Pulmonary neg pulmonary ROS,  breath sounds clear to auscultation  Pulmonary exam normal       Cardiovascular DVT negative cardio ROS  Rhythm:Regular Rate:Normal     Neuro/Psych negative neurological ROS  negative psych ROS   GI/Hepatic Neg liver ROS, GERD-  Medicated and Controlled,  Endo/Other  negative endocrine ROS  Renal/GU negative Renal ROS  negative genitourinary   Musculoskeletal negative musculoskeletal ROS (+)   Abdominal   Peds negative pediatric ROS (+)  Hematology negative hematology ROS (+)   Anesthesia Other Findings Multiple crowns  Reproductive/Obstetrics negative OB ROS                           Anesthesia Physical Anesthesia Plan  ASA: II  Anesthesia Plan: General   Post-op Pain Management:    Induction: Intravenous  Airway Management Planned: Oral ETT  Additional Equipment:   Intra-op Plan:   Post-operative Plan: Extubation in OR  Informed Consent: I have reviewed the patients History and Physical, chart, labs and discussed the procedure including the risks, benefits and alternatives for the proposed anesthesia with the patient or authorized representative who has indicated his/her understanding and acceptance.   Dental advisory given  Plan Discussed with: CRNA  Anesthesia Plan Comments:         Anesthesia Quick Evaluation

## 2012-09-10 NOTE — Progress Notes (Signed)
Dr Acey Lav aware of allergy to Fentanyl.  MD wants to continue to use Fentanyl.

## 2012-09-10 NOTE — H&P (View-Only) (Signed)
Consult Note: Gyn-Onc  Monique [name] 67 y.o. female  CC:  Chief Complaint  Patient presents with  . Ovarian Cancer    Follow up    HPI:  Patient is a 67 year old gravida 0 who in May 2013 had some sharp right-sided pain. 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 She also had an onset constipation and blame this on a poor diet. She began taking Citrucel which should help her symptoms somewhat. But 3 months later she finally sought gastroenterologist for scheduling her for colonoscopy. She had n/v with the prep and she was unable to tolerate the colonoscopy and that procedure which was  delayed for 7 weeks after the initial consultation was then pushed back for an additional 3 weeks. She again took a different prep and has a nausea vomiting and a colonoscopy had to be rescheduled again with the thought being that this would not be scheduled in January. She ultimately never had a colonoscopy done. She did think that she had some component of an irritable bowel syndrome she started taking probiotic her symptoms got better.   In February she was diagnosed with probable phlebitis had an initial peripheral venous Doppler that was negative. She began having increased pain in her left thigh had repeat Doppler showed a DVT on her left side. This subsequently prompted a CT scan which she had on February 15. It revealed a small right-sided pleural effusion. There is a focal pulmonary embolism in the right lower lobe of the pulmonary artery. There is extensive ascites at the abdomen and pelvis. There was a mass in the anterior segment of the right lobe of the liver near the dome measuring 1.6 x 1.4 cm. There were no other focal liver lesions identified. The spleen, pancreas, and adrenals appeared normal. There were no renal masses or hydronephrosis. There is a mass abutting the omentum lateral to the dome of the liver on the  right measuring 3.4 x 2.3 cm. There was diffuse thickening of the omentum in the right upper quadrant anteriorly and laterally. Within the pelvis there is a complex mass was cystic and solid components measuring 12.8 x 6.6 cm. It crosses the midline and extends to both the right and left sides. There is a deep venous thrombosis in the pelvic vessels. The inferior vena cava is diminutive and felt to be involved with thrombus. There multiple small retroperitoneal lymph nodes. There was an enlarged left inguinal lymph node measuring 1.8 x 1.6 cm and a right inguinal lymph node measuring 2.2 x 1.5 cm. CA 125 was drawn and was elevated at 569.9. It is for this reason that she comes in today. She was initially treated with Lovenox for 5 days and is currently on Xarelto.  She had a high-volume paracentesis by interventional radiology on February 19 for 2.8 L. Cytology was consistent with adenocarcinoma. She began neoadjuvant dose dense carboplatin and paclitaxel on February 28.  She is status post 3 cycles of dose dense paclitaxel and carboplatin. Carboplatin was given at an AUC of 4 for cycle 1 and then increase to an AUC of 5 for cycle 2. She completed day 15 of cycle #3 on April 25. She had a CT scan at that time that revealed:  CT CHEST, ABDOMEN AND PELVIS WITH CONTRAST   CT CHEST  Findings: There is no axillary lymphadenopathy. 15 mm thyroid nodule identified in the left gland.  Other scattered smaller thyroid nodules are seen bilaterally. Scattered small lymph nodes are seen in the mediastinum. No hilar lymphadenopathy. Heart size is normal. No pericardial or pleural effusion. 11 mm soft tissue nodule is seen in the upper outer quadrant of the right breast.  Lung windows revealed no focal airspace consolidation. No parenchymal nodule or mass.  Bone windows reveal no worrisome lytic or sclerotic osseous lesions.  IMPRESSION:  No evidence of pleural effusion.  11 mm nodule in the upper outer quadrant of  the right breast.  15 mm left thyroid nodule. Thyroid ultrasound may prove helpful to  further evaluate.  CT ABDOMEN AND PELVIS  Findings: The volume of intraperitoneal disease has decreased substantially in the interval. The peritoneal thickening and nodularity seen previously has nearly resolved. The omental disease has decreased substantially. There is only a thin rim of disease adjacent to the liver and spleen on the current study. The 17 mm subcapsular anterior liver lesion seen on the previous study now measures only 8 mm.  The stomach, duodenum, pancreas, gallbladder, and adrenal glands are unremarkable. Kidneys have normal imaging features.  No abdominal aortic aneurysm. No retroperitoneal lymphadenopathy.   Imaging through the pelvis shows no free fluid. The large complex left adnexal cystic mass has decreased, measuring 6.8 x 5.2 cm  today compared to 9.2 x 6.9 cm previously. The cystic lesion in the left adnexal space has also decreased, measuring approximately  3.7 x 2.6 cm today compared to 6.5 x 5.6 cm previously. There is some mild wall thickening and apparent tethering in the region of the sigmoid colon which may be post-treatment in etiology. Bone windows reveal no worrisome lytic or sclerotic osseous lesions.   IMPRESSION:  Substantial interval improvement in response to therapy. The marked peritoneal thickening and nodularity has nearly resolved and the omental disease seen previously has improved substantially. 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  Bilateral cystic adnexal masses are improved.  Her CA 125 has also decreased from 1041 to 188.   Review of Systems: She is feeling much better. She has a subjective improvement in her ascites and overall feels much stronger than she did before her surgery. She occasionally has a stitch in her right side but has been long-standing. She is some slightly loose stools after her CT scan. She has a change  about bladder habits. She has a chest pain, shortness of breath, nausea, vomiting, fevers, chills. Has a slight yellow discharge from her nostril today in a low-grade fever 99.4. She has a chest pain or shortness of breath. 10 point review of systems is otherwise negative. She is a retired Producer, television/film/video. She Pap smear in 2011 that was negative. She never had a colonoscopy. She quit smoking December 2013 when she had an episode of bronchitis.  Current Meds:  Outpatient Encounter Prescriptions as of 08/21/2012  Medication Sig Dispense Refill  . dexamethasone (DECADRON) 4 MG tablet TAKE 5 TABLETS BY MOUTH WITH FOOD EVERY 12 HOURS THEN 6 HOURS BEFORE CHEMO TREATMENT  30 tablet  2  . Multiple Vitamins-Minerals (MULTIVITAMIN PO) Take 1 tablet by mouth daily.      . pantoprazole (PROTONIX) 40 MG tablet Take 1 tablet (40 mg total) by mouth daily.  30 tablet  1  . Rivaroxaban (XARELTO) 20 MG TABS Take 20 mg by mouth daily.      Marland Kitchen aspirin EC 81 MG tablet Take 81 mg by mouth daily.      Marland Kitchen LORazepam (ATIVAN) 0.5 MG  tablet Take 1/2 to 1 tablet every 6 hours SL or PO as needed for nausea.  20 tablet  0  . ondansetron (ZOFRAN) 8 MG tablet Take 1 tablet (8 mg total) by mouth every 12 (twelve) hours as needed for nausea.  30 tablet  2  . Probiotic Product (PROBIOTIC DAILY PO) Take by mouth.       No facility-administered encounter medications on file as of 08/21/2012.    Allergy:  Allergies  Allergen Reactions  . Ampicillin Itching  . Fentanyl And Related Hives  . Neosporin (Neomycin-Bacitracin Zn-Polymyx) Swelling and Other (See Comments)    opthalmic solution only can use topically. Swelling and inflamed eyes  . Septra (Sulfamethoxazole-Tmp Ds) Rash and Other (See Comments)    High fever, Burning skin    Social Hx:   History   Social History  . Marital Status: Single    Spouse Name: N/A    Number of Children: N/A  . Years of Education: N/A   Occupational History  . Not on file.   Social  History Main Topics  . Smoking status: Former Smoker -- 30 years    Quit date: 04/20/2012  . Smokeless tobacco: Not on file     Comment: quit for a couple years then started back  . Alcohol Use: No  . Drug Use: No  . Sexually Active: Not on file   Other Topics Concern  . Not on file   Social History Narrative  . No narrative on file    Past Surgical Hx:  Past Surgical History  Procedure Laterality Date  . Appendectomy  1966    ruptured, hospital for 3 weeks  . Rhinoplasty  1976  . Vein ligation and stripping  1998    right leg  . Mohs surgery  2004, 2005    basal cell of the face    Past Medical Hx:  Past Medical History  Diagnosis Date  . Arthritis   . Acute venous embolism and thrombosis of unspecified deep vessels of lower extremity   . Bronchitis   . Abdominal or pelvic swelling, mass or lump, unspecified site   . Phlebitis and thrombophlebitis of unspecified site   . Pelvic mass   . Skin cancer   . Ovarian cancer     Family Hx:  Family History  Problem Relation Age of Onset  . Pneumonia Mother   . Heart attack Father     Vitals:  Blood pressure 132/60, pulse 88, temperature 99.4 F (37.4 C), resp. rate 22, height 5\' 7"  (1.702 m), weight 139 lb 1.6 oz (63.095 kg).  Physical Exam: Thin female in no acute distress.  Neck: Supple, no lymphadenopathy, no thyromegaly.  Lungs: Clear to auscultation bilaterally.  Cardiovascular exam: Regular rate and rhythm.  Abdomen: Distended. She's a vertical midline incision. There is no rebound or guarding. There is no fluid wave. There is no ascites.  Groins: No lymphadenopathy.  Extremities: Varicosities left greater than right 1+ nonpitting edema on the left greater than right.  Pelvic: Normal external female genitalia. Bimanual examination reveals about a centimeter mass in the midline. There is no nodularity. Rectovaginal examination reveals a mass to be smooth there is no  nodularity  Assessment/Plan: 67 year old gravida 0 with probable at least stage III if not stage IV ovarian carcinoma. I had a lengthy discussion with the patient as well as her significant other. Greater than 45 minutes face to face time was spent with the patient and her partner.  I reviewed  her CT scan in detail with her today. We went to every positive finding on the scan. I discussed with her and her partner that we are at the decision point. The options are to continue with her chemotherapy versus proceeding with surgery. My recommendation would be to proceed with surgery. After lengthy discussion they agree with this. She understands that we'll need to place an IVC filter and has been scheduled for May 14. Her surgery is tentatively scheduled for May 20. She will need to come off of her Xarelto and transition to Lovenox as a bridge 3 filter placement as well as preoperatively. We'll then postoperatively manage her on Lovenox before transitioning her back to her Zarelto. She understands that we will proceed with chemotherapy after her surgery as well.  Risks of surgery including but not limited to bleeding infection injury to surrounding organs and thromboembolic disease were discussed with the patient. She stay she's more fearful of the IVC filter placement and she is of the surgery. 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 Their numerous questions were answered to their satisfaction. She will contact me if she has any questions prior to her surgery date.  Mirza Kidney A., MD 08/21/2012, 12:12 PM

## 2012-09-11 ENCOUNTER — Telehealth: Payer: Self-pay | Admitting: Oncology

## 2012-09-11 ENCOUNTER — Encounter (HOSPITAL_COMMUNITY): Payer: Self-pay | Admitting: Gynecologic Oncology

## 2012-09-11 LAB — BASIC METABOLIC PANEL
BUN: 7 mg/dL (ref 6–23)
Creatinine, Ser: 0.53 mg/dL (ref 0.50–1.10)
GFR calc non Af Amer: >90 mL/min (ref >90)
Glucose, Bld: 176 mg/dL — ABNORMAL HIGH (ref 70–99)
Potassium: 3.8 mEq/L (ref 3.5–5.1)

## 2012-09-11 LAB — CBC
HCT: 30.1 % — ABNORMAL LOW (ref 36.0–46.0)
Hemoglobin: 9.6 g/dL — ABNORMAL LOW (ref 12.0–15.0)
MCH: 28.4 pg (ref 26.0–34.0)
MCHC: 31.9 g/dL (ref 30.0–36.0)
RDW: 20.1 % — ABNORMAL HIGH (ref 11.5–15.5)

## 2012-09-11 MED ORDER — PANTOPRAZOLE SODIUM 40 MG PO TBEC
40.0000 mg | DELAYED_RELEASE_TABLET | Freq: Every day | ORAL | Status: DC
  Administered 2012-09-11 – 2012-09-13 (×3): 40 mg via ORAL
  Filled 2012-09-11 (×3): qty 1

## 2012-09-11 MED ORDER — POLYETHYLENE GLYCOL 3350 17 G PO PACK
17.0000 g | PACK | Freq: Every day | ORAL | Status: DC
  Administered 2012-09-11 – 2012-09-13 (×3): 17 g via ORAL
  Filled 2012-09-11 (×3): qty 1

## 2012-09-11 MED ORDER — ENOXAPARIN SODIUM 40 MG/0.4ML ~~LOC~~ SOLN
40.0000 mg | SUBCUTANEOUS | Status: DC
  Administered 2012-09-11: 40 mg via SUBCUTANEOUS
  Filled 2012-09-11 (×2): qty 0.4

## 2012-09-11 NOTE — Progress Notes (Signed)
1 Day Post-Op Procedure(s) (LRB): EXPLORATORY LAPAROTOMY, BILATERAL SALPINGO OOPHORECTOMY, TUMOR DEBULKING (N/A)  Subjective: Patient reports tolerating diet.  Ambulating with assist.  Adequate pain relief reported.  She denies chest pain, dyspnea, nausea, emesis, passing flatus, or having a bowel movement.  Requesting Miralax instead of ordered MOM and Protonix.    Objective: Vital signs in last 24 hours: Temp:  [97.2 F (36.2 C)-98.6 F (37 C)] 98.6 F (37 C) (05/21 0547) Pulse Rate:  [53-102] 70 (05/21 0547) Resp:  [15-18] 18 (05/21 0547) BP: (98-138)/(60-74) 103/63 mmHg (05/21 0547) SpO2:  [10 %-100 %] 97 % (05/21 0547) Weight:  [140 lb (63.504 kg)] 140 lb (63.504 kg) (05/21 0823) Last BM Date: 09/09/12  Intake/Output from previous day: 05/20 0701 - 05/21 0700 In: 5247.1 [P.O.:720; I.V.:4327.1; IV Piggyback:200] Out: 3525 [Urine:3025; Blood:500]  Physical Examination: General: alert, cooperative and no distress Resp: clear to auscultation bilaterally Cardio: regular rate and rhythm, S1, S2 normal, no murmur, click, rub or gallop GI: soft, non-tender; bowel sounds normal; no masses,  no organomegaly and incision: midline incision with staples, dressing removed, minimal amount of sanguinous drainage present. Extremities: extremities normal, atraumatic, no cyanosis or edema  Labs: WBC/Hgb/Hct/Plts:  7.5/9.6/30.1/149 (05/21 0410) BUN/Cr/glu/ALT/AST/amyl/lip:  7/0.53/--/--/--/--/-- (05/21 0410)  Assessment: 67 y.o. s/p Procedure(s): EXPLORATORY LAPAROTOMY, BILATERAL SALPINGO OOPHORECTOMY, TUMOR DEBULKING: stable Pain:  Pain is well-controlled on IV PRN and oral medications.  Heme:  Hgb 9.6 and Hct 30.1 this am.  Stable at this time.  Plan to recheck CBC in the am.  CV:  Hx recent LE DVT s/p IVC filter placement.  Lovenox ordered for today.  Repeat CBC in the am.  Plan to resume Xarelto per Dr. Tamela Oddi.  GI:  Tolerating po: Yes.    FEN:  Stable this  am.  Prophylaxis: intermittent pneumatic compression boots.  Plan: Advance diet as tolerated Saline lock if diet tolerated CBC in the am Begin Lovenox 40 mg daily until bridged back to Xarelto per Dr. Tamela Oddi Begin Miralax and Protonix daily Encourage ambulation, IS use, deep breathing, and coughing Continue post-operative plan of care   LOS: 1 day    CROSS, MELISSA DEAL 09/11/2012, 10:12 AM

## 2012-09-11 NOTE — Telephone Encounter (Signed)
lvm fo rpt regarding to June appt....mailed pt appt sched and letter °

## 2012-09-12 LAB — CBC
Hemoglobin: 8.7 g/dL — ABNORMAL LOW (ref 12.0–15.0)
MCH: 29.1 pg (ref 26.0–34.0)
MCV: 90.3 fL (ref 78.0–100.0)
RBC: 2.99 MIL/uL — ABNORMAL LOW (ref 3.87–5.11)
WBC: 7.7 10*3/uL (ref 4.0–10.5)

## 2012-09-12 MED ORDER — RIVAROXABAN 20 MG PO TABS
20.0000 mg | ORAL_TABLET | Freq: Every morning | ORAL | Status: DC
  Administered 2012-09-12: 20 mg via ORAL
  Filled 2012-09-12 (×2): qty 1

## 2012-09-12 MED ORDER — BISACODYL 10 MG RE SUPP
10.0000 mg | Freq: Every day | RECTAL | Status: DC | PRN
  Administered 2012-09-13: 10 mg via RECTAL
  Filled 2012-09-12: qty 1

## 2012-09-12 NOTE — Progress Notes (Signed)
2 Days Post-Op Procedure(s) (LRB): EXPLORATORY LAPAROTOMY, BILATERAL SALPINGO OOPHORECTOMY, TUMOR DEBULKING (N/A)  Subjective: Patient reports tolerating solid food.  Ambulating without assist.  Adequate pain relief with PO pain medications.  Denies chest pain, dyspnea, nausea, emesis, vaginal bleeding, bruising, passing flatus, or having a bowel movement.  No concerns voiced.  Objective: Vital signs in last 24 hours: Temp:  [98.8 F (37.1 C)-99.9 F (37.7 C)] 98.8 F (37.1 C) (05/22 0520) Pulse Rate:  [70-85] 82 (05/22 0520) Resp:  [18] 18 (05/22 0520) BP: (102-114)/(50-65) 102/50 mmHg (05/22 0520) SpO2:  [96 %-100 %] 96 % (05/22 0520) Last BM Date: 09/09/12  Intake/Output from previous day: 05/21 0701 - 05/22 0700 In: 3720 [P.O.:720; I.V.:3000] Out: 4625 [Urine:4625]  Physical Examination: General: alert, cooperative and no distress Resp: clear to auscultation bilaterally Cardio: regular rate and rhythm, S1, S2 normal, no murmur, click, rub or gallop GI: soft, non-tender; bowel sounds normal; no masses,  no organomegaly and incision: midline incision with staples clean, dry, and intact Extremities: extremities normal, atraumatic, no cyanosis or edema  Labs: WBC/Hgb/Hct/Plts:  7.7/8.7/27.0/130 (05/22 0511)    Assessment: 67 y.o. s/p Procedure(s): EXPLORATORY LAPAROTOMY, BILATERAL SALPINGO OOPHORECTOMY, TUMOR DEBULKING: stable Pain:  Pain is well-controlled on oral medications.  Heme:  Hgb 8.7 and Hct 27.0 this am.  Asymptomatic anemia related to acute blood loss related to surgery.  EBL 500.  CV: Hx recent LE DVT s/p IVC filter placement. Repeat CBC in the am.  Resume Xarelto today and discontinue lovenox per Dr. Tamela Oddi.   GI:  Tolerating po: Yes.     Prophylaxis:  PAS hose and Xarelto beginning today.  Plan: CBC in the am Resume Xarelto and discontinue lovenox Discontinue saline lock per pt request Dulcolax suppository PRN Encourage ambulation, IS use, deep  breathing, and coughing Continue post-operative plan of care   LOS: 2 days    CROSS, MELISSA DEAL 09/12/2012, 9:03 AM

## 2012-09-13 LAB — CBC
HCT: 28.1 % — ABNORMAL LOW (ref 36.0–46.0)
Hemoglobin: 9.1 g/dL — ABNORMAL LOW (ref 12.0–15.0)
MCHC: 32.4 g/dL (ref 30.0–36.0)
MCV: 90.4 fL (ref 78.0–100.0)
RDW: 20 % — ABNORMAL HIGH (ref 11.5–15.5)

## 2012-09-13 MED ORDER — OXYCODONE-ACETAMINOPHEN 5-325 MG PO TABS: 1.0000 | ORAL_TABLET | ORAL | Status: DC | PRN

## 2012-09-13 MED ORDER — RIVAROXABAN 20 MG PO TABS
20.0000 mg | ORAL_TABLET | Freq: Every day | ORAL | Status: DC
  Filled 2012-09-13: qty 1

## 2012-09-13 NOTE — Discharge Summary (Signed)
Physician Discharge Summary  Patient ID: Monique [name] MRN: [id number] DOB/AGE: 1946-02-24 67 y.o.  Admit date: 09/10/2012 Discharge date: 09/13/2012  Admission Diagnoses: Ovarian cancer  Discharge Diagnoses:  Principal Problem:   Ovarian cancer  Discharged Condition: The patient is in good condition and stable for discharge.  Hospital Course:  On 09/10/2012, the patient underwent the following: Procedure(s):  EXPLORATORY LAPAROTOMY, BILATERAL SALPINGO OOPHORECTOMY, TUMOR DEBULKING.  The postoperative course was uneventful.  She was discharged to home on postoperative day 3 tolerating a regular diet, passing flatus, and having a bowel movement.  She will be advised to continue taking Xarelto as prescribed.  Consults: None  Significant Diagnostic Studies: None  Treatments: surgery: see above  Discharge Exam: Blood pressure 125/77, pulse 70, temperature 98.1 F (36.7 C), temperature source Oral, resp. rate 18, height 5\' 7"  (1.702 m), weight 140 lb (63.504 kg), SpO2 100.00%. General appearance: alert, cooperative and no distress Resp: clear to auscultation bilaterally Cardio: regular rate and rhythm, S1, S2 normal, no murmur, click, rub or gallop GI: soft, non-tender; bowel sounds normal; no masses,  no organomegaly Extremities: extremities normal, atraumatic, no cyanosis or edema Incision/Wound: Midline incision with staples clean, dry, and intact  Disposition: Home      Discharge Orders   Future Appointments Provider Department Dept Phone   09/17/2012 3:45 PM Paola A. Duard Brady, MD [facility] G[facility] [phone]   09/24/2012 10:00 AM Krista Blue [facility] [phone]   09/24/2012 10:30 AM Reece Packer, MD [facility] [phone]   Future Orders Complete By Expires     Call MD for:  difficulty breathing, headache or visual disturbances  As directed     Call MD for:  extreme fatigue   As directed     Call MD for:  hives  As directed     Call MD for:  persistant dizziness or light-headedness  As directed     Call MD for:  persistant nausea and vomiting  As directed     Call MD for:  redness, tenderness, or signs of infection (pain, swelling, redness, odor or green/yellow discharge around incision site)  As directed     Call MD for:  severe uncontrolled pain  As directed     Call MD for:  temperature >100.4  As directed     Diet - low sodium heart healthy  As directed     Driving Restrictions  As directed     Comments:      No driving for 1-2 weeks.  Do not take narcotics and drive.    Increase activity slowly  As directed     Lifting restrictions  As directed     Comments:      No lifting greater than 10 lbs.    Sexual Activity Restrictions  As directed     Comments:      No sexual activity, nothing in the vagina, for 8 weeks.        Medication List    STOP taking these medications       enoxaparin 100 MG/ML injection  Commonly known as:  LOVENOX      TAKE these medications       aspirin EC 81 MG tablet  Take 81 mg by mouth daily.     multivitamin with minerals Tabs  Take 1 tablet by mouth daily.     oxyCODONE-acetaminophen 5-325 MG per tablet  Commonly known as:  PERCOCET/ROXICET  Take 1-2 tablets by mouth every 4 (four) hours as needed for pain.     pantoprazole 40 MG tablet  Commonly known as:  PROTONIX  Take 1 tablet (40 mg total) by mouth daily.     XARELTO 20 MG Tabs  Generic drug:  Rivaroxaban  Take 20 mg by mouth every morning.       Follow-up Information   Follow up with [facility] A[facility] MD On 09/17/2012. (at 3:45pm at the [facility])    Contact information:   [address] [place] [address] [phone]       Signed: Imogine Carvell DEAL 09/13/2012, 1:35 PM

## 2012-09-13 NOTE — Progress Notes (Addendum)
3 Days Post-Op Procedure(s) (LRB): EXPLORATORY LAPAROTOMY, BILATERAL SALPINGO OOPHORECTOMY, TUMOR DEBULKING (N/A)  Subjective: Patient reports tolerating solid food.  Ambulating without assist.  Adequate pain relief with PO pain medications.  Denies chest pain, dyspnea, nausea, emesis, vaginal bleeding, bruising, passing flatus, or having a bowel movement.  No concerns voiced.  Objective: Vital signs in last 24 hours: Temp:  [98.1 F (36.7 C)-99.6 F (37.6 C)] 98.1 F (36.7 C) (05/23 0558) Pulse Rate:  [70-80] 70 (05/23 0558) Resp:  [18] 18 (05/23 0558) BP: (110-125)/(56-77) 125/77 mmHg (05/23 0558) SpO2:  [99 %-100 %] 100 % (05/23 0558) Last BM Date: 09/09/12  Intake/Output from previous day: 05/22 0701 - 05/23 0700 In: 1080 [P.O.:1080] Out: 3750 [Urine:3750]  Physical Examination: General: alert, cooperative and no distress Resp: clear to auscultation bilaterally Cardio: regular rate and rhythm, S1, S2 normal, no murmur, click, rub or gallop GI: soft, non-tender; bowel sounds normal; no masses,  no organomegaly and incision: midline incision with staples clean, dry, and intact Extremities: extremities normal, atraumatic, no cyanosis or edema  Labs: WBC/Hgb/Hct/Plts:  6.4/9.1/28.1/149 (05/23 0440)    Assessment: 67 y.o. s/p Procedure(s): EXPLORATORY LAPAROTOMY, BILATERAL SALPINGO OOPHORECTOMY, TUMOR DEBULKING: stable Pain:  Pain is well-controlled on oral medications.  Heme:  Hgb 9.1 and Hct 28.1 this am-stable.  Asymptomatic anemia related to acute blood loss related to surgery.  EBL 500.  CV: Hx recent LE DVT s/p IVC filter placement. On Xarelto.   GI:  Tolerating po: Yes.     Prophylaxis:  PAS hose and Xarelto.  Plan: Dulcolax suppository this am Encourage ambulation, IS use, deep breathing, and coughing Continue post-operative plan of care Plan for discharge when +flatus or +bowel movement   LOS: 3 days    Hulan Szumski DEAL 09/13/2012, 9:45 AM

## 2012-09-17 ENCOUNTER — Ambulatory Visit: Payer: Medicare Other | Attending: Gynecologic Oncology | Admitting: Gynecologic Oncology

## 2012-09-17 ENCOUNTER — Encounter: Payer: Self-pay | Admitting: Gynecologic Oncology

## 2012-09-17 VITALS — BP 108/64 | HR 80 | Temp 98.9°F | Resp 20 | Ht 67.0 in | Wt 135.1 lb

## 2012-09-17 DIAGNOSIS — C569 Malignant neoplasm of unspecified ovary: Secondary | ICD-10-CM

## 2012-09-17 DIAGNOSIS — Z87891 Personal history of nicotine dependence: Secondary | ICD-10-CM | POA: Insufficient documentation

## 2012-09-17 DIAGNOSIS — Z7901 Long term (current) use of anticoagulants: Secondary | ICD-10-CM | POA: Insufficient documentation

## 2012-09-17 DIAGNOSIS — Z86718 Personal history of other venous thrombosis and embolism: Secondary | ICD-10-CM | POA: Insufficient documentation

## 2012-09-17 DIAGNOSIS — Z7982 Long term (current) use of aspirin: Secondary | ICD-10-CM | POA: Insufficient documentation

## 2012-09-17 NOTE — Progress Notes (Signed)
Consult Note: Gyn-Onc  Monique [name] 67 y.o. female  CC:  Chief Complaint  Patient presents with  . Ovarian Cancer    Follow up post-op    HPI:  Patient is a 67 year old gravida 0 who in May 2013 had some sharp right-sided pain. 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 She also had an onset constipation and blame this on a poor diet. She began taking Citrucel which should help her symptoms somewhat. But 3 months later she finally sought gastroenterologist for scheduling her for colonoscopy. She had n/v with the prep and she was unable to tolerate the colonoscopy and that procedure which was  delayed for 7 weeks after the initial consultation was then pushed back for an additional 3 weeks. She again took a different prep and has a nausea vomiting and a colonoscopy had to be rescheduled again with the thought being that this would not be scheduled in January. She ultimately never had a colonoscopy done. She did think that she had some component of an irritable bowel syndrome she started taking probiotic her symptoms got better.   In February she was diagnosed with probable phlebitis had an initial peripheral venous Doppler that was negative. She began having increased pain in her left thigh had repeat Doppler showed a DVT on her left side. This subsequently prompted a CT scan which she had on February 15. It revealed a small right-sided pleural effusion. There is a focal pulmonary embolism in the right lower lobe of the pulmonary artery. There is extensive ascites at the abdomen and pelvis. There was a mass in the anterior segment of the right lobe of the liver near the dome measuring 1.6 x 1.4 cm. There were no other focal liver lesions identified. The spleen, pancreas, and adrenals appeared normal. There were no renal masses or hydronephrosis. There is a mass abutting the omentum lateral to the dome of the  liver on the right measuring 3.4 x 2.3 cm. There was diffuse thickening of the omentum in the right upper quadrant anteriorly and laterally. Within the pelvis there is a complex mass was cystic and solid components measuring 12.8 x 6.6 cm. It crosses the midline and extends to both the right and left sides. There is a deep venous thrombosis in the pelvic vessels. The inferior vena cava is diminutive and felt to be involved with thrombus. There multiple small retroperitoneal lymph nodes. There was an enlarged left inguinal lymph node measuring 1.8 x 1.6 cm and a right inguinal lymph node measuring 2.2 x 1.5 cm. CA 125 was drawn and was elevated at 569.9. It is for this reason that she comes in today. She was initially treated with Lovenox for 5 days and is currently on Xarelto.  She had a high-volume paracentesis by interventional radiology on February 19 for 2.8 L. Cytology was consistent with adenocarcinoma. She began neoadjuvant dose dense carboplatin and paclitaxel on February 28.  She is status post 3 cycles of dose dense paclitaxel and carboplatin. Carboplatin was given at an AUC of 4 for cycle 1 and then increase to an AUC of 5 for cycle 2. She completed day 15 of cycle #3 on April 25. She had a CT scan at that time that revealed:  CT CHEST, ABDOMEN AND PELVIS WITH CONTRAST   CT CHEST  Findings: There is no axillary lymphadenopathy. 15 mm thyroid nodule identified in the left  gland. Other scattered smaller thyroid nodules are seen bilaterally. Scattered small lymph nodes are seen in the mediastinum. No hilar lymphadenopathy. Heart size is normal. No pericardial or pleural effusion. 11 mm soft tissue nodule is seen in the upper outer quadrant of the right breast.  Lung windows revealed no focal airspace consolidation. No parenchymal nodule or mass.  Bone windows reveal no worrisome lytic or sclerotic osseous lesions.  IMPRESSION:  No evidence of pleural effusion.  11 mm nodule in the upper outer  quadrant of the right breast.  15 mm left thyroid nodule. Thyroid ultrasound may prove helpful to  further evaluate.  CT ABDOMEN AND PELVIS  Findings: The volume of intraperitoneal disease has decreased substantially in the interval. The peritoneal thickening and nodularity seen previously has nearly resolved. The omental disease has decreased substantially. There is only a thin rim of disease adjacent to the liver and spleen on the current study. The 17 mm subcapsular anterior liver lesion seen on the previous study now measures only 8 mm.  The stomach, duodenum, pancreas, gallbladder, and adrenal glands are unremarkable. Kidneys have normal imaging features.  No abdominal aortic aneurysm. No retroperitoneal lymphadenopathy.   Imaging through the pelvis shows no free fluid. The large complex left adnexal cystic mass has decreased, measuring 6.8 x 5.2 cm  today compared to 9.2 x 6.9 cm previously. The cystic lesion in the left adnexal space has also decreased, measuring approximately  3.7 x 2.6 cm today compared to 6.5 x 5.6 cm previously. There is some mild wall thickening and apparent tethering in the region of the sigmoid colon which may be post-treatment in etiology. Bone windows reveal no worrisome lytic or sclerotic osseous lesions.   IMPRESSION:  Substantial interval improvement in response to therapy. The marked peritoneal thickening and nodularity has nearly resolved and the omental disease seen previously has improved substantially. 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  Bilateral cystic adnexal masses are improved.  Her CA 125 has also decreased from 1041 to 188 to 92.  She underwent an exploratory laparotomy, extensive lysis of adhesions and BSO on 09/10/12.  Pathology: Bilateral adnexa to pathology. Frozen section of the right adnexa revealed an adenocarcinoma. Could not exclude the possibility of metastatic disease from a site that was extraovarian.   Operative findings: Diffuse carcinomatosis of the residual omentum involving the entire transverse colon. Transverse colon densely carried to the anterior abdominal wall. No disease throughout the small bowel. Mesenteric adenopathy with lymph nodes measuring 1 cm. Feces involving the root of the small bowel mesentery. It centimeter right ovarian mass and a 6 cm left ovarian mass. Small bowel densely had to the pelvis. 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 At the conclusion of the procedure the patient still had disease involving the omentum and transverse colon, disease in the root of small bowel mesentery and starting with small volume disease along the right hemidiaphragm.  Final pathology revealed: 1. Ovary, right - INVASIVE HIGH GRADE SEROUS CARCINOMA, INVOLVING ADJACENT FALLOPIAN TUBE, 5.3 CM. - ANGIOLYMPHATIC INVASION PRESENT. 2. Adhesions - EXTENSIVE ADHESIONS, NO EVIDENCE OF MALIGNANCY. 3. Adnexa - ovary +/- tube, non-neoplastic, left - INVASIVE HIGH GRADE SEROUS CARCINOMA, 5.0 CM, INVOLVING OVARIAN CAPSULE AND ADJACENT FALLOPIAN TUBES.  She comes in today to discuss operative findings, chemotherapy and future plans.  30 minutes face to face time was spent discussing the operative findings, my decisions to not perform an extending debulking (risks of short gut syndrome, tumor in the root of the small bowel  mesentery, etc.). She is very comfortable with our operative decisions.   Review of Systems: She is doing very well.  Pain is well controlled. She is eating well.  Current Meds:  Outpatient Encounter Prescriptions as of 09/17/2012  Medication Sig Dispense Refill  . Multiple Vitamin (MULTIVITAMIN WITH MINERALS) TABS Take 1 tablet by mouth daily.      . pantoprazole (PROTONIX) 40 MG tablet Take 1 tablet (40 mg total) by mouth daily.  30 tablet  1  . Rivaroxaban (XARELTO) 20 MG TABS Take 20 mg by mouth every morning.       Marland Kitchen aspirin EC 81 MG tablet Take 81 mg by mouth daily.       Marland Kitchen oxyCODONE-acetaminophen (PERCOCET/ROXICET) 5-325 MG per tablet Take 1-2 tablets by mouth every 4 (four) hours as needed for pain.  40 tablet  0   No facility-administered encounter medications on file as of 09/17/2012.    Allergy:  Allergies  Allergen Reactions  . Ampicillin Itching  . Fentanyl And Related Hives    Per Dr. Acey Lav, pt reports she is ok with fentanyl (09/10/12)  . Neosporin (Neomycin-Bacitracin Zn-Polymyx) Swelling and Other (See Comments)    opthalmic solution only can use topically. Swelling and inflamed eyes  . Septra (Sulfamethoxazole-Tmp Ds) Rash and Other (See Comments)    High fever, Burning skin    Social Hx:   History   Social History  . Marital Status: Single    Spouse Name: N/A    Number of Children: N/A  . Years of Education: N/A   Occupational History  . Not on file.   Social History Main Topics  . Smoking status: Former Smoker -- 30 years    Types: Cigarettes    Quit date: 04/20/2012  . Smokeless tobacco: Never Used     Comment: quit for a couple years then started back  . Alcohol Use: No  . Drug Use: No  . Sexually Active: Not on file   Other Topics Concern  . Not on file   Social History Narrative  . No narrative on file    Past Surgical Hx:  Past Surgical History  Procedure Laterality Date  . Appendectomy  1966    ruptured, hospital for 3 weeks  . Rhinoplasty  1976  . Vein ligation and stripping  1998    right leg  . Mohs surgery  2004, 2005    basal cell of the face  . Tooth extraction    . Laparotomy N/A 09/10/2012    Procedure: EXPLORATORY LAPAROTOMY, BILATERAL SALPINGO OOPHORECTOMY, TUMOR DEBULKING;  Surgeon: Rejeana Brock A. Duard Brady, MD;  Location: [facility];  Service: Gynecology;  Laterality: N/A;  . Exploratory laparotomy  09/10/12    Lysis of adhesions, BSO, suboptimal tumor debulking    Past Medical Hx:  Past Medical History  Diagnosis Date  . Arthritis   . Acute venous embolism and thrombosis of unspecified deep  vessels of lower extremity   . Bronchitis     several times  . Abdominal or pelvic swelling, mass or lump, unspecified site   . Phlebitis and thrombophlebitis of unspecified site   . Pelvic mass   . Skin cancer   . Ovarian cancer   . Anxiety   . GERD (gastroesophageal reflux disease)   . Anemia   . PONV (postoperative nausea and vomiting)     "when fentanyl is used, will break out in itchy rast"    Family Hx:  Family History  Problem Relation Age of Onset  .  Pneumonia Mother   . Heart attack Father     Vitals:  Blood pressure 108/64, pulse 80, temperature 98.9 F (37.2 C), resp. rate 20, height 5\' 7"  (1.702 m), weight 135 lb 1.6 oz (61.281 kg).  Physical Exam: Thin female in no acute distress.  Abdomen: Incision healing well with steri-strips.  Assessment/Plan: 67 year old gravida 0 with probable at least stage III if not stage IV ovarian carcinoma.  She is suboptimally debulked due to disease in the root of the small bowel mesentery.  I would like to see if she can restart her dose dense chemotherapy next week. I will contact Dr. Darrold Span. I will need to see her after a CT scan after her next three cycles of dose dense chemotherapy.  Carmita Boom A., MD 09/17/2012, 2:35 PM

## 2012-09-24 ENCOUNTER — Ambulatory Visit (HOSPITAL_BASED_OUTPATIENT_CLINIC_OR_DEPARTMENT_OTHER): Payer: Medicare Other | Admitting: Oncology

## 2012-09-24 ENCOUNTER — Other Ambulatory Visit: Payer: Self-pay

## 2012-09-24 ENCOUNTER — Telehealth: Payer: Self-pay | Admitting: *Deleted

## 2012-09-24 ENCOUNTER — Other Ambulatory Visit (HOSPITAL_BASED_OUTPATIENT_CLINIC_OR_DEPARTMENT_OTHER): Payer: Medicare Other | Admitting: Lab

## 2012-09-24 ENCOUNTER — Encounter: Payer: Self-pay | Admitting: Oncology

## 2012-09-24 ENCOUNTER — Telehealth: Payer: Self-pay | Admitting: Oncology

## 2012-09-24 VITALS — BP 103/59 | HR 67 | Temp 98.7°F | Resp 18 | Ht 67.0 in | Wt 136.2 lb

## 2012-09-24 DIAGNOSIS — K219 Gastro-esophageal reflux disease without esophagitis: Secondary | ICD-10-CM

## 2012-09-24 DIAGNOSIS — C569 Malignant neoplasm of unspecified ovary: Secondary | ICD-10-CM

## 2012-09-24 DIAGNOSIS — F17200 Nicotine dependence, unspecified, uncomplicated: Secondary | ICD-10-CM

## 2012-09-24 DIAGNOSIS — I82220 Acute embolism and thrombosis of inferior vena cava: Secondary | ICD-10-CM

## 2012-09-24 LAB — COMPREHENSIVE METABOLIC PANEL (CC13)
AST: 16 U/L (ref 5–34)
Albumin: 2.9 g/dL — ABNORMAL LOW (ref 3.5–5.0)
Alkaline Phosphatase: 112 U/L (ref 40–150)
BUN: 14.4 mg/dL (ref 7.0–26.0)
Glucose: 95 mg/dl (ref 70–99)
Potassium: 3.9 mEq/L (ref 3.5–5.1)
Total Bilirubin: 0.36 mg/dL (ref 0.20–1.20)

## 2012-09-24 LAB — CBC WITH DIFFERENTIAL/PLATELET
Basophils Absolute: 0 10*3/uL (ref 0.0–0.1)
EOS%: 2 % (ref 0.0–7.0)
Eosinophils Absolute: 0.1 10*3/uL (ref 0.0–0.5)
HGB: 10 g/dL — ABNORMAL LOW (ref 11.6–15.9)
LYMPH%: 15.6 % (ref 14.0–49.7)
MCH: 30.3 pg (ref 25.1–34.0)
MCV: 88.8 fL (ref 79.5–101.0)
MONO%: 10.5 % (ref 0.0–14.0)
Platelets: 320 10*3/uL (ref 145–400)
RBC: 3.3 10*6/uL — ABNORMAL LOW (ref 3.70–5.45)
RDW: 19.2 % — ABNORMAL HIGH (ref 11.2–14.5)

## 2012-09-24 MED ORDER — HYOSCYAMINE SULFATE 0.125 MG PO TABS: 0.1250 mg | ORAL_TABLET | ORAL | Status: DC

## 2012-09-24 MED ORDER — DEXAMETHASONE 4 MG PO TABS: ORAL_TABLET | ORAL | Status: DC

## 2012-09-24 MED ORDER — SUCRALFATE 1 G PO TABS: ORAL_TABLET | ORAL | Status: DC

## 2012-09-24 NOTE — Telephone Encounter (Signed)
Per staff message and POF I have scheduled appts.  JMW  

## 2012-09-24 NOTE — Telephone Encounter (Signed)
gv and printed appt sched for pt...MW added tx...emailed Dr. Cleophas Dunker for time on 6.24.14.Marland KitchenMarland Kitchenpt comming back on Friday to get updated sched.Marland KitchenMarland Kitchen

## 2012-09-24 NOTE — Progress Notes (Signed)
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 She is now post 3 cycles of neoadjuvant dose dense carboplatin/ taxol from 2-28 thru 08-16-12, followed by suboptimal interval debulking by Dr Duard Brady [id number]. She tolerated the surgery well, saw Dr Duard Brady on 09-17-12, and plan now is to continue same chemotherapy. She had IVC filter placed preoperatively on 09-04-12 due to extensive IVC and pelvic DVT found on initial scans Feb 2014. She is back on anticoagulation with xarelto now. She has had some intermittent epigastric pain x 3 in ~ past week. She does not have PAC. Marland Kitchen  Oncologic History  Patient developed constipation with some right abdominal pain in May 2013. She saw GI physician in ~ Aug 2013, but was unable to tolerate prep for colonoscopy such that this was never accomplished. Constipation improved with probiotics and Activia. She had abdominal fullness more recently, with early satiety and some reflux symptoms. She developed LLE superficial phlebitis in Jan 2014 (dopplers by PCP negative for DVT then), followed by swelling and discomfort in left medial thigh with venous doppler at [facility] in [facility] on 05-21-12 with acute thrombus left common femoral vein. 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 She was begun on lovenox and transitioned to Xarelto by Dr Everlene Other, presently on xarelto 20 mg daily. CA 125 was 569 initially. She saw Dr Duard Brady on 06-12-12, exam remarkable for distended abdomen with fluid wave and 15 cm fixed mass in pelvis. 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 Neoadjuvant dose dense carbo/taxol was beun 06-21-12 with 3 cycles given thru 08-16-12. She needed neupogen days 2,8 and 16 on this regimen. She had good partial response by CTs 08-19-12 and had improvement in CA 125 from 1041 as baseline for the chemotherapy in late Feb 2014 down to 92 on 08-21-12 after 3 cycles. She went to exploratory laparotomy with extensive lysis of adhesions, left ureteral lysis, and  bilateral salpingo-oophorectomy by Dr Duard Brady at [facility] on 09-10-12, which was suboptimal debulking. Operative findings: Diffuse carcinomatosis of the residual omentum involving the entire transverse colon. Transverse colon densely adherent to the anterior abdominal wall. No disease throughout the small bowel. Mesenteric adenopathy with lymph nodes measuring 1 cm, involving the root of the small bowel mesentery. It centimeter right ovarian mass and a 6 cm left ovarian mass. Small bowel densely adherent to the pelvis. 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   She has been recovering well overall since surgery, with no fever or symptoms of infection, eating regular diet, bowels moving adequately, no dysuria, no bleeding, no swelling in LE tho prominent small varicosities in LE bilaterally.She has had 3 episodes of epigastric pain in past week, described as squeezing, significant pain, lasting up to ~ 2.5 hours before resolving without intervention. She had no associated vomiting. She recalls similar symptoms a year ago, which was in retrospect with onset of symptoms from the gyn cancer; pain then improved with donnatal. Remainder of 10 point Review of Systems negative.  Objective:  Vital signs in last 24 hours:  BP 103/59  Pulse 67  Temp(Src) 98.7 F (37.1 C) (Oral)  Resp 18  Ht 5\' 7"  (1.702 m)  Wt 136 lb 3.2 oz (61.78 kg)  BMI 21.33 kg/m2  Weight is down 3 pounds from prior to surgery. Easily mobile, respirations not labored, looks comfortable  HEENT:PERRLA, sclera clear, anicteric and oropharynx clear, no lesions . Wearing wig LymphaticsCervical, supraclavicular, and axillary nodes normal. No inguinal adenopathy Resp: clear to auscultation bilaterally and normal percussion bilaterally Cardio: regular rate and rhythm GI: soft, not distended, not tender including epigastrium to gentle exam. Midline incision closed, no significant erythema or tenderness. No apprecible HSM or mass Extremities: varicose veins noted on lower legs bilaterally, no pitting edema, cords, tenderness Neuro:no sensory deficits noted Skin without rash or ecchymosis   Lab Results:  Results for orders placed in visit on 09/24/12  CBC WITH DIFFERENTIAL      Result Value Range   WBC 5.8  3.9 - 10.3 10e3/uL   NEUT# 4.1  1.5 - 6.5 10e3/uL   HGB 10.0 (*) 11.6 - 15.9 g/dL   HCT 40.9 (*) 81.1 - 91.4 %   Platelets 320  145 - 400 10e3/uL   MCV 88.8  79.5 - 101.0 fL   MCH 30.3  25.1 - 34.0 pg   MCHC 34.1  31.5 - 36.0 g/dL   RBC 7.82 (*) 9.56 - 2.13 10e6/uL    RDW 19.2 (*) 11.2 - 14.5 %   lymph# 0.9  0.9 - 3.3 10e3/uL   MONO# 0.6  0.1 - 0.9 10e3/uL   Eosinophils Absolute 0.1  0.0 - 0.5 10e3/uL   Basophils Absolute 0.0  0.0 - 0.1 10e3/uL   NEUT% 71.1  38.4 - 76.8 %   LYMPH% 15.6  14.0 - 49.7 %   MONO% 10.5  0.0 - 14.0 %   EOS% 2.0  0.0 - 7.0 %   BASO% 0.8  0.0 - 2.0 %  COMPREHENSIVE METABOLIC PANEL (CC13)      Result Value Range   Sodium 140  136 - 145 mEq/L   Potassium 3.9  3.5 - 5.1 mEq/L   Chloride 104  98 - 107 mEq/L   CO2 29  22 - 29 mEq/L   Glucose 95  70 - 99 mg/dl   BUN 08.6  7.0 - 57.8 mg/dL   Creatinine 0.7  0.6 - 1.1 mg/dL   Total Bilirubin 4.69  0.20 - 1.20 mg/dL   Alkaline Phosphatase 112  40 - 150 U/L   AST 16  5 - 34 U/L   ALT 11  0 - 55 U/L  Total Protein 7.1  6.4 - 8.3 g/dL   Albumin 2.9 (*) 3.5 - 5.0 g/dL   Calcium 9.7  8.4 - 65.7 mg/dL     Studies/Result PATHOLOGY  Accession #: [id number] Collected Date: 09/10/2012 59 REPORT OF SURGICAL PATHOLOGYFINAL DIAGNO 1. Ovary, right - INVASIVE HIGH GRADE SEROUS CARCINOMA, INVOLVING ADJACENT FALLOPIAN TUBE, 5.3 CM. - ANGIOLYMPHATIC INVASION PRESENT. 2. Adhesions - EXTENSIVE ADHESIONS, NO EVIDENCE OF MALIGNANCY. 3. Adnexa - ovary +/- tube, non-neoplastic, left - INVASIVE HIGH GRADE SEROUS CARCINOMA, 5.0 CM, INVOLVING OVARIAN CAPSULE AND ADJACENT FALLOPIAN TUBES. Microscopic Comment 1. OVARY Specimen(s): Bilateral ovaries and fallopian tubes Procedure(s): Bilateral salpingo-oophorectomy Lymph node sampling performed: No Primary tumor site: Bilateral ovaries Ovarian surface involvement: Yes Maximum tumor size (cm): Right ovary: 5.3 cm; left ovary: 5.0 cm Histologic type: Serous carcinoma Grade: High grade Peritoneal implants: N/A Pelvic extension: Involving bilateral fallopian tubes Peritoneal washings: Positive Lymph nodes: number examined N/A ; number positive N/A TNM code: pT2c, pNx FIGO Stage (based on pathologic findings, needs clinical correlation):  IIA (2014 edition) Comments: There are nests and sheets of malignant glandular cells with pleomorphic nuclei and prominent nucleoli. Immunostains were performed and the tumor cells are strongly positive for CK7, WT-1, p53 and p16 and negative for CK20 with appropriate controls. The overall findings are consistent with high grade serous carcinoma. The tumor involves bilateral fallopian tubes with angiolymphatic invasion present.    ULTRASOUND GUIDANCE FOR VASCULAR ACCESS 09-04-12 IVC CATHETERIZATION AND VENOGRAM  IVC FILTER INSERTION  Comparison: None  Medications: Versed 2 mg IV  Contrast: 60 mL Omnipaque-300  Sedation time: 15 minutes  Fluoroscopy time: 3 minutes, 10 seconds  Complications: None immediate  PROCEDURE:  Informed consent was obtained from the patient following  explanation of the procedure, risks, benefits and alternatives.  The patient understands, agrees and consents for the procedure.  All questions were addressed. A time out was performed prior to  the initiation of the procedure.  Maximal barrier sterile technique utilized including caps, mask,  sterile gowns, sterile gloves, large sterile drape, hand hygiene,  and Betadine prep.  Under sterile condition and local anesthesia, right internal  jugular venous access was performed with ultrasound. An ultrasound  image was saved and sent to PACS. Over a guide wire, the IVC  filter delivery sheath and inner dilator were advanced into the IVC  just above the IVC bifurcation. Contrast injection was performed  for an IVC venogram.  Through the delivery sheath, a retrievable Denali IVC filter was  deployed below the level of the renal veins and above the IVC  bifurcation. Limited post deployment venacavagram was performed.  The delivery sheath was removed and hemostasis was obtained with  manual compression. A dressing was placed. The patient tolerated  the procedure well without immediate post procedural complication.   Findings:  The IVC is patent without evidence of thrombus, stenosis, or  occlusion. No variant venous anatomy. Successful placement of  the IVC filter below the level of the renal veins.  IMPRESSION:  Successful ultrasound and fluoroscopic guided placement of an  infrarenal retrievable IVC filter via right jugular approach  NOTE NO IVC CLOT AT TIME OF FILTER PLACEMENT  Medications: I have reviewed the patient's current medications. She will use decadron 20 mg ith food 12 hrs prior to each taxol. Add carafate 1 gm ac/hs to present protonix.Chemo orders placed, with decrease in benadryl to 25 mg due to excessive sedation with 50 mg previously.     Assessment/Plan: 1. 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 Expect to do repeat scans after 3 additional cycles and to see Dr Duard Brady then. CA 125 not sent today as this will be elevated just from the very recent surgery. I do not know if it might be appropriate to consider addition of avastin when she has healed adequately from this surgery, especially as suboptimal debulking (I did not discuss avastin with patient now; will query Dr Duard Brady). 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 Following patient's visit, I discussed timing of IVC removal with interventional radiologist, as Dr Duard Brady recommended to patient that the filter be removed. Per IR MD, filters can be removed at any time, but probably optimal to do this within 6 months if desired. Back on xarelto 3.intermittent epigastric pain: etiology not clear and sounds more severe than gastritis, however as H2 blockers have seemed to help will add carafate now. If significant pain recurs, would need acute evaluation in ED. 4.long tobacco recently discontinued. Will get full CT chest with  reimaging scans .  5.overdue mammograms: no obvious significant concerns on PE  6.ruptured appendix remotely  7.basal cell carcinomas  8. Pneumovax given  9.Advance Directives and HCPOA done, but full support to that point  TIme spent 40 min including >50% discussion and coordination of care. Patient is in agreement with resuming treatment with day 1 cycle 4 on 09-27-12. I will see her back 10-15-12 or sooner if needed.  each taxol.   LIVESAY,LENNIS P, MD   09/24/2012, 12:54 PM

## 2012-09-25 ENCOUNTER — Other Ambulatory Visit: Payer: Self-pay | Admitting: *Deleted

## 2012-09-25 DIAGNOSIS — C569 Malignant neoplasm of unspecified ovary: Secondary | ICD-10-CM

## 2012-09-26 ENCOUNTER — Telehealth: Payer: Self-pay

## 2012-09-26 ENCOUNTER — Other Ambulatory Visit: Payer: Self-pay | Admitting: Oncology

## 2012-09-26 NOTE — Telephone Encounter (Signed)
LM for [name] that Dr. Darrold Span will see her tomorrow either in an exam room or in the infusion area to look at her legs and discuss removal of the IVC filter.

## 2012-09-27 ENCOUNTER — Encounter: Payer: Self-pay | Admitting: Oncology

## 2012-09-27 ENCOUNTER — Telehealth: Payer: Self-pay | Admitting: Oncology

## 2012-09-27 ENCOUNTER — Telehealth: Payer: Self-pay

## 2012-09-27 ENCOUNTER — Other Ambulatory Visit (HOSPITAL_BASED_OUTPATIENT_CLINIC_OR_DEPARTMENT_OTHER): Payer: Medicare Other | Admitting: Lab

## 2012-09-27 ENCOUNTER — Ambulatory Visit (HOSPITAL_BASED_OUTPATIENT_CLINIC_OR_DEPARTMENT_OTHER): Payer: Medicare Other

## 2012-09-27 ENCOUNTER — Ambulatory Visit (HOSPITAL_BASED_OUTPATIENT_CLINIC_OR_DEPARTMENT_OTHER): Payer: Medicare Other | Admitting: Oncology

## 2012-09-27 VITALS — BP 120/62 | HR 66 | Temp 98.3°F

## 2012-09-27 DIAGNOSIS — C569 Malignant neoplasm of unspecified ovary: Secondary | ICD-10-CM

## 2012-09-27 DIAGNOSIS — Z5111 Encounter for antineoplastic chemotherapy: Secondary | ICD-10-CM

## 2012-09-27 DIAGNOSIS — I8390 Asymptomatic varicose veins of unspecified lower extremity: Secondary | ICD-10-CM

## 2012-09-27 DIAGNOSIS — I749 Embolism and thrombosis of unspecified artery: Secondary | ICD-10-CM

## 2012-09-27 DIAGNOSIS — I82409 Acute embolism and thrombosis of unspecified deep veins of unspecified lower extremity: Secondary | ICD-10-CM

## 2012-09-27 LAB — CBC WITH DIFFERENTIAL/PLATELET
Basophils Absolute: 0 10*3/uL (ref 0.0–0.1)
EOS%: 0 % (ref 0.0–7.0)
Eosinophils Absolute: 0 10*3/uL (ref 0.0–0.5)
HCT: 31.6 % — ABNORMAL LOW (ref 34.8–46.6)
HGB: 10.2 g/dL — ABNORMAL LOW (ref 11.6–15.9)
LYMPH%: 26.4 % (ref 14.0–49.7)
MCH: 29.3 pg (ref 25.1–34.0)
MCV: 90.8 fL (ref 79.5–101.0)
MONO%: 3.2 % (ref 0.0–14.0)
NEUT#: 1.9 10*3/uL (ref 1.5–6.5)
NEUT%: 70 % (ref 38.4–76.8)
Platelets: 286 10*3/uL (ref 145–400)

## 2012-09-27 MED ORDER — ONDANSETRON 16 MG/50ML IVPB (CHCC)
16.0000 mg | Freq: Once | INTRAVENOUS | Status: AC
  Administered 2012-09-27: 16 mg via INTRAVENOUS

## 2012-09-27 MED ORDER — SODIUM CHLORIDE 0.9 % IV SOLN
80.0000 mg/m2 | Freq: Once | INTRAVENOUS | Status: AC
  Administered 2012-09-27: 144 mg via INTRAVENOUS
  Filled 2012-09-27: qty 24

## 2012-09-27 MED ORDER — DIPHENHYDRAMINE HCL 50 MG/ML IJ SOLN
25.0000 mg | Freq: Once | INTRAMUSCULAR | Status: AC
  Administered 2012-09-27: 25 mg via INTRAVENOUS

## 2012-09-27 MED ORDER — DEXAMETHASONE SODIUM PHOSPHATE 20 MG/5ML IJ SOLN
20.0000 mg | Freq: Once | INTRAMUSCULAR | Status: AC
  Administered 2012-09-27: 20 mg via INTRAVENOUS

## 2012-09-27 MED ORDER — FAMOTIDINE IN NACL 20-0.9 MG/50ML-% IV SOLN
20.0000 mg | Freq: Once | INTRAVENOUS | Status: AC
  Administered 2012-09-27: 20 mg via INTRAVENOUS

## 2012-09-27 MED ORDER — SODIUM CHLORIDE 0.9 % IV SOLN
Freq: Once | INTRAVENOUS | Status: AC
  Administered 2012-09-27: 11:00:00 via INTRAVENOUS

## 2012-09-27 MED ORDER — SODIUM CHLORIDE 0.9 % IV SOLN
546.0000 mg | Freq: Once | INTRAVENOUS | Status: AC
  Administered 2012-09-27: 550 mg via INTRAVENOUS
  Filled 2012-09-27: qty 55

## 2012-09-27 NOTE — Progress Notes (Signed)
OFFICE PROGRESS NOTE   09/27/2012   Physicians:P.Lazaro Arms, MD ([facility]P [facility] [place] Elpidio Anis, Verdis Prime)   INTERVAL HISTORY:   Patient is seen in infusion area to discuss whether or not to remove IVC filter immediately, this placed by IR on 09-04-12 prior to surgery due to extensive clot in IVC and pelvis at presentation in March 2014. She was on xarelto prior to surgery and this has been resumed. Dr Duard Brady suggested that the IVC filter could be removed now. Patient has resumed chemotherapy as of today, with day 1 cycle 4 dose dense taxol carbo now. Note surgery was suboptimal debulking due to extent of involvement of bowel and that vasculature.   Oncologic History  Patient developed constipation with some right abdominal pain in May 2013. She saw GI physician in ~ Aug 2013, but was unable to tolerate prep for colonoscopy such that this was never accomplished. Constipation improved with probiotics and Activia. She had abdominal fullness more recently, with early satiety and some reflux symptoms. She developed LLE superficial phlebitis in Jan 2014 (dopplers by PCP negative for DVT then), followed by swelling and discomfort in left medial thigh with venous doppler at [facility] in [facility] on 05-21-12 with acute thrombus left common femoral vein. 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 She was begun on lovenox and transitioned to Xarelto by Dr Everlene Other, presently on xarelto 20 mg daily. CA 125 was 569 initially. She saw Dr Duard Brady on 06-12-12, exam remarkable for distended abdomen with fluid wave and 15 cm fixed mass in pelvis. 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 Neoadjuvant dose dense carbo/taxol was beun 06-21-12 with 3 cycles given thru 08-16-12. She needed neupogen days 2,8 and 16 on this regimen. She had good partial response by CTs 08-19-12 and had improvement in CA 125 from 1041 as baseline for the chemotherapy in late Feb 2014 down to 92 on 08-21-12 after 3 cycles. She went to exploratory laparotomy with extensive lysis of adhesions, left ureteral lysis, and bilateral salpingo-oophorectomy by Dr Duard Brady at [facility] on 09-10-12, which was suboptimal debulking. Operative findings: Diffuse carcinomatosis of the residual omentum involving the entire transverse colon. Transverse colon densely adherent to the anterior abdominal wall. No disease throughout the small bowel. Mesenteric adenopathy with lymph nodes measuring 1 cm, involving the root of the small bowel mesentery. It centimeter right ovarian mass and a 6 cm left ovarian mass. Small bowel densely adherent to the pelvis. 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 Pathology [phone]) showed high grade serous carcinoma of bilateral ovaries.  No bleeding. No swelling LE. Extensive varicose veins bilateral LE x years, with  previous vein stripping. No other changes since recent visit, glad to have resumed chemotherapy Remainder of 10 point Review of Systems negative.  Objective:  Vital signs in last 24 hours: 120/62,  66 regular,  98.3,   resp 18 not labored RA Easily mobile even with IV infusing. Alert and appropriate. Bilateral LE with large, prominent varicosities thighs and lower legs, with more confluent tiny superficial varicosities lower legs and feet. One indurated area ~ 3 x 4 cm right calf not hot or tender. No pitting edema.   Lab Results:  Results for orders placed in visit on 09/27/12  CBC WITH DIFFERENTIAL      Result Value Range   WBC 2.8 (*) 3.9 - 10.3 10e3/uL   NEUT# 1.9  1.5 - 6.5 10e3/uL   HGB 10.2 (*) 11.6 - 15.9 g/dL   HCT 95.6 (*) 21.3 - 08.6 %   Platelets 286  145 - 400 10e3/uL   MCV 90.8  79.5 - 101.0 fL   MCH 29.3  25.1 - 34.0 pg   MCHC 32.3  31.5 - 36.0 g/dL   RBC 5.78 (*) 4.69 - 6.29 10e6/uL   RDW 17.1 (*) 11.2 - 14.5 %   lymph# 0.7 (*) 0.9 - 3.3 10e3/uL   MONO# 0.1  0.1 - 0.9 10e3/uL   Eosinophils Absolute 0.0  0.0 - 0.5 10e3/uL   Basophils Absolute 0.0  0.0 - 0.1 10e3/uL   NEUT% 70.0  38.4 - 76.8 %   LYMPH% 26.4  14.0 - 49.7 %   MONO% 3.2  0.0 - 14.0 %   EOS% 0.0  0.0 - 7.0 %   BASO% 0.4  0.0 - 2.0 %     Studies/Results:  No results found.  Medications: I have reviewed the patient's current medications.  We have discussed extent of clot into IVC at her presentation and fact that no residual clot was apparent by imaging with the IVC filter placement. We have discussed hypercoagulable state associated with active cancer, especially of this type; she understands that she has residual cancer despite the recent surgery. We have discussed risk of clotting distal to IVC filter if she is not kept on anticoagulation, and need likely for ongoing anticoagulation even if IVC filter is removed given her history and the situation with the cancer. Tho there is some risk of having  a foreign body in IVC, this also will be protective if she has to come off of anticoagulation in future for some other reason. I have told her that IR suggests removal within 6 months of placement if clear that filter will not be needed, but that they actually can remove even beyond 6 months if needed. We have discussed logistics of removing the filter given her weekly chemo regimen now. I doubt the IVC filter will impact the superficial varicosities unless she does have extensive re-clotting below the filter. By completion of our conversation, she is most comfortable leaving the filter in for now. She understands that this can be addressed again after completion of chemo if she would like. She will continue xarelto.   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 2. IVC filter in preoperatively in May 2014 due to extensive venous thrombosis in IVC and pelvis at presentation. Back on xarelto. See discussion above, leave filter in for now. If she prefers to have this removed in future, best window per IR is within 6 months of placement (by mid Nov 2014). 3. Long tobacco recently DCd 4.superficial varicosities LE as above.  Patient understood discussion well and is in agreement with plan. >50% of this face to face visit was spent in discussion.       LIVESAY,LENNIS P, MD   09/27/2012, 12:25 PM

## 2012-09-27 NOTE — Telephone Encounter (Signed)
Spoke with Juanna Cao IR Scheduler and told her that Dr. Darrold Span was cancelling the IVC filter removal by IR next week. Dr. Darrold Span will wait at least until after all chemotherapy is completed and will let them know if it is to be removed. Inetta Fermo verbalized understanding.

## 2012-09-27 NOTE — Patient Instructions (Addendum)
[facility] Discharge Instructions for Patients Receiving Chemotherapy  Today you received the following chemotherapy agents:  Taxol and Carboplatin  To help prevent nausea and vomiting after your treatment, we encourage you to take your nausea medication as ordered per MD.   If you develop nausea and vomiting that is not controlled by your nausea medication, call the clinic.   BELOW ARE SYMPTOMS THAT SHOULD BE REPORTED IMMEDIATELY:  *FEVER GREATER THAN 100.5 F  *CHILLS WITH OR WITHOUT FEVER  NAUSEA AND VOMITING THAT IS NOT CONTROLLED WITH YOUR NAUSEA MEDICATION  *UNUSUAL SHORTNESS OF BREATH  *UNUSUAL BRUISING OR BLEEDING  TENDERNESS IN MOUTH AND THROAT WITH OR WITHOUT PRESENCE OF ULCERS  *URINARY PROBLEMS  *BOWEL PROBLEMS  UNUSUAL RASH Items with * indicate a potential emergency and should be followed up as soon as possible.  Feel free to call the clinic you have any questions or concerns. The clinic phone number is [phone].    

## 2012-09-27 NOTE — Telephone Encounter (Signed)
gv and printed appt sched and avs for pt  °

## 2012-09-28 ENCOUNTER — Ambulatory Visit (HOSPITAL_BASED_OUTPATIENT_CLINIC_OR_DEPARTMENT_OTHER): Payer: Medicare Other

## 2012-09-28 VITALS — BP 105/45 | HR 60 | Temp 98.1°F

## 2012-09-28 DIAGNOSIS — Z5189 Encounter for other specified aftercare: Secondary | ICD-10-CM

## 2012-09-28 DIAGNOSIS — C569 Malignant neoplasm of unspecified ovary: Secondary | ICD-10-CM

## 2012-09-28 MED ORDER — FILGRASTIM 300 MCG/0.5ML IJ SOLN
300.0000 ug | Freq: Once | INTRAMUSCULAR | Status: AC
  Administered 2012-09-28: 300 ug via SUBCUTANEOUS

## 2012-09-28 NOTE — Patient Instructions (Addendum)

## 2012-10-04 ENCOUNTER — Other Ambulatory Visit (HOSPITAL_BASED_OUTPATIENT_CLINIC_OR_DEPARTMENT_OTHER): Payer: Medicare Other

## 2012-10-04 ENCOUNTER — Ambulatory Visit (HOSPITAL_BASED_OUTPATIENT_CLINIC_OR_DEPARTMENT_OTHER): Payer: Medicare Other

## 2012-10-04 VITALS — BP 112/56 | HR 69 | Temp 98.0°F

## 2012-10-04 DIAGNOSIS — Z5111 Encounter for antineoplastic chemotherapy: Secondary | ICD-10-CM

## 2012-10-04 DIAGNOSIS — C569 Malignant neoplasm of unspecified ovary: Secondary | ICD-10-CM

## 2012-10-04 LAB — CBC WITH DIFFERENTIAL/PLATELET
BASO%: 0 % (ref 0.0–2.0)
MCHC: 32.1 g/dL (ref 31.5–36.0)
MONO#: 0.1 10*3/uL (ref 0.1–0.9)
NEUT#: 2.5 10*3/uL (ref 1.5–6.5)
RBC: 3.47 10*6/uL — ABNORMAL LOW (ref 3.70–5.45)
RDW: 16.2 % — ABNORMAL HIGH (ref 11.2–14.5)
WBC: 3.4 10*3/uL — ABNORMAL LOW (ref 3.9–10.3)
lymph#: 0.8 10*3/uL — ABNORMAL LOW (ref 0.9–3.3)

## 2012-10-04 MED ORDER — SODIUM CHLORIDE 0.9 % IV SOLN
80.0000 mg/m2 | Freq: Once | INTRAVENOUS | Status: AC
  Administered 2012-10-04: 144 mg via INTRAVENOUS
  Filled 2012-10-04: qty 24

## 2012-10-04 MED ORDER — DEXAMETHASONE SODIUM PHOSPHATE 20 MG/5ML IJ SOLN
20.0000 mg | Freq: Once | INTRAMUSCULAR | Status: AC
  Administered 2012-10-04: 20 mg via INTRAVENOUS

## 2012-10-04 MED ORDER — FAMOTIDINE IN NACL 20-0.9 MG/50ML-% IV SOLN
20.0000 mg | Freq: Once | INTRAVENOUS | Status: AC
  Administered 2012-10-04: 20 mg via INTRAVENOUS

## 2012-10-04 MED ORDER — SODIUM CHLORIDE 0.9 % IV SOLN
Freq: Once | INTRAVENOUS | Status: AC
  Administered 2012-10-04: 11:00:00 via INTRAVENOUS

## 2012-10-04 MED ORDER — DIPHENHYDRAMINE HCL 50 MG/ML IJ SOLN
25.0000 mg | Freq: Once | INTRAMUSCULAR | Status: AC
  Administered 2012-10-04: 25 mg via INTRAVENOUS

## 2012-10-04 MED ORDER — ONDANSETRON 8 MG/50ML IVPB (CHCC)
8.0000 mg | Freq: Once | INTRAVENOUS | Status: AC
  Administered 2012-10-04: 8 mg via INTRAVENOUS

## 2012-10-04 NOTE — Patient Instructions (Addendum)
[facility] Discharge Instructions for Patients Receiving Chemotherapy  Today you received the following chemotherapy agents:  Taxol   To help prevent nausea and vomiting after your treatment, we encourage you to take your nausea medication.  Take it as often as prescribed.     If you develop nausea and vomiting that is not controlled by your nausea medication, call the clinic. If it is after clinic hours your family physician or the after hours number for the clinic or go to the Emergency Department.   BELOW ARE SYMPTOMS THAT SHOULD BE REPORTED IMMEDIATELY:  *FEVER GREATER THAN 100.5 F  *CHILLS WITH OR WITHOUT FEVER  NAUSEA AND VOMITING THAT IS NOT CONTROLLED WITH YOUR NAUSEA MEDICATION  *UNUSUAL SHORTNESS OF BREATH  *UNUSUAL BRUISING OR BLEEDING  TENDERNESS IN MOUTH AND THROAT WITH OR WITHOUT PRESENCE OF ULCERS  *URINARY PROBLEMS  *BOWEL PROBLEMS  UNUSUAL RASH Items with * indicate a potential emergency and should be followed up as soon as possible.  Feel free to call the clinic you have any questions or concerns. The clinic phone number is [phone].   I have been informed and understand all the instructions given to me. I know to contact the clinic, my physician, or go to the Emergency Department if any problems should occur. I do not have any questions at this time, but understand that I may call the clinic during office hours   should I have any questions or need assistance in obtaining follow up care.    __________________________________________  _____________  __________ Signature of Patient or Authorized Representative            Date                   Time    __________________________________________ Nurse's Signature    

## 2012-10-04 NOTE — Progress Notes (Signed)
OK to treat with WBC 2.8 per Dr. Darrold Span.  TKF

## 2012-10-05 ENCOUNTER — Ambulatory Visit (HOSPITAL_BASED_OUTPATIENT_CLINIC_OR_DEPARTMENT_OTHER): Payer: Medicare Other

## 2012-10-05 VITALS — BP 104/64 | HR 63 | Temp 97.7°F | Resp 18

## 2012-10-05 DIAGNOSIS — Z5189 Encounter for other specified aftercare: Secondary | ICD-10-CM

## 2012-10-05 DIAGNOSIS — C569 Malignant neoplasm of unspecified ovary: Secondary | ICD-10-CM

## 2012-10-05 MED ORDER — FILGRASTIM 300 MCG/0.5ML IJ SOLN
300.0000 ug | Freq: Once | INTRAMUSCULAR | Status: AC
  Administered 2012-10-05: 300 ug via SUBCUTANEOUS

## 2012-10-11 ENCOUNTER — Ambulatory Visit (HOSPITAL_BASED_OUTPATIENT_CLINIC_OR_DEPARTMENT_OTHER): Payer: Medicare Other

## 2012-10-11 ENCOUNTER — Other Ambulatory Visit (HOSPITAL_BASED_OUTPATIENT_CLINIC_OR_DEPARTMENT_OTHER): Payer: Medicare Other | Admitting: Lab

## 2012-10-11 VITALS — BP 109/68 | HR 58

## 2012-10-11 DIAGNOSIS — Z5111 Encounter for antineoplastic chemotherapy: Secondary | ICD-10-CM

## 2012-10-11 DIAGNOSIS — C569 Malignant neoplasm of unspecified ovary: Secondary | ICD-10-CM

## 2012-10-11 LAB — CBC WITH DIFFERENTIAL/PLATELET
BASO%: 0.4 % (ref 0.0–2.0)
Eosinophils Absolute: 0 10*3/uL (ref 0.0–0.5)
LYMPH%: 27.5 % (ref 14.0–49.7)
MCHC: 32.2 g/dL (ref 31.5–36.0)
MONO#: 0.1 10*3/uL (ref 0.1–0.9)
NEUT#: 1.7 10*3/uL (ref 1.5–6.5)
RBC: 3.3 10*6/uL — ABNORMAL LOW (ref 3.70–5.45)
RDW: 16.8 % — ABNORMAL HIGH (ref 11.2–14.5)
WBC: 2.4 10*3/uL — ABNORMAL LOW (ref 3.9–10.3)

## 2012-10-11 MED ORDER — DEXAMETHASONE SODIUM PHOSPHATE 20 MG/5ML IJ SOLN
20.0000 mg | Freq: Once | INTRAMUSCULAR | Status: AC
  Administered 2012-10-11: 20 mg via INTRAVENOUS

## 2012-10-11 MED ORDER — FAMOTIDINE IN NACL 20-0.9 MG/50ML-% IV SOLN
20.0000 mg | Freq: Once | INTRAVENOUS | Status: AC
  Administered 2012-10-11: 20 mg via INTRAVENOUS

## 2012-10-11 MED ORDER — SODIUM CHLORIDE 0.9 % IV SOLN
80.0000 mg/m2 | Freq: Once | INTRAVENOUS | Status: AC
  Administered 2012-10-11: 144 mg via INTRAVENOUS
  Filled 2012-10-11: qty 24

## 2012-10-11 MED ORDER — SODIUM CHLORIDE 0.9 % IV SOLN
Freq: Once | INTRAVENOUS | Status: AC
  Administered 2012-10-11: 11:00:00 via INTRAVENOUS

## 2012-10-11 MED ORDER — DIPHENHYDRAMINE HCL 50 MG/ML IJ SOLN
25.0000 mg | Freq: Once | INTRAMUSCULAR | Status: AC
  Administered 2012-10-11: 25 mg via INTRAVENOUS

## 2012-10-11 MED ORDER — ONDANSETRON 8 MG/50ML IVPB (CHCC)
8.0000 mg | Freq: Once | INTRAVENOUS | Status: AC
  Administered 2012-10-11: 8 mg via INTRAVENOUS

## 2012-10-11 NOTE — Patient Instructions (Signed)
[facility] Discharge Instructions for Patients Receiving Chemotherapy  Today you received the following chemotherapy agents: taxol  To help prevent nausea and vomiting after your treatment, we encourage you to take your nausea medication.  Take it as often as prescribed.     If you develop nausea and vomiting that is not controlled by your nausea medication, call the clinic. If it is after clinic hours your family physician or the after hours number for the clinic or go to the Emergency Department.   BELOW ARE SYMPTOMS THAT SHOULD BE REPORTED IMMEDIATELY:  *FEVER GREATER THAN 100.5 F  *CHILLS WITH OR WITHOUT FEVER  NAUSEA AND VOMITING THAT IS NOT CONTROLLED WITH YOUR NAUSEA MEDICATION  *UNUSUAL SHORTNESS OF BREATH  *UNUSUAL BRUISING OR BLEEDING  TENDERNESS IN MOUTH AND THROAT WITH OR WITHOUT PRESENCE OF ULCERS  *URINARY PROBLEMS  *BOWEL PROBLEMS  UNUSUAL RASH Items with * indicate a potential emergency and should be followed up as soon as possible.  Feel free to call the clinic you have any questions or concerns. The clinic phone number is [phone].   I have been informed and understand all the instructions given to me. I know to contact the clinic, my physician, or go to the Emergency Department if any problems should occur. I do not have any questions at this time, but understand that I may call the clinic during office hours   should I have any questions or need assistance in obtaining follow up care.    __________________________________________  _____________  __________ Signature of Patient or Authorized Representative            Date                   Time    __________________________________________ Nurse's Signature    

## 2012-10-11 NOTE — Progress Notes (Signed)
OK to treat with WBC 2.4 per Dr. Darrold Span.  TKF

## 2012-10-12 ENCOUNTER — Ambulatory Visit (HOSPITAL_BASED_OUTPATIENT_CLINIC_OR_DEPARTMENT_OTHER): Payer: Medicare Other

## 2012-10-12 VITALS — BP 99/64 | HR 59 | Temp 97.3°F

## 2012-10-12 DIAGNOSIS — Z5189 Encounter for other specified aftercare: Secondary | ICD-10-CM

## 2012-10-12 DIAGNOSIS — C569 Malignant neoplasm of unspecified ovary: Secondary | ICD-10-CM

## 2012-10-12 MED ORDER — FILGRASTIM 300 MCG/0.5ML IJ SOLN
300.0000 ug | Freq: Once | INTRAMUSCULAR | Status: AC
  Administered 2012-10-12: 300 ug via SUBCUTANEOUS

## 2012-10-12 NOTE — Patient Instructions (Addendum)
Call MD for problems 

## 2012-10-13 ENCOUNTER — Other Ambulatory Visit: Payer: Self-pay | Admitting: Oncology

## 2012-10-15 ENCOUNTER — Telehealth: Payer: Self-pay | Admitting: *Deleted

## 2012-10-15 ENCOUNTER — Encounter: Payer: Self-pay | Admitting: Oncology

## 2012-10-15 ENCOUNTER — Other Ambulatory Visit (HOSPITAL_BASED_OUTPATIENT_CLINIC_OR_DEPARTMENT_OTHER): Payer: Medicare Other | Admitting: Lab

## 2012-10-15 ENCOUNTER — Ambulatory Visit (HOSPITAL_BASED_OUTPATIENT_CLINIC_OR_DEPARTMENT_OTHER): Payer: Medicare Other | Admitting: Oncology

## 2012-10-15 ENCOUNTER — Telehealth: Payer: Self-pay | Admitting: Oncology

## 2012-10-15 ENCOUNTER — Other Ambulatory Visit: Payer: Self-pay

## 2012-10-15 VITALS — BP 120/65 | HR 75 | Temp 99.3°F | Resp 18 | Ht 67.0 in | Wt 143.0 lb

## 2012-10-15 DIAGNOSIS — C569 Malignant neoplasm of unspecified ovary: Secondary | ICD-10-CM

## 2012-10-15 DIAGNOSIS — Z86718 Personal history of other venous thrombosis and embolism: Secondary | ICD-10-CM

## 2012-10-15 LAB — CBC WITH DIFFERENTIAL/PLATELET
Basophils Absolute: 0 10*3/uL (ref 0.0–0.1)
EOS%: 0.7 % (ref 0.0–7.0)
HCT: 31.1 % — ABNORMAL LOW (ref 34.8–46.6)
HGB: 10.3 g/dL — ABNORMAL LOW (ref 11.6–15.9)
LYMPH%: 28.6 % (ref 14.0–49.7)
MCH: 30.7 pg (ref 25.1–34.0)
MCV: 92.3 fL (ref 79.5–101.0)
MONO%: 2.7 % (ref 0.0–14.0)
NEUT%: 67 % (ref 38.4–76.8)
Platelets: 164 10*3/uL (ref 145–400)
RDW: 18.1 % — ABNORMAL HIGH (ref 11.2–14.5)

## 2012-10-15 LAB — COMPREHENSIVE METABOLIC PANEL (CC13)
Alkaline Phosphatase: 91 U/L (ref 40–150)
BUN: 17.6 mg/dL (ref 7.0–26.0)
Creatinine: 0.7 mg/dL (ref 0.6–1.1)
Glucose: 114 mg/dl — ABNORMAL HIGH (ref 70–99)
Total Bilirubin: 0.38 mg/dL (ref 0.20–1.20)

## 2012-10-15 MED ORDER — RIVAROXABAN 20 MG PO TABS: 20.0000 mg | ORAL_TABLET | Freq: Every morning | ORAL | Status: DC

## 2012-10-15 NOTE — Patient Instructions (Signed)
Saline nose spray every 30 min while awake for next several days. If heavy nosebleed, use Afrin spray once and call.

## 2012-10-15 NOTE — Telephone Encounter (Signed)
Per staff phone call and POF I have schedueld appts.  JMW  

## 2012-10-15 NOTE — Progress Notes (Signed)
OFFICE PROGRESS NOTE   10/15/2012   Physicians:P.Lazaro Arms, MD ([facility])/ [place] Elpidio Anis, Verdis Prime)   INTERVAL HISTORY:   Patient is seen, alone for visit, in continuing attention to ovarian cancer, treated to this point with 3 cycles of neoadjuvant chemotherapy, interval suboptimal debulking on 09-10-12 and now back on dose dense carbo/taxol. She continues xarelto which was begun on presentation due to extensive IVC and pelvic DVT; she also has IVC filter in which we have decided to leave for now. She is tolerating chemotherapy well now, day 15 cycle 4 given 10-11-12 and due day 1 cycle 5 on 10-18-12. She does not have PAC.  Oncologic History  Patient developed constipation with some right abdominal pain in May 2013. She saw GI physician in ~ Aug 2013, but was unable to tolerate prep for colonoscopy such that this was never accomplished. Constipation improved with probiotics and Activia. She had abdominal fullness more recently, with early satiety and some reflux symptoms. She developed LLE superficial phlebitis in Jan 2014 (dopplers by PCP negative for DVT then), followed by swelling and discomfort in left medial thigh with venous doppler at [facility] in [facility] on 05-21-12 with acute thrombus left common femoral vein. 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 She was begun on lovenox and transitioned to Xarelto by Dr Everlene Other, presently on xarelto 20 mg daily. CA 125 was 569 initially. She saw Dr Duard Brady on 06-12-12, exam remarkable for distended abdomen with fluid wave and 15 cm fixed mass in pelvis. 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 Neoadjuvant dose dense carbo/taxol was beun 06-21-12 with 3 cycles given thru 08-16-12. She needed neupogen days 2,8 and 16 on this regimen. She had good partial response by CTs 08-19-12 and had improvement in CA 125 from 1041 as baseline for the chemotherapy in late Feb 2014 down to 92 on 08-21-12 after 3 cycles. She went to exploratory laparotomy with extensive lysis of adhesions, left ureteral lysis, and bilateral salpingo-oophorectomy by Dr Duard Brady at [facility] on 09-10-12, which was suboptimal debulking. Operative findings: Diffuse carcinomatosis of the residual omentum involving the entire transverse colon. Transverse colon densely adherent to the anterior abdominal wall. No disease throughout the small bowel. Mesenteric adenopathy with lymph nodes measuring 1 cm, involving the root of the small bowel mesentery. It centimeter right ovarian mass and a 6 cm left ovarian mass. Small bowel densely adherent to the pelvis. 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   Review of Systems [name] was very helpful for one additional episode of abdominal cramping since  I saw her last. She is aware of reflux occasionally despite regular protonix, better with prn carafate additionally. She had some constipation again with last chemo, resolved with increase in laxatives. No peripheral neuropathy, tho occasional numbness of left 3 & 4 fingers which probably preceded chemo. Energy and appetite are great and activity level is good. She has had occasional epistaxis when she clears crusty clots from nose; I have recommended saline nasal spray every 30 min while awake for several days to try to clear this out. No fever or symptoms of infection. No abdominal swelling. Varicose veins very prominent but not painful, discussed support stockings in preference to any invasive treatment now due to rest of medical situation Remainder of 10 point Review of Systems negative.  Objective:  Vital signs in last 24 hours:  BP 120/65  Pulse 75  Temp(Src) 99.3 F (37.4 C) (Oral)  Resp 18  Ht 5\' 7"  (1.702 m)  Wt 143 lb (64.864 kg)  BMI 22.39 kg/m2  SpO2 100%  Easily ambulatory, looks comfortable, wearing wig.  HEENT:PERRLA, sclera clear, anicteric and oropharynx clear, no lesions LymphaticsCervical, supraclavicular, and axillary nodes normal.No inguinal adenopathy Resp: clear to auscultation bilaterally and normal percussion bilaterally Cardio: regular rate and rhythm GI: soft, not distended, midline incision closed, no tenderness or erythema, some BS, no HSM or mass Extremities: extremities normal, atraumatic, no cyanosis or edema Prominent large and small varicose veins bilat LE, no cords or tenderness. Neuro:no sensory deficits noted Breast:normal without suspicious masses, skin or nipple changes or axillary nodes Skin without rash or ecchymosis  Lab Results:  Results for orders placed in visit on 10/15/12  CBC WITH DIFFERENTIAL      Result Value Range   WBC 2.9 (*) 3.9 - 10.3 10e3/uL   NEUT# 1.9  1.5 - 6.5 10e3/uL   HGB 10.3 (*) 11.6 - 15.9 g/dL   HCT 32.9 (*) 51.8 -  46.6 %   Platelets 164  145 - 400 10e3/uL   MCV 92.3  79.5 - 101.0 fL   MCH 30.7  25.1 - 34.0 pg   MCHC 33.3  31.5 - 36.0 g/dL   RBC 8.41 (*) 6.60 - 6.30 10e6/uL   RDW 18.1 (*) 11.2 - 14.5 %   lymph# 0.8 (*) 0.9 - 3.3 10e3/uL   MONO# 0.1  0.1 - 0.9 10e3/uL   Eosinophils Absolute 0.0  0.0 - 0.5 10e3/uL   Basophils Absolute 0.0  0.0 - 0.1 10e3/uL   NEUT% 67.0  38.4 - 76.8 %   LYMPH% 28.6  14.0 - 49.7 %   MONO% 2.7  0.0 - 14.0 %   EOS% 0.7  0.0 - 7.0 %   BASO% 1.0  0.0 - 2.0 %  COMPREHENSIVE METABOLIC PANEL (CC13)      Result Value Range   Sodium 139  136 - 145 mEq/L   Potassium 4.1  3.5 - 5.1 mEq/L   Chloride 104  98 - 107 mEq/L   CO2 27  22 - 29 mEq/L   Glucose 114 (*) 70 - 99 mg/dl   BUN 16.0  7.0 - 10.9 mg/dL   Creatinine 0.7  0.6 - 1.1 mg/dL   Total Bilirubin 3.23  0.20 - 1.20 mg/dL   Alkaline Phosphatase 91  40 -  150 U/L   AST 16  5 - 34 U/L   ALT 20  0 - 55 U/L   Total Protein 6.8  6.4 - 8.3 g/dL   Albumin 3.3 (*) 3.5 - 5.0 g/dL   Calcium 9.5  8.4 - 16.1 mg/dL    CA 096 available after visit 130, this having been 92 in April and likely still reflects some increase related to surgery. Will follow. Studies/Results:  No results found.  Medications: I have reviewed the patient's current medications.  Assessment/Plan:  1. 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 Expect to do repeat scans (CT  AP) after 3 additional cycles and to see Dr Duard Brady then. Some elevation in CA 125 today, which may be related to surgery itself or break in chemo around surgery, will follow. Might consider addition of avastin if present regimen needs adjustment.  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  Per IR MD, filters can be removed at any  time, but probably optimal to do this within 6 months if desired, however with extent of her clot in IVC and pelvic veins, and fact that she will likely not achieve long term CR, it may be best not to remove at all. Patient is in agreement with leaving the IVC filter in for now. 3.intermittent epigastric pain: improved with prn carafate in addition to regular H2 blocker. Note single dose [name] resolved one further episode of abdominal cramping since last visit. 4.long tobacco recently discontinued.  CT chest 08-19-12 5.overdue mammograms: no obvious significant concerns on PE  6.ruptured appendix remotely  7.basal cell carcinomas  8. Pneumovax given  9.Advance Directives and HCPOA done, but full support to that point 10. 11 mm nodule upper outer left breast: last mammograms in EMR and per my consult note of 05-2012 were in 2008. Will try to get these done, as she should be able to tolerate them now. (this information noted after visit and we will be in touch with patient by phone).   She will begin cycle 5 on 10-18-12 with neupogen on 6-28. Day 8 cycle 5 will be on 10-24-12 due to 7-4 holiday, with neupogen on ~ day 10 due to holiday, then day 15 cycle 5 on 11-01-12 with neupogen 7-12. She should be seen by provider 7-16 or 7-17 with cbc cmet ca125 prior to day 1 cycle 6 on 11-08-12. Day 1 on this regimen is carbo + short taxol and days 8 and 15 are each short taxol. Regimen is q 21 days with no week off. Neupogen day after each treatment has supported counts adequately to avoid delays or dose reductions.        Monique [name] P, MD   10/15/2012, 11:54 AM

## 2012-10-16 ENCOUNTER — Other Ambulatory Visit: Payer: Self-pay | Admitting: Oncology

## 2012-10-16 ENCOUNTER — Telehealth: Payer: Self-pay | Admitting: Oncology

## 2012-10-16 ENCOUNTER — Telehealth: Payer: Self-pay

## 2012-10-16 DIAGNOSIS — Z1231 Encounter for screening mammogram for malignant neoplasm of breast: Secondary | ICD-10-CM

## 2012-10-16 DIAGNOSIS — N63 Unspecified lump in unspecified breast: Secondary | ICD-10-CM

## 2012-10-16 DIAGNOSIS — R928 Other abnormal and inconclusive findings on diagnostic imaging of breast: Secondary | ICD-10-CM

## 2012-10-16 NOTE — Telephone Encounter (Signed)
Discussed lab work and mammogram as noted below by Dr. Darrold Span with Ms. Ala Dach. Scheduled a diagnostic  Mammogram/ [place] for October 29, 2012 at 1030 at the [facility].  Left appointment information in vm of patient's home number after initial conversation regarding below.

## 2012-10-16 NOTE — Telephone Encounter (Signed)
Message copied by Lorine Bears on Wed Oct 16, 2012  9:59 AM ------      Message from: Reece Packer      Created: Wed Oct 16, 2012  8:32 AM       Please let her know that CA 125 was 130 yesterday, which was higher than last value of 90 in April, but likely reflects post surgical bump in the marker (as we had discussed earlier) and may also be a bit higher with the necessary chemo break around surgery. I expect this will drop again as we continue treatment.            Please also tell her that I would like her to have mammograms in next few weeks. The CT chest questioned a small nodule in left breast which it would be good to check with mammogram, and I think she is feeling ok to have these done now. Recommend 3D/ tomo mammogram which I will put on order and which she should [facility])            Cc LA, TH ------

## 2012-10-16 NOTE — Telephone Encounter (Signed)
confirmed with the pt that she is aware of Mammo....pt aware

## 2012-10-18 ENCOUNTER — Ambulatory Visit (HOSPITAL_BASED_OUTPATIENT_CLINIC_OR_DEPARTMENT_OTHER): Payer: Medicare Other

## 2012-10-18 ENCOUNTER — Other Ambulatory Visit: Payer: Self-pay | Admitting: Oncology

## 2012-10-18 ENCOUNTER — Other Ambulatory Visit (HOSPITAL_BASED_OUTPATIENT_CLINIC_OR_DEPARTMENT_OTHER): Payer: Medicare Other | Admitting: Lab

## 2012-10-18 ENCOUNTER — Telehealth: Payer: Self-pay | Admitting: Oncology

## 2012-10-18 VITALS — BP 122/58 | HR 73 | Temp 97.8°F | Resp 18

## 2012-10-18 DIAGNOSIS — C569 Malignant neoplasm of unspecified ovary: Secondary | ICD-10-CM

## 2012-10-18 DIAGNOSIS — Z5111 Encounter for antineoplastic chemotherapy: Secondary | ICD-10-CM

## 2012-10-18 LAB — CBC WITH DIFFERENTIAL/PLATELET
BASO%: 0 % (ref 0.0–2.0)
Basophils Absolute: 0 10*3/uL (ref 0.0–0.1)
EOS%: 0 % (ref 0.0–7.0)
HGB: 10.1 g/dL — ABNORMAL LOW (ref 11.6–15.9)
MCH: 29.4 pg (ref 25.1–34.0)
MCHC: 31.9 g/dL (ref 31.5–36.0)
MCV: 92.4 fL (ref 79.5–101.0)
MONO%: 3.5 % (ref 0.0–14.0)
Platelets: 153 10*3/uL (ref 145–400)
RBC: 3.43 10*6/uL — ABNORMAL LOW (ref 3.70–5.45)
RDW: 17 % — ABNORMAL HIGH (ref 11.2–14.5)
lymph#: 0.6 10*3/uL — ABNORMAL LOW (ref 0.9–3.3)
nRBC: 0 % (ref 0–0)

## 2012-10-18 MED ORDER — FAMOTIDINE IN NACL 20-0.9 MG/50ML-% IV SOLN
20.0000 mg | Freq: Once | INTRAVENOUS | Status: AC
  Administered 2012-10-18: 20 mg via INTRAVENOUS

## 2012-10-18 MED ORDER — DEXAMETHASONE SODIUM PHOSPHATE 20 MG/5ML IJ SOLN
20.0000 mg | Freq: Once | INTRAMUSCULAR | Status: AC
  Administered 2012-10-18: 20 mg via INTRAVENOUS

## 2012-10-18 MED ORDER — SODIUM CHLORIDE 0.9 % IV SOLN
Freq: Once | INTRAVENOUS | Status: AC
  Administered 2012-10-18: 11:00:00 via INTRAVENOUS

## 2012-10-18 MED ORDER — SODIUM CHLORIDE 0.9 % IV SOLN
550.0000 mg | Freq: Once | INTRAVENOUS | Status: AC
  Administered 2012-10-18: 550 mg via INTRAVENOUS
  Filled 2012-10-18: qty 55

## 2012-10-18 MED ORDER — ONDANSETRON 16 MG/50ML IVPB (CHCC)
16.0000 mg | Freq: Once | INTRAVENOUS | Status: AC
  Administered 2012-10-18: 16 mg via INTRAVENOUS

## 2012-10-18 MED ORDER — SODIUM CHLORIDE 0.9 % IV SOLN
80.0000 mg/m2 | Freq: Once | INTRAVENOUS | Status: AC
  Administered 2012-10-18: 144 mg via INTRAVENOUS
  Filled 2012-10-18: qty 24

## 2012-10-18 MED ORDER — DIPHENHYDRAMINE HCL 50 MG/ML IJ SOLN
25.0000 mg | Freq: Once | INTRAMUSCULAR | Status: AC
  Administered 2012-10-18: 25 mg via INTRAVENOUS

## 2012-10-18 NOTE — Progress Notes (Signed)
Medical Oncology  ANC 1.4 today, day 1 cycle 5, other counts good. Will treat today, give neupogen 6-28 as planned and also add neupogen 6-30. Orders entered.  Ila Mcgill, MD

## 2012-10-18 NOTE — Telephone Encounter (Signed)
gv and printed appt sched and avs for pt  °

## 2012-10-18 NOTE — Patient Instructions (Addendum)
[facility] Discharge Instructions for Patients Receiving Chemotherapy  Today you received the following chemotherapy agents Taxol and Carboplatin.  To help prevent nausea and vomiting after your treatment, we encourage you to take your nausea medication.   If you develop nausea and vomiting that is not controlled by your nausea medication, call the clinic.   BELOW ARE SYMPTOMS THAT SHOULD BE REPORTED IMMEDIATELY:  *FEVER GREATER THAN 100.5 F  *CHILLS WITH OR WITHOUT FEVER  NAUSEA AND VOMITING THAT IS NOT CONTROLLED WITH YOUR NAUSEA MEDICATION  *UNUSUAL SHORTNESS OF BREATH  *UNUSUAL BRUISING OR BLEEDING  TENDERNESS IN MOUTH AND THROAT WITH OR WITHOUT PRESENCE OF ULCERS  *URINARY PROBLEMS  *BOWEL PROBLEMS  UNUSUAL RASH Items with * indicate a potential emergency and should be followed up as soon as possible.  Feel free to call the clinic you have any questions or concerns. The clinic phone number is [phone].    

## 2012-10-19 ENCOUNTER — Ambulatory Visit (HOSPITAL_BASED_OUTPATIENT_CLINIC_OR_DEPARTMENT_OTHER): Payer: Medicare Other

## 2012-10-19 VITALS — BP 121/69 | HR 66 | Temp 98.5°F

## 2012-10-19 DIAGNOSIS — Z5189 Encounter for other specified aftercare: Secondary | ICD-10-CM

## 2012-10-19 DIAGNOSIS — C569 Malignant neoplasm of unspecified ovary: Secondary | ICD-10-CM

## 2012-10-19 MED ORDER — FILGRASTIM 300 MCG/0.5ML IJ SOLN
300.0000 ug | Freq: Once | INTRAMUSCULAR | Status: AC
  Administered 2012-10-19: 300 ug via SUBCUTANEOUS

## 2012-10-21 ENCOUNTER — Ambulatory Visit (HOSPITAL_BASED_OUTPATIENT_CLINIC_OR_DEPARTMENT_OTHER): Payer: Medicare Other

## 2012-10-21 ENCOUNTER — Telehealth: Payer: Self-pay | Admitting: *Deleted

## 2012-10-21 VITALS — BP 121/63 | HR 74 | Temp 98.8°F

## 2012-10-21 DIAGNOSIS — C569 Malignant neoplasm of unspecified ovary: Secondary | ICD-10-CM

## 2012-10-21 DIAGNOSIS — Z5189 Encounter for other specified aftercare: Secondary | ICD-10-CM

## 2012-10-21 MED ORDER — FILGRASTIM 300 MCG/0.5ML IJ SOLN
300.0000 ug | Freq: Once | INTRAMUSCULAR | Status: AC
Start: 2012-10-21 — End: 2012-10-21
  Administered 2012-10-21: 300 ug via SUBCUTANEOUS
  Filled 2012-10-21: qty 0.5

## 2012-10-21 NOTE — Telephone Encounter (Signed)
Per staff message I have moved up appt on 7/3. JMW

## 2012-10-22 ENCOUNTER — Telehealth: Payer: Self-pay | Admitting: Oncology

## 2012-10-24 ENCOUNTER — Telehealth: Payer: Self-pay | Admitting: Oncology

## 2012-10-24 ENCOUNTER — Encounter: Payer: Self-pay | Admitting: Oncology

## 2012-10-24 ENCOUNTER — Ambulatory Visit (HOSPITAL_BASED_OUTPATIENT_CLINIC_OR_DEPARTMENT_OTHER): Payer: Medicare Other

## 2012-10-24 ENCOUNTER — Other Ambulatory Visit (HOSPITAL_BASED_OUTPATIENT_CLINIC_OR_DEPARTMENT_OTHER): Payer: Medicare Other | Admitting: Lab

## 2012-10-24 ENCOUNTER — Other Ambulatory Visit: Payer: Self-pay | Admitting: Oncology

## 2012-10-24 VITALS — BP 117/67 | HR 65 | Temp 97.1°F

## 2012-10-24 DIAGNOSIS — Z5189 Encounter for other specified aftercare: Secondary | ICD-10-CM

## 2012-10-24 DIAGNOSIS — C569 Malignant neoplasm of unspecified ovary: Secondary | ICD-10-CM

## 2012-10-24 LAB — CBC WITH DIFFERENTIAL/PLATELET
BASO%: 1.1 % (ref 0.0–2.0)
EOS%: 0.1 % (ref 0.0–7.0)
Eosinophils Absolute: 0 10*3/uL (ref 0.0–0.5)
LYMPH%: 30.1 % (ref 14.0–49.7)
MCHC: 33 g/dL (ref 31.5–36.0)
MCV: 93 fL (ref 79.5–101.0)
MONO%: 2.6 % (ref 0.0–14.0)
NEUT#: 1 10*3/uL — ABNORMAL LOW (ref 1.5–6.5)
Platelets: 104 10*3/uL — ABNORMAL LOW (ref 145–400)
RBC: 3.31 10*6/uL — ABNORMAL LOW (ref 3.70–5.45)
RDW: 17.2 % — ABNORMAL HIGH (ref 11.2–14.5)

## 2012-10-24 MED ORDER — FILGRASTIM 300 MCG/0.5ML IJ SOLN
300.0000 ug | Freq: Once | INTRAMUSCULAR | Status: AC
  Administered 2012-10-24: 300 ug via SUBCUTANEOUS
  Filled 2012-10-24: qty 0.5

## 2012-10-24 NOTE — Telephone Encounter (Signed)
Gave pt appt for lab , chemo and MD for month of July 2014

## 2012-10-24 NOTE — Progress Notes (Signed)
Medical Oncology  ANC 1.0 and plt 104K today, due day 8 cycle 5 today, this held and neupogen 300 given today (instead of July 5). Likely will be able to have day 15 cycle 5 as planned next week, CBC to be repeated prior to treatment that day.  Ila Mcgill, MD

## 2012-10-26 ENCOUNTER — Ambulatory Visit: Payer: Medicare Other

## 2012-10-28 ENCOUNTER — Ambulatory Visit: Payer: Medicare Other

## 2012-10-29 ENCOUNTER — Other Ambulatory Visit: Payer: Self-pay | Admitting: Oncology

## 2012-10-29 ENCOUNTER — Ambulatory Visit
Admission: RE | Admit: 2012-10-29 | Discharge: 2012-10-29 | Disposition: A | Discharge status: Home / Self Care | Payer: Medicare Other | Source: Ambulatory Visit | Attending: Oncology | Admitting: Oncology

## 2012-10-29 DIAGNOSIS — N63 Unspecified lump in unspecified breast: Secondary | ICD-10-CM

## 2012-11-01 ENCOUNTER — Ambulatory Visit (HOSPITAL_BASED_OUTPATIENT_CLINIC_OR_DEPARTMENT_OTHER): Payer: Medicare Other

## 2012-11-01 ENCOUNTER — Other Ambulatory Visit (HOSPITAL_BASED_OUTPATIENT_CLINIC_OR_DEPARTMENT_OTHER): Payer: Medicare Other | Admitting: Lab

## 2012-11-01 ENCOUNTER — Other Ambulatory Visit: Payer: Self-pay | Admitting: Oncology

## 2012-11-01 VITALS — BP 100/58 | HR 61 | Temp 98.1°F | Resp 20

## 2012-11-01 DIAGNOSIS — Z5111 Encounter for antineoplastic chemotherapy: Secondary | ICD-10-CM

## 2012-11-01 DIAGNOSIS — C569 Malignant neoplasm of unspecified ovary: Secondary | ICD-10-CM

## 2012-11-01 LAB — CBC WITH DIFFERENTIAL/PLATELET
BASO%: 0.4 % (ref 0.0–2.0)
EOS%: 0 % (ref 0.0–7.0)
HGB: 9.8 g/dL — ABNORMAL LOW (ref 11.6–15.9)
MCH: 29.7 pg (ref 25.1–34.0)
MCHC: 31.6 g/dL (ref 31.5–36.0)
MONO#: 0.1 10*3/uL (ref 0.1–0.9)
RDW: 17.2 % — ABNORMAL HIGH (ref 11.2–14.5)
WBC: 2.3 10*3/uL — ABNORMAL LOW (ref 3.9–10.3)
lymph#: 0.6 10*3/uL — ABNORMAL LOW (ref 0.9–3.3)

## 2012-11-01 MED ORDER — FAMOTIDINE IN NACL 20-0.9 MG/50ML-% IV SOLN: 20.0000 mg | Freq: Once | INTRAVENOUS | Status: DC

## 2012-11-01 MED ORDER — SODIUM CHLORIDE 0.9 % IV SOLN
80.0000 mg/m2 | Freq: Once | INTRAVENOUS | Status: AC
  Administered 2012-11-01: 144 mg via INTRAVENOUS
  Filled 2012-11-01: qty 24

## 2012-11-01 MED ORDER — DEXAMETHASONE SODIUM PHOSPHATE 20 MG/5ML IJ SOLN
20.0000 mg | Freq: Once | INTRAMUSCULAR | Status: AC
  Administered 2012-11-01: 20 mg via INTRAVENOUS

## 2012-11-01 MED ORDER — SODIUM CHLORIDE 0.9 % IV SOLN
Freq: Once | INTRAVENOUS | Status: AC
  Administered 2012-11-01: 11:00:00 via INTRAVENOUS

## 2012-11-01 MED ORDER — ONDANSETRON 8 MG/50ML IVPB (CHCC)
8.0000 mg | Freq: Once | INTRAVENOUS | Status: AC
  Administered 2012-11-01: 8 mg via INTRAVENOUS

## 2012-11-01 MED ORDER — DIPHENHYDRAMINE HCL 50 MG/ML IJ SOLN
25.0000 mg | Freq: Once | INTRAMUSCULAR | Status: AC
  Administered 2012-11-01: 25 mg via INTRAVENOUS

## 2012-11-01 MED ORDER — RANITIDINE HCL 50 MG/2ML IJ SOLN
50.0000 mg | Freq: Once | INTRAVENOUS | Status: AC
  Administered 2012-11-01: 50 mg via INTRAVENOUS
  Filled 2012-11-01: qty 2

## 2012-11-01 MED ORDER — PACLITAXEL CHEMO INJECTION 300 MG/50ML: 80.0000 mg/m2 | Freq: Once | INTRAVENOUS | Status: DC

## 2012-11-01 NOTE — Patient Instructions (Signed)
[facility] Discharge Instructions for Patients Receiving Chemotherapy  Today you received the following chemotherapy agents Taxol  To help prevent nausea and vomiting after your treatment, we encourage you to take your nausea medication as needed.   If you develop nausea and vomiting that is not controlled by your nausea medication, call the clinic.   BELOW ARE SYMPTOMS THAT SHOULD BE REPORTED IMMEDIATELY:  *FEVER GREATER THAN 100.5 F  *CHILLS WITH OR WITHOUT FEVER  NAUSEA AND VOMITING THAT IS NOT CONTROLLED WITH YOUR NAUSEA MEDICATION  *UNUSUAL SHORTNESS OF BREATH  *UNUSUAL BRUISING OR BLEEDING  TENDERNESS IN MOUTH AND THROAT WITH OR WITHOUT PRESENCE OF ULCERS  *URINARY PROBLEMS  *BOWEL PROBLEMS  UNUSUAL RASH Items with * indicate a potential emergency and should be followed up as soon as possible.  Feel free to call the clinic you have any questions or concerns. The clinic phone number is [phone].    

## 2012-11-02 ENCOUNTER — Ambulatory Visit (HOSPITAL_BASED_OUTPATIENT_CLINIC_OR_DEPARTMENT_OTHER): Payer: Medicare Other

## 2012-11-02 VITALS — BP 111/59 | HR 54 | Temp 97.6°F

## 2012-11-02 DIAGNOSIS — C569 Malignant neoplasm of unspecified ovary: Secondary | ICD-10-CM

## 2012-11-02 DIAGNOSIS — Z5189 Encounter for other specified aftercare: Secondary | ICD-10-CM

## 2012-11-02 MED ORDER — FILGRASTIM 300 MCG/0.5ML IJ SOLN
300.0000 ug | Freq: Once | INTRAMUSCULAR | Status: AC
  Administered 2012-11-02: 300 ug via SUBCUTANEOUS

## 2012-11-02 NOTE — Patient Instructions (Addendum)
Filgrastim, G-CSF injection What is this medicine? FILGRASTIM, G-CSF (fil GRA stim) stimulates the formation of white blood cells. This medicine is given to patients with conditions that may cause a decrease in white blood cells, like those receiving certain types of chemotherapy or bone marrow transplant. It helps the bone marrow recover its ability to produce white blood cells. Increasing the amount of white blood cells helps to decrease the risk of infection and fever. This medicine may be used for other purposes; ask your health care provider or pharmacist if you have questions. What should I tell my health care provider before I take this medicine? They need to know if you have any of these conditions: -currently receiving radiation therapy -sickle cell disease -an unusual or allergic reaction to filgrastim, E. coli protein, other medicines, foods, dyes, or preservatives -pregnant or trying to get pregnant -breast-feeding How should I use this medicine? This medicine is for injection into a vein or injection under the skin. It is usually given by a health care professional in a hospital or clinic setting. If you get this medicine at home, you will be taught how to prepare and give this medicine. Always change the site for the injection under the skin. Let the solution warm to room temperature before you use it. Do not shake the solution before you withdraw a dose. Throw away any unused portion. Use exactly as directed. Take your medicine at regular intervals. Do not take your medicine more often than directed. It is important that you put your used needles and syringes in a special sharps container. Do not put them in a trash can. If you do not have a sharps container, call your pharmacist or healthcare provider to get one. Talk to your pediatrician regarding the use of this medicine in children. While this medicine may be prescribed for children for selected conditions, precautions do  apply. Overdosage: If you think you have taken too much of this medicine contact a poison control center or emergency room at once. NOTE: This medicine is only for you. Do not share this medicine with others. What if I miss a dose? Try not to miss doses. If you miss a dose take the dose as soon as you remember. If it is almost time for the next dose, do not take double doses unless told to by your doctor or health care professional. What may interact with this medicine? -lithium -medicines for cancer chemotherapy This list may not describe all possible interactions. Give your health care provider a list of all the medicines, herbs, non-prescription drugs, or dietary supplements you use. Also tell them if you smoke, drink alcohol, or use illegal drugs. Some items may interact with your medicine. What should I watch for while using this medicine? Visit your doctor or health care professional for regular checks on your progress. If you get a fever or any sign of infection while you are using this medicine, do not treat yourself. Check with your doctor or health care professional. Bone pain can usually be relieved by mild pain relievers such as acetaminophen or ibuprofen. Check with your doctor or health care professional before taking these medicines as they may hide a fever. Call your doctor or health care professional if the aches and pains are severe or do not go away. What side effects may I notice from receiving this medicine? Side effects that you should report to your doctor or health care professional as soon as possible: -allergic reactions like skin rash, itching   or hives, swelling of the face, lips, or tongue -difficulty breathing, wheezing -fever -pain, redness, or swelling at the injection site -stomach or side pain, or pain at the shoulder Side effects that usually do not require medical attention (report to your doctor or health care professional if they continue or are  bothersome): -bone pain (ribs, lower back, breast bone) -headache -skin rash This list may not describe all possible side effects. Call your doctor for medical advice about side effects. You may report side effects to FDA at 1-800-FDA-1088. Where should I keep my medicine? Keep out of the reach of children. Store in a refrigerator between 2 and 8 degrees C (36 and 46 degrees F). Do not freeze or leave in direct sunlight. If vials or syringes are left out of the refrigerator for more than 24 hours, they must be thrown away. Throw away unused vials after the expiration date on the carton. NOTE: This sheet is a summary. It may not cover all possible information. If you have questions about this medicine, talk to your doctor, pharmacist, or health care provider.  2013, [employer]. (06/26/2007 1:33:21 PM)  

## 2012-11-07 ENCOUNTER — Ambulatory Visit (HOSPITAL_BASED_OUTPATIENT_CLINIC_OR_DEPARTMENT_OTHER): Payer: Medicare Other | Admitting: Hematology and Oncology

## 2012-11-07 ENCOUNTER — Other Ambulatory Visit (HOSPITAL_BASED_OUTPATIENT_CLINIC_OR_DEPARTMENT_OTHER): Payer: Medicare Other | Admitting: Lab

## 2012-11-07 VITALS — BP 106/58 | HR 70 | Temp 97.6°F | Resp 18 | Ht 67.0 in | Wt 148.4 lb

## 2012-11-07 DIAGNOSIS — C569 Malignant neoplasm of unspecified ovary: Secondary | ICD-10-CM

## 2012-11-07 DIAGNOSIS — N63 Unspecified lump in unspecified breast: Secondary | ICD-10-CM

## 2012-11-07 DIAGNOSIS — I82890 Acute embolism and thrombosis of other specified veins: Secondary | ICD-10-CM

## 2012-11-07 LAB — CBC WITH DIFFERENTIAL/PLATELET
BASO%: 0.9 % (ref 0.0–2.0)
Basophils Absolute: 0 10*3/uL (ref 0.0–0.1)
EOS%: 0.3 % (ref 0.0–7.0)
HCT: 31.3 % — ABNORMAL LOW (ref 34.8–46.6)
HGB: 10.5 g/dL — ABNORMAL LOW (ref 11.6–15.9)
LYMPH%: 23.9 % (ref 14.0–49.7)
MCH: 31.8 pg (ref 25.1–34.0)
MCHC: 33.7 g/dL (ref 31.5–36.0)
MONO#: 0.3 10*3/uL (ref 0.1–0.9)
NEUT%: 65.8 % (ref 38.4–76.8)
Platelets: 209 10*3/uL (ref 145–400)

## 2012-11-07 LAB — COMPREHENSIVE METABOLIC PANEL (CC13)
ALT: 23 U/L (ref 0–55)
Albumin: 3.3 g/dL — ABNORMAL LOW (ref 3.5–5.0)
CO2: 26 mEq/L (ref 22–29)
Glucose: 89 mg/dl (ref 70–140)
Potassium: 4.2 mEq/L (ref 3.5–5.1)
Sodium: 141 mEq/L (ref 136–145)
Total Bilirubin: 0.23 mg/dL (ref 0.20–1.20)
Total Protein: 6.5 g/dL (ref 6.4–8.3)

## 2012-11-07 NOTE — Progress Notes (Signed)
OFFICE PROGRESS NOTE   11/07/2012   Physicians:P.Lazaro Arms, MD (PCP [facility] [place] Elpidio Anis, Verdis Prime)   INTERVAL HISTORY:   Patient is seen, alone for visit, in continuing attention to ovarian cancer, treated to this point with 3 cycles of neoadjuvant chemotherapy, interval suboptimal debulking on 09-10-12 and now back on dose dense carbo/taxol. She continues xarelto which was begun on presentation due to extensive IVC and pelvic DVT; she also has IVC filter in which we have decided to leave for now. She is tolerating chemotherapy well now, day 15 cycle 4 given 10-11-12 and day 1 cycle 5 on 10-18-12. She does not have PAC.  Oncologic History  Patient developed constipation with some right abdominal pain in May 2013. She saw GI physician in ~ Aug 2013, but was unable to tolerate prep for colonoscopy such that this was never accomplished. Constipation improved with probiotics and Activia. She had abdominal fullness more recently, with early satiety and some reflux symptoms. She developed LLE superficial phlebitis in Jan 2014 (dopplers by PCP negative for DVT then), followed by swelling and discomfort in left medial thigh with venous doppler at [facility] in [facility] on 05-21-12 with acute thrombus left common femoral vein. 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 She was begun on lovenox and transitioned to Xarelto by Dr Everlene Other, presently on xarelto 20 mg daily. CA 125 was 569 initially. She saw Dr Duard Brady on 06-12-12, exam remarkable for distended abdomen with fluid wave and 15 cm fixed mass in pelvis. 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 Neoadjuvant dose dense carbo/taxol was beun 06-21-12 with 3 cycles given thru 08-16-12. She needed neupogen days 2,8 and 16 on this regimen. She had good partial response by CTs 08-19-12 and had improvement in CA 125 from 1041 as baseline for the chemotherapy in late Feb 2014 down to 92 on 08-21-12 after 3 cycles. She went to exploratory laparotomy with extensive lysis of adhesions, left ureteral lysis, and bilateral salpingo-oophorectomy by Dr Duard Brady at [facility] on 09-10-12, which was suboptimal debulking. Operative findings: Diffuse carcinomatosis of the residual omentum involving the entire transverse colon. Transverse colon densely adherent to the anterior abdominal wall. No disease throughout the small bowel. Mesenteric adenopathy with lymph nodes measuring 1 cm, involving the root of the small bowel mesentery. It centimeter right ovarian mass and a 6 cm left ovarian mass. Small bowel densely adherent to the pelvis. 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   Review of Systems She is doing well, denied any numbness. Get tired after D1 but recover within few  days, no N or V, no F or chills. No peripheral neuropathy. Energy and appetite are great and activity level is good.  Varicose veins very prominent but not painful, discussed support stockings in preference to any invasive treatment now due to rest of medical situation Remainder of 10 point Review of Systems negative.  Objective:  Vital signs in last 24 hours:  BP 106/58  Pulse 70  Temp(Src) 97.6 F (36.4 C) (Oral)  Resp 18  Ht 5\' 7"  (1.702 m)  Wt 148 lb 6.4 oz (67.314 kg)  BMI 23.24 kg/m2  Easily ambulatory, looks comfortable, wearing wig.  HEENT:PERRLA, sclera clear, anicteric and oropharynx clear, no lesions LymphaticsCervical, supraclavicular, and axillary nodes normal.No inguinal adenopathy Resp: clear to auscultation bilaterally and normal percussion bilaterally Cardio: regular rate and rhythm GI: soft, not distended, midline incision closed, no tenderness or erythema, some BS, no HSM or mass Extremities: extremities normal, atraumatic, no cyanosis or edema Prominent large and small varicose veins bilat LE, no cords or tenderness. Neuro:no sensory deficits noted Breast:normal without suspicious masses, skin or nipple changes or axillary nodes Skin without rash or ecchymosis  Lab Results:  Results for orders placed in visit on 11/07/12  CBC WITH DIFFERENTIAL      Result Value Range   WBC 3.7 (*) 3.9 - 10.3 10e3/uL   NEUT# 2.4  1.5 - 6.5 10e3/uL   HGB 10.5 (*) 11.6 - 15.9 g/dL   HCT 40.9 (*) 81.1 - 91.4 %   Platelets 209  145 - 400 10e3/uL   MCV 94.4  79.5 - 101.0 fL   MCH 31.8  25.1 - 34.0 pg   MCHC 33.7  31.5 - 36.0 g/dL   RBC 7.82 (*) 9.56 - 2.13 10e6/uL   RDW 18.2 (*) 11.2 - 14.5 %   lymph# 0.9  0.9 - 3.3 10e3/uL   MONO# 0.3  0.1 - 0.9 10e3/uL   Eosinophils Absolute 0.0  0.0 - 0.5 10e3/uL   Basophils Absolute 0.0  0.0 - 0.1 10e3/uL   NEUT% 65.8  38.4 - 76.8 %   LYMPH% 23.9  14.0 - 49.7 %   MONO% 9.1  0.0 - 14.0 %   EOS% 0.3  0.0 - 7.0 %   BASO% 0.9  0.0 -  2.0 %  COMPREHENSIVE METABOLIC PANEL (CC13)      Result Value Range   Sodium 141  136 - 145 mEq/L   Potassium 4.2  3.5 - 5.1 mEq/L   Chloride 107  98 - 109 mEq/L   CO2 26  22 - 29 mEq/L   Glucose 89  70 - 140 mg/dl   BUN 08.6  7.0 - 57.8 mg/dL   Creatinine 0.7  0.6 - 1.1 mg/dL   Total Bilirubin 4.69  0.20 - 1.20 mg/dL   Alkaline Phosphatase 102  40 - 150 U/L   AST 19  5 - 34 U/L   ALT 23  0 - 55 U/L   Total Protein 6.5  6.4 - 8.3 g/dL   Albumin 3.3 (*) 3.5 - 5.0 g/dL   Calcium 9.3  8.4 - 62.9 mg/dL    CA 528 available after visit 130, this having been 92 in April and likely still reflects some increase related to surgery. Will  follow. Studies/Results:  No results found.  Medications: I have reviewed the patient's current medications.  Assessment/Plan:  1. 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 Expect to do repeat scans (CT  AP) after 3 additional cycles and to see Dr Duard Brady then. She had some elevation in CA 125, which may be related to surgery itself or break in chemo around surgery, will follow. Might consider addition of avastin if present regimen needs adjustment.  She will get her D15 of C5 tomorrow 11/08/2012 and start C6D1 on 11/15/2012.  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  Per IR MD, filters can be removed at any time, but probably optimal to do this within 6 months if desired, however with extent of her clot in IVC and pelvic veins, and fact that she will likely not achieve long term CR, it may be best not to remove at all. Patient is in agreement with leaving the IVC filter in for now. 3.intermittent epigastric pain: improved with prn carafate in addition to regular H2 blocker. Note single dose [name] resolved one  further episode of abdominal cramping since last visit. 4.long tobacco recently discontinued.  CT chest 08-19-12 5.overdue mammograms: no obvious significant concerns on PE  6.ruptured appendix remotely  7.basal cell carcinomas  8. Pneumovax given  9.Advance Directives and HCPOA done, but full support to that point 10. 11 mm nodule upper outer left breast: last mammograms in EMR and per my consult note of 05-2012 were in 2008. Will try to get these done, as she should be able to tolerate them now. (this information noted after visit and we will be in touch with patient by phone).        Zachery Dakins, MD   11/07/2012, 9:39 AM

## 2012-11-08 ENCOUNTER — Ambulatory Visit (HOSPITAL_BASED_OUTPATIENT_CLINIC_OR_DEPARTMENT_OTHER): Payer: Medicare Other

## 2012-11-08 ENCOUNTER — Telehealth: Payer: Self-pay | Admitting: *Deleted

## 2012-11-08 VITALS — BP 112/60 | HR 74 | Temp 98.6°F | Resp 18

## 2012-11-08 DIAGNOSIS — Z5111 Encounter for antineoplastic chemotherapy: Secondary | ICD-10-CM

## 2012-11-08 DIAGNOSIS — C569 Malignant neoplasm of unspecified ovary: Secondary | ICD-10-CM

## 2012-11-08 MED ORDER — DEXAMETHASONE SODIUM PHOSPHATE 20 MG/5ML IJ SOLN
20.0000 mg | Freq: Once | INTRAMUSCULAR | Status: AC
  Administered 2012-11-08: 20 mg via INTRAVENOUS

## 2012-11-08 MED ORDER — SODIUM CHLORIDE 0.9 % IV SOLN
80.0000 mg/m2 | Freq: Once | INTRAVENOUS | Status: AC
  Administered 2012-11-08: 144 mg via INTRAVENOUS
  Filled 2012-11-08: qty 24

## 2012-11-08 MED ORDER — DIPHENHYDRAMINE HCL 50 MG/ML IJ SOLN
25.0000 mg | Freq: Once | INTRAMUSCULAR | Status: AC
  Administered 2012-11-08: 25 mg via INTRAVENOUS

## 2012-11-08 MED ORDER — SODIUM CHLORIDE 0.9 % IV SOLN
Freq: Once | INTRAVENOUS | Status: AC
  Administered 2012-11-08: 10:00:00 via INTRAVENOUS

## 2012-11-08 MED ORDER — HEPARIN SOD (PORK) LOCK FLUSH 100 UNIT/ML IV SOLN
500.0000 [IU] | Freq: Once | INTRAVENOUS | Status: DC | PRN
  Filled 2012-11-08: qty 5

## 2012-11-08 MED ORDER — SODIUM CHLORIDE 0.9 % IJ SOLN
10.0000 mL | INTRAMUSCULAR | Status: DC | PRN
  Filled 2012-11-08: qty 10

## 2012-11-08 MED ORDER — SODIUM CHLORIDE 0.9 % IV SOLN
50.0000 mg | Freq: Once | INTRAVENOUS | Status: AC
  Administered 2012-11-08: 50 mg via INTRAVENOUS
  Filled 2012-11-08: qty 2

## 2012-11-08 MED ORDER — ONDANSETRON 8 MG/50ML IVPB (CHCC)
8.0000 mg | Freq: Once | INTRAVENOUS | Status: AC
  Administered 2012-11-08: 8 mg via INTRAVENOUS

## 2012-11-08 MED ORDER — FAMOTIDINE IN NACL 20-0.9 MG/50ML-% IV SOLN: 20.0000 mg | Freq: Once | INTRAVENOUS | Status: DC

## 2012-11-08 NOTE — Telephone Encounter (Signed)
Per staff message and POF I have scheduled appts.  JMW  

## 2012-11-08 NOTE — Patient Instructions (Signed)
[facility] Discharge Instructions for Patients Receiving Chemotherapy  Today you received the following chemotherapy agents: taxol  To help prevent nausea and vomiting after your treatment, we encourage you to take your nausea medication.  Take it as often as prescribed.     If you develop nausea and vomiting that is not controlled by your nausea medication, call the clinic. If it is after clinic hours your family physician or the after hours number for the clinic or go to the Emergency Department.   BELOW ARE SYMPTOMS THAT SHOULD BE REPORTED IMMEDIATELY:  *FEVER GREATER THAN 100.5 F  *CHILLS WITH OR WITHOUT FEVER  NAUSEA AND VOMITING THAT IS NOT CONTROLLED WITH YOUR NAUSEA MEDICATION  *UNUSUAL SHORTNESS OF BREATH  *UNUSUAL BRUISING OR BLEEDING  TENDERNESS IN MOUTH AND THROAT WITH OR WITHOUT PRESENCE OF ULCERS  *URINARY PROBLEMS  *BOWEL PROBLEMS  UNUSUAL RASH Items with * indicate a potential emergency and should be followed up as soon as possible.  Feel free to call the clinic you have any questions or concerns. The clinic phone number is [phone].   I have been informed and understand all the instructions given to me. I know to contact the clinic, my physician, or go to the Emergency Department if any problems should occur. I do not have any questions at this time, but understand that I may call the clinic during office hours   should I have any questions or need assistance in obtaining follow up care.    __________________________________________  _____________  __________ Signature of Patient or Authorized Representative            Date                   Time    __________________________________________ Nurse's Signature    

## 2012-11-09 ENCOUNTER — Ambulatory Visit (HOSPITAL_BASED_OUTPATIENT_CLINIC_OR_DEPARTMENT_OTHER): Payer: Medicare Other

## 2012-11-09 VITALS — BP 116/63 | HR 69 | Temp 98.0°F | Resp 20

## 2012-11-09 DIAGNOSIS — C569 Malignant neoplasm of unspecified ovary: Secondary | ICD-10-CM

## 2012-11-09 DIAGNOSIS — Z5189 Encounter for other specified aftercare: Secondary | ICD-10-CM

## 2012-11-09 MED ORDER — FILGRASTIM 300 MCG/0.5ML IJ SOLN
300.0000 ug | Freq: Once | INTRAMUSCULAR | Status: AC
  Administered 2012-11-09: 300 ug via SUBCUTANEOUS

## 2012-11-09 NOTE — Patient Instructions (Signed)
[facility] Discharge Instructions for Patients Receiving Chemotherapy  Today you received chemotherapy To help prevent nausea and vomiting after your treatment, we encourage you to take your nausea medication  If you develop nausea and vomiting that is not controlled by your nausea medication, call the clinic.   BELOW ARE SYMPTOMS THAT SHOULD BE REPORTED IMMEDIATELY:  *FEVER GREATER THAN 100.5 F  *CHILLS WITH OR WITHOUT FEVER  NAUSEA AND VOMITING THAT IS NOT CONTROLLED WITH YOUR NAUSEA MEDICATION  *UNUSUAL SHORTNESS OF BREATH  *UNUSUAL BRUISING OR BLEEDING  TENDERNESS IN MOUTH AND THROAT WITH OR WITHOUT PRESENCE OF ULCERS  *URINARY PROBLEMS  *BOWEL PROBLEMS  UNUSUAL RASH Items with * indicate a potential emergency and should be followed up as soon as possible.  Feel free to call the clinic you have any questions or concerns. The clinic phone number is [phone].

## 2012-11-11 ENCOUNTER — Telehealth: Payer: Self-pay | Admitting: Oncology

## 2012-11-11 NOTE — Telephone Encounter (Signed)
sw,, pt and advised on added lab and injs.Marland KitchenMarland Kitchenpt wanted to see AJ 8.7.14 and tx on 8.8.14.Marland KitchenMarland KitchenDone...pt ok and aware

## 2012-11-14 ENCOUNTER — Other Ambulatory Visit: Payer: Self-pay

## 2012-11-14 ENCOUNTER — Other Ambulatory Visit (HOSPITAL_BASED_OUTPATIENT_CLINIC_OR_DEPARTMENT_OTHER): Payer: Medicare Other | Admitting: Lab

## 2012-11-14 ENCOUNTER — Other Ambulatory Visit: Payer: Medicare Other | Admitting: Lab

## 2012-11-14 DIAGNOSIS — C569 Malignant neoplasm of unspecified ovary: Secondary | ICD-10-CM

## 2012-11-14 LAB — COMPREHENSIVE METABOLIC PANEL (CC13)
Albumin: 3.5 g/dL (ref 3.5–5.0)
BUN: 17.1 mg/dL (ref 7.0–26.0)
Calcium: 9.4 mg/dL (ref 8.4–10.4)
Chloride: 105 mEq/L (ref 98–109)
Glucose: 97 mg/dl (ref 70–140)
Potassium: 4.1 mEq/L (ref 3.5–5.1)

## 2012-11-14 LAB — CBC WITH DIFFERENTIAL/PLATELET
Basophils Absolute: 0 10*3/uL (ref 0.0–0.1)
Eosinophils Absolute: 0 10*3/uL (ref 0.0–0.5)
HCT: 32.6 % — ABNORMAL LOW (ref 34.8–46.6)
HGB: 10.7 g/dL — ABNORMAL LOW (ref 11.6–15.9)
MCV: 94.4 fL (ref 79.5–101.0)
NEUT#: 2.8 10*3/uL (ref 1.5–6.5)
NEUT%: 69.6 % (ref 38.4–76.8)
RDW: 17.7 % — ABNORMAL HIGH (ref 11.2–14.5)
lymph#: 0.9 10*3/uL (ref 0.9–3.3)

## 2012-11-15 ENCOUNTER — Other Ambulatory Visit: Payer: Medicare Other | Admitting: Lab

## 2012-11-15 ENCOUNTER — Ambulatory Visit: Payer: Medicare Other

## 2012-11-15 LAB — CA 125: CA 125: 318.8 U/mL — ABNORMAL HIGH (ref 0.0–30.2)

## 2012-11-16 ENCOUNTER — Ambulatory Visit: Payer: Medicare Other

## 2012-11-19 ENCOUNTER — Ambulatory Visit (HOSPITAL_COMMUNITY)
Admission: RE | Admit: 2012-11-19 | Discharge: 2012-11-19 | Disposition: A | Discharge status: Home / Self Care | Payer: Medicare Other | Source: Ambulatory Visit | Attending: Hematology and Oncology | Admitting: Hematology and Oncology

## 2012-11-19 ENCOUNTER — Other Ambulatory Visit: Payer: Self-pay | Admitting: Hematology and Oncology

## 2012-11-19 DIAGNOSIS — C569 Malignant neoplasm of unspecified ovary: Secondary | ICD-10-CM | POA: Insufficient documentation

## 2012-11-19 DIAGNOSIS — Z9221 Personal history of antineoplastic chemotherapy: Secondary | ICD-10-CM | POA: Insufficient documentation

## 2012-11-19 DIAGNOSIS — E041 Nontoxic single thyroid nodule: Secondary | ICD-10-CM | POA: Insufficient documentation

## 2012-11-19 DIAGNOSIS — K668 Other specified disorders of peritoneum: Secondary | ICD-10-CM | POA: Insufficient documentation

## 2012-11-19 MED ORDER — IOHEXOL 300 MG/ML  SOLN
50.0000 mL | Freq: Once | INTRAMUSCULAR | Status: AC | PRN
  Administered 2012-11-19: 50 mL via ORAL

## 2012-11-19 MED ORDER — IOHEXOL 300 MG/ML  SOLN
100.0000 mL | Freq: Once | INTRAMUSCULAR | Status: AC | PRN
  Administered 2012-11-19: 100 mL via INTRAVENOUS

## 2012-11-21 ENCOUNTER — Other Ambulatory Visit: Payer: Self-pay

## 2012-11-21 ENCOUNTER — Telehealth: Payer: Self-pay | Admitting: Gynecologic Oncology

## 2012-11-21 ENCOUNTER — Telehealth: Payer: Self-pay

## 2012-11-21 DIAGNOSIS — C569 Malignant neoplasm of unspecified ovary: Secondary | ICD-10-CM

## 2012-11-21 NOTE — Telephone Encounter (Signed)
lvm that we are cancelling tomorrow's appt and going to schedule echo and MD/chemo next week

## 2012-11-21 NOTE — Telephone Encounter (Signed)
TC with patient. We have discussed CA-125 and CT scan findings.  We discussed chemotherapy options. After our discussion we will proceed with liposomal doxorubicin q 4 weeks with bev q 2 weeks. Risks and side effects of both agents were discussed with the patient.  We will contact Dr. Karel Jarvis. PG

## 2012-11-22 ENCOUNTER — Other Ambulatory Visit: Payer: Medicare Other | Admitting: Lab

## 2012-11-22 ENCOUNTER — Ambulatory Visit: Payer: Medicare Other

## 2012-11-22 ENCOUNTER — Other Ambulatory Visit: Payer: Self-pay

## 2012-11-22 ENCOUNTER — Telehealth: Payer: Self-pay | Admitting: Hematology and Oncology

## 2012-11-23 ENCOUNTER — Ambulatory Visit: Payer: Medicare Other

## 2012-11-25 ENCOUNTER — Telehealth: Payer: Self-pay | Admitting: Hematology and Oncology

## 2012-11-25 ENCOUNTER — Telehealth: Payer: Self-pay | Admitting: Medical Oncology

## 2012-11-25 NOTE — Telephone Encounter (Signed)
Pt called and is concerned she has not gotten an appointment for her echo. She states she has not had chemo in 3 weeks and would like to get her treatment. I spoke with Dewayne Hatch and she states that pt needs a precert before she can schedule it. I called Ebony and she will call for the precert.

## 2012-11-25 NOTE — Telephone Encounter (Signed)
I called and left pt a message that I had spoken with Karel Jarvis and she did get the precert. Dewayne Hatch is aware and will call her with an appointment. I asked the pt to call us if she does not get a call about this appointment.

## 2012-11-27 ENCOUNTER — Ambulatory Visit (HOSPITAL_COMMUNITY)
Admission: RE | Admit: 2012-11-27 | Discharge: 2012-11-27 | Disposition: A | Discharge status: Home / Self Care | Payer: Medicare Other | Source: Ambulatory Visit | Attending: Hematology and Oncology | Admitting: Hematology and Oncology

## 2012-11-27 DIAGNOSIS — C569 Malignant neoplasm of unspecified ovary: Secondary | ICD-10-CM | POA: Insufficient documentation

## 2012-11-27 DIAGNOSIS — Z86718 Personal history of other venous thrombosis and embolism: Secondary | ICD-10-CM | POA: Insufficient documentation

## 2012-11-27 DIAGNOSIS — I059 Rheumatic mitral valve disease, unspecified: Secondary | ICD-10-CM | POA: Insufficient documentation

## 2012-11-27 DIAGNOSIS — Z87891 Personal history of nicotine dependence: Secondary | ICD-10-CM | POA: Insufficient documentation

## 2012-11-27 DIAGNOSIS — I517 Cardiomegaly: Secondary | ICD-10-CM | POA: Insufficient documentation

## 2012-11-27 DIAGNOSIS — I519 Heart disease, unspecified: Secondary | ICD-10-CM | POA: Insufficient documentation

## 2012-11-27 NOTE — Progress Notes (Signed)
*  PRELIMINARY RESULTS* Echocardiogram 2D Echocardiogram has been performed.  Monique [name] 11/27/2012, 11:10 AM

## 2012-11-28 ENCOUNTER — Ambulatory Visit: Payer: Medicare Other

## 2012-11-28 ENCOUNTER — Other Ambulatory Visit (HOSPITAL_BASED_OUTPATIENT_CLINIC_OR_DEPARTMENT_OTHER): Payer: Medicare Other

## 2012-11-28 ENCOUNTER — Ambulatory Visit (HOSPITAL_BASED_OUTPATIENT_CLINIC_OR_DEPARTMENT_OTHER): Payer: Medicare Other | Admitting: Hematology and Oncology

## 2012-11-28 VITALS — BP 102/64 | HR 74 | Temp 98.5°F | Resp 18 | Ht 67.0 in | Wt 149.8 lb

## 2012-11-28 DIAGNOSIS — C569 Malignant neoplasm of unspecified ovary: Secondary | ICD-10-CM

## 2012-11-28 DIAGNOSIS — I509 Heart failure, unspecified: Secondary | ICD-10-CM

## 2012-11-28 DIAGNOSIS — R928 Other abnormal and inconclusive findings on diagnostic imaging of breast: Secondary | ICD-10-CM

## 2012-11-28 DIAGNOSIS — I749 Embolism and thrombosis of unspecified artery: Secondary | ICD-10-CM

## 2012-11-28 LAB — CBC WITH DIFFERENTIAL/PLATELET
Basophils Absolute: 0 10*3/uL (ref 0.0–0.1)
Eosinophils Absolute: 0.1 10*3/uL (ref 0.0–0.5)
HCT: 34.8 % (ref 34.8–46.6)
HGB: 11.5 g/dL — ABNORMAL LOW (ref 11.6–15.9)
MCV: 93.2 fL (ref 79.5–101.0)
MONO%: 11.7 % (ref 0.0–14.0)
NEUT#: 3.3 10*3/uL (ref 1.5–6.5)
Platelets: 176 10*3/uL (ref 145–400)
RDW: 17.6 % — ABNORMAL HIGH (ref 11.2–14.5)

## 2012-11-28 LAB — BASIC METABOLIC PANEL (CC13)
BUN: 15 mg/dL (ref 7.0–26.0)
Chloride: 106 mEq/L (ref 98–109)
Glucose: 84 mg/dl (ref 70–140)
Potassium: 4.5 mEq/L (ref 3.5–5.1)

## 2012-11-28 NOTE — Progress Notes (Signed)
OFFICE PROGRESS NOTE   11/28/2012   Physicians:P.Lazaro Arms, MD (PCP [facility] [place] Elpidio Anis, Verdis Prime)   INTERVAL HISTORY:   Patient is seen, alone for visit, in continuing attention to ovarian cancer, treated to this point with 3 cycles of neoadjuvant chemotherapy, interval suboptimal debulking on 09-10-12 and now back on dose dense carbo/taxol. Status post 2 more cycles i.e. to a total of 5 cycles of carboplatin Taxol. Patient was having a rising C CA 125 which prompt restaging CAT scan that was done on July 29 of 2014 revealing disease progression. Therefore the fifth cycle of carbo/taxol was discontinued and the plan was to proceed with single agent Doxil. Patient had echocardiogram done in preparation for the Doxil treatments on 11/27/2012 which showed global hypokinesis with ejection fraction of 15%. Therefore Doxil treatment plan was canceled, and patient was brought to the clinic to discuss the result of the echo, CAT scan and further plan. In the clinic today patient says that she is feeling well, has no abdominal pain, remains active with mild dyspnea on exertion, denied any orthopnea, denied any chest pain or shortness of breath.   She continues xarelto which was begun on presentation due to extensive IVC and pelvic DVT; she also has IVC filter in which we have decided to leave for now. She is tolerating chemotherapy well now, day 15 cycle 4 given 10-11-12 and day 1 cycle 5 on 10-18-12. She does not have PAC.  Oncologic History  Patient developed constipation with some right abdominal pain in May 2013. She saw GI physician in ~ Aug 2013, but was unable to tolerate prep for colonoscopy such that this was never accomplished. Constipation improved with probiotics and Activia. She had abdominal fullness more recently, with early satiety and some reflux symptoms. She developed LLE superficial phlebitis in Jan 2014 (dopplers by PCP negative for DVT then),  followed by swelling and discomfort in left medial thigh with venous doppler at [facility] in [facility] on 05-21-12 with acute thrombus left common femoral vein. 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 She was begun on lovenox and transitioned to Xarelto by Dr Everlene Other, presently on xarelto 20 mg daily. CA 125 was 569 initially. She saw Dr Duard Brady on 06-12-12, exam remarkable for distended abdomen with fluid wave and 15 cm fixed mass in pelvis. 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 Neoadjuvant dose dense carbo/taxol was beun 06-21-12 with 3 cycles given thru 08-16-12. She needed neupogen days 2,8 and 16 on this regimen. She had good partial response by CTs 08-19-12 and had improvement in CA 125 from 1041 as baseline for the chemotherapy in late Feb 2014 down to 92 on  08-21-12 after 3 cycles. She went to exploratory laparotomy with extensive lysis of adhesions, left ureteral lysis, and bilateral salpingo-oophorectomy by Dr Duard Brady at [facility] on 09-10-12, which was suboptimal debulking. Operative findings: Diffuse carcinomatosis of the residual omentum involving the entire transverse colon. Transverse colon densely adherent  to the anterior abdominal wall. No disease throughout the small bowel. Mesenteric adenopathy with lymph nodes measuring 1 cm, involving the root of the small bowel mesentery. It centimeter right ovarian mass and a 6 cm left ovarian mass. Small bowel densely adherent to the pelvis. 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   Review of Systems She is doing well, denied any numbness. Energy and appetite are great and activity level is good.  Varicose veins very prominent but not painful, discussed support stockings in preference to any invasive treatment now due to rest of medical situation Remainder of 10 point Review of Systems negative.  Objective:  Vital signs in last 24 hours:  BP 102/64  Pulse 74  Temp(Src) 98.5 F (36.9 C) (Oral)  Resp 18  Ht 5\' 7"  (1.702 m)  Wt 149 lb 12.8 oz (67.949 kg)  BMI 23.46 kg/m2  Easily ambulatory, looks comfortable, wearing wig.  HEENT:PERRLA, sclera clear, anicteric and oropharynx clear, no lesions LymphaticsCervical, supraclavicular, and axillary nodes normal.No inguinal adenopathy Resp: clear to auscultation bilaterally and normal percussion bilaterally Cardio: regular rate and rhythm GI: soft, not distended, midline incision closed, no tenderness or erythema, some BS, no HSM or mass Extremities: extremities normal, atraumatic, no cyanosis or edema Prominent large and small varicose veins bilat LE, no cords or tenderness. Neuro:no sensory deficits noted Breast:normal without suspicious masses, skin or nipple changes or axillary nodes Skin without rash or ecchymosis  Lab  Results:  Results for orders placed in visit on 11/28/12  CBC WITH DIFFERENTIAL      Result Value Range   WBC 5.0  3.9 - 10.3 10e3/uL   NEUT# 3.3  1.5 - 6.5 10e3/uL   HGB 11.5 (*) 11.6 - 15.9 g/dL   HCT 14.7  82.9 - 56.2 %   Platelets 176  145 - 400 10e3/uL   MCV 93.2  79.5 - 101.0 fL   MCH 30.7  25.1 - 34.0 pg   MCHC 33.0  31.5 - 36.0 g/dL   RBC 1.30  8.65 - 7.84 10e6/uL   RDW 17.6 (*) 11.2 - 14.5 %   lymph# 0.9  0.9 - 3.3 10e3/uL   MONO# 0.6  0.1 - 0.9 10e3/uL   Eosinophils Absolute 0.1  0.0 - 0.5 10e3/uL   Basophils Absolute 0.0  0.0 - 0.1 10e3/uL   NEUT% 66.4  38.4 - 76.8 %   LYMPH% 18.8  14.0 - 49.7 %   MONO% 11.7  0.0 - 14.0 %   EOS% 2.4  0.0 - 7.0 %   BASO% 0.7  0.0 - 2.0 %  BASIC METABOLIC PANEL (CC13)      Result Value Range   Sodium 143  136 - 145 mEq/L   Potassium 4.5  3.5 - 5.1 mEq/L   Chloride 106  98 - 109 mEq/L   CO2 27  22 - 29 mEq/L  Glucose 84  70 - 140 mg/dl   BUN 30.8  7.0 - 65.7 mg/dL   Creatinine 0.7  0.6 - 1.1 mg/dL   Calcium 9.8  8.4 - 84.6 mg/dL    CA 962 available after visit 130, this having been 92 in April and likely still reflects some increase related to surgery. Will follow. Studies/Results:  No results found.  Medications: I have reviewed the patient's current medications.   CT 0/29/2014   IMPRESSION:  1. Increased nodular thickening along the falciform ligament is  concerning for residual / progressive peritoneal disease.  2. Small peritoneal nodule in the right iliac fossa is stable.   Assessment/Plan:  1. 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  however, she had an elevation of the CA 125 and followup CAT scan showed disease progression therefore the 6 cycle of carbo/Taxol was canceled. She received total 5 cycles.  -In the clinic today  with Ms. angulo we reviewed the CAT scan result as well as the echo result. We explained the finding of both tests to her. We informed her that since a CT scan is showing disease progression, there is no point of continuing her current therapy with carbo/taxol and we would need to choose a different agent. Option could be single agent Doxil, gemcitabine, gemcitabine plus carboplatin or oral etoposide. Giving the cardiac function we can we can use Doxil and at this point the other option would be single agent gemcitabine. We went over the side effect of gemcitabine with patient in details. Plan to start her her treatments on Monday, August 11. Treatment would be given every other week.   We also informed her that she needs further evaluation by a cardiologist to investigate the underlying etiology of the congestive heart failure specially was a global hypokinesia seen on the echocardiogram, which is  concerning for underlying coronary artery disease. Also she will need further management of the congestive heart failure. Patient saw Dr. Katrinka Blazing (cardiologist) in the past, back in 2011, we have called his office and appointment was made for patient to be seen at his office tomorrow.  Dr. Nelly Rout came and saw the patient in our clinic as well and discussed the CT scan finding as well as the plan of care with her as well. Patient in a agreement with current plan and as mentioned above plan to proceed with single agent gemcitabine initiating treatment on December 02, 2012  With treatment on every other week schedule.   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  Per IR MD, filters can be removed at any time, but probably optimal to do this within 6 months if desired, however with extent of her clot in IVC and pelvic veins, and fact that she will likely not achieve long term CR, it may be best not to remove at all. Patient is in agreement with  leaving the IVC filter in for now. 3.intermittent epigastric pain: improved with prn carafate in addition to regular H2 blocker. Note single dose [name] resolved one further episode of abdominal cramping since last visit. 4.long tobacco recently discontinued.   5.overdue mammograms: no obvious significant concerns on PE  6.ruptured appendix remotely  7.basal cell carcinomas  8. Pneumovax given  9.Advance Directives and HCPOA done, but full  support to that point 10. 11 mm nodule upper outer left breast: last mammograms in EMR and per my consult note of 05-2012 were in 2008. Will try to get these done, as she should be able to tolerate them now. (this information noted after visit and we will be in touch with patient by phone).  Zachery Dakins, MD   11/28/2012, 3:59 PM

## 2012-11-29 ENCOUNTER — Ambulatory Visit: Payer: Medicare Other

## 2012-11-29 ENCOUNTER — Telehealth: Payer: Self-pay | Admitting: *Deleted

## 2012-11-29 ENCOUNTER — Ambulatory Visit: Payer: Medicare Other | Admitting: Physician Assistant

## 2012-11-29 ENCOUNTER — Other Ambulatory Visit: Payer: Medicare Other | Admitting: Lab

## 2012-11-29 NOTE — Telephone Encounter (Signed)
Lm gv appt d/t for 12/16/12 with labs@ 9:15am, ov @ 9:45, and tx to follow. i emailed MB to add the tx. Pt will get avs on 12/02/12...td

## 2012-11-30 ENCOUNTER — Ambulatory Visit: Payer: Medicare Other

## 2012-12-02 ENCOUNTER — Ambulatory Visit (HOSPITAL_BASED_OUTPATIENT_CLINIC_OR_DEPARTMENT_OTHER): Payer: Medicare Other

## 2012-12-02 ENCOUNTER — Other Ambulatory Visit: Payer: Self-pay | Admitting: Medical Oncology

## 2012-12-02 ENCOUNTER — Other Ambulatory Visit: Payer: Self-pay | Admitting: Hematology and Oncology

## 2012-12-02 ENCOUNTER — Telehealth: Payer: Self-pay | Admitting: *Deleted

## 2012-12-02 ENCOUNTER — Ambulatory Visit (HOSPITAL_COMMUNITY)
Admission: RE | Admit: 2012-12-02 | Discharge: 2012-12-02 | Disposition: A | Discharge status: Home / Self Care | Payer: Medicare Other | Source: Ambulatory Visit | Attending: Hematology and Oncology | Admitting: Hematology and Oncology

## 2012-12-02 ENCOUNTER — Other Ambulatory Visit (HOSPITAL_BASED_OUTPATIENT_CLINIC_OR_DEPARTMENT_OTHER): Payer: Medicare Other

## 2012-12-02 VITALS — BP 107/70 | HR 68 | Temp 98.3°F

## 2012-12-02 DIAGNOSIS — C569 Malignant neoplasm of unspecified ovary: Secondary | ICD-10-CM

## 2012-12-02 DIAGNOSIS — Z86718 Personal history of other venous thrombosis and embolism: Secondary | ICD-10-CM

## 2012-12-02 DIAGNOSIS — Z5111 Encounter for antineoplastic chemotherapy: Secondary | ICD-10-CM

## 2012-12-02 DIAGNOSIS — I83893 Varicose veins of bilateral lower extremities with other complications: Secondary | ICD-10-CM | POA: Insufficient documentation

## 2012-12-02 DIAGNOSIS — M7120 Synovial cyst of popliteal space [Baker], unspecified knee: Secondary | ICD-10-CM | POA: Insufficient documentation

## 2012-12-02 LAB — CBC WITH DIFFERENTIAL/PLATELET
BASO%: 0.5 % (ref 0.0–2.0)
EOS%: 2.1 % (ref 0.0–7.0)
HCT: 35.9 % (ref 34.8–46.6)
LYMPH%: 19.5 % (ref 14.0–49.7)
MCH: 29.6 pg (ref 25.1–34.0)
MCHC: 32 g/dL (ref 31.5–36.0)
MONO%: 11.8 % (ref 0.0–14.0)
NEUT%: 66.1 % (ref 38.4–76.8)
Platelets: 138 10*3/uL — ABNORMAL LOW (ref 145–400)

## 2012-12-02 MED ORDER — SODIUM CHLORIDE 0.9 % IV SOLN
800.0000 mg/m2 | Freq: Once | INTRAVENOUS | Status: AC
  Administered 2012-12-02: 1444 mg via INTRAVENOUS
  Filled 2012-12-02: qty 38

## 2012-12-02 MED ORDER — DEXAMETHASONE SODIUM PHOSPHATE 10 MG/ML IJ SOLN
4.0000 mg | Freq: Once | INTRAMUSCULAR | Status: AC
  Administered 2012-12-02: 4 mg via INTRAVENOUS

## 2012-12-02 MED ORDER — ONDANSETRON 8 MG/50ML IVPB (CHCC)
8.0000 mg | Freq: Once | INTRAVENOUS | Status: AC
  Administered 2012-12-02: 8 mg via INTRAVENOUS

## 2012-12-02 MED ORDER — SODIUM CHLORIDE 0.9 % IV SOLN
Freq: Once | INTRAVENOUS | Status: AC
  Administered 2012-12-02: 10:00:00 via INTRAVENOUS

## 2012-12-02 NOTE — Patient Instructions (Signed)
[facility] Discharge Instructions for Patients Receiving Chemotherapy  Today you received the following chemotherapy agents Gemzar To help prevent nausea and vomiting after your treatment, we encourage you to take your nausea medication as prescribed. If you develop nausea and vomiting that is not controlled by your nausea medication, call the clinic.   BELOW ARE SYMPTOMS THAT SHOULD BE REPORTED IMMEDIATELY:  *FEVER GREATER THAN 100.5 F  *CHILLS WITH OR WITHOUT FEVER  NAUSEA AND VOMITING THAT IS NOT CONTROLLED WITH YOUR NAUSEA MEDICATION  *UNUSUAL SHORTNESS OF BREATH  *UNUSUAL BRUISING OR BLEEDING  TENDERNESS IN MOUTH AND THROAT WITH OR WITHOUT PRESENCE OF ULCERS  *URINARY PROBLEMS  *BOWEL PROBLEMS  UNUSUAL RASH Items with * indicate a potential emergency and should be followed up as soon as possible.  Feel free to call the clinic you have any questions or concerns. The clinic phone number is [phone].     Gemcitabine injection What is this medicine? GEMCITABINE (jem SIT a been) is a chemotherapy drug. This medicine is used to treat many types of cancer like breast cancer, lung cancer, pancreatic cancer, and ovarian cancer. This medicine may be used for other purposes; ask your health care provider or pharmacist if you have questions. What should I tell my health care provider before I take this medicine? They need to know if you have any of these conditions: -blood disorders -infection -kidney disease -liver disease -recent or ongoing radiation therapy -an unusual or allergic reaction to gemcitabine, other chemotherapy, other medicines, foods, dyes, or preservatives -pregnant or trying to get pregnant -breast-feeding How should I use this medicine? This drug is given as an infusion into a vein. It is administered in a hospital or clinic by a specially trained health care professional. Talk to your pediatrician regarding the use of this medicine  in children. Special care may be needed. Overdosage: If you think you have taken too much of this medicine contact a poison control center or emergency room at once. NOTE: This medicine is only for you. Do not share this medicine with others. What if I miss a dose? It is important not to miss your dose. Call your doctor or health care professional if you are unable to keep an appointment. What may interact with this medicine? -medicines to increase blood counts like filgrastim, pegfilgrastim, sargramostim -some other chemotherapy drugs like cisplatin -vaccines Talk to your doctor or health care professional before taking any of these medicines: -acetaminophen -aspirin -ibuprofen -ketoprofen -naproxen This list may not describe all possible interactions. Give your health care provider a list of all the medicines, herbs, non-prescription drugs, or dietary supplements you use. Also tell them if you smoke, drink alcohol, or use illegal drugs. Some items may interact with your medicine. What should I watch for while using this medicine? Visit your doctor for checks on your progress. This drug may make you feel generally unwell. This is not uncommon, as chemotherapy can affect healthy cells as well as cancer cells. Report any side effects. Continue your course of treatment even though you feel ill unless your doctor tells you to stop. In some cases, you may be given additional medicines to help with side effects. Follow all directions for their use. Call your doctor or health care professional for advice if you get a fever, chills or sore throat, or other symptoms of a cold or flu. Do not treat yourself. This drug decreases your body's ability to fight infections. Try to avoid being around people who  are sick. This medicine may increase your risk to bruise or bleed. Call your doctor or health care professional if you notice any unusual bleeding. Be careful brushing and flossing your teeth or using a  toothpick because you may get an infection or bleed more easily. If you have any dental work done, tell your dentist you are receiving this medicine. Avoid taking products that contain aspirin, acetaminophen, ibuprofen, naproxen, or ketoprofen unless instructed by your doctor. These medicines may hide a fever. Women should inform their doctor if they wish to become pregnant or think they might be pregnant. There is a potential for serious side effects to an unborn child. Talk to your health care professional or pharmacist for more information. Do not breast-feed an infant while taking this medicine. What side effects may I notice from receiving this medicine? Side effects that you should report to your doctor or health care professional as soon as possible: -allergic reactions like skin rash, itching or hives, swelling of the face, lips, or tongue -low blood counts - this medicine may decrease the number of white blood cells, red blood cells and platelets. You may be at increased risk for infections and bleeding. -signs of infection - fever or chills, cough, sore throat, pain or difficulty passing urine -signs of decreased platelets or bleeding - bruising, pinpoint red spots on the skin, black, tarry stools, blood in the urine -signs of decreased red blood cells - unusually weak or tired, fainting spells, lightheadedness -breathing problems -chest pain -mouth sores -nausea and vomiting -pain, swelling, redness at site where injected -pain, tingling, numbness in the hands or feet -stomach pain -swelling of ankles, feet, hands -unusual bleeding Side effects that usually do not require medical attention (report to your doctor or health care professional if they continue or are bothersome): -constipation -diarrhea -hair loss -loss of appetite -stomach upset This list may not describe all possible side effects. Call your doctor for medical advice about side effects. You may report side effects to  FDA at 1-800-FDA-1088. Where should I keep my medicine? This drug is given in a hospital or clinic and will not be stored at home. NOTE: This sheet is a summary. It may not cover all possible information. If you have questions about this medicine, talk to your doctor, pharmacist, or health care provider.  2013, [employer]. (08/20/2007 6:45:54 PM)

## 2012-12-02 NOTE — Progress Notes (Signed)
*  Preliminary Results* Right lower extremity venous duplex completed. Right lower extremity is negative for deep vein thrombosis. 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 There is evidence of a left Baker's cyst.  Preliminary results discussed with Tammy of Dr.Salem's office.  12/02/2012 12:36 PM  Gertie Fey, RVT, RDCS, RDMS

## 2012-12-02 NOTE — Progress Notes (Signed)
Patient complains of a reddened, hardened lump on her right calf. Patient states the area has been painful for a couple of weeks, but did not become reddened or swollen at the site until this past Friday. Patient states she has been taking Xarelto for about 6 months due to a thrombophlebitis on her left lower calf. Dr. Karel Jarvis notified and states he will see patient in the infusion room.   Dr. Karel Jarvis at chairside ok to treat.

## 2012-12-02 NOTE — Telephone Encounter (Signed)
Per staff message, chemo RN and POF I have scheduled appts.  JMW

## 2012-12-03 LAB — CA 125: CA 125: 1231.7 U/mL — ABNORMAL HIGH (ref 0.0–30.2)

## 2012-12-04 ENCOUNTER — Inpatient Hospital Stay (HOSPITAL_COMMUNITY)
Admission: EM | Admit: 2012-12-04 | Discharge: 2012-12-11 | DRG: 064 | Disposition: A | Discharge status: Home / Self Care | Payer: Medicare Other | Attending: Neurology | Admitting: Neurology

## 2012-12-04 ENCOUNTER — Encounter (HOSPITAL_COMMUNITY): Payer: Self-pay | Admitting: Emergency Medicine

## 2012-12-04 ENCOUNTER — Telehealth: Payer: Self-pay

## 2012-12-04 ENCOUNTER — Other Ambulatory Visit: Payer: Self-pay

## 2012-12-04 ENCOUNTER — Emergency Department (HOSPITAL_COMMUNITY): Payer: Medicare Other

## 2012-12-04 ENCOUNTER — Inpatient Hospital Stay (HOSPITAL_COMMUNITY): Payer: Medicare Other

## 2012-12-04 DIAGNOSIS — I4729 Other ventricular tachycardia: Secondary | ICD-10-CM

## 2012-12-04 DIAGNOSIS — I5043 Acute on chronic combined systolic (congestive) and diastolic (congestive) heart failure: Secondary | ICD-10-CM

## 2012-12-04 DIAGNOSIS — D6859 Other primary thrombophilia: Secondary | ICD-10-CM | POA: Diagnosis present

## 2012-12-04 DIAGNOSIS — D649 Anemia, unspecified: Secondary | ICD-10-CM | POA: Diagnosis present

## 2012-12-04 DIAGNOSIS — R4701 Aphasia: Secondary | ICD-10-CM | POA: Diagnosis present

## 2012-12-04 DIAGNOSIS — I619 Nontraumatic intracerebral hemorrhage, unspecified: Secondary | ICD-10-CM | POA: Diagnosis not present

## 2012-12-04 DIAGNOSIS — R471 Dysarthria and anarthria: Secondary | ICD-10-CM | POA: Diagnosis present

## 2012-12-04 DIAGNOSIS — D6869 Other thrombophilia: Secondary | ICD-10-CM | POA: Diagnosis present

## 2012-12-04 DIAGNOSIS — R4789 Other speech disturbances: Secondary | ICD-10-CM | POA: Diagnosis present

## 2012-12-04 DIAGNOSIS — I63432 Cerebral infarction due to embolism of left posterior cerebral artery: Secondary | ICD-10-CM | POA: Diagnosis present

## 2012-12-04 DIAGNOSIS — R748 Abnormal levels of other serum enzymes: Secondary | ICD-10-CM | POA: Diagnosis present

## 2012-12-04 DIAGNOSIS — I5020 Unspecified systolic (congestive) heart failure: Secondary | ICD-10-CM

## 2012-12-04 DIAGNOSIS — Z85828 Personal history of other malignant neoplasm of skin: Secondary | ICD-10-CM

## 2012-12-04 DIAGNOSIS — I428 Other cardiomyopathies: Secondary | ICD-10-CM | POA: Diagnosis present

## 2012-12-04 DIAGNOSIS — Z7982 Long term (current) use of aspirin: Secondary | ICD-10-CM

## 2012-12-04 DIAGNOSIS — T451X5A Adverse effect of antineoplastic and immunosuppressive drugs, initial encounter: Secondary | ICD-10-CM | POA: Diagnosis present

## 2012-12-04 DIAGNOSIS — E876 Hypokalemia: Secondary | ICD-10-CM | POA: Diagnosis not present

## 2012-12-04 DIAGNOSIS — R7989 Other specified abnormal findings of blood chemistry: Secondary | ICD-10-CM

## 2012-12-04 DIAGNOSIS — Z87891 Personal history of nicotine dependence: Secondary | ICD-10-CM

## 2012-12-04 DIAGNOSIS — I472 Ventricular tachycardia, unspecified: Secondary | ICD-10-CM | POA: Diagnosis not present

## 2012-12-04 DIAGNOSIS — F411 Generalized anxiety disorder: Secondary | ICD-10-CM | POA: Diagnosis present

## 2012-12-04 DIAGNOSIS — I5023 Acute on chronic systolic (congestive) heart failure: Secondary | ICD-10-CM | POA: Diagnosis present

## 2012-12-04 DIAGNOSIS — R29810 Facial weakness: Secondary | ICD-10-CM | POA: Diagnosis present

## 2012-12-04 DIAGNOSIS — C569 Malignant neoplasm of unspecified ovary: Secondary | ICD-10-CM

## 2012-12-04 DIAGNOSIS — I639 Cerebral infarction, unspecified: Secondary | ICD-10-CM | POA: Diagnosis present

## 2012-12-04 DIAGNOSIS — I6350 Cerebral infarction due to unspecified occlusion or stenosis of unspecified cerebral artery: Secondary | ICD-10-CM

## 2012-12-04 DIAGNOSIS — R29898 Other symptoms and signs involving the musculoskeletal system: Secondary | ICD-10-CM | POA: Diagnosis present

## 2012-12-04 DIAGNOSIS — I5022 Chronic systolic (congestive) heart failure: Secondary | ICD-10-CM | POA: Diagnosis present

## 2012-12-04 DIAGNOSIS — K219 Gastro-esophageal reflux disease without esophagitis: Secondary | ICD-10-CM | POA: Diagnosis present

## 2012-12-04 DIAGNOSIS — Z79899 Other long term (current) drug therapy: Secondary | ICD-10-CM

## 2012-12-04 DIAGNOSIS — I509 Heart failure, unspecified: Secondary | ICD-10-CM | POA: Diagnosis present

## 2012-12-04 DIAGNOSIS — Z86718 Personal history of other venous thrombosis and embolism: Secondary | ICD-10-CM

## 2012-12-04 DIAGNOSIS — I427 Cardiomyopathy due to drug and external agent: Secondary | ICD-10-CM | POA: Diagnosis present

## 2012-12-04 DIAGNOSIS — IMO0002 Reserved for concepts with insufficient information to code with codable children: Secondary | ICD-10-CM

## 2012-12-04 DIAGNOSIS — Z9221 Personal history of antineoplastic chemotherapy: Secondary | ICD-10-CM

## 2012-12-04 DIAGNOSIS — I6340 Cerebral infarction due to embolism of unspecified cerebral artery: Principal | ICD-10-CM | POA: Diagnosis present

## 2012-12-04 DIAGNOSIS — Z7901 Long term (current) use of anticoagulants: Secondary | ICD-10-CM

## 2012-12-04 DIAGNOSIS — D696 Thrombocytopenia, unspecified: Secondary | ICD-10-CM

## 2012-12-04 HISTORY — DX: Cerebral infarction, unspecified: I63.9

## 2012-12-04 LAB — CBC WITH DIFFERENTIAL/PLATELET
Basophils Absolute: 0 10*3/uL (ref 0.0–0.1)
Basophils Relative: 0 % (ref 0–1)
MCHC: 32.9 g/dL (ref 30.0–36.0)
Neutro Abs: 4.6 10*3/uL (ref 1.7–7.7)
Neutrophils Relative %: 84 % — ABNORMAL HIGH (ref 43–77)
RDW: 15.6 % — ABNORMAL HIGH (ref 11.5–15.5)

## 2012-12-04 LAB — URINALYSIS, ROUTINE W REFLEX MICROSCOPIC
Bilirubin Urine: NEGATIVE
Protein, ur: NEGATIVE mg/dL
Specific Gravity, Urine: 1.022 (ref 1.005–1.030)
Urobilinogen, UA: 0.2 mg/dL (ref 0.0–1.0)

## 2012-12-04 LAB — URINE MICROSCOPIC-ADD ON

## 2012-12-04 LAB — COMPREHENSIVE METABOLIC PANEL
Alkaline Phosphatase: 108 U/L (ref 39–117)
BUN: 15 mg/dL (ref 6–23)
CO2: 26 mEq/L (ref 19–32)
GFR calc Af Amer: >90 mL/min (ref >90)
GFR calc non Af Amer: >90 mL/min (ref >90)
Glucose, Bld: 107 mg/dL — ABNORMAL HIGH (ref 70–99)
Potassium: 3.8 mEq/L (ref 3.5–5.1)
Total Protein: 6.7 g/dL (ref 6.0–8.3)

## 2012-12-04 LAB — TROPONIN I
Troponin I: 0.95 ng/mL (ref <0.30)
Troponin I: 1 ng/mL (ref <0.30)

## 2012-12-04 LAB — PROTIME-INR: INR: 2.38 — ABNORMAL HIGH (ref 0.00–1.49)

## 2012-12-04 LAB — APTT: aPTT: 36 seconds (ref 24–37)

## 2012-12-04 MED ORDER — PANTOPRAZOLE SODIUM 40 MG PO TBEC
40.0000 mg | DELAYED_RELEASE_TABLET | Freq: Every day | ORAL | Status: DC
  Administered 2012-12-06 – 2012-12-11 (×6): 40 mg via ORAL
  Filled 2012-12-04 (×8): qty 1

## 2012-12-04 MED ORDER — HYOSCYAMINE SULFATE 0.125 MG PO TABS
0.1250 mg | ORAL_TABLET | ORAL | Status: DC
  Filled 2012-12-04 (×6): qty 1

## 2012-12-04 MED ORDER — ASPIRIN EC 81 MG PO TBEC
81.0000 mg | DELAYED_RELEASE_TABLET | Freq: Every day | ORAL | Status: DC
  Filled 2012-12-04: qty 1

## 2012-12-04 MED ORDER — LORAZEPAM 2 MG/ML IJ SOLN
0.5000 mg | Freq: Once | INTRAMUSCULAR | Status: AC
  Administered 2012-12-04: 0.5 mg via INTRAVENOUS
  Filled 2012-12-04: qty 1

## 2012-12-04 MED ORDER — ADULT MULTIVITAMIN W/MINERALS CH
1.0000 | ORAL_TABLET | Freq: Every day | ORAL | Status: DC
  Administered 2012-12-06 – 2012-12-09 (×4): 1 via ORAL
  Filled 2012-12-04 (×6): qty 1

## 2012-12-04 MED ORDER — LORAZEPAM 0.5 MG PO TABS: 0.5000 mg | ORAL_TABLET | Freq: Three times a day (TID) | ORAL | Status: DC | PRN

## 2012-12-04 MED ORDER — RIVAROXABAN 20 MG PO TABS
20.0000 mg | ORAL_TABLET | Freq: Every day | ORAL | Status: DC
  Filled 2012-12-04: qty 1

## 2012-12-04 NOTE — ED Provider Notes (Signed)
CSN: [id number]     Arrival date & time 12/04/12  1148 History     First MD Initiated Contact with Patient 12/04/12 1156     Chief Complaint  Patient presents with  . 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 Woke up this morning where she lives alone and noticed some difficulty swallowing her coffee, but then was able to eat without any difficulty. She noticed some difficulty speaking a few hours later while speaking to her dog. She called nurse at Cancer center who recommended she come to the ED. She denies any arm or leg weakness, no facial droop and no difficulty walking. She took her Xarelto and 2 baby ASA prior to arrival.   Past Medical History  Diagnosis Date  . Arthritis   . Acute venous embolism and thrombosis of unspecified deep vessels of lower extremity   . Bronchitis     several times  . Abdominal or pelvic swelling, mass or lump, unspecified site   . Phlebitis and thrombophlebitis of unspecified site   . Pelvic mass   . Skin cancer   . Ovarian cancer   . Anxiety   . GERD (gastroesophageal reflux disease)   . Anemia   . PONV (postoperative nausea and vomiting)     "when fentanyl is used, will break out in itchy rast"   Past Surgical History  Procedure Laterality Date  . Appendectomy  1966    ruptured, hospital for 3 weeks  . Rhinoplasty  1976  . Vein ligation and stripping  1998    right leg  . Mohs surgery  2004, 2005    basal cell of the face  . Tooth extraction    . Laparotomy N/A 09/10/2012    Procedure: EXPLORATORY LAPAROTOMY, BILATERAL SALPINGO OOPHORECTOMY, TUMOR DEBULKING;  Surgeon: Rejeana Brock A. Duard Brady, MD;  Location: [facility];  Service: Gynecology;  Laterality: N/A;  . Exploratory laparotomy  09/10/12    Lysis of adhesions, BSO, suboptimal tumor debulking   Family  History  Problem Relation Age of Onset  . Pneumonia Mother   . Heart attack Father    History  Substance Use Topics  . Smoking status: Former Smoker -- 30 years    Types: Cigarettes    Quit date: 04/20/2012  . Smokeless tobacco: Never Used     Comment: quit for a couple years then started back  . Alcohol Use: No   OB History   Grav Para Term Preterm Abortions TAB SAB Ect Mult Living                 Review of Systems All other systems reviewed and are negative except as noted in HPI.   Allergies  Ampicillin; Fentanyl and related; Neosporin; and Septra  Home Medications   Current Outpatient Rx  Name  Route  Sig  Dispense  Refill  . aspirin EC 81 MG tablet   Oral   Take 81 mg by mouth daily.         . hyoscyamine ([name], ANASPAZ) 0.125 MG tablet   Oral   Take 1 tablet (0.125 mg total) by mouth daily. As needed for bowel symptoms.   20 tablet   0     May substitute with med that is equivalent and on  ...   . Multiple Vitamin (MULTIVITAMIN WITH MINERALS) TABS   Oral  Take 1 tablet by mouth daily.         Marland Kitchen oxyCODONE-acetaminophen (PERCOCET/ROXICET) 5-325 MG per tablet   Oral   Take 1-2 tablets by mouth every 4 (four) hours as needed for pain.   40 tablet   0   . pantoprazole (PROTONIX) 40 MG tablet   Oral   Take 1 tablet (40 mg total) by mouth daily.   30 tablet   1   . Rivaroxaban (XARELTO) 20 MG TABS   Oral   Take 1 tablet (20 mg total) by mouth every morning.   30 tablet   3   . sucralfate (CARAFATE) 1 G tablet      Take 1 gram before meals and at bedtime as needed for GERD symptoms   60 tablet   0    BP 130/52  Pulse 102  Temp(Src) 98.1 F (36.7 C) (Oral)  Resp 20  SpO2 97% Physical Exam  Nursing note and vitals reviewed. Constitutional: She is oriented to person, place, and time. She appears well-developed and well-nourished.  HENT:  Head: Normocephalic and atraumatic.  Eyes: EOM are normal. Pupils are equal, round, and reactive  to light.  Neck: Normal range of motion. Neck supple.  Cardiovascular: Normal rate, normal heart sounds and intact distal pulses.   Pulmonary/Chest: Effort normal and breath sounds normal.  Abdominal: Bowel sounds are normal. She exhibits no distension. There is no tenderness.  Musculoskeletal: Normal range of motion. She exhibits no edema and no tenderness.  Neurological: She is alert and oriented to person, place, and time. She has normal strength. She displays normal reflexes. No cranial nerve deficit or sensory deficit. Coordination and gait normal.  Moderate dysarthria but no aphasia  Skin: Skin is warm and dry. No rash noted.  Psychiatric: She has a normal mood and affect.    ED Course   Procedures (including critical care time)  Labs Reviewed  CBC WITH DIFFERENTIAL - Abnormal; Notable for the following:    RBC 3.77 (*)    Hemoglobin 11.2 (*)    HCT 34.0 (*)    RDW 15.6 (*)    Platelets 120 (*)    Neutrophils Relative % 84 (*)    Lymphocytes Relative 9 (*)    Lymphs Abs 0.5 (*)    All other components within normal limits  PROTIME-INR  APTT  TROPONIN I  URINALYSIS, ROUTINE W REFLEX MICROSCOPIC  COMPREHENSIVE METABOLIC PANEL   Ct Head Wo Contrast  12/04/2012   *RADIOLOGY REPORT*  Clinical Data: Dysarthria and dysphagia; ovarian carcinoma  CT HEAD WITHOUT CONTRAST  Technique:  Contiguous axial images were obtained from the base of the skull through the vertex without contrast. Study was obtained within 24 hours of patient arrival at the emergency department.  Comparison: None.  Findings: There is moderate generalized ventricular enlargement. Sulci appear normal.  There is no mass, hemorrhage, extra-axial fluid collection, or midline shift.  No gray-white compartment lesions are identified. No acute infarct is appreciated on this study.  Bony calvarium appears intact.  The mastoid air cells are clear.  IMPRESSION: Ventricular enlargement without corresponding sulcal prominence.  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  No mass, hemorrhage, or acute appearing infarct.  No gray-white compartment lesions are identified.   Original Report Authenticated By: Bretta Bang, M.D.   Mr Angiogram Head Wo Contrast  12/04/2012   *RADIOLOGY REPORT*  Clinical Data:  Slurred speech. Low cardiac output.  MRI BRAIN WITHOUT CONTRAST MRA HEAD WITHOUT CONTRAST  Technique: Multiplanar,  multiecho pulse sequences of the brain and surrounding structures were obtained according to standard protocol without intravenous contrast.  Angiographic images of the head were obtained using MRA technique without contrast.  Comparison: 12/04/2012 CT.  No comparison MR.  MRI HEAD  Findings:  Scattered acute non hemorrhagic infarcts involving portions of the left sub insular region, left frontal lobe, left parietal lobe, occipital lobe bilaterally and cerebellum bilaterally.  This distribution raises possibility embolic disease.  No intracranial hemorrhage.  Scattered nonspecific white matter type changes may reflect result of small vessel disease.  Atrophy.  Ventricular prominence may be related to atrophy although not able to completely exclude a mild component hydrocephalus.  No intracranial mass lesion detected on this unenhanced exam.  IMPRESSION:  Scattered acute non hemorrhagic infarcts involving portions of the left sub insular region, left frontal lobe, left parietal lobe, occipital lobe bilaterally and cerebellum bilaterally.  This distribution raises possibility embolic disease.  No intracranial hemorrhage.  Scattered nonspecific white matter type changes may reflect result of small vessel disease.  Atrophy.  Ventricular prominence may be related to atrophy although not able to completely exclude a mild component hydrocephalus.  MRA HEAD  Findings: Anterior circulation without medium or large size vessel significant stenosis or occlusion.  Fetal type contribution to the posterior cerebral  arteries.  Ectatic vertebral arteries and basilar artery without high-grade stenosis.  Non-visualized right PICA and left AICA with narrowing and irregularity of the left PICA and right AICA.  Slightly bulbous appearance of the basilar tip and superior medial margin right posterior cerebral artery P1 segment without definitive saccular aneurysm.  Duplicated right superior cerebellar artery.  Anterior and posterior branch vessel irregularity.  IMPRESSION: Branch vessel irregularity. Please see above.  Critical Value/emergent results were called by telephone at the time of interpretation on 12/04/2012 at 4:17 p.m. to Dr. Renae Gloss, who verbally acknowledged these results.   Original Report Authenticated By: Lacy Duverney, M.D.   Mr Brain Wo Contrast  12/04/2012   *RADIOLOGY REPORT*  Clinical Data:  Slurred speech. Low cardiac output.  MRI BRAIN WITHOUT CONTRAST MRA HEAD WITHOUT CONTRAST  Technique: Multiplanar, multiecho pulse sequences of the brain and surrounding structures were obtained according to standard protocol without intravenous contrast.  Angiographic images of the head were obtained using MRA technique without contrast.  Comparison: 12/04/2012 CT.  No comparison MR.  MRI HEAD  Findings:  Scattered acute non hemorrhagic infarcts involving portions of the left sub insular region, left frontal lobe, left parietal lobe, occipital lobe bilaterally and cerebellum bilaterally.  This distribution raises possibility embolic disease.  No intracranial hemorrhage.  Scattered nonspecific white matter type changes may reflect result of small vessel disease.  Atrophy.  Ventricular prominence may be related to atrophy although not able to completely exclude a mild component hydrocephalus.  No intracranial mass lesion detected on this unenhanced exam.  IMPRESSION:  Scattered acute non hemorrhagic infarcts involving portions of the left sub insular region, left frontal lobe, left parietal lobe, occipital lobe bilaterally  and cerebellum bilaterally.  This distribution raises possibility embolic disease.  No intracranial hemorrhage.  Scattered nonspecific white matter type changes may reflect result of small vessel disease.  Atrophy.  Ventricular prominence may be related to atrophy although not able to completely exclude a mild component hydrocephalus.  MRA HEAD  Findings: Anterior circulation without medium or large size vessel significant stenosis or occlusion.  Fetal type contribution to the posterior cerebral arteries.  Ectatic vertebral arteries and basilar artery without high-grade stenosis.  Non-visualized right PICA and left AICA with narrowing and irregularity of the left PICA and right AICA.  Slightly bulbous appearance of the basilar tip and superior medial margin right posterior cerebral artery P1 segment without definitive saccular aneurysm.  Duplicated right superior cerebellar artery.  Anterior and posterior branch vessel irregularity.  IMPRESSION: Branch vessel irregularity. Please see above.  Critical Value/emergent results were called by telephone at the time of interpretation on 12/04/2012 at 4:17 p.m. to Dr. Renae Gloss, who verbally acknowledged these results.   Original Report Authenticated By: Lacy Duverney, M.D.   1. Stroke   2. Elevated troponin   3. Personal history of venous thrombosis and embolism   4. Systolic heart failure   5. Ovarian cancer, unspecified laterality   6. CVA (cerebral infarction)     MDM   Date: 12/04/2012  Rate: 89  Rhythm: normal sinus rhythm and premature ventricular contractions (PVC)  QRS Axis: left  Intervals: normal  ST/T Wave abnormalities: nonspecific T wave changes  Conduction Disutrbances:none  Narrative Interpretation:   Old EKG Reviewed: none available  Pt with no hemorrhage on CT. Trop borderline positive. Discussed with Dr. Anne Fu, Dr. Leroy Kennedy and [facility] for admission. Discussed MRI results with Dr. Constance Goltz.     Haru Shaff B. Bernette Mayers, MD 12/04/12  2142

## 2012-12-04 NOTE — ED Notes (Signed)
Patient in MRI, upon return will attempt lab draw.

## 2012-12-04 NOTE — ED Notes (Signed)
Patient transported to CT 

## 2012-12-04 NOTE — Progress Notes (Signed)
Admit date: 12/04/2012 Referring Physician  Dr. Bernette Mayers Primary Physician Aura Dials, MD Primary Cardiologist  Dr. 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 She has been compliant with her current medications including anticoagulation with Xarelto. Earlier this morning she began to have difficulty with her speech seemed to come and go and currently she is mildly dysarthric with a subtle slur to her speech. She understands communication. She is quite active appearing. No orthopnea, no PND, no shortness of breath. Interestingly, on 11/27/12, she underwent an echocardiogram which demonstrated an ejection fraction of 15%. Dr. Katrinka Blazing, her cardiologist, was going to confirm with [facility] later this week.   She mentioned mild fleeting chest pain which lasted 20 seconds, left-sided with no radiation and no other associated symptoms. Troponin was drawn in the emergency department and was mildly abnormal at 0.52. EKG, personally viewed demonstrates sinus rhythm with nonspecific ST-T wave changes, poor R-wave progression, possible old anterior infarct pattern as well as left axis deviation. Prior EKG in the office setting showed left anterior fascicular block. She is currently chest pain-free. Her friend is at bedside.    PMH:   Past Medical History  Diagnosis Date  . Arthritis   . Acute venous embolism and thrombosis of unspecified deep vessels of lower extremity   . Bronchitis     several times  . Abdominal or pelvic swelling, mass or lump, unspecified site   . Phlebitis and thrombophlebitis of unspecified site   . Pelvic mass   . Skin cancer   . Ovarian cancer   . Anxiety   . GERD  (gastroesophageal reflux disease)   . Anemia   . PONV (postoperative nausea and vomiting)     "when fentanyl is used, will break out in itchy rast"    PSH:   Past Surgical History  Procedure Laterality Date  . Appendectomy  1966    ruptured, hospital for 3 weeks  . Rhinoplasty  1976  . Vein ligation and stripping  1998    right leg  . Mohs surgery  2004, 2005    basal cell of the face  . Tooth extraction    . Laparotomy N/A 09/10/2012    Procedure: EXPLORATORY LAPAROTOMY, BILATERAL SALPINGO OOPHORECTOMY, TUMOR DEBULKING;  Surgeon: Rejeana Brock A. Duard Brady, MD;  Location: [facility];  Service: Gynecology;  Laterality: N/A;  . Exploratory laparotomy  09/10/12    Lysis of adhesions, BSO, suboptimal tumor debulking   Allergies:  Septra; Ampicillin; Fentanyl and related; and Neosporin Prior to Admit Meds:   (Not in a hospital admission) Prior to Admission medications   Medication Sig Start Date End Date Taking? Authorizing Provider  aspirin EC 81 MG tablet Take 81 mg by mouth daily.   Yes Historical Provider, MD  hyoscyamine ([name], ANASPAZ) 0.125 MG tablet Take 1 tablet (0.125 mg total) by mouth daily. As needed for bowel symptoms. 09/24/12  Yes Lennis Buzzy Han, MD  LORazepam (ATIVAN) 0.5 MG tablet Take 0.5 mg by mouth every 8 (eight) hours as needed for anxiety (takes after Chemo treatment).    Yes Historical Provider, MD  Multiple Vitamin (MULTIVITAMIN WITH MINERALS) TABS Take 1 tablet by mouth daily.   Yes Historical Provider, MD  pantoprazole (PROTONIX) 40 MG tablet Take 1 tablet (40  mg total) by mouth daily. 08/20/12  Yes Lennis Buzzy Han, MD  Rivaroxaban (XARELTO) 20 MG TABS Take 1 tablet (20 mg total) by mouth every morning. 10/15/12  Yes Lennis Buzzy Han, MD  senna (SENOKOT) 8.6 MG tablet Take 2 tablets by mouth daily as needed for constipation (after Chemo Treatment).   Yes Historical Provider, MD  sucralfate (CARAFATE) 1 G tablet Take 1 gram before meals and at bedtime as needed for GERD  symptoms 09/24/12  Yes Lennis Buzzy Han, MD   Fam HX:    Family History  Problem Relation Age of Onset  . Pneumonia Mother   . Heart attack Father    Social HX:    History   Social History  . Marital Status: Single    Spouse Name: N/A    Number of Children: N/A  . Years of Education: N/A   Occupational History  . Not on file.   Social History Main Topics  . Smoking status: Former Smoker -- 30 years    Types: Cigarettes    Quit date: 04/20/2012  . Smokeless tobacco: Never Used     Comment: quit for a couple years then started back  . Alcohol Use: No  . Drug Use: No  . Sexual Activity: No   Other Topics Concern  . Not on file   Social History Narrative  . No narrative on file     ROS: Denies syncope, shortness of breath, orthopnea, PND. Fleeting chest pain noted as above, atypical. Posterior calf erythema noted. Dysarthria. No associated motor weakness. All 11 ROS were addressed and are negative except what is stated in the HPI  Physical Exam: Blood pressure 130/52, pulse 102, temperature 98.1 F (36.7 C), temperature source Oral, resp. rate 20, SpO2 97.00%.    General: Well developed, well nourished, in no acute distress Head: Eyes PERRLA, No xanthomas.   Normal cephalic and atramatic  Lungs:   Clear bilaterally to auscultation and percussion. Normal respiratory effort. No wheezes, no rales. Heart:   HRRR S1 S2 Pulses are 2+ & equal. I do not appreciate S3 gallop. No murmur.  No carotid bruit. No JVD.  No abdominal bruits. No femoral bruits. Abdomen: Bowel sounds are positive, abdomen soft and non-tender without masses. No hepatosplenomegaly. Msk:  Back normal. Normal strength and tone for age. Extremities:   No clubbing, cyanosis or edema.  DP +1, varicose veins noted. Mild erythematous patch noted on cath. Neuro: Alert and oriented X 3, dysarthria, MAE x 4 GU: Deferred Rectal: Deferred Psych:  Good affect, responds appropriately    Labs:   Lab Results   Component Value Date   WBC 5.4 12/04/2012   HGB 11.2* 12/04/2012   HCT 34.0* 12/04/2012   MCV 90.2 12/04/2012   PLT 120* 12/04/2012    Recent Labs Lab 12/04/12 1220  NA 135  K 3.8  CL 102  CO2 26  BUN 15  CREATININE 0.59  CALCIUM 9.4  PROT 6.7  BILITOT 0.5  ALKPHOS 108  ALT 18  AST 23  GLUCOSE 107*   No results found for this basename: PTT   Lab Results  Component Value Date   INR 2.38* 12/04/2012   INR 0.97 09/04/2012   Lab Results  Component Value Date   TROPONINI 0.52* 12/04/2012        Radiology:  Ct Head Wo Contrast  12/04/2012   *RADIOLOGY REPORT*  Clinical Data: Dysarthria and dysphagia; ovarian carcinoma  CT HEAD WITHOUT CONTRAST  Technique:  Contiguous axial  images were obtained from the base of the skull through the vertex without contrast. Study was obtained within 24 hours of patient arrival at the emergency department.  Comparison: None.  Findings: There is moderate generalized ventricular enlargement. Sulci appear normal.  There is no mass, hemorrhage, extra-axial fluid collection, or midline shift.  No gray-white compartment lesions are identified. No acute infarct is appreciated on this study.  Bony calvarium appears intact.  The mastoid air cells are clear.  IMPRESSION: Ventricular enlargement without corresponding sulcal prominence. 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  No mass, hemorrhage, or acute appearing infarct.  No gray-white compartment lesions are identified.   Original Report Authenticated By: Bretta Bang, M.D.    EKG:  As described above. Personally viewed.   Echocardiogram: 11/27/12 - Left ventricle: The cavity size was moderately dilated. Wall thickness was normal. The estimated ejection fraction was 15%. Diffuse hypokinesis. Doppler parameters are consistent with abnormal left ventricular relaxation (grade 1 diastolic dysfunction). - Aortic valve: There was no stenosis. - Mitral valve: Mild regurgitation. -  Left atrium: The atrium was mildly to moderately dilated. - Right ventricle: The cavity size was normal. Systolic function was normal. - Pulmonary arteries: No complete TR doppler jet so unable to estimate PA systolic pressure. - Inferior vena cava: The vessel was normal in size; the respirophasic diameter changes were in the normal range (= 50%); findings are consistent with normal central venous pressure. Impressions:  - Moderately dilated LV with severe global hypokinesis, EF 15%. Only mild diastolic dysfunction. The RV was normal in size and systolic function. Mild MR.   ASSESSMENT/PLAN:   67 year old female with acute stroke, likely left brain with associated dysarthria with advanced age ovarian cancer as well as newly discovered cardiomyopathy, EF 15% on 11/27/12 with mildly elevated troponin.  1. Elevated troponin-0.52. Continue to track. It is plausible that troponin can be mildly elevated in the setting of acute stroke. Also affecting troponin is cardiomyopathy although she does not appear to be decompensated at all. Truly NYHA class I according to history.  I would continue with Xarelto, anticoagulation, as long as she obtained feels as though this is safe. She does have IVC filter in place. Pulmonary embolism seems less likely with both anticoagulation as well as IVC filter and chest pain was fleeting, atypical. No workup at this time with cardiac catheterization or other invasive procedure.  2. Cardiomyopathy - apparently recently discovered. I do not believe that she has been on cardiotoxic agents in regards to chemotherapy. I will check TSH. If blood pressure is stable, one may consider addition of low-dose beta blocker/ACE inhibitor. I will not initiate these currently even her recent stroke and chance for hypotension/decreased cerebral perfusion pressure. I have personally viewed echocardiogram in the ejection fraction is in fact 15%.  We will follow along with you. I will  notify Dr. Katrinka Blazing.  Donato Schultz, MD  12/04/2012  3:46 PM

## 2012-12-04 NOTE — ED Notes (Signed)
Pt states she started a new chemo drug on Mon for ovarian cancer and now is having difficulty with speech. Denies pain just difficulty speaking.

## 2012-12-04 NOTE — Telephone Encounter (Signed)
Monique [name] is experiencing slurred speech which is audible in this conversation.  She is having difficulty swallowing.  Told Ms. [name] that she needs go to the [facility] ED.  She has friends coming to the house.  Stayed on the phone with patient until friends arrived.  [name] was able to take her Xarelto and 2 baby ASA at 1020.

## 2012-12-04 NOTE — Progress Notes (Signed)
Utilization Review completed.  Romanita Fager RN CM  

## 2012-12-04 NOTE — H&P (Signed)
[facility] History and Physical  Monique [name] ZOX:[id number] DOB: 1945-12-11 DOA: 12/04/2012  Referring physician: Dr. Bernette Mayers PCP: Aura Dials, MD  Specialists: Cardiology Deboraha Sprang), Neurology  Chief Complaint: slurred speech.  HPI: Monique [name] is a 67 y.o. female has a past medical history significant for ovarian cancer followed by Dr. Karel Jarvis, currently undergoing chemotherapy with gemcitabine that was started 4 days ago (disease was found to be advancing under taxo/carboplatin), recent diagnoses of systolic heart failure with an ejection fraction of 15% last week, presents to the emergency room with a chief complaint of slurred speech upon waking up this morning. Patient denies any lightheadedness or dizziness, denies any visual changes, denies any weakness in her arms or legs, denies any numbness or tingling in her fingers or toes (has some numbness in fingers 3,4 and 5 on the left arm, felt to be due to previous chemotherapy). She has a history of extensive deep and thrombosis, currently anticoagulated on Xarelto. Patient denies any shortness of breath. This morning, she also noticed a 20 second long fleeting left-sided chest pain. In the emergency room, except for the slurred speech, patient is asymptomatic. Initial workup showed an elevated troponin of 0.5, mild thrombocytopenia with platelets of 120 and mild anemia with a hemoglobin of 11.2. Cardiology, as well as neurology has been consulted by the ED physician; hospitalist service was asked for admission.   Review of Systems: as per HPI otherwise negative  Past Medical History  Diagnosis Date  . Arthritis   . Acute venous embolism and thrombosis of unspecified deep vessels of lower extremity   . Bronchitis     several times  . Abdominal or pelvic swelling, mass or lump, unspecified site   . Phlebitis and thrombophlebitis of unspecified site   . Pelvic mass   . Skin cancer   . Ovarian cancer   . Anxiety   . GERD  (gastroesophageal reflux disease)   . Anemia   . PONV (postoperative nausea and vomiting)     "when fentanyl is used, will break out in itchy rast"   Past Surgical History  Procedure Laterality Date  . Appendectomy  1966    ruptured, hospital for 3 weeks  . Rhinoplasty  1976  . Vein ligation and stripping  1998    right leg  . Mohs surgery  2004, 2005    basal cell of the face  . Tooth extraction    . Laparotomy N/A 09/10/2012    Procedure: EXPLORATORY LAPAROTOMY, BILATERAL SALPINGO OOPHORECTOMY, TUMOR DEBULKING;  Surgeon: Rejeana Brock A. Duard Brady, MD;  Location: [facility];  Service: Gynecology;  Laterality: N/A;  . Exploratory laparotomy  09/10/12    Lysis of adhesions, BSO, suboptimal tumor debulking   Social History:  reports that she quit smoking about 7 months ago. Her smoking use included Cigarettes. She smoked 0.00 packs per day for 30 years. She has never used smokeless tobacco. She reports that she does not drink alcohol or use illicit drugs.  Allergies  Allergen Reactions  . Septra [Sulfamethoxazole-Tmp Ds] Rash and Other (See Comments)    High fever, Burning skin  . Ampicillin Itching  . Fentanyl And Related Hives    Per Dr. Acey Lav, pt reports she is ok with fentanyl (09/10/12). Patient states it was epidural form of Fentanyl that she reacted to.   . Neosporin [Neomycin-Bacitracin Zn-Polymyx] Swelling and Other (See Comments)    opthalmic solution only can use topically. Swelling and inflamed eyes    Family History  Problem Relation  Age of Onset  . Pneumonia Mother   . Heart attack Father    Prior to Admission medications   Medication Sig Start Date End Date Taking? Authorizing Provider  aspirin EC 81 MG tablet Take 81 mg by mouth daily.   Yes Historical Provider, MD  hyoscyamine ([name], ANASPAZ) 0.125 MG tablet Take 1 tablet (0.125 mg total) by mouth daily. As needed for bowel symptoms. 09/24/12  Yes Lennis Buzzy Han, MD  LORazepam (ATIVAN) 0.5 MG tablet Take 0.5 mg by mouth  every 8 (eight) hours as needed for anxiety (takes after Chemo treatment).    Yes Historical Provider, MD  Multiple Vitamin (MULTIVITAMIN WITH MINERALS) TABS Take 1 tablet by mouth daily.   Yes Historical Provider, MD  pantoprazole (PROTONIX) 40 MG tablet Take 1 tablet (40 mg total) by mouth daily. 08/20/12  Yes Lennis Buzzy Han, MD  Rivaroxaban (XARELTO) 20 MG TABS Take 1 tablet (20 mg total) by mouth every morning. 10/15/12  Yes Lennis Buzzy Han, MD  senna (SENOKOT) 8.6 MG tablet Take 2 tablets by mouth daily as needed for constipation (after Chemo Treatment).   Yes Historical Provider, MD  sucralfate (CARAFATE) 1 G tablet Take 1 gram before meals and at bedtime as needed for GERD symptoms 09/24/12  Yes Lennis Buzzy Han, MD   Physical Exam: Filed Vitals:   12/04/12 1208 12/04/12 1215  BP: 130/52   Pulse: 102   Temp: 98 F (36.7 C) 98.1 F (36.7 C)  TempSrc: Oral   Resp: 20   SpO2: 97%      General:  No apparent distress  Eyes: PERRL, EOMI, no scleral icterus  ENT: moist oropharynx  Neck: supple, no JVD  Cardiovascular: regular rate without MRG; 2+ peripheral pulses  Respiratory: CTA biL, good air movement without wheezing, rhonchi or crackled  Abdomen: soft, non tender to palpation, positive bowel sounds, no guarding, no rebound  Skin: no rashes  Musculoskeletal: no peripheral edema  Psychiatric: normal mood and affect  Neurologic: Cranial nerves grossly intact, mildly dysarthric, strength 5 out of 5 in all 4 extremities.  Labs on Admission:  Basic Metabolic Panel:  Recent Labs Lab 11/28/12 1020 12/04/12 1220  NA 143 135  K 4.5 3.8  CL  --  102  CO2 27 26  GLUCOSE 84 107*  BUN 15.0 15  CREATININE 0.7 0.59  CALCIUM 9.8 9.4   Liver Function Tests:  Recent Labs Lab 12/04/12 1220  AST 23  ALT 18  ALKPHOS 108  BILITOT 0.5  PROT 6.7  ALBUMIN 3.5   CBC:  Recent Labs Lab 11/28/12 1019 12/02/12 0843 12/04/12 1220  WBC 5.0 6.1 5.4  NEUTROABS 3.3 4.0  4.6  HGB 11.5* 11.5* 11.2*  HCT 34.8 35.9 34.0*  MCV 93.2 92.3 90.2  PLT 176 138* 120*   Cardiac Enzymes:  Recent Labs Lab 12/04/12 1220  TROPONINI 0.52*   Radiological Exams on Admission: Ct Head Wo Contrast  12/04/2012   *RADIOLOGY REPORT*  Clinical Data: Dysarthria and dysphagia; ovarian carcinoma  CT HEAD WITHOUT CONTRAST  Technique:  Contiguous axial images were obtained from the base of the skull through the vertex without contrast. Study was obtained within 24 hours of patient arrival at the emergency department.  Comparison: None.  Findings: There is moderate generalized ventricular enlargement. Sulci appear normal.  There is no mass, hemorrhage, extra-axial fluid collection, or midline shift.  No gray-white compartment lesions are identified. No acute infarct is appreciated on this study.  Bony calvarium appears intact.  The mastoid air cells are clear.  IMPRESSION: Ventricular enlargement without corresponding sulcal prominence. 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  No mass, hemorrhage, or acute appearing infarct.  No gray-white compartment lesions are identified.   Original Report Authenticated By: Bretta Bang, M.D.    EKG: Independently reviewed. Non specific ST changes.  Assessment/Plan Active Problems:   Ovarian cancer   Personal history of venous thrombosis and embolism   CVA (cerebral infarction)   Elevated troponin   Systolic heart failure  Dysarthria - Concerning for a CVA. Neurology has been consulted, appreciate input. Patient does have an echocardiogram last week, we'll hold on repeating that study. MRI pending.  Elevated troponin - Patient has very low ejection fraction according to the echo last week. She appears clinically euvolemic at this point, well compensated. Appreciate cardiology input. Admit to telemetry, cycle troponins.  Ovarian cancer - followed as an outpatient, seems to be advancing and recently changed  chemotherapy.  Systolic congestive heart failure - Appreciate cardiology input. Careful with fluids.  History of DVT - Continue Xarelto  DVT prophylaxis - on anticoagulation  Code Status: Presumed full  Family Communication: family bedside  Disposition Plan: inpatient  Time spent: 31  Costin M. Elvera Lennox, MD [facility] Pager [phone]  If 7PM-7AM, please contact night-coverage [email or web address] Password [email or web address] 12/04/2012, 2:31 PM

## 2012-12-04 NOTE — Progress Notes (Signed)
CRITICAL VALUE ALERT  Critical value received:  Troponin 0.95  Date of notification:  8/13  Time of notification:  1745  Critical value read back: yes  Nurse who received alert:  M. Virl Axe  MD notified (1st page):  Gherghe  Time of first page:  1748  MD notified (2nd page):  Time of second page:  Responding MD:  Elvera Lennox  Time MD responded:  [phone]

## 2012-12-04 NOTE — Progress Notes (Signed)
*  PRELIMINARY RESULTS* Vascular Ultrasound Carotid Duplex (Doppler) has been completed.  Preliminary findings: Bilateral:  1-39% ICA stenosis.  Vertebral artery flow is antegrade.      Farrel Demark, RDMS, RVT  12/04/2012, 3:29 PM

## 2012-12-04 NOTE — ED Notes (Signed)
Patient transported to MRI 

## 2012-12-04 NOTE — Consult Note (Addendum)
NEURO HOSPITALIST CONSULT NOTE    Reason for Consult: dysarthria.   HPI:                                                                                                                                          Monique [name] is an 67 y.o. female with a past medical history significant for ovarian cancer currently undergoing chemotherapy with gemcitabine, newly diagnosed systolic heart failure with EF 15%,  chronic anticoagulation on xarelto due to bilateral DVT s/p IVC filter placement, admitted to [facility] with new onset dysarthria. She stated taht she went to bed last night feeling well, but approximately 1 hour after waking up this morning noticed that her speech was slurred and she couldn't talk properly. Never had similar symptoms before. She denies associated headache, vertigo, double vision, difficulty swallowing, confusion, or unsteadiness. She presented to [facility] ED for further evaluation and management but was out of the window for intravenous thrombolysis. CT brain upon arrival to ED showed no acute abnormality, but a subsequent MRI-DWI disclosed scattered. small acute non hemorrhagic infarcts involving portions of the left sub insular region, left frontal lobe, left parietal lobe, occipital lobe bilaterally and cerebellum bilaterally. She is on xarelto. At this moment, she complains of slurred speech but has no other neurological complains.    Past Medical History  Diagnosis Date  . Arthritis   . Acute venous embolism and thrombosis of unspecified deep vessels of lower extremity   . Bronchitis     several times  . Abdominal or pelvic swelling, mass or lump, unspecified site   . Phlebitis and thrombophlebitis of unspecified site   . Pelvic mass   . Skin cancer   . Ovarian cancer   . Anxiety   . GERD (gastroesophageal reflux disease)   . Anemia   . PONV (postoperative nausea and vomiting)     "when fentanyl is used, will break out in itchy rast"    Past Surgical  History  Procedure Laterality Date  . Appendectomy  1966    ruptured, hospital for 3 weeks  . Rhinoplasty  1976  . Vein ligation and stripping  1998    right leg  . Mohs surgery  2004, 2005    basal cell of the face  . Tooth extraction    . Laparotomy N/A 09/10/2012    Procedure: EXPLORATORY LAPAROTOMY, BILATERAL SALPINGO OOPHORECTOMY, TUMOR DEBULKING;  Surgeon: Rejeana Brock A. Duard Brady, MD;  Location: [facility];  Service: Gynecology;  Laterality: N/A;  . Exploratory laparotomy  09/10/12    Lysis of adhesions, BSO, suboptimal tumor debulking    Family History  Problem Relation Age of Onset  . Pneumonia Mother   . Heart attack Father     Social History:  reports that she quit smoking  about 7 months ago. Her smoking use included Cigarettes. She smoked 0.00 packs per day for 30 years. She has never used smokeless tobacco. She reports that she does not drink alcohol or use illicit drugs.  Allergies  Allergen Reactions  . Septra [Sulfamethoxazole-Tmp Ds] Rash and Other (See Comments)    High fever, Burning skin  . Ampicillin Itching  . Fentanyl And Related Hives    Per Dr. Acey Lav, pt reports she is ok with fentanyl (09/10/12). Patient states it was epidural form of Fentanyl that she reacted to.   . Neosporin [Neomycin-Bacitracin Zn-Polymyx] Swelling and Other (See Comments)    opthalmic solution only can use topically. Swelling and inflamed eyes    MEDICATIONS:                                                                                                                     I have reviewed the patient's current medications.   ROS:                                                                                                                                       History obtained from the patient and chart review.  General ROS: negative for - chills, fatigue, fever, night sweats, weight gain or weight loss Psychological ROS: negative for - behavioral disorder, hallucinations, memory  difficulties, mood swings or suicidal ideation Ophthalmic ROS: negative for - blurry vision, double vision, eye pain or loss of vision ENT ROS: negative for - epistaxis, nasal discharge, oral lesions, sore throat, tinnitus or vertigo Allergy and Immunology ROS: negative for - hives or itchy/watery eyes Hematological and Lymphatic ROS: negative for - bleeding problems, bruising or swollen lymph nodes Endocrine ROS: negative for - galactorrhea, hair pattern changes, polydipsia/polyuria or temperature intolerance Respiratory ROS: negative for - cough, hemoptysis, shortness of breath or wheezing Cardiovascular ROS: negative for - chest pain, dyspnea on exertion, edema or irregular heartbeat Gastrointestinal ROS: negative for - abdominal pain, diarrhea, hematemesis, nausea/vomiting or stool incontinence Genito-Urinary ROS: negative for - dysuria, hematuria, incontinence or urinary frequency/urgency Musculoskeletal ROS: negative for - joint swelling or muscular weakness Neurological ROS: as noted in HPI Dermatological ROS: negative for rash and skin lesion changes      Physical exam: pleasant female in no apparent distress. Head: normocephalic. Neck: supple, no bruits, no JVD. Cardiac: no murmurs. Lungs: clear. Abdomen: soft, no tender, no mass. Extremities: no edema.  Blood pressure 118/65, pulse 93, temperature 98.7 F (37.1 C), temperature source Oral, resp. rate 20, height 5\' 7"  (1.702 m), weight 67.132 kg (148 lb), SpO2 100.00%.   Neurologic Examination:                                                                                                      Mental Status: Alert, awake, oriented x 4, thought content appropriate. Moderate dysarthria but no dysphasia. Able to follow 3 step commands without difficulty. Cranial Nerves: II: Discs flat bilaterally; Visual fields grossly normal, pupils equal, round, reactive to light and accommodation III,IV, VI: ptosis not present,  extra-ocular motions intact bilaterally V,VII: smile symmetric, facial light touch sensation normal bilaterally VIII: hearing normal bilaterally IX,X: gag reflex present XI: bilateral shoulder shrug XII: midline tongue extension Motor: Right : Upper extremity   5/5    Left:     Upper extremity   5/5  Lower extremity   5/5     Lower extremity   5/5 Tone and bulk:normal tone throughout; no atrophy noted Sensory: Pinprick and light touch intact throughout, bilaterally Deep Tendon Reflexes:  2+ all over  Plantars: Right: downgoing   Left: downgoing Cerebellar: normal finger-to-nose,  normal heel-to-shin test Gait:  No ataxia. CV: pulses palpable throughout    No results found for this basename: cbc, bmp, coags, chol, tri, ldl, hga1c    Results for orders placed during the hospital encounter of 12/04/12 (from the past 48 hour(s))  CBC WITH DIFFERENTIAL     Status: Abnormal   Collection Time    12/04/12 12:20 PM      Result Value Range   WBC 5.4  4.0 - 10.5 K/uL   RBC 3.77 (*) 3.87 - 5.11 MIL/uL   Hemoglobin 11.2 (*) 12.0 - 15.0 g/dL   HCT 96.0 (*) 45.4 - 09.8 %   MCV 90.2  78.0 - 100.0 fL   MCH 29.7  26.0 - 34.0 pg   MCHC 32.9  30.0 - 36.0 g/dL   RDW 11.9 (*) 14.7 - 82.9 %   Platelets 120 (*) 150 - 400 K/uL   Neutrophils Relative % 84 (*) 43 - 77 %   Neutro Abs 4.6  1.7 - 7.7 K/uL   Lymphocytes Relative 9 (*) 12 - 46 %   Lymphs Abs 0.5 (*) 0.7 - 4.0 K/uL   Monocytes Relative 5  3 - 12 %   Monocytes Absolute 0.3  0.1 - 1.0 K/uL   Eosinophils Relative 1  0 - 5 %   Eosinophils Absolute 0.1  0.0 - 0.7 K/uL   Basophils Relative 0  0 - 1 %   Basophils Absolute 0.0  0.0 - 0.1 K/uL  PROTIME-INR     Status: Abnormal   Collection Time    12/04/12 12:20 PM      Result Value Range   Prothrombin Time 25.2 (*) 11.6 - 15.2 seconds   INR 2.38 (*) 0.00 - 1.49  APTT     Status: None   Collection Time    12/04/12 12:20 PM  Result Value Range   aPTT 36  24 - 37 seconds   TROPONIN I     Status: Abnormal   Collection Time    12/04/12 12:20 PM      Result Value Range   Troponin I 0.52 (*) <0.30 ng/mL   Comment:            Due to the release kinetics of cTnI,     a negative result within the first hours     of the onset of symptoms does not rule out     myocardial infarction with certainty.     If myocardial infarction is still suspected,     repeat the test at appropriate intervals.     CRITICAL RESULT CALLED TO, READ BACK BY AND VERIFIED WITH:     [id number]  COMPREHENSIVE METABOLIC PANEL     Status: Abnormal   Collection Time    12/04/12 12:20 PM      Result Value Range   Sodium 135  135 - 145 mEq/L   Potassium 3.8  3.5 - 5.1 mEq/L   Chloride 102  96 - 112 mEq/L   CO2 26  19 - 32 mEq/L   Glucose, Bld 107 (*) 70 - 99 mg/dL   BUN 15  6 - 23 mg/dL   Creatinine, Ser 4.78  0.50 - 1.10 mg/dL   Calcium 9.4  8.4 - 29.5 mg/dL   Total Protein 6.7  6.0 - 8.3 g/dL   Albumin 3.5  3.5 - 5.2 g/dL   AST 23  0 - 37 U/L   ALT 18  0 - 35 U/L   Alkaline Phosphatase 108  39 - 117 U/L   Total Bilirubin 0.5  0.3 - 1.2 mg/dL   GFR calc non Af Amer >90  >90 mL/min   GFR calc Af Amer >90  >90 mL/min   Comment: (NOTE)     The eGFR has been calculated using the CKD EPI equation.     This calculation has not been validated in all clinical situations.     eGFR's persistently <90 mL/min signify possible Chronic Kidney     Disease.  URINALYSIS, ROUTINE W REFLEX MICROSCOPIC     Status: Abnormal   Collection Time    12/04/12  1:18 PM      Result Value Range   Color, Urine YELLOW  YELLOW   APPearance CLEAR  CLEAR   Specific Gravity, Urine 1.022  1.005 - 1.030   pH 6.5  5.0 - 8.0   Glucose, UA NEGATIVE  NEGATIVE mg/dL   Hgb urine dipstick MODERATE (*) NEGATIVE   Bilirubin Urine NEGATIVE  NEGATIVE   Ketones, ur NEGATIVE  NEGATIVE mg/dL   Protein, ur NEGATIVE  NEGATIVE mg/dL   Urobilinogen, UA 0.2  0.0 - 1.0 mg/dL   Nitrite NEGATIVE  NEGATIVE    Leukocytes, UA NEGATIVE  NEGATIVE  URINE MICROSCOPIC-ADD ON     Status: Abnormal   Collection Time    12/04/12  1:18 PM      Result Value Range   Squamous Epithelial / LPF RARE  RARE   WBC, UA 0-2  <3 WBC/hpf   RBC / HPF 3-6  <3 RBC/hpf   Bacteria, UA FEW (*) RARE  TROPONIN I     Status: Abnormal   Collection Time    12/04/12  4:44 PM      Result Value Range   Troponin I 0.95 (*) <0.30 ng/mL   Comment:  Due to the release kinetics of cTnI,     a negative result within the first hours     of the onset of symptoms does not rule out     myocardial infarction with certainty.     If myocardial infarction is still suspected,     repeat the test at appropriate intervals.     CRITICAL RESULT CALLED TO, READ BACK BY AND VERIFIED WITH:     [id number]    Ct Head Wo Contrast  12/04/2012   *RADIOLOGY REPORT*  Clinical Data: Dysarthria and dysphagia; ovarian carcinoma  CT HEAD WITHOUT CONTRAST  Technique:  Contiguous axial images were obtained from the base of the skull through the vertex without contrast. Study was obtained within 24 hours of patient arrival at the emergency department.  Comparison: None.  Findings: There is moderate generalized ventricular enlargement. Sulci appear normal.  There is no mass, hemorrhage, extra-axial fluid collection, or midline shift.  No gray-white compartment lesions are identified. No acute infarct is appreciated on this study.  Bony calvarium appears intact.  The mastoid air cells are clear.  IMPRESSION: Ventricular enlargement without corresponding sulcal prominence. 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  No mass, hemorrhage, or acute appearing infarct.  No gray-white compartment lesions are identified.   Original Report Authenticated By: Bretta Bang, M.D.   Mr Angiogram Head Wo Contrast  12/04/2012   *RADIOLOGY REPORT*  Clinical Data:  Slurred speech. Low cardiac output.  MRI BRAIN WITHOUT CONTRAST MRA  HEAD WITHOUT CONTRAST  Technique: Multiplanar, multiecho pulse sequences of the brain and surrounding structures were obtained according to standard protocol without intravenous contrast.  Angiographic images of the head were obtained using MRA technique without contrast.  Comparison: 12/04/2012 CT.  No comparison MR.  MRI HEAD  Findings:  Scattered acute non hemorrhagic infarcts involving portions of the left sub insular region, left frontal lobe, left parietal lobe, occipital lobe bilaterally and cerebellum bilaterally.  This distribution raises possibility embolic disease.  No intracranial hemorrhage.  Scattered nonspecific white matter type changes may reflect result of small vessel disease.  Atrophy.  Ventricular prominence may be related to atrophy although not able to completely exclude a mild component hydrocephalus.  No intracranial mass lesion detected on this unenhanced exam.  IMPRESSION:  Scattered acute non hemorrhagic infarcts involving portions of the left sub insular region, left frontal lobe, left parietal lobe, occipital lobe bilaterally and cerebellum bilaterally.  This distribution raises possibility embolic disease.  No intracranial hemorrhage.  Scattered nonspecific white matter type changes may reflect result of small vessel disease.  Atrophy.  Ventricular prominence may be related to atrophy although not able to completely exclude a mild component hydrocephalus.  MRA HEAD  Findings: Anterior circulation without medium or large size vessel significant stenosis or occlusion.  Fetal type contribution to the posterior cerebral arteries.  Ectatic vertebral arteries and basilar artery without high-grade stenosis.  Non-visualized right PICA and left AICA with narrowing and irregularity of the left PICA and right AICA.  Slightly bulbous appearance of the basilar tip and superior medial margin right posterior cerebral artery P1 segment without definitive saccular aneurysm.  Duplicated right superior  cerebellar artery.  Anterior and posterior branch vessel irregularity.  IMPRESSION: Branch vessel irregularity. Please see above.  Critical Value/emergent results were called by telephone at the time of interpretation on 12/04/2012 at 4:17 p.m. to Dr. Renae Gloss, who verbally acknowledged these results.   Original Report Authenticated By: Lacy Duverney, M.D.   Mr Brain Wo Contrast  12/04/2012   *RADIOLOGY REPORT*  Clinical Data:  Slurred speech. Low cardiac output.  MRI BRAIN WITHOUT CONTRAST MRA HEAD WITHOUT CONTRAST  Technique: Multiplanar, multiecho pulse sequences of the brain and surrounding structures were obtained according to standard protocol without intravenous contrast.  Angiographic images of the head were obtained using MRA technique without contrast.  Comparison: 12/04/2012 CT.  No comparison MR.  MRI HEAD  Findings:  Scattered acute non hemorrhagic infarcts involving portions of the left sub insular region, left frontal lobe, left parietal lobe, occipital lobe bilaterally and cerebellum bilaterally.  This distribution raises possibility embolic disease.  No intracranial hemorrhage.  Scattered nonspecific white matter type changes may reflect result of small vessel disease.  Atrophy.  Ventricular prominence may be related to atrophy although not able to completely exclude a mild component hydrocephalus.  No intracranial mass lesion detected on this unenhanced exam.  IMPRESSION:  Scattered acute non hemorrhagic infarcts involving portions of the left sub insular region, left frontal lobe, left parietal lobe, occipital lobe bilaterally and cerebellum bilaterally.  This distribution raises possibility embolic disease.  No intracranial hemorrhage.  Scattered nonspecific white matter type changes may reflect result of small vessel disease.  Atrophy.  Ventricular prominence may be related to atrophy although not able to completely exclude a mild component hydrocephalus.  MRA HEAD  Findings: Anterior  circulation without medium or large size vessel significant stenosis or occlusion.  Fetal type contribution to the posterior cerebral arteries.  Ectatic vertebral arteries and basilar artery without high-grade stenosis.  Non-visualized right PICA and left AICA with narrowing and irregularity of the left PICA and right AICA.  Slightly bulbous appearance of the basilar tip and superior medial margin right posterior cerebral artery P1 segment without definitive saccular aneurysm.  Duplicated right superior cerebellar artery.  Anterior and posterior branch vessel irregularity.  IMPRESSION: Branch vessel irregularity. Please see above.  Critical Value/emergent results were called by telephone at the time of interpretation on 12/04/2012 at 4:17 p.m. to Dr. Renae Gloss, who verbally acknowledged these results.   Original Report Authenticated By: Lacy Duverney, M.D.     Assessment/Plan: 67 years old with ovarian cancer, bilateral DVT on chronic anticoagulation, systolic heart failure with EF 15%, admitted with acute onset dysarthria without other associated neurological findings on neuro-exam, and MRI-DWI revealing scattered small areas of acute infarction with a pattern highly consistent with embolism, most likely resulting from hypercoagulable state in the context of ovarian cancer or cardio embolism ( heart failure with EF 15%). Will recommend completing stroke stroke and continuing xarelto. Speech therapy consult. Will follow up.  Wyatt Portela, MD [facility] [phone]  12/04/2012, 6:56 PM

## 2012-12-05 ENCOUNTER — Encounter (HOSPITAL_COMMUNITY): Payer: Self-pay | Admitting: Radiology

## 2012-12-05 ENCOUNTER — Inpatient Hospital Stay (HOSPITAL_COMMUNITY): Payer: Medicare Other

## 2012-12-05 LAB — CBC
HCT: 32.9 % — ABNORMAL LOW (ref 36.0–46.0)
MCH: 29.6 pg (ref 26.0–34.0)
MCHC: 32.8 g/dL (ref 30.0–36.0)
MCV: 90.1 fL (ref 78.0–100.0)
Platelets: 103 10*3/uL — ABNORMAL LOW (ref 150–400)
RDW: 15.8 % — ABNORMAL HIGH (ref 11.5–15.5)
RDW: 15.9 % — ABNORMAL HIGH (ref 11.5–15.5)
WBC: 5 10*3/uL (ref 4.0–10.5)

## 2012-12-05 LAB — GLUCOSE, CAPILLARY: Glucose-Capillary: 114 mg/dL — ABNORMAL HIGH (ref 70–99)

## 2012-12-05 LAB — COMPREHENSIVE METABOLIC PANEL
Alkaline Phosphatase: 111 U/L (ref 39–117)
BUN: 12 mg/dL (ref 6–23)
CO2: 26 mEq/L (ref 19–32)
Chloride: 101 mEq/L (ref 96–112)
GFR calc Af Amer: >90 mL/min (ref >90)
Glucose, Bld: 98 mg/dL (ref 70–99)
Potassium: 4.1 mEq/L (ref 3.5–5.1)
Total Bilirubin: 0.6 mg/dL (ref 0.3–1.2)

## 2012-12-05 LAB — LIPID PANEL
Cholesterol: 148 mg/dL (ref 0–200)
Triglycerides: 89 mg/dL (ref <150)
VLDL: 18 mg/dL (ref 0–40)

## 2012-12-05 LAB — BASIC METABOLIC PANEL
BUN: 13 mg/dL (ref 6–23)
CO2: 26 mEq/L (ref 19–32)
Chloride: 102 mEq/L (ref 96–112)
Creatinine, Ser: 0.6 mg/dL (ref 0.50–1.10)

## 2012-12-05 LAB — MRSA PCR SCREENING: MRSA by PCR: NEGATIVE

## 2012-12-05 LAB — PRO B NATRIURETIC PEPTIDE: Pro B Natriuretic peptide (BNP): 1659 pg/mL — ABNORMAL HIGH (ref 0–125)

## 2012-12-05 LAB — APTT: aPTT: 31 seconds (ref 24–37)

## 2012-12-05 MED ORDER — RIVAROXABAN 20 MG PO TABS
20.0000 mg | ORAL_TABLET | Freq: Every day | ORAL | Status: DC
  Filled 2012-12-05: qty 1

## 2012-12-05 MED ORDER — SODIUM CHLORIDE 0.9 % IV SOLN
INTRAVENOUS | Status: DC
  Administered 2012-12-05: 14:00:00 via INTRAVENOUS
  Administered 2012-12-06: 1000 mL via INTRAVENOUS
  Administered 2012-12-07: 950 mL via INTRAVENOUS
  Administered 2012-12-07 – 2012-12-08 (×2): via INTRAVENOUS

## 2012-12-05 MED ORDER — HEPARIN (PORCINE) IN NACL 100-0.45 UNIT/ML-% IJ SOLN
1150.0000 [IU]/h | INTRAMUSCULAR | Status: DC
  Administered 2012-12-05: 800 [IU]/h via INTRAVENOUS
  Administered 2012-12-06: 1000 [IU]/h via INTRAVENOUS
  Filled 2012-12-05 (×2): qty 250

## 2012-12-05 MED ORDER — RIVAROXABAN 20 MG PO TABS: 20.0000 mg | ORAL_TABLET | Freq: Every day | ORAL | Status: DC

## 2012-12-05 MED ORDER — ACETAMINOPHEN 650 MG RE SUPP: 650.0000 mg | RECTAL | Status: DC | PRN

## 2012-12-05 MED ORDER — ACETAMINOPHEN 325 MG PO TABS: 650.0000 mg | ORAL_TABLET | ORAL | Status: DC | PRN

## 2012-12-05 MED ORDER — LABETALOL HCL 5 MG/ML IV SOLN: 10.0000 mg | INTRAVENOUS | Status: DC | PRN

## 2012-12-05 NOTE — Evaluation (Signed)
Clinical/Bedside Swallow Evaluation Patient Details  Name: Monique [name] MRN: [id number] Date of Birth: 18-Mar-1946  Today's Date: 12/05/2012 Time: 1210-1230 SLP Time Calculation (min): 20 min  Past Medical History:  Past Medical History  Diagnosis Date  . Arthritis   . Acute venous embolism and thrombosis of unspecified deep vessels of lower extremity   . Bronchitis     several times  . Abdominal or pelvic swelling, mass or lump, unspecified site   . Phlebitis and thrombophlebitis of unspecified site   . Pelvic mass   . Skin cancer   . Ovarian cancer   . Anxiety   . GERD (gastroesophageal reflux disease)   . Anemia   . PONV (postoperative nausea and vomiting)     "when fentanyl is used, will break out in itchy rast"   Past Surgical History:  Past Surgical History  Procedure Laterality Date  . Appendectomy  1966    ruptured, hospital for 3 weeks  . Rhinoplasty  1976  . Vein ligation and stripping  1998    right leg  . Mohs surgery  2004, 2005    basal cell of the face  . Tooth extraction    . Laparotomy N/A 09/10/2012    Procedure: EXPLORATORY LAPAROTOMY, BILATERAL SALPINGO OOPHORECTOMY, TUMOR DEBULKING;  Surgeon: Rejeana Brock A. Duard Brady, MD;  Location: [facility];  Service: Gynecology;  Laterality: N/A;  . Exploratory laparotomy  09/10/12    Lysis of adhesions, BSO, suboptimal tumor debulking   HPI:  Monique [name] is an 67 y.o. female  with a past medical history significant for ovarian cancer currently undergoing chemotherapy with gemcitabine, newly diagnosed systolic heart failure with EF 15%, chronic anticoagulation on xarelto due to bilateral DVT s/p IVC filter placement, admitted to [facility] with new onset dysarthria. She stated taht she went to bed previous night feeling well, but approximately 1 hour after waking up this morning noticed that her speech was slurred and she couldn't talk properly. Never had similar symptoms before.     Assessment / Plan / Recommendation Clinical  Impression  Pt did not demonstrate overt s/s of aspiration at bedside. Pt did demonstrate labial weakness on R side and mild stasis of solid food on R side of tongue. Pt educated on doing lingual sweep to clear stasis. Pt also demonstrating increased communication difficulties that she reports began this morning; now unable to speak but can write. Due to this new symptom, pt may be experiencing further complications with neurological issues which may affect swallow function/ risk of aspiration in the future. Rx regular diet and thin liquids currently and will continue to follow to assess diet tolerance and speech/language.    Aspiration Risk  Mild    Diet Recommendation Regular;Thin liquid   Liquid Administration via: Cup;Straw Medication Administration: Whole meds with liquid Supervision: Patient able to self feed;Intermittent supervision to cue for compensatory strategies Compensations: Slow rate;Check for pocketing Postural Changes and/or Swallow Maneuvers: Seated upright 90 degrees    Other  Recommendations Oral Care Recommendations: Oral care BID   Follow Up Recommendations       Frequency and Duration min 2x/week  2 weeks   Pertinent Vitals/Pain n/a    SLP Swallow Goals Patient will utilize recommended strategies during swallow to increase swallowing safety with: Minimal assistance   Swallow Study Prior Functional Status       General HPI: Monique [name] is an 67 y.o. female  with a past medical history significant for ovarian cancer currently undergoing chemotherapy with  gemcitabine, newly diagnosed systolic heart failure with EF 15%, chronic anticoagulation on xarelto due to bilateral DVT s/p IVC filter placement, admitted to [facility] with new onset dysarthria. She stated taht she went to bed previous night feeling well, but approximately 1 hour after waking up this morning noticed that her speech was slurred and she couldn't talk properly. Never had similar symptoms before.   Type  of Study: Bedside swallow evaluation Diet Prior to this Study: NPO Temperature Spikes Noted: No Respiratory Status: Room air Behavior/Cognition: Alert;Cooperative (new aphasia; could not verbalize but could write) Oral Cavity - Dentition: Adequate natural dentition Self-Feeding Abilities: Able to feed self Patient Positioning: Upright in bed Baseline Vocal Quality: Clear Volitional Swallow: Able to elicit    Oral/Motor/Sensory Function Overall Oral Motor/Sensory Function: Impaired Labial ROM: Reduced right Labial Symmetry: Abnormal symmetry right Labial Strength: Reduced (R side) Lingual ROM: Within Functional Limits Lingual Symmetry: Within Functional Limits   Ice Chips Ice chips: Not tested   Thin Liquid Thin Liquid: Within functional limits Presentation: Cup;Straw    Nectar Thick Nectar Thick Liquid: Not tested   Honey Thick Honey Thick Liquid: Not tested   Puree Puree: Within functional limits   Solid   GO    Solid: Impaired Presentation: Self Fed Oral Phase Functional Implications: Oral residue (on R side of tongue)       Oleksiak, Amy K, MA, CFY-SLP 12/05/2012,1:33 PM

## 2012-12-05 NOTE — Progress Notes (Addendum)
[facility] PROGRESS NOTE   BRADLEY HANDYSIDE AVW:[id number] DOB: 11/03/1945 DOA: 12/04/2012 PCP: Aura Dials, MD   HPI: Monique [name] is a 67 y.o. female has a past medical history significant for ovarian cancer followed by Dr. Karel Jarvis, currently undergoing chemotherapy with gemcitabine admitted last night with dysarthria.   Assessment/Plan:  Early this morning patient unable to speak entirely and with right facial droop. Stat CT head showed hemorrhagic conversion of her CVAs. Case discussed with Dr. Cyril Mourning and will transfer patient to [facility] neuro ICU.  - discontinued Xarelto Systolic heart failure - EF 15%, patient suprisingly euvolemic given 2D echo findings. Cardiology following.    Code Status: presumed full Family Communication: friend bedside  Disposition Plan: transfer to neuro ICU at [facility]   Consultants:  Neurology  Cardiology  Procedures:  none  Anti-infectives   None     Antibiotics Given (last 72 hours)   None     HPI/Subjective: - denies pain, frustrated she can't talk  Objective: Filed Vitals:   12/04/12 1718 12/04/12 2135 12/05/12 0623 12/05/12 0836  BP: 118/65 91/77 109/67 110/54  Pulse: 93 86 84 83  Temp: 98.7 F (37.1 C) 99.7 F (37.6 C) 99.4 F (37.4 C) 98.2 F (36.8 C)  TempSrc: Oral Oral Oral Oral  Resp: 20 19 18 18   Height: 5\' 7"  (1.702 m)     Weight: 67.132 kg (148 lb)  66.769 kg (147 lb 3.2 oz)   SpO2: 100% 99% 99% 97%    Intake/Output Summary (Last 24 hours) at 12/05/12 0856 Last data filed at 12/05/12 0834  Gross per 24 hour  Intake    120 ml  Output    600 ml  Net   -480 ml   Filed Weights   12/04/12 1718 12/05/12 0623  Weight: 67.132 kg (148 lb) 66.769 kg (147 lb 3.2 oz)    Exam:  General:  NAD  Cardiovascular: regular rate and rhythm, without MRG  Respiratory: good air movement, clear to auscultation throughout, no wheezing, ronchi or rales  Abdomen: soft, not tender to palpation, positive bowel  sounds  MSK: no peripheral edema  Neuro: right facial asymmetry, unable to talk  Data Reviewed: Basic Metabolic Panel:  Recent Labs Lab 11/28/12 1020 12/04/12 1220 12/05/12 0455  NA 143 135 134*  K 4.5 3.8 3.8  CL  --  102 102  CO2 27 26 26   GLUCOSE 84 107* 100*  BUN 15.0 15 13  CREATININE 0.7 0.59 0.60  CALCIUM 9.8 9.4 9.2   Liver Function Tests:  Recent Labs Lab 12/04/12 1220  AST 23  ALT 18  ALKPHOS 108  BILITOT 0.5  PROT 6.7  ALBUMIN 3.5   CBC:  Recent Labs Lab 11/28/12 1019 12/02/12 0843 12/04/12 1220 12/05/12 0455  WBC 5.0 6.1 5.4 5.2  NEUTROABS 3.3 4.0 4.6  --   HGB 11.5* 11.5* 11.2* 10.8*  HCT 34.8 35.9 34.0* 32.9*  MCV 93.2 92.3 90.2 90.1  PLT 176 138* 120* 108*   Cardiac Enzymes:  Recent Labs Lab 12/04/12 1220 12/04/12 1644 12/04/12 2240 12/05/12 0455  TROPONINI 0.52* 0.95* 1.00* 1.11*   CBG:  Recent Labs Lab 12/04/12 2134  GLUCAP 90   Studies: Ct Head Wo Contrast  12/05/2012   *RADIOLOGY REPORT*  Clinical Data: Increasing symptoms with right upper extremity weakness and expressive ectasia.  CT HEAD WITHOUT CONTRAST  Technique:  Contiguous axial images were obtained from the base of the skull through the vertex without contrast.  Comparison: 12/04/2012  MR and CT.  Findings: Scattered infarcts supratentorially (greater on the left) and within the cerebellum noted but better visualized on recent MR. It is difficult by CT examination to determine if there has been extension of these infarcts.  Hemorrhage left occipital lobe suggests hemorrhagic transformation of infarct in this region.  Ventricular prominence may be related to atrophy although mild hydrocephalus is also a consideration.  No intracranial mass lesion detected on this unenhanced exam.  IMPRESSION: Hemorrhage left occipital lobe suggests hemorrhagic transformation of infarct in this region.  Scattered infarcts supratentorially (greater on the left) and within the cerebellum  noted but better visualized on recent MR. It is difficult by CT examination to determine if there has been extension of these infarcts. MR would prove helpful if this would change the patient's course of therapy.  Ventricular prominence may be related to atrophy although mild hydrocephalus is also a consideration.  Critical Value/emergent results were called by telephone at the time of interpretation on 12/05/2012 at 8:06 a.m. to Dr. Pincus Badder, who verbally acknowledged these results.   Original Report Authenticated By: Lacy Duverney, M.D.   Ct Head Wo Contrast  12/04/2012   *RADIOLOGY REPORT*  Clinical Data: Dysarthria and dysphagia; ovarian carcinoma  CT HEAD WITHOUT CONTRAST  Technique:  Contiguous axial images were obtained from the base of the skull through the vertex without contrast. Study was obtained within 24 hours of patient arrival at the emergency department.  Comparison: None.  Findings: There is moderate generalized ventricular enlargement. Sulci appear normal.  There is no mass, hemorrhage, extra-axial fluid collection, or midline shift.  No gray-white compartment lesions are identified. No acute infarct is appreciated on this study.  Bony calvarium appears intact.  The mastoid air cells are clear.  IMPRESSION: Ventricular enlargement without corresponding sulcal prominence. 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  No mass, hemorrhage, or acute appearing infarct.  No gray-white compartment lesions are identified.   Original Report Authenticated By: Bretta Bang, M.D.   Mr Angiogram Head Wo Contrast  12/04/2012   *RADIOLOGY REPORT*  Clinical Data:  Slurred speech. Low cardiac output.  MRI BRAIN WITHOUT CONTRAST MRA HEAD WITHOUT CONTRAST  Technique: Multiplanar, multiecho pulse sequences of the brain and surrounding structures were obtained according to standard protocol without intravenous contrast.  Angiographic images of the head were obtained using MRA technique  without contrast.  Comparison: 12/04/2012 CT.  No comparison MR.  MRI HEAD  Findings:  Scattered acute non hemorrhagic infarcts involving portions of the left sub insular region, left frontal lobe, left parietal lobe, occipital lobe bilaterally and cerebellum bilaterally.  This distribution raises possibility embolic disease.  No intracranial hemorrhage.  Scattered nonspecific white matter type changes may reflect result of small vessel disease.  Atrophy.  Ventricular prominence may be related to atrophy although not able to completely exclude a mild component hydrocephalus.  No intracranial mass lesion detected on this unenhanced exam.  IMPRESSION:  Scattered acute non hemorrhagic infarcts involving portions of the left sub insular region, left frontal lobe, left parietal lobe, occipital lobe bilaterally and cerebellum bilaterally.  This distribution raises possibility embolic disease.  No intracranial hemorrhage.  Scattered nonspecific white matter type changes may reflect result of small vessel disease.  Atrophy.  Ventricular prominence may be related to atrophy although not able to completely exclude a mild component hydrocephalus.  MRA HEAD  Findings: Anterior circulation without medium or large size vessel significant stenosis or occlusion.  Fetal type contribution to the posterior cerebral arteries.  Ectatic  vertebral arteries and basilar artery without high-grade stenosis.  Non-visualized right PICA and left AICA with narrowing and irregularity of the left PICA and right AICA.  Slightly bulbous appearance of the basilar tip and superior medial margin right posterior cerebral artery P1 segment without definitive saccular aneurysm.  Duplicated right superior cerebellar artery.  Anterior and posterior branch vessel irregularity.  IMPRESSION: Branch vessel irregularity. Please see above.  Critical Value/emergent results were called by telephone at the time of interpretation on 12/04/2012 at 4:17 p.m. to Dr.  Renae Gloss, who verbally acknowledged these results.   Original Report Authenticated By: Lacy Duverney, M.D.   Mr Brain Wo Contrast  12/04/2012   *RADIOLOGY REPORT*  Clinical Data:  Slurred speech. Low cardiac output.  MRI BRAIN WITHOUT CONTRAST MRA HEAD WITHOUT CONTRAST  Technique: Multiplanar, multiecho pulse sequences of the brain and surrounding structures were obtained according to standard protocol without intravenous contrast.  Angiographic images of the head were obtained using MRA technique without contrast.  Comparison: 12/04/2012 CT.  No comparison MR.  MRI HEAD  Findings:  Scattered acute non hemorrhagic infarcts involving portions of the left sub insular region, left frontal lobe, left parietal lobe, occipital lobe bilaterally and cerebellum bilaterally.  This distribution raises possibility embolic disease.  No intracranial hemorrhage.  Scattered nonspecific white matter type changes may reflect result of small vessel disease.  Atrophy.  Ventricular prominence may be related to atrophy although not able to completely exclude a mild component hydrocephalus.  No intracranial mass lesion detected on this unenhanced exam.  IMPRESSION:  Scattered acute non hemorrhagic infarcts involving portions of the left sub insular region, left frontal lobe, left parietal lobe, occipital lobe bilaterally and cerebellum bilaterally.  This distribution raises possibility embolic disease.  No intracranial hemorrhage.  Scattered nonspecific white matter type changes may reflect result of small vessel disease.  Atrophy.  Ventricular prominence may be related to atrophy although not able to completely exclude a mild component hydrocephalus.  MRA HEAD  Findings: Anterior circulation without medium or large size vessel significant stenosis or occlusion.  Fetal type contribution to the posterior cerebral arteries.  Ectatic vertebral arteries and basilar artery without high-grade stenosis.  Non-visualized right PICA and left AICA  with narrowing and irregularity of the left PICA and right AICA.  Slightly bulbous appearance of the basilar tip and superior medial margin right posterior cerebral artery P1 segment without definitive saccular aneurysm.  Duplicated right superior cerebellar artery.  Anterior and posterior branch vessel irregularity.  IMPRESSION: Branch vessel irregularity. Please see above.  Critical Value/emergent results were called by telephone at the time of interpretation on 12/04/2012 at 4:17 p.m. to Dr. Renae Gloss, who verbally acknowledged these results.   Original Report Authenticated By: Lacy Duverney, M.D.    Scheduled Meds: . aspirin EC  81 mg Oral Daily  . hyoscyamine  0.125 mg Oral Q24H  . multivitamin with minerals  1 tablet Oral Daily  . pantoprazole  40 mg Oral Daily   Continuous Infusions:   Active Problems:   Ovarian cancer   Personal history of venous thrombosis and embolism   CVA (cerebral infarction)   Elevated troponin   Systolic heart failure  Time spent: 40  Monique Pert, MD [facility] Pager [phone]. If 7 PM - 7 AM, please contact night-coverage at [email or web address], password [email or web address] 12/05/2012, 8:56 AM  LOS: 1 day

## 2012-12-05 NOTE — Progress Notes (Signed)
Shift event: Early am call from RN secondary to pt having more neuro deficits including aphasia, right arm weakness and difficulty ambulating to bathroom. Pt admitted earlier with stroke after having sx of HA, weakness, dysphagia, and unsteadiness at home. Hx of ovarian cancer. 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 NP immediately to bedside. Neuro exam: PERRL, no ptosis, right facial droop noted. + expressive aphasia. MOE x 4. Strength is 5/5 throughout except for 4+/5 RUE. She follows commands. Pt is obviously frustrated with the aphasia.  A/P: 1. Stoke, dx'd earlier on MRI/MRA brain. Now with extension of symptoms. This NP called neuro on call, Dr. Thad Ranger, who advised stat CT head to r/o bleed since pt on Xeralto. No further anticoagulation at this time and no change in meds/treatment plan at this time. Neuro is following, so day doc will round on pt. This NP explained situation to pt and tx plan at this time. She asked questions by writing them down although, I am sure she will have more. Will need PT/ST/OT consults.  2. Elevated troponins-addressed earlier today with cardio consult stating likely secondary to cardiomyopathy. R/p EKG done and looks unchanged from previous one. Will leave for cardio to see on rounds. No active chest pain.  Report given to oncoming [facility] attending. Jimmye Norman, NP [facility]

## 2012-12-05 NOTE — Progress Notes (Signed)
Patient's 3rd troponin level remained elevated, this time 1.0, patient has no complaints of chest pain. She had 5 beats of v.tach earlier tonight, asymptomatic. Monique Poag NP was made aware.

## 2012-12-05 NOTE — Consult Note (Signed)
ANTICOAGULATION CONSULT NOTE - Initial Consult  Pharmacy Consult for Heparin Indication: embolic stroke  Allergies  Allergen Reactions  . Septra [Sulfamethoxazole-Tmp Ds] Rash and Other (See Comments)    High fever, Burning skin  . Ampicillin Itching  . Fentanyl And Related Hives    Per Dr. Acey Lav, pt reports she is ok with fentanyl (09/10/12). Patient states it was epidural form of Fentanyl that she reacted to.   . Neosporin [Neomycin-Bacitracin Zn-Polymyx] Swelling and Other (See Comments)    opthalmic solution only can use topically. Swelling and inflamed eyes    Patient Measurements: Height: 5\' 7"  (170.2 cm) Weight: 151 lb 10.8 oz (68.8 kg) IBW/kg (Calculated) : 61.6 Heparin Dosing Weight: 69kg  Vital Signs: Temp: 98.2 F (36.8 C) (08/14 1000) Temp src: Oral (08/14 0947) BP: 117/66 mmHg (08/14 1400) Pulse Rate: 76 (08/14 1400)  Labs:  Recent Labs  12/04/12 1220 12/04/12 1644 12/04/12 2240 12/05/12 0455 12/05/12 1155  HGB 11.2*  --   --  10.8* 11.3*  HCT 34.0*  --   --  32.9* 33.7*  PLT 120*  --   --  108* PENDING  APTT 36  --   --   --   --   LABPROT 25.2*  --   --   --   --   INR 2.38*  --   --   --   --   CREATININE 0.59  --   --  0.60 0.61  TROPONINI 0.52* 0.95* 1.00* 1.11*  --     Estimated Creatinine Clearance: 68.2 ml/min (by C-G formula based on Cr of 0.61).   Medical History: Past Medical History  Diagnosis Date  . Arthritis   . Acute venous embolism and thrombosis of unspecified deep vessels of lower extremity   . Bronchitis     several times  . Abdominal or pelvic swelling, mass or lump, unspecified site   . Phlebitis and thrombophlebitis of unspecified site   . Pelvic mass   . Skin cancer   . Ovarian cancer   . Anxiety   . GERD (gastroesophageal reflux disease)   . Anemia   . PONV (postoperative nausea and vomiting)     "when fentanyl is used, will break out in itchy rast"   Assessment: 66yof with ovarian CA currently undergoing  chemotherapy was on xarelto pta for history of DVTs. She was admitted with slurred speech and xarelto was held (last dose 8/13 AM ~ 1000).  8/13 CT head: no mass, hemorrhage, or infarct 8/14 CT head: hemorrhagic transformation of infarct in left occipital 8/14 MRI: multiple infarcts suggestive of recurrent emboli, hemorrhage best seen on CT  She will now begin heparin. Will use aPTTs along with heparin levels to initially monitor as xarelto falsely elevates heparin levels.  Goal of Therapy:  APTT 60-85s Heparin level 0.3-0.5 units/ml Monitor platelets by anticoagulation protocol: Yes   Plan:  1) STAT aPTT and heparin level 2) After those labs drawn, begin heparin at 800 units/hr (12 units/kg/hr) with NO bolus 3) 6 hour aPTT and heparin level after heparin started   Fredrik Rigger 12/05/2012,2:29 PM

## 2012-12-05 NOTE — H&P (Signed)
Admission H&P    Chief Complaint: hemorrhage of transformation left occipital lobe.   HPI: Monique [name] is an 67 y.o. female  with a past medical history significant for ovarian cancer currently undergoing chemotherapy with gemcitabine, newly diagnosed systolic heart failure with EF 15%, chronic anticoagulation on xarelto due to bilateral DVT s/p IVC filter placement, admitted to [facility] with new onset dysarthria. She stated taht she went to bed previous night feeling well, but approximately 1 hour after waking up this morning noticed that her speech was slurred and she couldn't talk properly. Never had similar symptoms before.  She denies associated headache, vertigo, double vision, difficulty swallowing, confusion, or unsteadiness.  She presented to [facility] ED for further evaluation and management but was out of the window for intravenous thrombolysis.  CT brain upon arrival to ED showed no acute abnormality, but a subsequent MRI-DWI disclosed scattered. small acute non hemorrhagic infarcts involving portions of the left sub insular region, left frontal lobe, left parietal lobe, occipital lobe bilaterally and cerebellum bilaterally.  She is on xarelto.   Patient was noted early in the morning of 12/05/12 to have increased aphasia, right arm weakness and difficulty ambulating. repeat CT head showed hemorrhage left occipital lobe suggests hemorrhagic transformation. Patient was transferred to [facility] to 3100 to be placed on the stroke service. Patients Xarelto was held.     Past Medical History  Diagnosis Date  . Arthritis   . Acute venous embolism and thrombosis of unspecified deep vessels of lower extremity   . Bronchitis     several times  . Abdominal or pelvic swelling, mass or lump, unspecified site   . Phlebitis and thrombophlebitis of unspecified site   . Pelvic mass   . Skin cancer   . Ovarian cancer   . Anxiety   . GERD (gastroesophageal reflux disease)   . Anemia   . PONV (postoperative nausea  and vomiting)     "when fentanyl is used, will break out in itchy rast"    Past Surgical History  Procedure Laterality Date  . Appendectomy  1966    ruptured, hospital for 3 weeks  . Rhinoplasty  1976  . Vein ligation and stripping  1998    right leg  . Mohs surgery  2004, 2005    basal cell of the face  . Tooth extraction    . Laparotomy N/A 09/10/2012    Procedure: EXPLORATORY LAPAROTOMY, BILATERAL SALPINGO OOPHORECTOMY, TUMOR DEBULKING;  Surgeon: Rejeana Brock A. Duard Brady, MD;  Location: [facility] ORS;  Service: Gynecology;  Laterality: N/A;  . Exploratory laparotomy  09/10/12    Lysis of adhesions, BSO, suboptimal tumor debulking    Family History  Problem Relation Age of Onset  . Pneumonia Mother   . Heart attack Father    Social History:  reports that she quit smoking about 7 months ago. Her smoking use included Cigarettes. She smoked 0.00 packs per day for 30 years. She has never used smokeless tobacco. She reports that she does not drink alcohol or use illicit drugs.  Allergies:  Allergies  Allergen Reactions  . Septra [Sulfamethoxazole-Tmp Ds] Rash and Other (See Comments)    High fever, Burning skin  . Ampicillin Itching  . Fentanyl And Related Hives    Per Dr. Acey Lav, pt reports she is ok with fentanyl (09/10/12). Patient states it was epidural form of Fentanyl that she reacted to.   . Neosporin [Neomycin-Bacitracin Zn-Polymyx] Swelling and Other (See Comments)    opthalmic solution only can use  topically. Swelling and inflamed eyes    Medications Prior to Admission  Medication Sig Dispense Refill  . aspirin EC 81 MG tablet Take 81 mg by mouth daily.      . hyoscyamine ([name], ANASPAZ) 0.125 MG tablet Take 1 tablet (0.125 mg total) by mouth daily. As needed for bowel symptoms.  20 tablet  0  . LORazepam (ATIVAN) 0.5 MG tablet Take 0.5 mg by mouth every 8 (eight) hours as needed for anxiety (takes after Chemo treatment).       . Multiple Vitamin (MULTIVITAMIN WITH MINERALS) TABS  Take 1 tablet by mouth daily.      . pantoprazole (PROTONIX) 40 MG tablet Take 1 tablet (40 mg total) by mouth daily.  30 tablet  1  . Rivaroxaban (XARELTO) 20 MG TABS Take 1 tablet (20 mg total) by mouth every morning.  30 tablet  3  . senna (SENOKOT) 8.6 MG tablet Take 2 tablets by mouth daily as needed for constipation (after Chemo Treatment).      . sucralfate (CARAFATE) 1 G tablet Take 1 gram before meals and at bedtime as needed for GERD symptoms  60 tablet  0    ROS: All 12 systems reviewed and negative with exception of above mentioned.   Physical Examination: Blood pressure 106/42, pulse 69, temperature 98.2 F (36.8 C), temperature source Oral, resp. rate 19, height 5\' 7"  (1.702 m), weight 68.8 kg (151 lb 10.8 oz), SpO2 96.00%.  HEENT-  Normocephalic, no lesions, without obvious abnormality.  Normal external eye and conjunctiva.  Normal TM's bilaterally.  Normal auditory canals and external ears. Normal external nose, mucus membranes and septum.  Normal pharynx. Neck supple with no masses, nodes, nodules or enlargement. Cardiovascular - S1, S2 normal Lungs - chest clear, no wheezing, rales, normal symmetric air entry, Heart exam - S1, S2 normal, no murmur, no gallop, rate regular Abdomen - soft, non-tender; bowel sounds normal; no masses,  no organomegaly Extremities - less then 2 second capillary refill  Neurologic Examination: Mental Status: Alert, oriented, thought content appropriate.  Not able to express herself verbally but able to write down on paper.  She states her writing is not as clear as prior. Able to follow 3 step commands without difficulty. Cranial Nerves: II: Discs flat bilaterally; Visual fields grossly normal, pupils equal, round, reactive to light and accommodation III,IV, VI: ptosis  Present bilaterally, extra-ocular motions intact bilaterally V,VII: smile symmetric, facial light touch sensation normal bilaterally VIII: hearing normal bilaterally IX,X: gag  reflex present XI: bilateral shoulder shrug XII: midline tongue extension Motor: Right : Upper extremity   5/5    Left:     Upper extremity   5/5  Lower extremity   5/5     Lower extremity   5/5 Tone and bulk:normal tone throughout; no atrophy noted Sensory: Pinprick and light touch intact throughout, bilaterally Deep Tendon Reflexes:  Right: Upper Extremity   Left: Upper extremity   biceps (C-5 to C-6) 2/4   biceps (C-5 to C-6) 2/4 tricep (C7) 2/4    triceps (C7) 2/4 Brachioradialis (C6) 2/4  Brachioradialis (C6) 2/4  Lower Extremity Lower Extremity  quadriceps (L-2 to L-4) 2/4   quadriceps (L-2 to L-4) 2/4 Achilles (S1) 2/4   Achilles (S1) 2/4  Plantars: Right: downgoing   Left: downgoing Cerebellar: normal finger-to-nose,  normal heel-to-shin test CV: pulses palpable throughout    Results for orders placed during the hospital encounter of 12/04/12 (from the past 48 hour(s))  CBC WITH  DIFFERENTIAL     Status: Abnormal   Collection Time    12/04/12 12:20 PM      Result Value Range   WBC 5.4  4.0 - 10.5 K/uL   RBC 3.77 (*) 3.87 - 5.11 MIL/uL   Hemoglobin 11.2 (*) 12.0 - 15.0 g/dL   HCT 16.1 (*) 09.6 - 04.5 %   MCV 90.2  78.0 - 100.0 fL   MCH 29.7  26.0 - 34.0 pg   MCHC 32.9  30.0 - 36.0 g/dL   RDW 40.9 (*) 81.1 - 91.4 %   Platelets 120 (*) 150 - 400 K/uL   Neutrophils Relative % 84 (*) 43 - 77 %   Neutro Abs 4.6  1.7 - 7.7 K/uL   Lymphocytes Relative 9 (*) 12 - 46 %   Lymphs Abs 0.5 (*) 0.7 - 4.0 K/uL   Monocytes Relative 5  3 - 12 %   Monocytes Absolute 0.3  0.1 - 1.0 K/uL   Eosinophils Relative 1  0 - 5 %   Eosinophils Absolute 0.1  0.0 - 0.7 K/uL   Basophils Relative 0  0 - 1 %   Basophils Absolute 0.0  0.0 - 0.1 K/uL  PROTIME-INR     Status: Abnormal   Collection Time    12/04/12 12:20 PM      Result Value Range   Prothrombin Time 25.2 (*) 11.6 - 15.2 seconds   INR 2.38 (*) 0.00 - 1.49  APTT     Status: None   Collection Time    12/04/12 12:20 PM       Result Value Range   aPTT 36  24 - 37 seconds  TROPONIN I     Status: Abnormal   Collection Time    12/04/12 12:20 PM      Result Value Range   Troponin I 0.52 (*) <0.30 ng/mL   Comment:            Due to the release kinetics of cTnI,     a negative result within the first hours     of the onset of symptoms does not rule out     myocardial infarction with certainty.     If myocardial infarction is still suspected,     repeat the test at appropriate intervals.     CRITICAL RESULT CALLED TO, READ BACK BY AND VERIFIED WITH:     [id number]  COMPREHENSIVE METABOLIC PANEL     Status: Abnormal   Collection Time    12/04/12 12:20 PM      Result Value Range   Sodium 135  135 - 145 mEq/L   Potassium 3.8  3.5 - 5.1 mEq/L   Chloride 102  96 - 112 mEq/L   CO2 26  19 - 32 mEq/L   Glucose, Bld 107 (*) 70 - 99 mg/dL   BUN 15  6 - 23 mg/dL   Creatinine, Ser 7.82  0.50 - 1.10 mg/dL   Calcium 9.4  8.4 - 95.6 mg/dL   Total Protein 6.7  6.0 - 8.3 g/dL   Albumin 3.5  3.5 - 5.2 g/dL   AST 23  0 - 37 U/L   ALT 18  0 - 35 U/L   Alkaline Phosphatase 108  39 - 117 U/L   Total Bilirubin 0.5  0.3 - 1.2 mg/dL   GFR calc non Af Amer >90  >90 mL/min   GFR calc Af Amer >90  >90 mL/min   Comment: (NOTE)  The eGFR has been calculated using the CKD EPI equation.     This calculation has not been validated in all clinical situations.     eGFR's persistently <90 mL/min signify possible Chronic Kidney     Disease.  HEMOGLOBIN A1C     Status: None   Collection Time    12/04/12 12:20 PM      Result Value Range   Hemoglobin A1C 5.4  <5.7 %   Comment: (NOTE)                                                                               According to the ADA Clinical Practice Recommendations for 2011, when     HbA1c is used as a screening test:      >=6.5%   Diagnostic of Diabetes Mellitus               (if abnormal result is confirmed)     5.7-6.4%   Increased risk of developing Diabetes  Mellitus     References:Diagnosis and Classification of Diabetes Mellitus,Diabetes     Care,2011,34(Suppl 1):S62-S69 and Standards of Medical Care in             Diabetes - 2011,Diabetes Care,2011,34 (Suppl 1):S11-S61.   Mean Plasma Glucose 108  <117 mg/dL   Comment: Performed at [facility]  URINALYSIS, ROUTINE W REFLEX MICROSCOPIC     Status: Abnormal   Collection Time    12/04/12  1:18 PM      Result Value Range   Color, Urine YELLOW  YELLOW   APPearance CLEAR  CLEAR   Specific Gravity, Urine 1.022  1.005 - 1.030   pH 6.5  5.0 - 8.0   Glucose, UA NEGATIVE  NEGATIVE mg/dL   Hgb urine dipstick MODERATE (*) NEGATIVE   Bilirubin Urine NEGATIVE  NEGATIVE   Ketones, ur NEGATIVE  NEGATIVE mg/dL   Protein, ur NEGATIVE  NEGATIVE mg/dL   Urobilinogen, UA 0.2  0.0 - 1.0 mg/dL   Nitrite NEGATIVE  NEGATIVE   Leukocytes, UA NEGATIVE  NEGATIVE  URINE MICROSCOPIC-ADD ON     Status: Abnormal   Collection Time    12/04/12  1:18 PM      Result Value Range   Squamous Epithelial / LPF RARE  RARE   WBC, UA 0-2  <3 WBC/hpf   RBC / HPF 3-6  <3 RBC/hpf   Bacteria, UA FEW (*) RARE  TROPONIN I     Status: Abnormal   Collection Time    12/04/12  4:44 PM      Result Value Range   Troponin I 0.95 (*) <0.30 ng/mL   Comment:            Due to the release kinetics of cTnI,     a negative result within the first hours     of the onset of symptoms does not rule out     myocardial infarction with certainty.     If myocardial infarction is still suspected,     repeat the test at appropriate intervals.     CRITICAL RESULT CALLED TO, READ BACK BY AND VERIFIED WITH:     [id number]  GLUCOSE, CAPILLARY  Status: None   Collection Time    12/04/12  9:34 PM      Result Value Range   Glucose-Capillary 90  70 - 99 mg/dL  TROPONIN I     Status: Abnormal   Collection Time    12/04/12 10:40 PM      Result Value Range   Troponin I 1.00 (*) <0.30 ng/mL   Comment:            Due to  the release kinetics of cTnI,     a negative result within the first hours     of the onset of symptoms does not rule out     myocardial infarction with certainty.     If myocardial infarction is still suspected,     repeat the test at appropriate intervals.     CRITICAL VALUE NOTED.  VALUE IS CONSISTENT WITH PREVIOUSLY REPORTED AND CALLED VALUE.  TROPONIN I     Status: Abnormal   Collection Time    12/05/12  4:55 AM      Result Value Range   Troponin I 1.11 (*) <0.30 ng/mL   Comment:            Due to the release kinetics of cTnI,     a negative result within the first hours     of the onset of symptoms does not rule out     myocardial infarction with certainty.     If myocardial infarction is still suspected,     repeat the test at appropriate intervals.     CRITICAL VALUE NOTED.  VALUE IS CONSISTENT WITH PREVIOUSLY REPORTED AND CALLED VALUE.  LIPID PANEL     Status: None   Collection Time    12/05/12  4:55 AM      Result Value Range   Cholesterol 148  0 - 200 mg/dL   Triglycerides 89  <469 mg/dL   HDL 43  >62 mg/dL   Total CHOL/HDL Ratio 3.4     VLDL 18  0 - 40 mg/dL   LDL Cholesterol 87  0 - 99 mg/dL   Comment:            Total Cholesterol/HDL:CHD Risk     Coronary Heart Disease Risk Table                         Men   Women      1/2 Average Risk   3.4   3.3      Average Risk       5.0   4.4      2 X Average Risk   9.6   7.1      3 X Average Risk  23.4   11.0                Use the calculated Patient Ratio     above and the CHD Risk Table     to determine the patient's CHD Risk.                ATP III CLASSIFICATION (LDL):      <100     mg/dL   Optimal      952-841  mg/dL   Near or Above                        Optimal      130-159  mg/dL   Borderline  160-189  mg/dL   High      >045     mg/dL   Very High     Performed at [facility]  BASIC METABOLIC PANEL     Status: Abnormal   Collection Time    12/05/12  4:55 AM      Result Value Range   Sodium  134 (*) 135 - 145 mEq/L   Potassium 3.8  3.5 - 5.1 mEq/L   Chloride 102  96 - 112 mEq/L   CO2 26  19 - 32 mEq/L   Glucose, Bld 100 (*) 70 - 99 mg/dL   BUN 13  6 - 23 mg/dL   Creatinine, Ser 4.09  0.50 - 1.10 mg/dL   Calcium 9.2  8.4 - 81.1 mg/dL   GFR calc non Af Amer >90  >90 mL/min   GFR calc Af Amer >90  >90 mL/min   Comment: (NOTE)     The eGFR has been calculated using the CKD EPI equation.     This calculation has not been validated in all clinical situations.     eGFR's persistently <90 mL/min signify possible Chronic Kidney     Disease.  CBC     Status: Abnormal   Collection Time    12/05/12  4:55 AM      Result Value Range   WBC 5.2  4.0 - 10.5 K/uL   RBC 3.65 (*) 3.87 - 5.11 MIL/uL   Hemoglobin 10.8 (*) 12.0 - 15.0 g/dL   HCT 91.4 (*) 78.2 - 95.6 %   MCV 90.1  78.0 - 100.0 fL   MCH 29.6  26.0 - 34.0 pg   MCHC 32.8  30.0 - 36.0 g/dL   RDW 21.3 (*) 08.6 - 57.8 %   Platelets 108 (*) 150 - 400 K/uL   Comment: SPECIMEN CHECKED FOR CLOTS     REPEATED TO VERIFY     PLATELET COUNT CONFIRMED BY SMEAR  GLUCOSE, CAPILLARY     Status: None   Collection Time    12/05/12 10:12 AM      Result Value Range   Glucose-Capillary 99  70 - 99 mg/dL   Ct Head Wo Contrast  12/05/2012   *RADIOLOGY REPORT*  Clinical Data: Increasing symptoms with right upper extremity weakness and expressive ectasia.  CT HEAD WITHOUT CONTRAST  Technique:  Contiguous axial images were obtained from the base of the skull through the vertex without contrast.  Comparison: 12/04/2012 MR and CT.  Findings: Scattered infarcts supratentorially (greater on the left) and within the cerebellum noted but better visualized on recent MR. It is difficult by CT examination to determine if there has been extension of these infarcts.  Hemorrhage left occipital lobe suggests hemorrhagic transformation of infarct in this region.  Ventricular prominence may be related to atrophy although mild hydrocephalus is also a consideration.   No intracranial mass lesion detected on this unenhanced exam.  IMPRESSION: Hemorrhage left occipital lobe suggests hemorrhagic transformation of infarct in this region.  Scattered infarcts supratentorially (greater on the left) and within the cerebellum noted but better visualized on recent MR. It is difficult by CT examination to determine if there has been extension of these infarcts. MR would prove helpful if this would change the patient's course of therapy.  Ventricular prominence may be related to atrophy although mild hydrocephalus is also a consideration.  Critical Value/emergent results were called by telephone at the time of interpretation on 12/05/2012 at 8:06 a.m. to Dr. Pincus Badder, who  verbally acknowledged these results.   Original Report Authenticated By: Lacy Duverney, M.D.   Ct Head Wo Contrast  12/04/2012   *RADIOLOGY REPORT*  Clinical Data: Dysarthria and dysphagia; ovarian carcinoma  CT HEAD WITHOUT CONTRAST  Technique:  Contiguous axial images were obtained from the base of the skull through the vertex without contrast. Study was obtained within 24 hours of patient arrival at the emergency department.  Comparison: None.  Findings: There is moderate generalized ventricular enlargement. Sulci appear normal.  There is no mass, hemorrhage, extra-axial fluid collection, or midline shift.  No gray-white compartment lesions are identified. No acute infarct is appreciated on this study.  Bony calvarium appears intact.  The mastoid air cells are clear.  IMPRESSION: Ventricular enlargement without corresponding sulcal prominence. 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  No mass, hemorrhage, or acute appearing infarct.  No gray-white compartment lesions are identified.   Original Report Authenticated By: Bretta Bang, M.D.   Mr Angiogram Head Wo Contrast  12/04/2012   *RADIOLOGY REPORT*  Clinical Data:  Slurred speech. Low cardiac output.  MRI BRAIN WITHOUT CONTRAST MRA HEAD  WITHOUT CONTRAST  Technique: Multiplanar, multiecho pulse sequences of the brain and surrounding structures were obtained according to standard protocol without intravenous contrast.  Angiographic images of the head were obtained using MRA technique without contrast.  Comparison: 12/04/2012 CT.  No comparison MR.  MRI HEAD  Findings:  Scattered acute non hemorrhagic infarcts involving portions of the left sub insular region, left frontal lobe, left parietal lobe, occipital lobe bilaterally and cerebellum bilaterally.  This distribution raises possibility embolic disease.  No intracranial hemorrhage.  Scattered nonspecific white matter type changes may reflect result of small vessel disease.  Atrophy.  Ventricular prominence may be related to atrophy although not able to completely exclude a mild component hydrocephalus.  No intracranial mass lesion detected on this unenhanced exam.  IMPRESSION:  Scattered acute non hemorrhagic infarcts involving portions of the left sub insular region, left frontal lobe, left parietal lobe, occipital lobe bilaterally and cerebellum bilaterally.  This distribution raises possibility embolic disease.  No intracranial hemorrhage.  Scattered nonspecific white matter type changes may reflect result of small vessel disease.  Atrophy.  Ventricular prominence may be related to atrophy although not able to completely exclude a mild component hydrocephalus.  MRA HEAD  Findings: Anterior circulation without medium or large size vessel significant stenosis or occlusion.  Fetal type contribution to the posterior cerebral arteries.  Ectatic vertebral arteries and basilar artery without high-grade stenosis.  Non-visualized right PICA and left AICA with narrowing and irregularity of the left PICA and right AICA.  Slightly bulbous appearance of the basilar tip and superior medial margin right posterior cerebral artery P1 segment without definitive saccular aneurysm.  Duplicated right superior  cerebellar artery.  Anterior and posterior branch vessel irregularity.  IMPRESSION: Branch vessel irregularity. Please see above.  Critical Value/emergent results were called by telephone at the time of interpretation on 12/04/2012 at 4:17 p.m. to Dr. Renae Gloss, who verbally acknowledged these results.   Original Report Authenticated By: Lacy Duverney, M.D.   Mr Brain Wo Contrast  12/04/2012   *RADIOLOGY REPORT*  Clinical Data:  Slurred speech. Low cardiac output.  MRI BRAIN WITHOUT CONTRAST MRA HEAD WITHOUT CONTRAST  Technique: Multiplanar, multiecho pulse sequences of the brain and surrounding structures were obtained according to standard protocol without intravenous contrast.  Angiographic images of the head were obtained using MRA technique without contrast.  Comparison: 12/04/2012 CT.  No comparison MR.  MRI HEAD  Findings:  Scattered acute non hemorrhagic infarcts involving portions of the left sub insular region, left frontal lobe, left parietal lobe, occipital lobe bilaterally and cerebellum bilaterally.  This distribution raises possibility embolic disease.  No intracranial hemorrhage.  Scattered nonspecific white matter type changes may reflect result of small vessel disease.  Atrophy.  Ventricular prominence may be related to atrophy although not able to completely exclude a mild component hydrocephalus.  No intracranial mass lesion detected on this unenhanced exam.  IMPRESSION:  Scattered acute non hemorrhagic infarcts involving portions of the left sub insular region, left frontal lobe, left parietal lobe, occipital lobe bilaterally and cerebellum bilaterally.  This distribution raises possibility embolic disease.  No intracranial hemorrhage.  Scattered nonspecific white matter type changes may reflect result of small vessel disease.  Atrophy.  Ventricular prominence may be related to atrophy although not able to completely exclude a mild component hydrocephalus.  MRA HEAD  Findings: Anterior  circulation without medium or large size vessel significant stenosis or occlusion.  Fetal type contribution to the posterior cerebral arteries.  Ectatic vertebral arteries and basilar artery without high-grade stenosis.  Non-visualized right PICA and left AICA with narrowing and irregularity of the left PICA and right AICA.  Slightly bulbous appearance of the basilar tip and superior medial margin right posterior cerebral artery P1 segment without definitive saccular aneurysm.  Duplicated right superior cerebellar artery.  Anterior and posterior branch vessel irregularity.  IMPRESSION: Branch vessel irregularity. Please see above.  Critical Value/emergent results were called by telephone at the time of interpretation on 12/04/2012 at 4:17 p.m. to Dr. Renae Gloss, who verbally acknowledged these results.   Original Report Authenticated By: Lacy Duverney, M.D.    Assessment/Plan  67 years old with ovarian cancer, bilateral DVT on chronic anticoagulation, systolic heart failure with EF 15%, admitted with acute onset dysarthria without other associated neurological findings on neuro-exam, and MRI-DWI revealing scattered small areas of acute infarction with a pattern highly consistent with embolism with repeat CT findings of hemorrhagic transformation left occipital lobe.  Gibson Ramp currently being held until MRI brain is obtained (ordered STAT at 11 AM).    Plan:  1) Admit to 3100, on tele with frequent neuro checks 2) MRI brain to assess hemorrhagic transformation, new strokes, or extension of prior stroke. 3) NPO until speech evaluates--failed swallow screen 4) Hold Xeralto until MRI is finished.   Wyatt Portela, MD [facility].

## 2012-12-05 NOTE — Progress Notes (Addendum)
Patient Name: Monique [name] Date of Encounter: 12/05/2012    SUBJECTIVE: The patient has difficulty speaking this morning. She denies headache.  The chart is reviewed. The presentation with stroke is appreciated. This occurred while the patient was on anticoagulation with Xarelto.  She has elevated cardiac markers in conjunction with a surprisingly low LVEF on echocardiography performed 11/27/12. A BNP performed as a screen to help determine if the EF of 15% represented reality was 400. We had scheduled the patient to have a resting MUGA scan performed to confirm the EF. She continues to deny cardiac symptomatology.  TELEMETRY:   Filed Vitals:   12/05/12 0947 12/05/12 1000 12/05/12 1100 12/05/12 1200  BP: 110/56 109/62 106/42 98/54  Pulse: 83 77 69 72  Temp: 98.2 F (36.8 C) 98.2 F (36.8 C)    TempSrc: Oral     Resp: 17 22 19 20   Height: 5\' 7"  (1.702 m)     Weight: 68.8 kg (151 lb 10.8 oz)     SpO2: 99% 98% 96% 98%    Intake/Output Summary (Last 24 hours) at 12/05/12 1259 Last data filed at 12/05/12 0834  Gross per 24 hour  Intake    120 ml  Output    600 ml  Net   -480 ml    LABS: Basic Metabolic Panel:  Recent Labs  78/46/96 1220 12/05/12 0455  NA 135 134*  K 3.8 3.8  CL 102 102  CO2 26 26  GLUCOSE 107* 100*  BUN 15 13  CREATININE 0.59 0.60  CALCIUM 9.4 9.2   CBC:  Recent Labs  12/04/12 1220 12/05/12 0455 12/05/12 1155  WBC 5.4 5.2 5.0  NEUTROABS 4.6  --   --   HGB 11.2* 10.8* 11.3*  HCT 34.0* 32.9* 33.7*  MCV 90.2 90.1 89.9  PLT 120* 108* PENDING   Cardiac Enzymes:  Recent Labs  12/04/12 1644 12/04/12 2240 12/05/12 0455  TROPONINI 0.95* 1.00* 1.11*    Hemoglobin A1C:  Recent Labs  12/04/12 1220  HGBA1C 5.4   Fasting Lipid Panel:  Recent Labs  12/05/12 0455  CHOL 148  HDL 43  LDLCALC 87  TRIG 89  CHOLHDL 3.4    Radiology/Studies:  No cardiac data  Physical Exam: Blood pressure 98/54, pulse 72, temperature 98.2 F (36.8 C),  temperature source Oral, resp. rate 20, height 5\' 7"  (1.702 m), weight 68.8 kg (151 lb 10.8 oz), SpO2 98.00%. Weight change:    No gallop, rub, or murmur.  No JVD.  Clear lung fields.  No peripheral edema.  Aphasia with right-sided weakness. This is obviously new  saw her one week ago as an outpatient  ASSESSMENT:  1. Asymptomatic systolic heart failure, likely represents cardiotoxicity from Taxol or one of the other chemotherapy agents. The fact that she is still leaking enzymes from her heart is concerning and some studies have demonstrated that elevated markers in the setting represent a poor outcome.  2. 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 Anticoagulation is obviously important, but with the hemorrhagic transformation of stroke, not currently possible.  3. Elevated troponin, not likely related to ischemic heart disease but rather cardiotoxicity. Therefore, ACS does not exist clinically.  Plan:  1. We need to confirm the EF by means other than echo at some point prior to discharge.  2. When safe we should institute heart failure therapy that may help improve function (beta blocker therapy and angiotensin women  system blockade).  3. Avoid cardiotoxic agents  4. Prognosis is guarded given comorbidities including metastatic ovarian, LV dysfunction, and now stroke with bleeding intracranially.  Selinda Eon 12/05/2012, 12:59 PM

## 2012-12-06 ENCOUNTER — Inpatient Hospital Stay (HOSPITAL_COMMUNITY): Payer: Medicare Other

## 2012-12-06 ENCOUNTER — Ambulatory Visit (HOSPITAL_COMMUNITY): Payer: Medicare Other

## 2012-12-06 LAB — HEPARIN LEVEL (UNFRACTIONATED)
Heparin Unfractionated: 0.21 IU/mL — ABNORMAL LOW (ref 0.30–0.70)
Heparin Unfractionated: 0.27 IU/mL — ABNORMAL LOW (ref 0.30–0.70)

## 2012-12-06 LAB — GLUCOSE, CAPILLARY
Glucose-Capillary: 99 mg/dL (ref 70–99)
Glucose-Capillary: 102 mg/dL — ABNORMAL HIGH (ref 70–99)

## 2012-12-06 LAB — TROPONIN I: Troponin I: 1.3 ng/mL (ref <0.30)

## 2012-12-06 LAB — APTT
aPTT: 50 seconds — ABNORMAL HIGH (ref 24–37)
aPTT: 55 seconds — ABNORMAL HIGH (ref 24–37)

## 2012-12-06 LAB — BASIC METABOLIC PANEL
BUN: 12 mg/dL (ref 6–23)
Calcium: 9.2 mg/dL (ref 8.4–10.5)
Creatinine, Ser: 0.54 mg/dL (ref 0.50–1.10)
GFR calc Af Amer: >90 mL/min (ref >90)
GFR calc non Af Amer: >90 mL/min (ref >90)

## 2012-12-06 LAB — PROTIME-INR
INR: 1.05 (ref 0.00–1.49)
Prothrombin Time: 13.5 seconds (ref 11.6–15.2)

## 2012-12-06 MED ORDER — SPIRONOLACTONE 25 MG PO TABS
25.0000 mg | ORAL_TABLET | Freq: Every day | ORAL | Status: DC
  Administered 2012-12-06 – 2012-12-11 (×6): 25 mg via ORAL
  Filled 2012-12-06 (×6): qty 1

## 2012-12-06 MED ORDER — HEPARIN (PORCINE) IN NACL 100-0.45 UNIT/ML-% IJ SOLN
1250.0000 [IU]/h | INTRAMUSCULAR | Status: DC
  Administered 2012-12-06 – 2012-12-08 (×3): 1250 [IU]/h via INTRAVENOUS
  Filled 2012-12-06 (×3): qty 250

## 2012-12-06 MED ORDER — WARFARIN VIDEO: Freq: Once | Status: DC

## 2012-12-06 MED ORDER — WARFARIN SODIUM 5 MG PO TABS
5.0000 mg | ORAL_TABLET | Freq: Once | ORAL | Status: AC
  Administered 2012-12-06: 5 mg via ORAL
  Filled 2012-12-06: qty 1

## 2012-12-06 MED ORDER — POTASSIUM CHLORIDE CRYS ER 20 MEQ PO TBCR
20.0000 meq | EXTENDED_RELEASE_TABLET | Freq: Once | ORAL | Status: AC
  Administered 2012-12-06: 20 meq via ORAL
  Filled 2012-12-06: qty 1

## 2012-12-06 MED ORDER — COUMADIN BOOK
Freq: Once | Status: AC
  Administered 2012-12-06: 18:00:00
  Filled 2012-12-06: qty 1

## 2012-12-06 MED ORDER — WARFARIN - PHARMACIST DOSING INPATIENT: Freq: Every day | Status: DC

## 2012-12-06 NOTE — Evaluation (Signed)
Speech Language Pathology Evaluation Patient Details Name: Monique [name] MRN: [id number] DOB: 06/06/1945 Today's Date: 12/06/2012 Time: 4098-1191 SLP Time Calculation (min): 37 min  Problem List:  Patient Active Problem List   Diagnosis Date Noted  . CVA (cerebral infarction) 12/04/2012  . Elevated troponin 12/04/2012  . Systolic heart failure 12/04/2012    Class: Acute  . Ovarian cancer 06/12/2012  . Personal history of venous thrombosis and embolism 06/12/2012   Past Medical History:  Past Medical History  Diagnosis Date  . Arthritis   . Acute venous embolism and thrombosis of unspecified deep vessels of lower extremity   . Bronchitis     several times  . Abdominal or pelvic swelling, mass or lump, unspecified site   . Phlebitis and thrombophlebitis of unspecified site   . Pelvic mass   . Skin cancer   . Ovarian cancer   . Anxiety   . GERD (gastroesophageal reflux disease)   . Anemia   . PONV (postoperative nausea and vomiting)     "when fentanyl is used, will break out in itchy rast"   Past Surgical History:  Past Surgical History  Procedure Laterality Date  . Appendectomy  1966    ruptured, hospital for 3 weeks  . Rhinoplasty  1976  . Vein ligation and stripping  1998    right leg  . Mohs surgery  2004, 2005    basal cell of the face  . Tooth extraction    . Laparotomy N/A 09/10/2012    Procedure: EXPLORATORY LAPAROTOMY, BILATERAL SALPINGO OOPHORECTOMY, TUMOR DEBULKING;  Surgeon: Rejeana Brock A. Duard Brady, MD;  Location: [facility];  Service: Gynecology;  Laterality: N/A;  . Exploratory laparotomy  09/10/12    Lysis of adhesions, BSO, suboptimal tumor debulking   HPI:  Monique [name] is an 67 y.o. female  with a past medical history significant for ovarian cancer currently undergoing chemotherapy with gemcitabine, newly diagnosed systolic heart failure with EF 15%, chronic anticoagulation on xarelto due to bilateral DVT s/p IVC filter placement, admitted to [facility] with new onset  dysarthria. She stated taht she went to bed previous night feeling well, but approximately 1 hour after waking up this morning noticed that her speech was slurred and she couldn't talk properly. Never had similar symptoms before.  MRI shows scattered emboli bilaterally.    Assessment / Plan / Recommendation Clinical Impression  Pt demonstrates a moderate dysarthria with weakness of articulators and impaired fine motor planning. Pt requires moderate visual and auditory cues to make complete articulatory contacts, best with most frontal contacts (bilabials and alveolars) worse with palatal and velar phonemes. Pt demonstrates increased groping behaviors when initiating words independently or with multisyllabic words, suggesting motor planning deficits. Pts langauge and cognition appear to be intact, though further investigation into grammar and high level word finding is warranted. At this time pt is relying on writing as primary method of communication due to embarassment about speech. Encouraged pt to attempt verbal speech with slow, loud overarticulation with pausing between words in addition to gestures and writing. Also provided threapeutic intervention such as tandem speech and articulatory cues with automatic tasks to improve fluency. Pt will likely need Outpatient SLP f/u. SLP will continue to follow acutely for improved articulation.     SLP Assessment  Patient needs continued Speech Lanaguage Pathology Services    Follow Up Recommendations  Outpatient SLP    Frequency and Duration min 2x/week  2 weeks   Pertinent Vitals/Pain NA   SLP Goals  SLP Goals Potential to Achieve Goals: Good SLP Goal #1: Pt will verbalize basic wants and needs at phrase level in 80% of opportunities with min verbal cues for slow loud overarticualted speech.  SLP Goal #1 - Progress: Progressing toward goal SLP Goal #2: Pt will correctly produce 2-3 syllable words x10 with adequate articulation with max auditory  and visual cues.   SLP Evaluation Prior Functioning  Cognitive/Linguistic Baseline: Within functional limits  Lives With: Significant other Available Help at Discharge: Family   Cognition  Overall Cognitive Status: Within Functional Limits for tasks assessed Orientation Level: Oriented X4;Other (comment) (Pt writes correct responses on paper)    Comprehension  Auditory Comprehension Overall Auditory Comprehension: Appears within functional limits for tasks assessed    Expression Verbal Expression Overall Verbal Expression: Appears within functional limits for tasks assessed   Oral / Motor Oral Motor/Sensory Function Overall Oral Motor/Sensory Function: Impaired Labial ROM: Reduced right Labial Symmetry: Abnormal symmetry right Labial Strength: Reduced Lingual ROM: Reduced right (reduced base of tongue elevation) Lingual Symmetry: Abnormal symmetry right Lingual Strength: Reduced Lingual Sensation: Reduced Facial ROM: Reduced right Facial Symmetry: Right droop Facial Strength: Reduced Facial Sensation: Reduced Velum: Impaired right;Impaired left (reduced palatal elevation) Motor Speech Overall Motor Speech: Impaired Respiration: Within functional limits Phonation: Normal Resonance: Hypernasality Articulation: Impaired Level of Impairment: Word Intelligibility: Intelligibility reduced Word: 25-49% accurate Phrase: 25-49% accurate Sentence: 25-49% accurate Conversation: Not tested Motor Planning: Impaired Level of Impairment: Word Motor Speech Errors: Groping for words;Aware   GO    Harlon Ditty, MA CCC-SLP [phone]  Claudine Mouton 12/06/2012, 9:11 AM

## 2012-12-06 NOTE — Progress Notes (Signed)
Speech Language Pathology Dysphagia Treatment Patient Details Name: Monique [name] MRN: [id number] DOB: April 07, 1946 Today's Date: 12/06/2012 Time: 0815-0825 SLP Time Calculation (min): 10 min  Assessment / Plan / Recommendation Clinical Impression  Pt continues to demonstrate adequate tolerance of POs. No SLP f/u needed for swallowing.     Diet Recommendation  Continue with Current Diet: Regular;Thin liquid    SLP Plan All goals met   Pertinent Vitals/Pain NA   Swallowing Goals  SLP Swallowing Goals Patient will utilize recommended strategies during swallow to increase swallowing safety with: Minimal assistance Swallow Study Goal #2 - Progress: Met  General    Oral Cavity - Oral Hygiene Does patient have any of the following "at risk" factors?: None of the above Brush patient's teeth BID with toothbrush (using toothpaste with fluoride): Yes   Dysphagia Treatment Treatment focused on: Skilled observation of diet tolerance Treatment Methods/Modalities: Skilled observation Patient observed directly with PO's: Yes Type of PO's observed: Thin liquids Feeding: Able to feed self Liquids provided via: Straw Type of cueing: Verbal Amount of cueing: Minimal   GO    Monique Ditty, MA CCC-SLP [phone]  Monique [name] 12/06/2012, 9:15 AM

## 2012-12-06 NOTE — Consult Note (Signed)
ANTICOAGULATION CONSULT NOTE - Initial Consult  Pharmacy Consult for Heparin Indication: embolic stroke  Allergies  Allergen Reactions  . Septra [Sulfamethoxazole-Tmp Ds] Rash and Other (See Comments)    High fever, Burning skin  . Ampicillin Itching  . Fentanyl And Related Hives    Per Dr. Acey Lav, pt reports she is ok with fentanyl (09/10/12). Patient states it was epidural form of Fentanyl that she reacted to.   . Neosporin [Neomycin-Bacitracin Zn-Polymyx] Swelling and Other (See Comments)    opthalmic solution only can use topically. Swelling and inflamed eyes    Patient Measurements: Height: 5\' 7"  (170.2 cm) Weight: 151 lb 10.8 oz (68.8 kg) IBW/kg (Calculated) : 61.6 Heparin Dosing Weight: 69kg  Vital Signs: Temp: 99.9 F (37.7 C) (08/14 1949) Temp src: Oral (08/14 1949) BP: 94/49 mmHg (08/15 0000) Pulse Rate: 76 (08/15 0000)  Labs:  Recent Labs  12/04/12 1220 12/04/12 1644 12/04/12 2240 12/05/12 0455 12/05/12 1155 12/05/12 1459 12/05/12 2222  HGB 11.2*  --   --  10.8* 11.3*  --   --   HCT 34.0*  --   --  32.9* 33.7*  --   --   PLT 120*  --   --  108* 103*  --   --   APTT 36  --   --   --   --  31 38*  LABPROT 25.2*  --   --   --   --   --   --   INR 2.38*  --   --   --   --   --   --   HEPARINUNFRC  --   --   --   --   --  0.13* 0.18*  CREATININE 0.59  --   --  0.60 0.61  --   --   TROPONINI 0.52* 0.95* 1.00* 1.11*  --   --   --     Estimated Creatinine Clearance: 68.2 ml/min (by C-G formula based on Cr of 0.61).   Medical History: Past Medical History  Diagnosis Date  . Arthritis   . Acute venous embolism and thrombosis of unspecified deep vessels of lower extremity   . Bronchitis     several times  . Abdominal or pelvic swelling, mass or lump, unspecified site   . Phlebitis and thrombophlebitis of unspecified site   . Pelvic mass   . Skin cancer   . Ovarian cancer   . Anxiety   . GERD (gastroesophageal reflux disease)   . Anemia   . PONV  (postoperative nausea and vomiting)     "when fentanyl is used, will break out in itchy rast"   Assessment: HL 0.18 --xarelto pta most likely not interfering with result (would expect much higher Xa level) no bleeding or infusion related issues. aptt is 38 seconds which should generally correlate with subtherapeutic HL  Goal of Therapy:  APTT 60-85s Heparin level 0.3-0.5 units/ml Monitor platelets by anticoagulation protocol: Yes   Plan:   Increase to 1000 units/hr and recheck HL and aPTT  in  6 hours   Janice Coffin 12/06/2012,1:12 AM

## 2012-12-06 NOTE — Progress Notes (Addendum)
Patient Name: Monique [name] Date of Encounter: 12/06/2012    SUBJECTIVE: The patient is in CT this morning during my clinical visit. Speaking with her nurse, there is been no chest discomfort or clinical cardiac problem  TELEMETRY:  Normal sinus rhythm: Filed Vitals:   12/06/12 0400 12/06/12 0430 12/06/12 0500 12/06/12 0600  BP: 98/54  97/47   Pulse: 64  64 75  Temp:  98.2 F (36.8 C)    TempSrc:  Oral    Resp: 17  17 17   Height:      Weight:   69.6 kg (153 lb 7 oz)   SpO2: 96%  96% 97%    Intake/Output Summary (Last 24 hours) at 12/06/12 0710 Last data filed at 12/06/12 0600  Gross per 24 hour  Intake 1466.2 ml  Output    100 ml  Net 1366.2 ml    LABS: Basic Metabolic Panel:  Recent Labs  16/10/96 0455 12/05/12 1155  NA 134* 137  K 3.8 4.1  CL 102 101  CO2 26 26  GLUCOSE 100* 98  BUN 13 12  CREATININE 0.60 0.61  CALCIUM 9.2 9.7   CBC:  Recent Labs  12/04/12 1220 12/05/12 0455 12/05/12 1155  WBC 5.4 5.2 5.0  NEUTROABS 4.6  --   --   HGB 11.2* 10.8* 11.3*  HCT 34.0* 32.9* 33.7*  MCV 90.2 90.1 89.9  PLT 120* 108* 103*   Cardiac Enzymes:  Recent Labs  12/04/12 2240 12/05/12 0455 12/06/12 0430  TROPONINI 1.00* 1.11* 1.30*   BNP    Component Value Date/Time   PROBNP 1659.0* 12/05/2012 1417   Hemoglobin A1C:  Recent Labs  12/04/12 1220  HGBA1C 5.4   Fasting Lipid Panel:  Recent Labs  12/05/12 0455  CHOL 148  HDL 43  LDLCALC 87  TRIG 89  CHOLHDL 3.4    Radiology/Studies:   the chest x-ray this admission   Physical Exam: Blood pressure 97/47, pulse 75, temperature 98.2 F (36.8 C), temperature source Oral, resp. rate 17, height 5\' 7"  (1.702 m), weight 69.6 kg (153 lb 7 oz), SpO2 97.00%. Weight change: 1.668 kg (3 lb 10.8 oz)    Pior clinical exams have not demonstrated gallop or neck vein elevation. Again she is in CT scan of this morning.  ASSESSMENT:  1. Systolic heart failure, acute on chronic with elevated BNP now 3 times  higher than the outpatient value and elevated troponins. I suspect this patient has cardiotoxicity from chemotherapy (Taxol, versus other). It is remotely possible that she could have coronary disease although the contractile pattern does not suggest coronary disease/infarction.  2. Relatively low blood pressure, limiting our ability to institute heart failure therapy.  Plan:  1. Care with IV fluid administration to avoid overt heart failure. Will start low dose aldactone as a hedge agains acute decomp.  2. As possible, we should gingerly institute heart failure therapy if tolerated by BP  3. Resume anticoagulation therapy wasn't possible  4. Overall worrisome prognosis   Signed, Lesleigh Noe 12/06/2012, 7:10 AM

## 2012-12-06 NOTE — Consult Note (Signed)
ANTICOAGULATION CONSULT NOTE - Initial Consult  Pharmacy Consult for Heparin Indication: embolic stroke  Allergies  Allergen Reactions  . Septra [Sulfamethoxazole-Tmp Ds] Rash and Other (See Comments)    High fever, Burning skin  . Ampicillin Itching  . Fentanyl And Related Hives    Per Dr. Acey Lav, pt reports she is ok with fentanyl (09/10/12). Patient states it was epidural form of Fentanyl that she reacted to.   . Neosporin [Neomycin-Bacitracin Zn-Polymyx] Swelling and Other (See Comments)    opthalmic solution only can use topically. Swelling and inflamed eyes    Patient Measurements: Height: 5\' 7"  (170.2 cm) Weight: 153 lb 7 oz (69.6 kg) IBW/kg (Calculated) : 61.6 Heparin Dosing Weight: 69kg  Vital Signs: Temp: 97.8 F (36.6 C) (08/15 0801) Temp src: Oral (08/15 0801) BP: 110/58 mmHg (08/15 0800) Pulse Rate: 75 (08/15 0800)  Labs:  Recent Labs  12/04/12 1220  12/04/12 2240 12/05/12 0455 12/05/12 1155 12/05/12 1459 12/05/12 2222 12/06/12 0430 12/06/12 0757  HGB 11.2*  --   --  10.8* 11.3*  --   --   --   --   HCT 34.0*  --   --  32.9* 33.7*  --   --   --   --   PLT 120*  --   --  108* 103*  --   --   --   --   APTT 36  --   --   --   --  31 38*  --  50*  LABPROT 25.2*  --   --   --   --   --   --   --   --   INR 2.38*  --   --   --   --   --   --   --   --   HEPARINUNFRC  --   --   --   --   --  0.13* 0.18*  --  0.21*  CREATININE 0.59  --   --  0.60 0.61  --   --   --   --   TROPONINI 0.52*  < > 1.00* 1.11*  --   --   --  1.30*  --   < > = values in this interval not displayed.  Estimated Creatinine Clearance: 68.2 ml/min (by C-G formula based on Cr of 0.61).  Assessment: HL 0.21 --xarelto pta most likely not interfering with result (would expect much higher Xa level) no bleeding or infusion related issues. APTT is 50 seconds   Goal of Therapy:  APTT 60-85s Heparin level 0.3-0.5 units/ml Monitor platelets by anticoagulation protocol: Yes   Plan:    Increase to 1150 units/hr and recheck HL and aPTT  in  6 hours   Mickeal Skinner 12/06/2012,8:58 AM

## 2012-12-06 NOTE — Evaluation (Signed)
Physical Therapy Evaluation Patient Details Name: Monique [name] MRN: [id number] DOB: 1946/04/02 Today's Date: 12/06/2012 Time: 1029-1040 PT Time Calculation (min): 11 min  PT Assessment / Plan / Recommendation History of Present Illness  Monique [name] is an 67 y.o. female with a past medical history significant for ovarian cancer currently undergoing chemotherapy with gemcitabine, newly diagnosed systolic heart failure with EF 15%, chronic anticoagulation on xarelto due to bilateral DVT s/p IVC filter placement, admitted to [facility] with new onset dysarthria. MRI-DWI disclosed scattered. small acute non hemorrhagic infarcts involving portions of the left sub insular region, left frontal lobe, left parietal lobe, occipital lobe bilaterally and cerebellum bilaterally.     Clinical Impression  Pt doing well with mobility and no further PT needed.  Encouraged pt to amb with staff or on her own (once lines are manageable).    PT Assessment  Patent does not need any further PT services    Follow Up Recommendations   (Explained to pt to resume mobility at home.)    Does the patient have the potential to tolerate intense rehabilitation      Barriers to Discharge        Equipment Recommendations  None recommended by PT    Recommendations for Other Services     Frequency      Precautions / Restrictions Precautions Precautions: None Restrictions Weight Bearing Restrictions: No   Pertinent Vitals/Pain VSS      Mobility  Bed Mobility Bed Mobility: Supine to Sit;Sitting - Scoot to Edge of Bed;Sit to Supine Supine to Sit: 7: Independent Sitting - Scoot to Edge of Bed: 7: Independent Sit to Supine: 7: Independent Transfers Transfers: Sit to Stand;Stand to Sit Sit to Stand: 7: Independent Stand to Sit: 7: Independent Ambulation/Gait Ambulation/Gait Assistance: 7: Independent (except lines) Ambulation Distance (Feet): 300 Feet Gait Pattern: Within Functional Limits    Exercises     PT  Diagnosis:    PT Problem List:   PT Treatment Interventions:       PT Goals(Current goals can be found in the care plan section) Acute Rehab PT Goals PT Goal Formulation: No goals set, d/c therapy  Visit Information  Last PT Received On: 12/06/12 Assistance Needed: +1 History of Present Illness: Monique [name] is an 67 y.o. female with a past medical history significant for ovarian cancer currently undergoing chemotherapy with gemcitabine, newly diagnosed systolic heart failure with EF 15%, chronic anticoagulation on xarelto due to bilateral DVT s/p IVC filter placement, admitted to [facility] with new onset dysarthria. MRI-DWI disclosed scattered. small acute non hemorrhagic infarcts involving portions of the left sub insular region, left frontal lobe, left parietal lobe, occipital lobe bilaterally and cerebellum bilaterally.          Prior Functioning  Home Living Family/patient expects to be discharged to:: Private residence Living Arrangements: Alone Available Help at Discharge: Family Type of Home: House Home Access: Level entry Home Layout: One level Home Equipment: None  Lives With: Significant other Prior Function Level of Independence: Independent Communication Communication: Other (comment) (dysarthric)    Cognition  Cognition Arousal/Alertness: Awake/alert Behavior During Therapy: WFL for tasks assessed/performed Overall Cognitive Status: Within Functional Limits for tasks assessed    Extremity/Trunk Assessment Upper Extremity Assessment Upper Extremity Assessment: Defer to OT evaluation Lower Extremity Assessment Lower Extremity Assessment: Overall WFL for tasks assessed   Balance Balance Balance Assessed: Yes Static Standing Balance Static Standing - Balance Support: No upper extremity supported Static Standing - Level of Assistance: 7: Independent  Dynamic Standing Balance Dynamic Standing - Balance Support: No upper extremity supported Dynamic Standing - Level of  Assistance: 7: Independent  End of Session PT - End of Session Activity Tolerance: Patient tolerated treatment well Patient left: in bed;with call bell/phone within reach Nurse Communication: Mobility status  GP     [facility] 12/06/2012, 11:19 AM  [facility] PT [phone]

## 2012-12-06 NOTE — Progress Notes (Signed)
CRITICAL VALUE ALERT  Critical value received: Troponin 1.30  Date of notification:  12/06/12  Time of notification:  0530  Critical value read back: Troponin 1.30  Nurse who received alert:  Arleta Creek, RN  MD notified (1st page):  Adline Peals, Neuro  Time of first page:  0530  MD notified (2nd page): Mendel Ryder, Cardiology  Time of second page:0530  Responding MD: Katrinka Blazing  Time MD responded:  0530

## 2012-12-06 NOTE — Progress Notes (Signed)
Stroke Team Progress Note  HISTORY Monique [name] is an 67 y.o. female with a past medical history significant for ovarian cancer currently undergoing chemotherapy with gemcitabine, newly diagnosed systolic heart failure with EF 15%, chronic anticoagulation on xarelto due to bilateral DVT s/p IVC filter placement, admitted to [facility] with new onset dysarthria. She stated that she went to bed previous night feeling well, but approximately 1 hour after waking up on 12/05/2011 noticed that her speech was slurred and she couldn't talk properly. Never had similar symptoms before.  She denies associated headache, vertigo, double vision, difficulty swallowing, confusion, or unsteadiness.  She presented to [facility] ED for further evaluation and management but was out of the window for intravenous thrombolysis.  CT brain upon arrival to ED showed no acute abnormality, but a subsequent MRI-DWI disclosed scattered. small acute non hemorrhagic infarcts involving portions of the left sub insular region, left frontal lobe, left parietal lobe, occipital lobe bilaterally and cerebellum bilaterally.  She is on xarelto.  Patient was noted early in the morning of 12/05/12 to have increased aphasia, right arm weakness and difficulty ambulating. repeat CT head showed hemorrhage left occipital lobe suggests hemorrhagic transformation. Patient was transferred to [facility] to 3100 to be placed on the stroke service. Patients Xarelto was held.   Patient was not a TPA candidate secondary to out of window. She was admitted to the neuro ICU for further evaluation and treatment.  SUBJECTIVE  Patient lying in bed. Aphasic. Husband at bedside.   OBJECTIVE Most recent Vital Signs: Filed Vitals:   12/06/12 0430 12/06/12 0500 12/06/12 0600 12/06/12 0700  BP:  97/47  105/56  Pulse:  64 75 69  Temp: 98.2 F (36.8 C)     TempSrc: Oral     Resp:  17 17 18   Height:      Weight:  69.6 kg (153 lb 7 oz)    SpO2:  96% 97% 97%   CBG (last 3)    Recent Labs  12/05/12 1224 12/05/12 1702 12/05/12 2343  GLUCAP 104* 116* 114*    IV Fluid Intake:   . sodium chloride 75 mL/hr at 12/06/12 0700  . heparin 1,000 Units/hr (12/06/12 0700)    MEDICATIONS  . hyoscyamine  0.125 mg Oral Q24H  . multivitamin with minerals  1 tablet Oral Daily  . pantoprazole  40 mg Oral Daily   PRN:  acetaminophen, acetaminophen, labetalol, LORazepam  Diet:  Carb Control thin liquids Activity:  Bedrest DVT Prophylaxis:  IV heparin infusion  CLINICALLY SIGNIFICANT STUDIES Basic Metabolic Panel:  Recent Labs Lab 12/05/12 0455 12/05/12 1155  NA 134* 137  K 3.8 4.1  CL 102 101  CO2 26 26  GLUCOSE 100* 98  BUN 13 12  CREATININE 0.60 0.61  CALCIUM 9.2 9.7   Liver Function Tests:  Recent Labs Lab 12/04/12 1220 12/05/12 1155  AST 23 29  ALT 18 18  ALKPHOS 108 111  BILITOT 0.5 0.6  PROT 6.7 6.4  ALBUMIN 3.5 3.4*   CBC:  Recent Labs Lab 12/02/12 0843  12/04/12 1220 12/05/12 0455 12/05/12 1155  WBC 6.1  < > 5.4 5.2 5.0  NEUTROABS 4.0  --  4.6  --   --   HGB 11.5*  < > 11.2* 10.8* 11.3*  HCT 35.9  < > 34.0* 32.9* 33.7*  MCV 92.3  < > 90.2 90.1 89.9  PLT 138*  < > 120* 108* 103*  < > = values in this interval not displayed. Coagulation:  Recent Labs Lab 12/04/12 1220  LABPROT 25.2*  INR 2.38*   Cardiac Enzymes:  Recent Labs Lab 12/04/12 2240 12/05/12 0455 12/06/12 0430  TROPONINI 1.00* 1.11* 1.30*   Urinalysis:  Recent Labs Lab 12/04/12 1318  COLORURINE YELLOW  LABSPEC 1.022  PHURINE 6.5  GLUCOSEU NEGATIVE  HGBUR MODERATE*  BILIRUBINUR NEGATIVE  KETONESUR NEGATIVE  PROTEINUR NEGATIVE  UROBILINOGEN 0.2  NITRITE NEGATIVE  LEUKOCYTESUR NEGATIVE   Lipid Panel    Component Value Date/Time   CHOL 148 12/05/2012 0455   TRIG 89 12/05/2012 0455   HDL 43 12/05/2012 0455   CHOLHDL 3.4 12/05/2012 0455   VLDL 18 12/05/2012 0455   LDLCALC 87 12/05/2012 0455   HgbA1C  Lab Results  Component Value Date   HGBA1C  5.4 12/04/2012    Urine Drug Screen:   No results found for this basename: labopia, cocainscrnur, labbenz, amphetmu, thcu, labbarb    Alcohol Level: No results found for this basename: ETH,  in the last 168 hours  Ct Head Wo Contrast 12/06/2012 :  1. Slightly decreased hemorrhage within the medial left occipital lobe. 2.  No new focus of intracranial hemorrhage.   12/05/2012   Hemorrhage left occipital lobe suggests hemorrhagic transformation of infarct in this region.  Scattered infarcts supratentorially (greater on the left) and within the cerebellum noted but better visualized on recent MR. It is difficult by CT examination to determine if there has been extension of these infarcts.  12/04/2012    Ventricular enlargement without corresponding sulcal prominence. 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  No mass, hemorrhage, or acute appearing infarct.  No gray-white compartment lesions are identified.     Mr Angiogram Head Wo Contrast 12/04/2012    : Anterior circulation without medium or large size vessel significant stenosis or occlusion.  Fetal type contribution to the posterior cerebral arteries.  Ectatic vertebral arteries and basilar artery without high-grade stenosis.  Non-visualized right PICA and left AICA with narrowing and irregularity of the left PICA and right AICA.  Slightly bulbous appearance of the basilar tip and superior medial margin right posterior cerebral artery P1 segment without definitive saccular aneurysm.  Duplicated right superior cerebellar artery.  Anterior and posterior branch vessel irregularity.    Mr Brain Wo Contrast 12/05/2012    Numerous small acute infarcts bilaterally with progression since yesterday.  These findings suggest recurrent emboli.  Hemorrhage in the left occipital infarct is best seen on the CT earlier today.  Ventricular enlargement stable.  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  12/04/2012    Scattered acute non hemorrhagic  infarcts involving portions of the left sub insular region, left frontal lobe, left parietal lobe, occipital lobe bilaterally and cerebellum bilaterally.  This distribution raises possibility embolic disease.  No intracranial hemorrhage.  Scattered nonspecific white matter type changes may reflect result of small vessel disease.  Atrophy.  Ventricular prominence may be related to atrophy although not able to completely exclude a mild component hydrocephalus.   CT of the brain    2D Echocardiogram  EF 15%, left atrium moderately dilated  Carotid Doppler  The vertebral arteries appear patent with antegrade flow.- Findings consistent with less than 39 percent stenosis involving the right internal carotid artery and the left internal carotid artery.  CXR    EKG  normal sinus rhythm.   Therapy Recommendations   Physical Exam   Pleasant elderly Caucasian lady not in distress.Awake alert. Afebrile. Head is nontraumatic. Neck is supple without bruit. Hearing is normal. Cardiac exam no murmur  or gallop. Lungs are clear to auscultation. Distal pulses are well felt. Neurological Exam : Awake alert oriented x2. Diminished attention and recall. severe dysarthria with word hesitancy and word finding difficulty but surprisingly able to write and complicate well with written language. She has very mild naming difficulties but good comprehension and repetition. Extraocular movements are full range without nystagmus. Face is mild right lower facial weakness. Tongue is midline. Motor system exam reveals no upper or lower extremities. Moves all her extremities well against gravity with mild weakness of right grip and intensity muscles. Gait was not tested. Touch temperature sensation and coordination symmetric. ASSESSMENT [name] is a 67 y.o. female presenting with severe dysarthria. Imaging confirms a scattered acute non hemorrhagic infarcts involving portions of the left sub insular region, left frontal  lobe, left parietal lobe, occipital lobe bilaterally and cerebellum bilaterally. Infarct felt to be embolic secondary to hypercoaguability or cardioembolic from low ejection fraction..  On aspirin 81 mg orally every day and xarelto prior to admission. Now on heparin for secondary stroke prevention. Patient with resultant exxpressive aphasia. Work up underway.   DVT history and placed on xarelto  Cardiomyopathy, EF 15%, unknown cause (cardiotoxicity w/chemo)  Ovarian cancer (Lap for debulking) with hypercoaguability  Long term medication use  Acute on chronic systolic heart failure, Dr. [facility] day # 2  TREATMENT/PLAN  Continue heparin for secondary stroke prevention.  Will call her oncologist, Dr. Karel Jarvis to discuss antithrombotic choice.  Await OT/PT/Speech evaluation  Transfer out of unit.  ADDENDUM:  Patient had several beats of VTach > 10 beats per run. Transient right eye visual changes. Patient will stay in unit. Stat BMET and MG ordered.  Dr. Pearlean Brownie has spoken to oncologist on call (Dr. Elfredia Nevins) regarding anticoagulation in this patient. For primary hypercoagulability, full dose lovenox is indicated; however caution for increased risk of hemorrhagic risk in acute stroke setting per literature and iv heparin/warfarin may be safer.. Patient will need to stay on heparin and start coumadin load. Once INR reaches goal (2.0) or higher, stop heparin. Unless other issues, the patient can be discharged only when meeting this goal.  I have had a 20 minute discussion with the patient and husband re: coumadin, risks of bleeding, goals of care. Agreeable to start.   Gwendolyn Lima. Manson Passey, [facility], MBA, MHA [facility] Pager: [phone] 12/06/2012 4:14 PM  I have personally obtained a history, examined the patient, evaluated imaging results, and formulated the assessment and plan of care. I agree with the above. Delia Heady, MD

## 2012-12-06 NOTE — Consult Note (Addendum)
ANTICOAGULATION CONSULT NOTE  Pharmacy Consult for Heparin Indication: embolic stroke  Allergies  Allergen Reactions  . Septra [Sulfamethoxazole-Tmp Ds] Rash and Other (See Comments)    High fever, Burning skin  . Ampicillin Itching  . Fentanyl And Related Hives    Per Dr. Acey Lav, pt reports she is ok with fentanyl (09/10/12). Patient states it was epidural form of Fentanyl that she reacted to.   . Neosporin [Neomycin-Bacitracin Zn-Polymyx] Swelling and Other (See Comments)    opthalmic solution only can use topically. Swelling and inflamed eyes    Patient Measurements: Height: 5\' 7"  (170.2 cm) Weight: 153 lb 7 oz (69.6 kg) IBW/kg (Calculated) : 61.6 Heparin Dosing Weight: 69kg  Vital Signs: Temp: 98.8 F (37.1 C) (08/15 1555) Temp src: Oral (08/15 1555) BP: 108/68 mmHg (08/15 1200) Pulse Rate: 67 (08/15 1200)  Labs:  Recent Labs  12/04/12 1220  12/04/12 2240 12/05/12 0455 12/05/12 1155  12/05/12 2222 12/06/12 0430 12/06/12 0757 12/06/12 1100 12/06/12 1451  HGB 11.2*  --   --  10.8* 11.3*  --   --   --   --   --   --   HCT 34.0*  --   --  32.9* 33.7*  --   --   --   --   --   --   PLT 120*  --   --  108* 103*  --   --   --   --   --   --   APTT 36  --   --   --   --   < > 38*  --  50*  --  55*  LABPROT 25.2*  --   --   --   --   --   --   --   --   --   --   INR 2.38*  --   --   --   --   --   --   --   --   --   --   HEPARINUNFRC  --   --   --   --   --   < > 0.18*  --  0.21*  --  0.27*  CREATININE 0.59  --   --  0.60 0.61  --   --   --   --  0.54  --   TROPONINI 0.52*  < > 1.00* 1.11*  --   --   --  1.30*  --   --   --   < > = values in this interval not displayed.  Estimated Creatinine Clearance: 68.2 ml/min (by C-G formula based on Cr of 0.54).  Assessment: 66yof with ovarian CA currently undergoing chemotherapy was on xarelto pta for history of DVTs. She was admitted with slurred speech and xarelto is on hold.   Heparin level is 0.27 and aPTT is 55 on  heparin 1150 units/hr. The heparin level has increased from 0.21 to 0.27- the influence of Xarelto should be minimal.  Goal of Therapy:  Heparin level 0.3-0.5 units/ml Monitor platelets by anticoagulation protocol: Yes   Plan:  -Discontinue aPTTs -Increase heparin to 1250 units/hr  -Heparin level and CBC in am  Harland German, Pharm D 12/06/2012 4:15 PM  Addendum: Coumadin -last dose of Xarelto 8/13 in am -INR today= 1.05. Of note INR on 12/04/12 was  2.38 (elevation secondary to Xarelto and does not reflect a reliable value)  Goals INR= 2.0 per MD  Plan -Coumadin 5mg  po today -  Daily PT/INR -Will begin education process  Earnest Bailey D 12/06/2012 4:41 PM

## 2012-12-07 DIAGNOSIS — I5043 Acute on chronic combined systolic (congestive) and diastolic (congestive) heart failure: Secondary | ICD-10-CM

## 2012-12-07 LAB — CBC
HCT: 31.8 % — ABNORMAL LOW (ref 36.0–46.0)
MCH: 29.9 pg (ref 26.0–34.0)
MCV: 89.6 fL (ref 78.0–100.0)
Platelets: 109 10*3/uL — ABNORMAL LOW (ref 150–400)
RDW: 15.4 % (ref 11.5–15.5)

## 2012-12-07 LAB — BASIC METABOLIC PANEL
CO2: 22 mEq/L (ref 19–32)
Calcium: 9.6 mg/dL (ref 8.4–10.5)
Chloride: 105 mEq/L (ref 96–112)
GFR calc Af Amer: >90 mL/min (ref >90)
Sodium: 138 mEq/L (ref 135–145)

## 2012-12-07 LAB — HEPARIN LEVEL (UNFRACTIONATED): Heparin Unfractionated: 0.34 IU/mL (ref 0.30–0.70)

## 2012-12-07 LAB — GLUCOSE, CAPILLARY
Glucose-Capillary: 97 mg/dL (ref 70–99)
Glucose-Capillary: 94 mg/dL (ref 70–99)

## 2012-12-07 MED ORDER — HYOSCYAMINE SULFATE 0.125 MG SL SUBL
0.1250 mg | SUBLINGUAL_TABLET | Freq: Every day | SUBLINGUAL | Status: DC
  Administered 2012-12-07: 0.125 mg via SUBLINGUAL
  Filled 2012-12-07 (×2): qty 1

## 2012-12-07 MED ORDER — WARFARIN SODIUM 5 MG PO TABS
5.0000 mg | ORAL_TABLET | Freq: Once | ORAL | Status: AC
  Administered 2012-12-07: 5 mg via ORAL
  Filled 2012-12-07: qty 1

## 2012-12-07 NOTE — Progress Notes (Signed)
ANTICOAGULATION CONSULT NOTE - Follow Up Consult  Pharmacy Consult for Heparin and Coumadin Indication: embolic stroke  Allergies  Allergen Reactions  . Septra [Sulfamethoxazole-Tmp Ds] Rash and Other (See Comments)    High fever, Burning skin  . Ampicillin Itching  . Fentanyl And Related Hives    Per Dr. Acey Lav, pt reports she is ok with fentanyl (09/10/12). Patient states it was epidural form of Fentanyl that she reacted to.   . Neosporin [Neomycin-Bacitracin Zn-Polymyx] Swelling and Other (See Comments)    opthalmic solution only can use topically. Swelling and inflamed eyes    Patient Measurements: Height: 5\' 7"  (170.2 cm) Weight: 149 lb 4 oz (67.699 kg) IBW/kg (Calculated) : 61.6 Heparin Dosing Weight: 69kg  Vital Signs: Temp: 98.1 F (36.7 C) (08/16 0145) Temp src: Oral (08/16 0145) BP: 98/42 mmHg (08/16 0145) Pulse Rate: 56 (08/16 0145)  Labs:  Recent Labs  12/04/12 1220  12/04/12 2240 12/05/12 0455 12/05/12 1155  12/05/12 2222 12/06/12 0430 12/06/12 0757 12/06/12 1100 12/06/12 1451 12/07/12 0600  HGB 11.2*  --   --  10.8* 11.3*  --   --   --   --   --   --  10.6*  HCT 34.0*  --   --  32.9* 33.7*  --   --   --   --   --   --  31.8*  PLT 120*  --   --  108* 103*  --   --   --   --   --   --  109*  APTT 36  --   --   --   --   < > 38*  --  50*  --  55*  --   LABPROT 25.2*  --   --   --   --   --   --   --   --   --  13.5 13.7  INR 2.38*  --   --   --   --   --   --   --   --   --  1.05 1.07  HEPARINUNFRC  --   --   --   --   --   < > 0.18*  --  0.21*  --  0.27* 0.37  CREATININE 0.59  --   --  0.60 0.61  --   --   --   --  0.54  --   --   TROPONINI 0.52*  < > 1.00* 1.11*  --   --   --  1.30*  --   --   --   --   < > = values in this interval not displayed.  Estimated Creatinine Clearance: 68.2 ml/min (by C-G formula based on Cr of 0.54).   Medications:  Heparin 1250 units/hr  Assessment: 66yof on heparin bridging to Coumadin for embolic stroke.  Heparin level (0.37) is now therapeutic - continue current rate and check follow-up level to confirm therapeutic. INR (1.07) is subtherapeutic as expected - repeat Coumadin 5mg  and follow-up AM INR. - H/H trended down, Plts stable - No significant bleeding reported  Goal of Therapy:  Heparin level 0.3-0.5 units/ml INR ~2 per Neuro Monitor platelets by anticoagulation protocol: Yes   Plan:  1. Continue heparin drip 1250 units/hr (12.5 ml/hr) 2. Repeat Coumadin 5mg  po x 1 today 3. Check heparin level @ 1300 to confirm therapeutic 4. Follow-up AM INR and Neuro plans  Cleon Dew [phone] 12/07/2012,7:22 AM

## 2012-12-07 NOTE — Progress Notes (Signed)
Patient ID: NEGAR SIELER, female   DOB: 01/02/1946, 67 y.o.   MRN: [id number] Patient Name: TATISHA CERINO Date of Encounter: 12/07/2012    SUBJECTIVE: Somewhat hard to understand due to aphasia.  No dyspnea  TELEMETRY:  Normal sinus rhythm: Filed Vitals:   12/06/12 2129 12/07/12 0145 12/07/12 0500 12/07/12 0503  BP: 105/61 98/42  101/71  Pulse: 76 56  68  Temp: 98.7 F (37.1 C) 98.1 F (36.7 C)  97.8 F (36.6 C)  TempSrc: Oral Oral  Oral  Resp: 18 16  18   Height:      Weight:   149 lb 4 oz (67.699 kg)   SpO2: 99% 95%  100%    Intake/Output Summary (Last 24 hours) at 12/07/12 1124 Last data filed at 12/06/12 1700  Gross per 24 hour  Intake 519.42 ml  Output      0 ml  Net 519.42 ml    LABS: Basic Metabolic Panel:  Recent Labs  78/29/56 1155 12/06/12 1100  NA 137 137  K 4.1 3.4*  CL 101 103  CO2 26 24  GLUCOSE 98 109*  BUN 12 12  CREATININE 0.61 0.54  CALCIUM 9.7 9.2  MG  --  1.9   CBC:  Recent Labs  12/04/12 1220  12/05/12 1155 12/07/12 0600  WBC 5.4  < > 5.0 4.5  NEUTROABS 4.6  --   --   --   HGB 11.2*  < > 11.3* 10.6*  HCT 34.0*  < > 33.7* 31.8*  MCV 90.2  < > 89.9 89.6  PLT 120*  < > 103* 109*  < > = values in this interval not displayed. Cardiac Enzymes:  Recent Labs  12/04/12 2240 12/05/12 0455 12/06/12 0430  TROPONINI 1.00* 1.11* 1.30*   BNP    Component Value Date/Time   PROBNP 1659.0* 12/05/2012 1417   Hemoglobin A1C:  Recent Labs  12/04/12 1220  HGBA1C 5.4   Fasting Lipid Panel:  Recent Labs  12/05/12 0455  CHOL 148  HDL 43  LDLCALC 87  TRIG 89  CHOLHDL 3.4    Radiology/Studies:   the chest x-ray this admission   Physical Exam: Blood pressure 101/71, pulse 68, temperature 97.8 F (36.6 C), temperature source Oral, resp. rate 18, height 5\' 7"  (1.702 m), weight 149 lb 4 oz (67.699 kg), SpO2 100.00%. Weight change: -2 lb 6.8 oz (-1.101 kg)    Pior clinical exams have not demonstrated gallop or neck vein elevation.  Again she is in CT scan of this morning.  ASSESSMENT:  1. Systolic heart failure, acute on chronic with elevated BNP now 3 times higher than the outpatient value and elevated troponins. I suspect this patient has cardiotoxicity from chemotherapy (Taxol, versus other). It is remotely possible that she could have coronary disease although the contractile pattern does not suggest coronary disease/infarction.  2. Relatively low blood pressure, limiting our ability to institute heart failure therapy.  Plan:  Continue aldactone  For EF 15% and elevated BNP  Coumadin stopped due to hemorrhagic stroke  BP still borderline for ACE  Consider  Starting in 48-72 hours more remote from acute stroke  Vira Blanco 12/07/2012, 11:24 AM

## 2012-12-07 NOTE — Progress Notes (Signed)
ANTICOAGULATION CONSULT NOTE - Follow Up Consult  Pharmacy Consult for Heparin and Coumadin Indication: embolic stroke  Patient Measurements: Height: 5\' 7"  (170.2 cm) Weight: 149 lb 4 oz (67.699 kg) IBW/kg (Calculated) : 61.6 Heparin Dosing Weight: 69kg  Vital Signs: Temp: 97.9 F (36.6 C) (08/16 1032) Temp src: Oral (08/16 1032) BP: 113/52 mmHg (08/16 1032) Pulse Rate: 76 (08/16 1032)  Labs:  Recent Labs  12/04/12 2240  12/05/12 0455 12/05/12 1155  12/05/12 2222 12/06/12 0430 12/06/12 0757 12/06/12 1100 12/06/12 1451 12/07/12 0600 12/07/12 1018 12/07/12 1240  HGB  --   < > 10.8* 11.3*  --   --   --   --   --   --  10.6*  --   --   HCT  --   --  32.9* 33.7*  --   --   --   --   --   --  31.8*  --   --   PLT  --   --  108* 103*  --   --   --   --   --   --  109*  --   --   APTT  --   --   --   --   < > 38*  --  50*  --  55*  --   --   --   LABPROT  --   --   --   --   --   --   --   --   --  13.5 13.7  --   --   INR  --   --   --   --   --   --   --   --   --  1.05 1.07  --   --   HEPARINUNFRC  --   --   --   --   < > 0.18*  --  0.21*  --  0.27* 0.37  --  0.34  CREATININE  --   < > 0.60 0.61  --   --   --   --  0.54  --   --  0.60  --   TROPONINI 1.00*  --  1.11*  --   --   --  1.30*  --   --   --   --   --   --   < > = values in this interval not displayed.  Estimated Creatinine Clearance: 68.2 ml/min (by C-G formula based on Cr of 0.6).   Medications:  Heparin 1250 units/hr  Assessment: 66yof with hx dvt on xarelto now on heparin bridging to Coumadin for embolic stroke. Heparin level (0.34) is now therapeutic and stable - continue current rate. INR (1.07) is subtherapeutic as expected - repeat Coumadin 5mg  and follow-up AM INR. - H/H trended down, Plts stable - No significant bleeding reported - 8/15 CT head - slight decreased hemorrhage w/in medial left occipital lobe/ no new ICH  Goal of Therapy:  Heparin level 0.3-0.5 units/ml INR ~2 per Neuro Monitor  platelets by anticoagulation protocol: Yes   Plan:  1. Continue heparin drip 1250 units/hr (12.5 ml/hr) 2. Repeat Coumadin 5mg  po x 1 today 3. Follow-up AM INR and Neuro plans  Sheppard Coil PharmD., BCPS Clinical Pharmacist Pager [phone] 12/07/2012 2:51 PM

## 2012-12-07 NOTE — Progress Notes (Signed)
Stroke Team Progress Note  HISTORY Monique [name] is an 67 y.o. female with a past medical history significant for ovarian cancer currently undergoing chemotherapy with gemcitabine, newly diagnosed systolic heart failure with EF 15%, chronic anticoagulation on xarelto due to bilateral DVT s/p IVC filter placement, admitted to [facility] with new onset dysarthria. She stated that she went to bed on the evening of 12/03/2012 feeling well, but approximately 1 hour after waking up on 12/04/2012 noticed that her speech was slurred and she couldn't talk properly. Never had similar symptoms before.  She denied associated headache, vertigo, double vision, difficulty swallowing, confusion, or unsteadiness.  She presented to [facility] ED for further evaluation and management but was out of the window for intravenous thrombolysis.  CT brain upon arrival to ED showed no acute abnormality, but a subsequent MRI-DWI disclosed scattered. small acute non hemorrhagic infarcts involving portions of the left sub insular region, left frontal lobe, left parietal lobe, occipital lobe bilaterally and cerebellum bilaterally.  She was on xarelto prior to admission. Patient was noted early in the morning of 12/05/12 to have increased aphasia, right arm weakness and difficulty ambulating. repeat CT head showed hemorrhage left occipital lobe suggests hemorrhagic transformation. Patient was transferred to [facility] to 3100 to be placed on the stroke service. Patients Xarelto was held.   Patient was not a TPA candidate secondary to out of window. She was admitted to the neuro ICU for further evaluation and treatment.  SUBJECTIVE There no family members present this morning. The patient is aphasic but is able to converse and make herself understood. She feels she is somewhat improved.   OBJECTIVE Most recent Vital Signs: Filed Vitals:   12/06/12 2129 12/07/12 0145 12/07/12 0500 12/07/12 0503  BP: 105/61 98/42  101/71  Pulse: 76 56  68  Temp: 98.7 F  (37.1 C) 98.1 F (36.7 C)  97.8 F (36.6 C)  TempSrc: Oral Oral  Oral  Resp: 18 16  18   Height:      Weight:   67.699 kg (149 lb 4 oz)   SpO2: 99% 95%  100%   CBG (last 3)   Recent Labs  12/06/12 1652 12/06/12 2211 12/07/12 0702  GLUCAP 102* 90 102*    IV Fluid Intake:   . sodium chloride 75 mL/hr at 12/07/12 0459  . heparin 1,250 Units/hr (12/06/12 1700)    MEDICATIONS  . hyoscyamine  0.125 mg Oral Q24H  . multivitamin with minerals  1 tablet Oral Daily  . pantoprazole  40 mg Oral Daily  . spironolactone  25 mg Oral Daily  . warfarin  5 mg Oral ONCE-1800  . warfarin   Does not apply Once  . Warfarin - Pharmacist Dosing Inpatient   Does not apply q1800   PRN:  acetaminophen, acetaminophen, labetalol, LORazepam  Diet:  Carb Control thin liquids Activity:  Bedrest DVT Prophylaxis:  IV heparin infusion  CLINICALLY SIGNIFICANT STUDIES Basic Metabolic Panel:   Recent Labs Lab 12/05/12 1155 12/06/12 1100  NA 137 137  K 4.1 3.4*  CL 101 103  CO2 26 24  GLUCOSE 98 109*  BUN 12 12  CREATININE 0.61 0.54  CALCIUM 9.7 9.2  MG  --  1.9   Liver Function Tests:   Recent Labs Lab 12/04/12 1220 12/05/12 1155  AST 23 29  ALT 18 18  ALKPHOS 108 111  BILITOT 0.5 0.6  PROT 6.7 6.4  ALBUMIN 3.5 3.4*   CBC:  Recent Labs Lab 12/02/12 0843 12/04/12 1220  12/05/12 1155 12/07/12 0600  WBC 6.1 5.4  < > 5.0 4.5  NEUTROABS 4.0 4.6  --   --   --   HGB 11.5* 11.2*  < > 11.3* 10.6*  HCT 35.9 34.0*  < > 33.7* 31.8*  MCV 92.3 90.2  < > 89.9 89.6  PLT 138* 120*  < > 103* 109*  < > = values in this interval not displayed. Coagulation:   Recent Labs Lab 12/04/12 1220 12/06/12 1451 12/07/12 0600  LABPROT 25.2* 13.5 13.7  INR 2.38* 1.05 1.07   Cardiac Enzymes:   Recent Labs Lab 12/04/12 2240 12/05/12 0455 12/06/12 0430  TROPONINI 1.00* 1.11* 1.30*   Urinalysis:   Recent Labs Lab 12/04/12 1318  COLORURINE YELLOW  LABSPEC 1.022  PHURINE 6.5   GLUCOSEU NEGATIVE  HGBUR MODERATE*  BILIRUBINUR NEGATIVE  KETONESUR NEGATIVE  PROTEINUR NEGATIVE  UROBILINOGEN 0.2  NITRITE NEGATIVE  LEUKOCYTESUR NEGATIVE   Lipid Panel    Component Value Date/Time   CHOL 148 12/05/2012 0455   TRIG 89 12/05/2012 0455   HDL 43 12/05/2012 0455   CHOLHDL 3.4 12/05/2012 0455   VLDL 18 12/05/2012 0455   LDLCALC 87 12/05/2012 0455   HgbA1C  Lab Results  Component Value Date   HGBA1C 5.4 12/04/2012    Urine Drug Screen:   No results found for this basename: labopia,  cocainscrnur,  labbenz,  amphetmu,  thcu,  labbarb    Alcohol Level: No results found for this basename: ETH,  in the last 168 hours  Ct Head Wo Contrast 12/06/2012 :  1. Slightly decreased hemorrhage within the medial left occipital lobe. 2.  No new focus of intracranial hemorrhage.   12/05/2012   Hemorrhage left occipital lobe suggests hemorrhagic transformation of infarct in this region.  Scattered infarcts supratentorially (greater on the left) and within the cerebellum noted but better visualized on recent MR. It is difficult by CT examination to determine if there has been extension of these infarcts.  12/04/2012    Ventricular enlargement without corresponding sulcal prominence. 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  No mass, hemorrhage, or acute appearing infarct.  No gray-white compartment lesions are identified.     Mr Angiogram Head Wo Contrast 12/04/2012    : Anterior circulation without medium or large size vessel significant stenosis or occlusion.  Fetal type contribution to the posterior cerebral arteries.  Ectatic vertebral arteries and basilar artery without high-grade stenosis.  Non-visualized right PICA and left AICA with narrowing and irregularity of the left PICA and right AICA.  Slightly bulbous appearance of the basilar tip and superior medial margin right posterior cerebral artery P1 segment without definitive saccular aneurysm.  Duplicated right  superior cerebellar artery.  Anterior and posterior branch vessel irregularity.    Mr Brain Wo Contrast 12/05/2012    Numerous small acute infarcts bilaterally with progression since yesterday.  These findings suggest recurrent emboli.  Hemorrhage in the left occipital infarct is best seen on the CT earlier today.  Ventricular enlargement stable.  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  12/04/2012    Scattered acute non hemorrhagic infarcts involving portions of the left sub insular region, left frontal lobe, left parietal lobe, occipital lobe bilaterally and cerebellum bilaterally.  This distribution raises possibility embolic disease.  No intracranial hemorrhage.  Scattered nonspecific white matter type changes may reflect result of small vessel disease.  Atrophy.  Ventricular prominence may be related to atrophy although not able to completely exclude a mild component hydrocephalus.  2D Echocardiogram  EF 15%, left atrium moderately dilated  Carotid Doppler  The vertebral arteries appear patent with antegrade flow.- Findings consistent with less than 39 percent stenosis involving the right internal carotid artery and the left internal carotid artery.  CXR  12/06/2012 - 1. Borderline cardiomegaly. 2. Linear subsegmental atelectasis in the left lower lobe.   EKG  normal sinus rhythm.   Therapy Recommendations - no further therapy recommended  Physical Exam   Pleasant elderly Caucasian lady not in distress.Awake alert. Afebrile. Head is nontraumatic. Neck is supple without bruit. Hearing is normal. Cardiac exam no murmur or gallop. Lungs are clear to auscultation. Distal pulses are well felt. Neurological Exam : Awake alert oriented x2. Some word finding difficulties and hesitancy. Was able to name 3 successive objects without hesitation.  Extraocular movements are full range without nystagmus. Face is mild right lower facial weakness. Tongue is midline. Motor system exam reveals no upper or lower  extremities. Moves all her extremities well against gravity with mild weakness of right grip and intensity muscles. Gait was not tested. Touch temperature sensation and coordination symmetric.   ASSESSMENT Monique [name] is a 67 y.o. female presenting with severe dysarthria. Imaging confirms a scattered acute non hemorrhagic infarcts involving portions of the left sub insular region, left frontal lobe, left parietal lobe, occipital lobe bilaterally and cerebellum bilaterally. Infarct felt to be embolic secondary to hypercoaguability or cardioembolic from low ejection fraction..  On aspirin 81 mg orally every day and xarelto prior to admission. Now on heparin for secondary stroke prevention. Patient with resultant exxpressive aphasia. Work up underway.   DVT history on xarelto therapy prior to admission.  Cardiomyopathy, EF 15%, unknown cause (cardiotoxicity w/chemo)  Ovarian cancer (Lap for debulking) with hypercoaguability  Long term medication use  Acute on chronic systolic heart failure, Dr. Mendel Ryder  Hypokalemia  Nonsustained ventricular tachycardia  Hospital day # 3  TREATMENT/PLAN  Continue heparin for secondary stroke prevention.  No further therapies recommended.  Coumadin dosing per pharmacy - INR 1.07 today.  Followup potassium level is 4.0 today.   Dr. Pearlean Brownie spoke to oncologist on call (Dr. Elfredia Nevins) Friday regarding anticoagulation in this patient. For primary hypercoagulability, full dose lovenox is indicated; however caution for increased risk of hemorrhagic risk in acute stroke setting per literature and iv heparin/warfarin may be safer.. Patient will need to stay on heparin and start coumadin load. Once INR reaches goal (2.0) or higher, stop heparin. Unless other issues, the patient can be discharged only when meeting this goal.  Guy Franco PA-C had a 20 minute discussion with the patient and husband re: coumadin, risks of bleeding, and goals of care on Friday. They  were agreeable to start.  Delton See PA-C [facility] Pager [phone] 12/07/2012, 8:52 AM  I have personally obtained a history, examined the patient, evaluated imaging results, and formulated the assessment and plan of care. I agree with the above.  On heparin gtt and coumadin restarted with goal INR 2.0. Dr. Ernesto Rutherford discussed plan with oncology team.   Pauletta Browns

## 2012-12-07 NOTE — Plan of Care (Signed)
Problem: Acute Treatment Outcomes Goal: Airway maintained/protected Outcome: Completed/Met Date Met:  12/07/12 Patient respirations are even and unlabored.  No oxygen needed; maintaining oxygen saturations within limits. Goal: Pain controlled Outcome: Not Applicable Date Met:  12/07/12 Patient denies pain. Goal: Nausea and vomiting controlled Outcome: Not Applicable Date Met:  12/07/12 Patient denies nausea and pain.

## 2012-12-07 NOTE — Evaluation (Signed)
Occupational Therapy Evaluation Patient Details Name: Monique [name] MRN: [id number] DOB: 09/27/1945 Today's Date: 12/07/2012 Time: 4098-1191 OT Time Calculation (min): 11 min  OT Assessment / Plan / Recommendation History of present illness Monique [name] is an 67 y.o. female with a past medical history significant for ovarian cancer currently undergoing chemotherapy with gemcitabine, newly diagnosed systolic heart failure with EF 15%, chronic anticoagulation on xarelto due to bilateral DVT s/p IVC filter placement, admitted to [facility] with new onset dysarthria. MRI-DWI disclosed scattered. small acute non hemorrhagic infarcts involving portions of the left sub insular region, left frontal lobe, left parietal lobe, occipital lobe bilaterally and cerebellum bilaterally.      Clinical Impression   Patient evaluated by Occupational Therapy with no further acute OT needs identified. All education has been completed and the patient has no further questions. See below for any follow-up Occupational Therapy or equipment needs. OT to sign off. Thank you for referral.      OT Assessment  Patient does not need any further OT services    Follow Up Recommendations  No OT follow up    Barriers to Discharge      Equipment Recommendations  None recommended by OT    Recommendations for Other Services    Frequency       Precautions / Restrictions     Pertinent Vitals/Pain none    ADL  Eating/Feeding: Independent Grooming: Shaving Upper Body Bathing: Chest Lower Body Bathing: Independent Upper Body Dressing: Independent Lower Body Dressing: Independent Toilet Transfer: Independent Toileting - Clothing Manipulation and Hygiene: Independent ADL Comments: Pt completed toilet transfer with IV pole independent on arrival. Pt demonstrates ability to push IV pole around small space, stand on one foot and plug the IV back into the wall. pt complete bed mobility. pt don SCD hose independently. Pt unable to  express coumdian so she wrote it on note pad demonstrating fine motor. Pt with speech deficits only.    OT Diagnosis:    OT Problem List:   OT Treatment Interventions:     OT Goals(Current goals can be found in the care plan section)    Visit Information  Last OT Received On: 12/07/12 Assistance Needed: +1 History of Present Illness: Monique [name] is an 67 y.o. female with a past medical history significant for ovarian cancer currently undergoing chemotherapy with gemcitabine, newly diagnosed systolic heart failure with EF 15%, chronic anticoagulation on xarelto due to bilateral DVT s/p IVC filter placement, admitted to [facility] with new onset dysarthria. MRI-DWI disclosed scattered. small acute non hemorrhagic infarcts involving portions of the left sub insular region, left frontal lobe, left parietal lobe, occipital lobe bilaterally and cerebellum bilaterally.          Prior Functioning     Home Living Family/patient expects to be discharged to:: Private residence Living Arrangements: Alone Available Help at Discharge: Family Type of Home: House Home Access: Level entry Home Layout: One level Home Equipment: None  Lives With: Significant other Prior Function Level of Independence: Independent Communication Communication: Other (comment) Dominant Hand: Right         Vision/Perception Vision - History Baseline Vision: Wears glasses all the time Patient Visual Report: No change from baseline Vision - Assessment Vision Assessment: Vision tested   [employer] Arousal/Alertness: Awake/alert Behavior During Therapy: WFL for tasks assessed/performed Overall Cognitive Status: Within Functional Limits for tasks assessed    Extremity/Trunk Assessment Upper Extremity Assessment Upper Extremity Assessment: Defer to OT evaluation;Overall [facility] for tasks  assessed Lower Extremity Assessment Lower Extremity Assessment: Defer to PT evaluation Cervical / Trunk  Assessment Cervical / Trunk Assessment: Normal     Mobility Bed Mobility Supine to Sit: 7: Independent Sitting - Scoot to Edge of Bed: 7: Independent Sit to Supine: 7: Independent Transfers Sit to Stand: 7: Independent Stand to Sit: 7: Independent     Exercise     Balance     End of Session OT - End of Session Activity Tolerance: Patient tolerated treatment well Patient left: in bed;with call bell/phone within reach  GO     Lucile Shutters 12/07/2012, 3:52 PM Pager: [phone]

## 2012-12-08 LAB — PROTIME-INR: Prothrombin Time: 13.4 seconds (ref 11.6–15.2)

## 2012-12-08 LAB — CBC
MCH: 29.9 pg (ref 26.0–34.0)
Platelets: 88 10*3/uL — ABNORMAL LOW (ref 150–400)
RBC: 3.45 MIL/uL — ABNORMAL LOW (ref 3.87–5.11)
RDW: 15.5 % (ref 11.5–15.5)
WBC: 3.5 10*3/uL — ABNORMAL LOW (ref 4.0–10.5)

## 2012-12-08 MED ORDER — HEPARIN (PORCINE) IN NACL 100-0.45 UNIT/ML-% IJ SOLN
1150.0000 [IU]/h | INTRAMUSCULAR | Status: DC
  Administered 2012-12-10 – 2012-12-11 (×2): 1150 [IU]/h via INTRAVENOUS
  Filled 2012-12-08 (×4): qty 250

## 2012-12-08 MED ORDER — LISINOPRIL 2.5 MG PO TABS
2.5000 mg | ORAL_TABLET | Freq: Every day | ORAL | Status: DC
  Administered 2012-12-08 – 2012-12-11 (×4): 2.5 mg via ORAL
  Filled 2012-12-08 (×4): qty 1

## 2012-12-08 MED ORDER — WARFARIN SODIUM 7.5 MG PO TABS
7.5000 mg | ORAL_TABLET | Freq: Once | ORAL | Status: AC
  Administered 2012-12-08: 7.5 mg via ORAL
  Filled 2012-12-08: qty 1

## 2012-12-08 NOTE — Progress Notes (Signed)
  Patient ID: Monique [name], female   DOB: Dec 21, 1945, 67 y.o.   MRN: [id number] Patient Name: Monique [name] Date of Encounter: 12/08/2012    SUBJECTIVE: Somewhat hard to understand due to aphasia.  No dyspnea  TELEMETRY:  Normal sinus rhythm: Filed Vitals:   12/07/12 1800 12/07/12 2113 12/08/12 0500 12/08/12 0600  BP: 114/72 107/57  111/55  Pulse: 82 65  63  Temp: 97.6 F (36.4 C) 97.9 F (36.6 C)  97.9 F (36.6 C)  TempSrc: Oral Oral  Oral  Resp: 16 18  18   Height:      Weight:   149 lb 5 oz (67.728 kg)   SpO2: 99% 96%  94%   No intake or output data in the 24 hours ending 12/08/12 1048  LABS: Basic Metabolic Panel:  Recent Labs  40/98/11 1155 12/06/12 1100 12/07/12 1018  NA 137 137 138  K 4.1 3.4* 4.0  CL 101 103 105  CO2 26 24 22   GLUCOSE 98 109* 99  BUN 12 12 11   CREATININE 0.61 0.54 0.60  CALCIUM 9.7 9.2 9.6  MG  --  1.9  --    CBC:  Recent Labs  12/07/12 0600 12/08/12 0605  WBC 4.5 3.5*  HGB 10.6* 10.3*  HCT 31.8* 31.0*  MCV 89.6 89.9  PLT 109* 88*   Cardiac Enzymes:  Recent Labs  12/06/12 0430  TROPONINI 1.30*   BNP    Component Value Date/Time   PROBNP 1659.0* 12/05/2012 1417    Radiology/Studies:   the chest x-ray this admission   Scheduled Meds: . hyoscyamine  0.125 mg Sublingual Daily  . multivitamin with minerals  1 tablet Oral Daily  . pantoprazole  40 mg Oral Daily  . spironolactone  25 mg Oral Daily  . warfarin   Does not apply Once  . Warfarin - Pharmacist Dosing Inpatient   Does not apply q1800   Continuous Infusions: . sodium chloride 10 mL/hr at 12/08/12 0952  . heparin 1,250 Units/hr (12/08/12 0952)   PRN Meds:.acetaminophen, acetaminophen, labetalol, LORazepam   Physical Exam: Blood pressure 111/55, pulse 63, temperature 97.9 F (36.6 C), temperature source Oral, resp. rate 18, height 5\' 7"  (1.702 m), weight 149 lb 5 oz (67.728 kg), SpO2 94.00%. Weight change: 1 oz (0.028 kg)    Pior clinical exams have not  demonstrated gallop or neck vein elevation. Again she is in CT scan of this morning.  ASSESSMENT:  1. Systolic heart failure, acute on chronic with elevated BNP now 3 times higher than the outpatient value and elevated troponins. I suspect this patient has cardiotoxicity from chemotherapy (Taxol, versus other). It is remotely possible that she could have coronary disease although the contractile pattern does not suggest coronary disease/infarction.  2. Relatively low blood pressure, limiting our ability to institute heart failure therapy.  Plan:  Continue aldactone  For EF 15% and elevated BNP  Xarelto stopped due to hemorrhagic stroke  Now on systemic heparin and  Receiving coumadin.  Started lisinopril 2.5 mg yesterday increase to 5 mg in am if systolic BP remains over 100.  Cardiac MRI may be Useful in etiology of DCM.  I will be available all day tomorrow if Deboraha Sprang wants to order can call me at [phone] 12/08/2012, 10:48 AM

## 2012-12-08 NOTE — Progress Notes (Signed)
ANTICOAGULATION CONSULT NOTE - Follow Up Consult  Pharmacy Consult for Heparin and Coumadin Indication: hx DVT/embolic stroke  Patient Measurements: Height: 5\' 7"  (170.2 cm) Weight: 149 lb 5 oz (67.728 kg) IBW/kg (Calculated) : 61.6 Heparin Dosing Weight: 69kg  Vital Signs: Temp: 97.9 F (36.6 C) (08/17 0600) Temp src: Oral (08/17 0600) BP: 111/55 mmHg (08/17 0600) Pulse Rate: 63 (08/17 0600)  Labs:  Recent Labs  12/05/12 2222 12/06/12 0430 12/06/12 0757 12/06/12 1100 12/06/12 1451 12/07/12 0600 12/07/12 1018 12/07/12 1240 12/08/12 0605  HGB  --   --   --   --   --  10.6*  --   --  10.3*  HCT  --   --   --   --   --  31.8*  --   --  31.0*  PLT  --   --   --   --   --  109*  --   --  88*  APTT 38*  --  50*  --  55*  --   --   --   --   LABPROT  --   --   --   --  13.5 13.7  --   --  13.4  INR  --   --   --   --  1.05 1.07  --   --  1.04  HEPARINUNFRC 0.18*  --  0.21*  --  0.27* 0.37  --  0.34 0.57  CREATININE  --   --   --  0.54  --   --  0.60  --   --   TROPONINI  --  1.30*  --   --   --   --   --   --   --     Estimated Creatinine Clearance: 68.2 ml/min (by C-G formula based on Cr of 0.6).   Medications:  Heparin 1250 units/hr  Assessment: 66yof with hx dvt on xarelto now on heparin bridging to Coumadin for embolic stroke. Heparin level (0.5) continues to be therapeutic but now trending up - will reduce rate slightly. INR (1.0) relatively unchanged - increase Coumadin 7.5mg  and follow-up AM INR. - H/H stable, Plts trending down 88 - No significant bleeding reported - 8/15 CT head - slight decreased hemorrhage w/in medial left occipital lobe/ no new ICH  Goal of Therapy:  Heparin level 0.3-0.5 units/ml INR ~2 per Neuro Monitor platelets by anticoagulation protocol: Yes   Plan:  1. Continue heparin drip 1150 units/hr (11.5 ml/hr) 2. Repeat Coumadin 7.5mg  po x 1 today 3. Follow-up AM INR and Neuro plans  Sheppard Coil PharmD., BCPS Clinical  Pharmacist Pager [phone] 12/08/2012 12:02 PM

## 2012-12-08 NOTE — Progress Notes (Signed)
Stroke Team Progress Note  HISTORY Monique [name] is an 67 y.o. female with a past medical history significant for ovarian cancer currently undergoing chemotherapy with gemcitabine, newly diagnosed systolic heart failure with EF 15%, chronic anticoagulation on xarelto due to bilateral DVT s/p IVC filter placement, admitted to [facility] with new onset dysarthria. She stated that she went to bed on the evening of 12/03/2012 feeling well, but approximately 1 hour after waking up on 12/04/2012 noticed that her speech was slurred and she couldn't talk properly. Never had similar symptoms before.  She denied associated headache, vertigo, double vision, difficulty swallowing, confusion, or unsteadiness.  She presented to [facility] ED for further evaluation and management but was out of the window for intravenous thrombolysis.  CT brain upon arrival to ED showed no acute abnormality, but a subsequent MRI-DWI disclosed scattered. small acute non hemorrhagic infarcts involving portions of the left sub insular region, left frontal lobe, left parietal lobe, occipital lobe bilaterally and cerebellum bilaterally.  She was on xarelto prior to admission. Patient was noted early in the morning of 12/05/12 to have increased aphasia, right arm weakness and difficulty ambulating. repeat CT head showed hemorrhage left occipital lobe suggests hemorrhagic transformation. Patient was transferred to [facility] to 3100 to be placed on the stroke service. Patients Xarelto was held.   Patient was not a TPA candidate secondary to out of window. She was admitted to the neuro ICU for further evaluation and treatment.  SUBJECTIVE There are no family members are present today. The patient's speech is improving although it is still somewhat halting. Dr.Jozy Mcphearson once again explained the details regarding the anticoagulation. The patient expressed understanding.   OBJECTIVE Most recent Vital Signs: Filed Vitals:   12/07/12 1800 12/07/12 2113 12/08/12 0500  12/08/12 0600  BP: 114/72 107/57  111/55  Pulse: 82 65  63  Temp: 97.6 F (36.4 C) 97.9 F (36.6 C)  97.9 F (36.6 C)  TempSrc: Oral Oral  Oral  Resp: 16 18  18   Height:      Weight:   67.728 kg (149 lb 5 oz)   SpO2: 99% 96%  94%   CBG (last 3)   Recent Labs  12/07/12 0702 12/07/12 1113 12/07/12 1629  GLUCAP 102* 97 94    IV Fluid Intake:   . sodium chloride 950 mL (12/07/12 1831)  . heparin 1,250 Units/hr (12/07/12 1537)    MEDICATIONS  . hyoscyamine  0.125 mg Sublingual Daily  . multivitamin with minerals  1 tablet Oral Daily  . pantoprazole  40 mg Oral Daily  . spironolactone  25 mg Oral Daily  . warfarin   Does not apply Once  . Warfarin - Pharmacist Dosing Inpatient   Does not apply q1800   PRN:  acetaminophen, acetaminophen, labetalol, LORazepam  Diet:  Carb Control thin liquids Activity:  Bedrest DVT Prophylaxis:  IV heparin infusion  CLINICALLY SIGNIFICANT STUDIES Basic Metabolic Panel:   Recent Labs Lab 12/05/12 1155 12/06/12 1100 12/07/12 1018  NA 137 137 138  K 4.1 3.4* 4.0  CL 101 103 105  CO2 26 24 22   GLUCOSE 98 109* 99  BUN 12 12 11   CREATININE 0.61 0.54 0.60  CALCIUM 9.7 9.2 9.6  MG  --  1.9  --    Liver Function Tests:   Recent Labs Lab 12/04/12 1220 12/05/12 1155  AST 23 29  ALT 18 18  ALKPHOS 108 111  BILITOT 0.5 0.6  PROT 6.7 6.4  ALBUMIN 3.5 3.4*  CBC:  Recent Labs Lab 12/02/12 0843 12/04/12 1220  12/07/12 0600 12/08/12 0605  WBC 6.1 5.4  < > 4.5 3.5*  NEUTROABS 4.0 4.6  --   --   --   HGB 11.5* 11.2*  < > 10.6* 10.3*  HCT 35.9 34.0*  < > 31.8* 31.0*  MCV 92.3 90.2  < > 89.6 89.9  PLT 138* 120*  < > 109* 88*  < > = values in this interval not displayed. Coagulation:   Recent Labs Lab 12/04/12 1220 12/06/12 1451 12/07/12 0600 12/08/12 0605  LABPROT 25.2* 13.5 13.7 13.4  INR 2.38* 1.05 1.07 1.04   Cardiac Enzymes:   Recent Labs Lab 12/04/12 2240 12/05/12 0455 12/06/12 0430  TROPONINI 1.00*  1.11* 1.30*   Urinalysis:   Recent Labs Lab 12/04/12 1318  COLORURINE YELLOW  LABSPEC 1.022  PHURINE 6.5  GLUCOSEU NEGATIVE  HGBUR MODERATE*  BILIRUBINUR NEGATIVE  KETONESUR NEGATIVE  PROTEINUR NEGATIVE  UROBILINOGEN 0.2  NITRITE NEGATIVE  LEUKOCYTESUR NEGATIVE   Lipid Panel    Component Value Date/Time   CHOL 148 12/05/2012 0455   TRIG 89 12/05/2012 0455   HDL 43 12/05/2012 0455   CHOLHDL 3.4 12/05/2012 0455   VLDL 18 12/05/2012 0455   LDLCALC 87 12/05/2012 0455   HgbA1C  Lab Results  Component Value Date   HGBA1C 5.4 12/04/2012    Urine Drug Screen:   No results found for this basename: labopia,  cocainscrnur,  labbenz,  amphetmu,  thcu,  labbarb    Alcohol Level: No results found for this basename: ETH,  in the last 168 hours  Ct Head Wo Contrast 12/06/2012 :  1. Slightly decreased hemorrhage within the medial left occipital lobe. 2.  No new focus of intracranial hemorrhage.   12/05/2012   Hemorrhage left occipital lobe suggests hemorrhagic transformation of infarct in this region.  Scattered infarcts supratentorially (greater on the left) and within the cerebellum noted but better visualized on recent MR. It is difficult by CT examination to determine if there has been extension of these infarcts.  12/04/2012    Ventricular enlargement without corresponding sulcal prominence. 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  No mass, hemorrhage, or acute appearing infarct.  No gray-white compartment lesions are identified.     Mr Angiogram Head Wo Contrast 12/04/2012    : Anterior circulation without medium or large size vessel significant stenosis or occlusion.  Fetal type contribution to the posterior cerebral arteries.  Ectatic vertebral arteries and basilar artery without high-grade stenosis.  Non-visualized right PICA and left AICA with narrowing and irregularity of the left PICA and right AICA.  Slightly bulbous appearance of the basilar tip and superior  medial margin right posterior cerebral artery P1 segment without definitive saccular aneurysm.  Duplicated right superior cerebellar artery.  Anterior and posterior branch vessel irregularity.    Mr Brain Wo Contrast 12/05/2012    Numerous small acute infarcts bilaterally with progression since yesterday.  These findings suggest recurrent emboli.  Hemorrhage in the left occipital infarct is best seen on the CT earlier today.  Ventricular enlargement stable.  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  12/04/2012    Scattered acute non hemorrhagic infarcts involving portions of the left sub insular region, left frontal lobe, left parietal lobe, occipital lobe bilaterally and cerebellum bilaterally.  This distribution raises possibility embolic disease.  No intracranial hemorrhage.  Scattered nonspecific white matter type changes may reflect result of small vessel disease.  Atrophy.  Ventricular prominence may be related to atrophy  although not able to completely exclude a mild component hydrocephalus.     2D Echocardiogram  EF 15%, left atrium moderately dilated  Carotid Doppler  The vertebral arteries appear patent with antegrade flow.- Findings consistent with less than 39 percent stenosis involving the right internal carotid artery and the left internal carotid artery.  CXR  12/06/2012 - 1. Borderline cardiomegaly. 2. Linear subsegmental atelectasis in the left lower lobe.   EKG  normal sinus rhythm.   Therapy Recommendations - no further therapy recommended  Physical Exam   Pleasant elderly Caucasian lady not in distress.Awake alert. Afebrile. Head is nontraumatic. Neck is supple without bruit. Hearing is normal. Cardiac exam no murmur or gallop. Lungs are clear to auscultation. Distal pulses are well felt. Neurological Exam : Awake alert and oriented . Some word finding difficulties and hesitancy. Was able to name 3 successive objects without hesitation.  Extraocular movements are full range without  nystagmus. Face is mild right lower facial weakness. Tongue is midline. Moves all her extremities well against gravity with mild weakness of right grip and intensity muscles. Gait was not tested. Touch temperature sensation and coordination symmetric.   ASSESSMENT Monique [name] is a 67 y.o. female presenting with severe dysarthria. Imaging confirms scattered acute non hemorrhagic infarcts involving portions of the left sub insular region, left frontal lobe, left parietal lobe, occipital lobe bilaterally and cerebellum bilaterally. Infarcts felt to be embolic secondary to hypercoaguability or cardioembolic from low ejection fraction..  On aspirin 81 mg orally every day and xarelto prior to admission. Now on heparin and coumadin for secondary stroke prevention. Patient with resultant exxpressive aphasia. Work up completed.  Dr. Pearlean Brownie spoke to oncologist on call (Dr. Elfredia Nevins) Friday regarding anticoagulation in this patient. For primary hypercoagulability, full dose lovenox is indicated; however caution for increased risk of hemorrhagic risk in acute stroke setting per literature and iv heparin/warfarin may be safer.. Patient will need to stay on heparin and start coumadin load. Once INR reaches goal (2.0) or higher, stop heparin. Unless other issues, the patient can be discharged only when meeting this goal. Guy Franco PA-C had a 20 minute discussion with the patient and husband re: coumadin, risks of bleeding, and goals of care on Friday. They were agreeable to start.   DVT history on xarelto and asa  therapy prior to admission.  Cardiomyopathy, EF 15%, unknown cause (cardiotoxicity w/chemo)  Ovarian cancer (Lap for debulking) with hypercoaguability  Long term medication use  Acute on chronic systolic heart failure, Dr. Mendel Ryder  Hypokalemia - resolved  Nonsustained ventricular tachycardia  Hospital day # 4  TREATMENT/PLAN  Continue heparin for secondary stroke prevention until Coumadin is  therapeutic.  No further therapies recommended.  Pharmacy Coumadin dosing started Friday - 5 mg Friday and 5 mg yesterday - INR 1.04 today.  Plan discharge when INR is therapeutic.   I decreased normal saline IV fluids to KVO secondary to low ejection fraction. BNP was elevated on 12/05/2012 at 1,659. Consider repeat chest x-ray and BNP if patient becomes symptomatic. Patient is on spironolactone and low-dose lisinopril per cardiology.   Delton See PA-C [facility] Pager [phone] 12/08/2012, 8:28 AM  Positive fluid balance. Goal evolemia in setting of low EF. IFV are KVO Dosing Coumadin as per pharmacy, coumadin 7.5 today.  Pauletta Browns   I have personally obtained a history, examined the patient, evaluated imaging results, and formulated the assessment and plan of care. I agree with the above.

## 2012-12-09 ENCOUNTER — Inpatient Hospital Stay (HOSPITAL_COMMUNITY): Payer: Medicare Other

## 2012-12-09 LAB — CBC
HCT: 30.9 % — ABNORMAL LOW (ref 36.0–46.0)
MCHC: 34.3 g/dL (ref 30.0–36.0)
MCV: 88.5 fL (ref 78.0–100.0)
Platelets: 75 10*3/uL — ABNORMAL LOW (ref 150–400)
RDW: 15.4 % (ref 11.5–15.5)
WBC: 3 10*3/uL — ABNORMAL LOW (ref 4.0–10.5)

## 2012-12-09 MED ORDER — METOPROLOL SUCCINATE 12.5 MG HALF TABLET
12.5000 mg | ORAL_TABLET | Freq: Every day | ORAL | Status: DC
  Administered 2012-12-09: 13:00:00 via ORAL
  Administered 2012-12-10 – 2012-12-11 (×2): 12.5 mg via ORAL
  Filled 2012-12-09 (×3): qty 1

## 2012-12-09 MED ORDER — GADOBENATE DIMEGLUMINE 529 MG/ML IV SOLN
20.0000 mL | Freq: Once | INTRAVENOUS | Status: AC
  Administered 2012-12-09: 20 mL via INTRAVENOUS

## 2012-12-09 MED ORDER — WARFARIN SODIUM 10 MG PO TABS
10.0000 mg | ORAL_TABLET | Freq: Once | ORAL | Status: AC
  Administered 2012-12-09: 10 mg via ORAL
  Filled 2012-12-09: qty 1

## 2012-12-09 NOTE — Progress Notes (Signed)
Patient Name: Monique [name] Date of Encounter: 12/09/2012    SUBJECTIVE:Engages in conversation. She has dysarthria. She says her breathing is improved.  TELEMETRY:  Normal sinus rhythm: Filed Vitals:   12/08/12 2147 12/09/12 0150 12/09/12 0533 12/09/12 0935  BP: 111/63 103/43 99/42 102/47  Pulse: 77 60 61 64  Temp: 98 F (36.7 C) 98.4 F (36.9 C) 98.2 F (36.8 C) 97.9 F (36.6 C)  TempSrc: Oral Oral Oral Oral  Resp: 16 16 16 20   Height:      Weight:   71.85 kg (158 lb 6.4 oz)   SpO2: 100% 97% 99% 100%   No intake or output data in the 24 hours ending 12/09/12 1004  LABS: Basic Metabolic Panel:  Recent Labs  78/29/56 1100 12/07/12 1018  NA 137 138  K 3.4* 4.0  CL 103 105  CO2 24 22  GLUCOSE 109* 99  BUN 12 11  CREATININE 0.54 0.60  CALCIUM 9.2 9.6  MG 1.9  --    CBC:  Recent Labs  12/08/12 0605 12/09/12 0645  WBC 3.5* 3.0*  HGB 10.3* 10.6*  HCT 31.0* 30.9*  MCV 89.9 88.5  PLT 88* 75*   Results for DEVANIE, GALANTI (MRN [id number]) as of 12/09/2012 10:02  Ref. Range 12/04/2012 16:44 12/04/2012 22:40 12/05/2012 04:55 12/05/2012 14:17 12/06/2012 04:30  Troponin I Latest Range: <0.30 ng/mL 0.95 (HH) 1.00 (HH) 1.11 (HH)  1.30 (HH)   Radiology/Studies:  No recent dta  Physical Exam: Blood pressure 102/47, pulse 64, temperature 97.9 F (36.6 C), temperature source Oral, resp. rate 20, height 5\' 7"  (1.702 m), weight 71.85 kg (158 lb 6.4 oz), SpO2 100.00%. Weight change: 4.122 kg (9 lb 1.4 oz)   S4 gallop is new.  No JVD.  No edema.  Neurologically, dysarthria is obvious  ASSESSMENT:  1. Cardiomyopathy, suspect a cardiotoxicity from chemotherapy.  2. Falling platelet count, chemotherapy versus heparin-induced thrombocytopenia  3. Recent embolic stroke with hemorrhagic transformation  Plan:  1. Cardiac MRI with and without contrast  2. Continue heart failure therapy including ACE and beta blocker.  3. Continue low-dose diuretic therapy to prevent overt  heart failure  4. Heparin-induced thrombocytopenia panel  Signed, Lesleigh Noe 12/09/2012, 10:04 AM

## 2012-12-09 NOTE — Progress Notes (Signed)
Stroke Team Progress Note  HISTORY Monique [name] is an 67 y.o. female with a past medical history significant for ovarian cancer currently undergoing chemotherapy with gemcitabine, newly diagnosed systolic heart failure with EF 15%, chronic anticoagulation on xarelto due to bilateral DVT s/p IVC filter placement, admitted to [facility] with new onset dysarthria. She stated that she went to bed on the evening of 12/03/2012 feeling well, but approximately 1 hour after waking up on 12/04/2012 noticed that her speech was slurred and she couldn't talk properly. Never had similar symptoms before.  She denied associated headache, vertigo, double vision, difficulty swallowing, confusion, or unsteadiness.  She presented to [facility] ED for further evaluation and management but was out of the window for intravenous thrombolysis.  CT brain upon arrival to ED showed no acute abnormality, but a subsequent MRI-DWI disclosed scattered. small acute non hemorrhagic infarcts involving portions of the left sub insular region, left frontal lobe, left parietal lobe, occipital lobe bilaterally and cerebellum bilaterally.  She was on xarelto prior to admission. Patient was noted early in the morning of 12/05/12 to have increased aphasia, right arm weakness and difficulty ambulating. repeat CT head showed hemorrhage left occipital lobe suggests hemorrhagic transformation. Patient was transferred to [facility] to 3100 to be placed on the stroke service. Patients Xarelto was held.   Patient was not a TPA candidate secondary to out of window. She was admitted to the neuro ICU for further evaluation and treatment.  SUBJECTIVE Has walked in hall today. Dysarthria better.   OBJECTIVE Most recent Vital Signs: Filed Vitals:   12/08/12 2147 12/09/12 0150 12/09/12 0533 12/09/12 0935  BP: 111/63 103/43 99/42 102/47  Pulse: 77 60 61 64  Temp: 98 F (36.7 C) 98.4 F (36.9 C) 98.2 F (36.8 C) 97.9 F (36.6 C)  TempSrc: Oral Oral Oral Oral  Resp: 16 16  16 20   Height:      Weight:   71.85 kg (158 lb 6.4 oz)   SpO2: 100% 97% 99% 100%   CBG (last 3)   Recent Labs  12/07/12 0702 12/07/12 1113 12/07/12 1629  GLUCAP 102* 97 94    IV Fluid Intake:   . sodium chloride 10 mL/hr at 12/08/12 0952  . heparin 1,150 Units/hr (12/08/12 1220)    MEDICATIONS  . lisinopril  2.5 mg Oral Daily  . multivitamin with minerals  1 tablet Oral Daily  . pantoprazole  40 mg Oral Daily  . spironolactone  25 mg Oral Daily  . warfarin  10 mg Oral ONCE-1800  . warfarin   Does not apply Once  . Warfarin - Pharmacist Dosing Inpatient   Does not apply q1800   PRN:  acetaminophen, acetaminophen, labetalol, LORazepam  Diet:  Cardiac thin liquids Activity:  Bedrest DVT Prophylaxis:  IV heparin infusion  CLINICALLY SIGNIFICANT STUDIES Basic Metabolic Panel:   Recent Labs Lab 12/05/12 1155 12/06/12 1100 12/07/12 1018  NA 137 137 138  K 4.1 3.4* 4.0  CL 101 103 105  CO2 26 24 22   GLUCOSE 98 109* 99  BUN 12 12 11   CREATININE 0.61 0.54 0.60  CALCIUM 9.7 9.2 9.6  MG  --  1.9  --    Liver Function Tests:   Recent Labs Lab 12/04/12 1220 12/05/12 1155  AST 23 29  ALT 18 18  ALKPHOS 108 111  BILITOT 0.5 0.6  PROT 6.7 6.4  ALBUMIN 3.5 3.4*   CBC:  Recent Labs Lab 12/04/12 1220  12/08/12 0605 12/09/12 0645  WBC  5.4  < > 3.5* 3.0*  NEUTROABS 4.6  --   --   --   HGB 11.2*  < > 10.3* 10.6*  HCT 34.0*  < > 31.0* 30.9*  MCV 90.2  < > 89.9 88.5  PLT 120*  < > 88* 75*  < > = values in this interval not displayed. Coagulation:   Recent Labs Lab 12/06/12 1451 12/07/12 0600 12/08/12 0605 12/09/12 0645  LABPROT 13.5 13.7 13.4 13.8  INR 1.05 1.07 1.04 1.08   Cardiac Enzymes:   Recent Labs Lab 12/04/12 2240 12/05/12 0455 12/06/12 0430  TROPONINI 1.00* 1.11* 1.30*   Urinalysis:   Recent Labs Lab 12/04/12 1318  COLORURINE YELLOW  LABSPEC 1.022  PHURINE 6.5  GLUCOSEU NEGATIVE  HGBUR MODERATE*  BILIRUBINUR NEGATIVE   KETONESUR NEGATIVE  PROTEINUR NEGATIVE  UROBILINOGEN 0.2  NITRITE NEGATIVE  LEUKOCYTESUR NEGATIVE   Lipid Panel    Component Value Date/Time   CHOL 148 12/05/2012 0455   TRIG 89 12/05/2012 0455   HDL 43 12/05/2012 0455   CHOLHDL 3.4 12/05/2012 0455   VLDL 18 12/05/2012 0455   LDLCALC 87 12/05/2012 0455   HgbA1C  Lab Results  Component Value Date   HGBA1C 5.4 12/04/2012    Urine Drug Screen:   No results found for this basename: labopia,  cocainscrnur,  labbenz,  amphetmu,  thcu,  labbarb    Alcohol Level: No results found for this basename: ETH,  in the last 168 hours  Ct Head Wo Contrast 12/06/2012 :  1. Slightly decreased hemorrhage within the medial left occipital lobe. 2.  No new focus of intracranial hemorrhage.   12/05/2012   Hemorrhage left occipital lobe suggests hemorrhagic transformation of infarct in this region.  Scattered infarcts supratentorially (greater on the left) and within the cerebellum noted but better visualized on recent MR. It is difficult by CT examination to determine if there has been extension of these infarcts.  12/04/2012    Ventricular enlargement without corresponding sulcal prominence. 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  No mass, hemorrhage, or acute appearing infarct.  No gray-white compartment lesions are identified.     Mr Angiogram Head Wo Contrast 12/04/2012    : Anterior circulation without medium or large size vessel significant stenosis or occlusion.  Fetal type contribution to the posterior cerebral arteries.  Ectatic vertebral arteries and basilar artery without high-grade stenosis.  Non-visualized right PICA and left AICA with narrowing and irregularity of the left PICA and right AICA.  Slightly bulbous appearance of the basilar tip and superior medial margin right posterior cerebral artery P1 segment without definitive saccular aneurysm.  Duplicated right superior cerebellar artery.  Anterior and posterior branch  vessel irregularity.    Mr Brain Wo Contrast 12/05/2012    Numerous small acute infarcts bilaterally with progression since yesterday.  These findings suggest recurrent emboli.  Hemorrhage in the left occipital infarct is best seen on the CT earlier today.  Ventricular enlargement stable.  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  12/04/2012    Scattered acute non hemorrhagic infarcts involving portions of the left sub insular region, left frontal lobe, left parietal lobe, occipital lobe bilaterally and cerebellum bilaterally.  This distribution raises possibility embolic disease.  No intracranial hemorrhage.  Scattered nonspecific white matter type changes may reflect result of small vessel disease.  Atrophy.  Ventricular prominence may be related to atrophy although not able to completely exclude a mild component hydrocephalus.     2D Echocardiogram  EF 15%, left atrium moderately  dilated  Carotid Doppler  The vertebral arteries appear patent with antegrade flow.- Findings consistent with less than 39 percent stenosis involving the right internal carotid artery and the left internal carotid artery.  CXR  12/06/2012 - 1. Borderline cardiomegaly. 2. Linear subsegmental atelectasis in the left lower lobe.   EKG  normal sinus rhythm.   Therapy Recommendations - no further therapy recommended  Physical Exam   Pleasant elderly Caucasian lady not in distress.Awake alert. Afebrile. Head is nontraumatic. Neck is supple without bruit. Hearing is normal. Cardiac exam no murmur or gallop. Lungs are clear to auscultation. Distal pulses are well felt. Neurological Exam : Awake alert and oriented . Some word finding difficulties and hesitancy. Was able to name 3 successive objects without hesitation.Able to write quite well fluently but speech shows mild dysarthria and expressive difficulties.  Extraocular movements are full range without nystagmus. Face is mild right lower facial weakness. Tongue is midline. Moves  all her extremities well against gravity with mild weakness of right grip and intensity muscles. Gait was not tested. Touch temperature sensation and coordination symmetric.   ASSESSMENT [name] is a 67 y.o. female presenting with severe dysarthria. Imaging confirms scattered acute non hemorrhagic infarcts involving portions of the left sub insular region, left frontal lobe, left parietal lobe, occipital lobe bilaterally and cerebellum bilaterally. Infarcts felt to be embolic secondary to hypercoaguability or cardioembolic from low ejection fraction..  On aspirin 81 mg orally every day and xarelto prior to admission. Now on heparin and coumadin for secondary stroke prevention. Patient with resultant dysarthria. Work up completed.  Dr. Pearlean Brownie spoke to oncologist on call (Dr. Elfredia Nevins) Friday regarding anticoagulation in this patient. For primary hypercoagulability, full dose lovenox is indicated; however caution for increased risk of hemorrhagic risk in acute stroke setting per literature and iv heparin/warfarin may be safer.. Patient will need to stay on heparin and start coumadin load. Once INR reaches goal (2.0) or higher, stop heparin. Unless other issues, the patient can be discharged only when meeting this goal. Guy Franco PA-C had a 20 minute discussion with the patient and husband re: coumadin, risks of bleeding, and goals of care on 12/06/2012. They were agreeable to start.   DVT history on xarelto and asa  therapy prior to admission. Considered a Xarelto failure.  Cardiomyopathy, EF 15%, unknown cause (cardiotoxicity w/chemo)  Ovarian cancer (Lap for debulking) with hypercoaguability  Long term medication use  Acute on chronic systolic heart failure, Dr. Mendel Ryder  Hypokalemia - resolved  Nonsustained ventricular tachycardia  Thrombocytopenia 138>>>103>>>75  Hospital day # 5  TREATMENT/PLAN  Continue heparin for secondary stroke prevention until Coumadin is therapeutic of  2.0.  No further therapies recommended.  HIT panel ordered  Serial CBCs  Cardiology: cardiac MRI   Gwendolyn Lima. Manson Passey, [facility], MBA, MHA [facility] Pager: [phone] 12/09/2012 11:51 AM  I have personally obtained a history, examined the patient, evaluated imaging results, and formulated the assessment and plan of care. I agree with the above. Delia Heady, MD

## 2012-12-09 NOTE — Progress Notes (Signed)
ANTICOAGULATION CONSULT NOTE - Follow Up Consult  Pharmacy Consult for Heparin and Coumadin Indication: hx DVT/embolic stroke  Patient Measurements: Height: 5\' 7"  (170.2 cm) Weight: 158 lb 6.4 oz (71.85 kg) IBW/kg (Calculated) : 61.6 Heparin Dosing Weight: 69kg  Vital Signs: Temp: 97.9 F (36.6 C) (08/18 0935) Temp src: Oral (08/18 0935) BP: 102/47 mmHg (08/18 0935) Pulse Rate: 64 (08/18 0935)  Labs:  Recent Labs  12/06/12 1100  12/06/12 1451  12/07/12 0600 12/07/12 1018 12/07/12 1240 12/08/12 0605 12/09/12 0645  HGB  --   --   --   < > 10.6*  --   --  10.3* 10.6*  HCT  --   --   --   --  31.8*  --   --  31.0* 30.9*  PLT  --   --   --   --  109*  --   --  88* 75*  APTT  --   --  55*  --   --   --   --   --   --   LABPROT  --   < > 13.5  --  13.7  --   --  13.4 13.8  INR  --   < > 1.05  --  1.07  --   --  1.04 1.08  HEPARINUNFRC  --   < > 0.27*  --  0.37  --  0.34 0.57 0.31  CREATININE 0.54  --   --   --   --  0.60  --   --   --   < > = values in this interval not displayed.  Estimated Creatinine Clearance: 68.2 ml/min (by C-G formula based on Cr of 0.6).   Medications:  Heparin 1150 units/hr  Assessment: 66YOF with hx DVT on Xarelto PTA with last dose 8/13 AM. Due to embolic stroke pts primary hypercoagulability, she has been transitioned to heparin bridging to Coumadin. Heparin level remains therapeutic at 0.31 this morning after a slight redcution in rate yesterday. INR (1.08) relatively unchanged- unsure why patient is resistant to warfarin. Hgb stable, Plts trending down 75. No significant bleeding reported.  8/15 CT head - slight decreased hemorrhage w/in medial left occipital lobe/ no new ICH  Goal of Therapy:  Heparin level 0.3-0.5 units/ml INR ~2 per Neuro Monitor platelets by anticoagulation protocol: Yes   Plan:  1. Continue heparin drip at 1150 units/hr (11.5 ml/hr) 2. Coumadin 10mg  po x 1 today 3. Follow-up AM INR and HL 4. Watch CBC closely and  monitor for any signs of bleeding  Calistro Rauf D. Kanaan Kagawa, PharmD Clinical Pharmacist Pager: [phone] 12/09/2012 9:43 AM

## 2012-12-10 DIAGNOSIS — I428 Other cardiomyopathies: Secondary | ICD-10-CM

## 2012-12-10 LAB — PROTIME-INR
INR: 1.47 (ref 0.00–1.49)
Prothrombin Time: 17.4 seconds — ABNORMAL HIGH (ref 11.6–15.2)

## 2012-12-10 LAB — CBC
Hemoglobin: 10.7 g/dL — ABNORMAL LOW (ref 12.0–15.0)
MCH: 30.6 pg (ref 26.0–34.0)
MCHC: 34.1 g/dL (ref 30.0–36.0)
RDW: 15.5 % (ref 11.5–15.5)

## 2012-12-10 LAB — HEPARIN LEVEL (UNFRACTIONATED): Heparin Unfractionated: 0.35 IU/mL (ref 0.30–0.70)

## 2012-12-10 MED ORDER — WARFARIN SODIUM 10 MG PO TABS
10.0000 mg | ORAL_TABLET | Freq: Once | ORAL | Status: AC
  Administered 2012-12-10: 10 mg via ORAL
  Filled 2012-12-10: qty 1

## 2012-12-10 NOTE — Progress Notes (Signed)
ANTICOAGULATION CONSULT NOTE - Follow Up Consult  Pharmacy Consult for Heparin and Coumadin Indication: hx DVT/embolic stroke  Patient Measurements: Height: 5\' 7"  (170.2 cm) Weight: 158 lb 6.4 oz (71.85 kg) IBW/kg (Calculated) : 61.6 Heparin Dosing Weight: 69kg  Vital Signs: Temp: 98.2 F (36.8 C) (08/19 0534) Temp src: Oral (08/19 0534) BP: 108/44 mmHg (08/19 0534) Pulse Rate: 58 (08/19 0534)  Labs:  Recent Labs  12/07/12 1018  12/08/12 0605 12/09/12 0645 12/10/12 0400  HGB  --   < > 10.3* 10.6* 10.7*  HCT  --   --  31.0* 30.9* 31.4*  PLT  --   --  88* 75* 68*  LABPROT  --   --  13.4 13.8 17.4*  INR  --   --  1.04 1.08 1.47  HEPARINUNFRC  --   < > 0.57 0.31 0.35  CREATININE 0.60  --   --   --   --   < > = values in this interval not displayed.  Estimated Creatinine Clearance: 68.2 ml/min (by C-G formula based on Cr of 0.6).   Medications:  Heparin 1150 units/hr  Assessment: 66YOF with hx DVT on Xarelto PTA with last dose 8/13 AM. Due to embolic stroke pts primary hypercoagulability, she has been transitioned to heparin bridging to Coumadin. Heparin level remains therapeutic at 0.35 this morning. INR increased to 1.47- likely reflective of 7.5mg  dose she received 8/17- expect more increase tomorrow as she received 10mg  last evening. Hgb stable, Plts trended down to 68- HIT panel sent yesterday per cards. Note the highest her platelets have been during this admission was 138 on 8/11. No significant bleeding reported.  Goal of Therapy:  Heparin level 0.3-0.5 units/ml INR ~2 per Neuro Monitor platelets by anticoagulation protocol: Yes   Plan:  1. Continue heparin drip at 1150 units/hr (11.5 ml/hr) 2. Coumadin 10mg  po x 1 today 3. Follow-up AM INR and HL 4. Watch CBC closely and monitor for any signs of bleeding 5. Follow up HIT panel  Tangee Marszalek D. Ellsie Violette, PharmD Clinical Pharmacist Pager: [phone] 12/10/2012 8:40 AM

## 2012-12-10 NOTE — Progress Notes (Addendum)
Speech Language Pathology Treatment Patient Details Name: Monique [name] MRN: [id number] DOB: 15-Oct-1945 Today's Date: 12/10/2012 Time: 6295-2841 SLP Time Calculation (min): 26 min  Assessment / Plan / Recommendation Clinical Impression  Pt reports that dysarthria has improved, especially when she woke up this morning. Demonstrated use of articulation strategies well, with minimal cues required for use. Most challenging words continue to be multisyllabic, words that contain consonant clusters, and more difficult alveolar and palatal sounds such as SH, CH, and L. Reviewed strategies of slow, loud, overarticulated speech, along with "stretching out" consonant clusters, such as making the word "plate" more like "puh-late". Also discussed ways to practice while reading out loud and when conversing. Speech in conversation is about 85% intelligible with use of strategies; pt aware of difficulties and expressed frustration. Difficulties seem to be affecting motor speech only; cognitive/language issues not demonstrated. Speech difficulties seem to currently have a negative impact on pt's quality of life. Rx that speech continue to follow and for pt to follow up with O/P speech therapy when discharged from hospital; discussed this with pt who agrees with plan.    SLP Plan  Goals updated    Pertinent Vitals/Pain n/a  SLP Goals  SLP Goals Potential to Achieve Goals: Good Progress/Goals/Alternative treatment plan discussed with pt/caregiver and they: Agree SLP Goal #1: Pt will verbalize at the conversation level with at least 90% intelligibility with min cues for slow, loud, overarticulated speech. SLP Goal #1 - Progress: Progressing toward goal SLP Goal #2: Pt will correctly produce 2-3 syllable words x10 with adequate articulation and min auditory/ visual cues. SLP Goal #2 - Progress: Progressing toward goal  General Temperature Spikes Noted: No Respiratory Status: Room air Behavior/Cognition:  Alert;Cooperative;Pleasant mood Oral Cavity - Dentition: Adequate natural dentition Patient Positioning: Upright in bed  Oral Cavity - Oral Hygiene     Treatment     GO     Metro Kung, MA, CFY-SLP 12/10/2012, 11:25 AM

## 2012-12-10 NOTE — Care Management Note (Unsigned)
    Page 1 of 1   12/11/2012     10:42:22 AM   CARE MANAGEMENT NOTE 12/11/2012  Patient:  CAPITOLA, LADSON   Account Number:  [id number]  Date Initiated:  12/09/2012  Documentation initiated by:  Jiles Crocker  Subjective/Objective Assessment:   ADMITTED WITH STROKE     Action/Plan:   CM FOLLOWING FOR DCP   Anticipated DC Date:  12/13/2012   Anticipated DC Plan:  HOME/SELF CARE      DC Planning Services  CM consult      Choice offered to / List presented to:             Status of service:  Completed, signed off Medicare Important Message given?  NA - LOS <3 / Initial given by admissions (If response is "NO", the following Medicare IM given date fields will be blank) Date Medicare IM given:   Date Additional Medicare IM given:    Discharge Disposition:    Per UR Regulation:  Reviewed for med. necessity/level of care/duration of stay  If discussed at Long Length of Stay Meetings, dates discussed:    Comments:  12/11/12 1015 Elmer Bales RN, MSN, CM- Spoke with Rebecka Apley [phone] at [facility] regarding referral for speech therapy. Referral has been accepted and requested information was faxed to [phone]. Appointment was made for Tuesday, August 26th at 1300. Patient is to arrive at 1230 for registration.  Patient was given written information regarding the appointment. Information was added to the AVS for discharge.  12/10/12 Elmer Bales RN, MSN, CM- Met with patient to discuss outpatient speech therapy.  Pt is requesting a facility in [facility]. CM will contact [facility] in the morning.  Pt is aware of plan.

## 2012-12-10 NOTE — Progress Notes (Signed)
Patient Name: Monique [name] Date of Encounter: 12/10/2012    SUBJECTIVE: She is doing okay based upon chart review. Has done the cardiac MRIt.  TELEMETRY:  Regular rhythm: Filed Vitals:   12/09/12 2051 12/10/12 0136 12/10/12 0534 12/10/12 1115  BP: 103/51 94/43 108/44 116/61  Pulse: 60 103 58 63  Temp: 97.8 F (36.6 C) 98.1 F (36.7 C) 98.2 F (36.8 C) 97.8 F (36.6 C)  TempSrc: Oral Oral Oral Oral  Resp: 18 18 18 20   Height:      Weight:      SpO2: 100% 99% 100% 99%    Intake/Output Summary (Last 24 hours) at 12/10/12 1324 Last data filed at 12/10/12 0906  Gross per 24 hour  Intake    240 ml  Output      0 ml  Net    240 ml    LABS: Basic Metabolic Panel: No results found for this basename: NA, K, CL, CO2, GLUCOSE, BUN, CREATININE, CALCIUM, MG, PHOS,  in the last 72 hours CBC:  Recent Labs  12/09/12 0645 12/10/12 0400  WBC 3.0* 4.0  HGB 10.6* 10.7*  HCT 30.9* 31.4*  MCV 88.5 89.7  PLT 75* 68*     Radiology/Studies:  Cardiac MRI:  Indication: Cardiomyopathy  Protocol: The patient was scanned on a 1.5 Tesla GE magnet. A  dedicated cardiac coil was used. Functional imaging was done using  Fiesta sequences. 2,3 and 4 chamber views were done to assess  RWMA;s. Quantitative EF was calculated using Circle software on a  dedicated work station. The patient received 20cc of Multihance.  After 10 minutes inversion recovery sequences were done to assess  for infarct or scar  Findings: There was mild LAE. There was moderate LVE. RA and RV  were normal. There was no ASD or VSD. There was a trivial  pericardial effusion. The AV, MV and TV were structurally normal.  The mitral annulus was dilated with MR.  There were no discrete RWMAls or thinning of the myocardium. The  quantitative EF was 27% ( EDV 230 ESV 168 SV 62 )  Delayed enhancement images showed no scar, infarction or  infiltration  Impression  1) Moderate LVE with diffuse hypokinesis EF 27%  2) No  hyperenhancement , scar or infiltration of the LV  myocardium  3) Mild LAE  4) Mitral annular dilatation with MR  5) Trivial pericardial effusion  Charlton Haws MD [facility]  Original Report Authenticated By: Charlton Haws, M.D.    Physical Exam: Blood pressure 116/61, pulse 63, temperature 97.8 F (36.6 C), temperature source Oral, resp. rate 20, height 5\' 7"  (1.702 m), weight 71.85 kg (158 lb 6.4 oz), SpO2 99.00%. Weight change:    Not examined  ASSESSMENT:  1. Systolic heart failure now documented by MR. No obvious thrombi. Pattern is c/w cardiomypathy and not ischemic heart disease.   Plan:  1. Continue therapy and titrate as tolerated by BP  Signed, Lesleigh Noe 12/10/2012, 1:24 PM

## 2012-12-10 NOTE — Progress Notes (Signed)
Brief Nutrition Note:   RD consulted for " Coumadin Diet Education". This education is provided by Pharmacy. RD notified Pharmacy of education needs.   RD will not follow. Please consult as needed.   Clarene Duke RD, LDN Pager [phone] After Hours pager [phone]

## 2012-12-10 NOTE — Progress Notes (Signed)
Stroke Team Progress Note  HISTORY Monique [name] is an 67 y.o. female with a past medical history significant for ovarian cancer currently undergoing chemotherapy with gemcitabine, newly diagnosed systolic heart failure with EF 15%, chronic anticoagulation on xarelto due to bilateral DVT s/p IVC filter placement, admitted to [facility] with new onset dysarthria. She stated that she went to bed on the evening of 12/03/2012 feeling well, but approximately 1 hour after waking up on 12/04/2012 noticed that her speech was slurred and she couldn't talk properly. Never had similar symptoms before. She denied associated headache, vertigo, double vision, difficulty swallowing, confusion, or unsteadiness.   She presented to [facility] ED for further evaluation and management but was out of the window for intravenous thrombolysis.  CT brain upon arrival to ED showed no acute abnormality, but a subsequent MRI-DWI disclosed scattered. small acute non hemorrhagic infarcts involving portions of the left sub insular region, left frontal lobe, left parietal lobe, occipital lobe bilaterally and cerebellum bilaterally.   She was on xarelto prior to admission. Patient was noted early in the morning of 12/05/12 to have increased aphasia, right arm weakness and difficulty ambulating. repeat CT head showed hemorrhage left occipital lobe suggests hemorrhagic transformation. Patient was transferred to [facility] to 3100 to be placed on the stroke service. Patients Xarelto was held.   Patient was not a TPA candidate secondary to out of window. She was admitted to the neuro ICU for further evaluation and treatment.  SUBJECTIVE  Patient up in bed. Has been walking the halls. No new complaints or symptoms.  OBJECTIVE Most recent Vital Signs: Filed Vitals:   12/09/12 1354 12/09/12 2051 12/10/12 0136 12/10/12 0534  BP: 93/40 103/51 94/43 108/44  Pulse: 67 60 103 58  Temp: 98 F (36.7 C) 97.8 F (36.6 C) 98.1 F (36.7 C) 98.2 F (36.8 C)   TempSrc: Oral Oral Oral Oral  Resp: 20 18 18 18   Height:      Weight:      SpO2: 98% 100% 99% 100%   CBG (last 3)   Recent Labs  12/07/12 1113 12/07/12 1629  GLUCAP 97 94    IV Fluid Intake:   . sodium chloride 10 mL/hr at 12/08/12 0952  . heparin 1,150 Units/hr (12/10/12 0656)    MEDICATIONS  . lisinopril  2.5 mg Oral Daily  . metoprolol succinate  12.5 mg Oral Daily  . multivitamin with minerals  1 tablet Oral Daily  . pantoprazole  40 mg Oral Daily  . spironolactone  25 mg Oral Daily  . Warfarin - Pharmacist Dosing Inpatient   Does not apply q1800   PRN:  acetaminophen, acetaminophen, labetalol, LORazepam  Diet:  Cardiac thin liquids Activity:  Bedrest DVT Prophylaxis:  IV heparin infusion  CLINICALLY SIGNIFICANT STUDIES Basic Metabolic Panel:   Recent Labs Lab 12/05/12 1155 12/06/12 1100 12/07/12 1018  NA 137 137 138  K 4.1 3.4* 4.0  CL 101 103 105  CO2 26 24 22   GLUCOSE 98 109* 99  BUN 12 12 11   CREATININE 0.61 0.54 0.60  CALCIUM 9.7 9.2 9.6  MG  --  1.9  --    Liver Function Tests:   Recent Labs Lab 12/04/12 1220 12/05/12 1155  AST 23 29  ALT 18 18  ALKPHOS 108 111  BILITOT 0.5 0.6  PROT 6.7 6.4  ALBUMIN 3.5 3.4*   CBC:  Recent Labs Lab 12/04/12 1220  12/09/12 0645 12/10/12 0400  WBC 5.4  < > 3.0* 4.0  NEUTROABS  4.6  --   --   --   HGB 11.2*  < > 10.6* 10.7*  HCT 34.0*  < > 30.9* 31.4*  MCV 90.2  < > 88.5 89.7  PLT 120*  < > 75* 68*  < > = values in this interval not displayed. Coagulation:   Recent Labs Lab 12/07/12 0600 12/08/12 0605 12/09/12 0645 12/10/12 0400  LABPROT 13.7 13.4 13.8 17.4*  INR 1.07 1.04 1.08 1.47   Cardiac Enzymes:   Recent Labs Lab 12/04/12 2240 12/05/12 0455 12/06/12 0430  TROPONINI 1.00* 1.11* 1.30*   Urinalysis:   Recent Labs Lab 12/04/12 1318  COLORURINE YELLOW  LABSPEC 1.022  PHURINE 6.5  GLUCOSEU NEGATIVE  HGBUR MODERATE*  BILIRUBINUR NEGATIVE  KETONESUR NEGATIVE  PROTEINUR  NEGATIVE  UROBILINOGEN 0.2  NITRITE NEGATIVE  LEUKOCYTESUR NEGATIVE   Lipid Panel    Component Value Date/Time   CHOL 148 12/05/2012 0455   TRIG 89 12/05/2012 0455   HDL 43 12/05/2012 0455   CHOLHDL 3.4 12/05/2012 0455   VLDL 18 12/05/2012 0455   LDLCALC 87 12/05/2012 0455   HgbA1C  Lab Results  Component Value Date   HGBA1C 5.4 12/04/2012    Urine Drug Screen:   No results found for this basename: labopia,  cocainscrnur,  labbenz,  amphetmu,  thcu,  labbarb    Alcohol Level: No results found for this basename: ETH,  in the last 168 hours  Ct Head Wo Contrast 12/06/2012 :  1. Slightly decreased hemorrhage within the medial left occipital lobe. 2.  No new focus of intracranial hemorrhage.   12/05/2012   Hemorrhage left occipital lobe suggests hemorrhagic transformation of infarct in this region.  Scattered infarcts supratentorially (greater on the left) and within the cerebellum noted but better visualized on recent MR. It is difficult by CT examination to determine if there has been extension of these infarcts.  12/04/2012    Ventricular enlargement without corresponding sulcal prominence. 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  No mass, hemorrhage, or acute appearing infarct.  No gray-white compartment lesions are identified.     Mr Angiogram Head Wo Contrast 12/04/2012    : Anterior circulation without medium or large size vessel significant stenosis or occlusion.  Fetal type contribution to the posterior cerebral arteries.  Ectatic vertebral arteries and basilar artery without high-grade stenosis.  Non-visualized right PICA and left AICA with narrowing and irregularity of the left PICA and right AICA.  Slightly bulbous appearance of the basilar tip and superior medial margin right posterior cerebral artery P1 segment without definitive saccular aneurysm.  Duplicated right superior cerebellar artery.  Anterior and posterior branch vessel irregularity.    Mr Brain Wo  Contrast 12/05/2012    Numerous small acute infarcts bilaterally with progression since yesterday.  These findings suggest recurrent emboli.  Hemorrhage in the left occipital infarct is best seen on the CT earlier today.  Ventricular enlargement stable.  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  12/04/2012    Scattered acute non hemorrhagic infarcts involving portions of the left sub insular region, left frontal lobe, left parietal lobe, occipital lobe bilaterally and cerebellum bilaterally.  This distribution raises possibility embolic disease.  No intracranial hemorrhage.  Scattered nonspecific white matter type changes may reflect result of small vessel disease.  Atrophy.  Ventricular prominence may be related to atrophy although not able to completely exclude a mild component hydrocephalus.    MR CARD  2D Echocardiogram  EF 15%, left atrium moderately dilated  Carotid Doppler  The  vertebral arteries appear patent with antegrade flow.- Findings consistent with less than 39 percent stenosis involving the right internal carotid artery and the left internal carotid artery.  CXR  12/06/2012 - 1. Borderline cardiomegaly. 2. Linear subsegmental atelectasis in the left lower lobe.   EKG  normal sinus rhythm.   Therapy Recommendations - no further therapy recommended  Physical Exam   Pleasant elderly Caucasian lady not in distress.Awake alert. Afebrile. Head is nontraumatic. Neck is supple without bruit. Hearing is normal. Cardiac exam no murmur or gallop. Lungs are clear to auscultation. Distal pulses are well felt. Neurological Exam : Awake alert and oriented . Some word finding difficulties and hesitancy. Was able to name 3 successive objects without hesitation.Able to write quite well fluently but speech shows mild dysarthria and expressive difficulties.  Extraocular movements are full range without nystagmus. Face is mild right lower facial weakness. Tongue is midline. Moves all her extremities well  against gravity with mild weakness of right grip and intensity muscles. Gait was not tested. Touch temperature sensation and coordination symmetric.   ASSESSMENT Monique [name] is a 67 y.o. female presenting with severe dysarthria. Imaging confirms scattered acute non hemorrhagic infarcts involving portions of the left sub insular region, left frontal lobe, left parietal lobe, occipital lobe bilaterally and cerebellum bilaterally. Infarcts felt to be embolic secondary to hypercoaguability or cardioembolic from low ejection fraction..  On aspirin 81 mg orally every day and xarelto prior to admission. Now on heparin and coumadin for secondary stroke prevention. Patient with resultant dysarthria. Work up completed.  Dr. Pearlean Brownie spoke to oncologist on call (Dr. Vicker)12/06/12 regarding anticoagulation in this patient. For primary hypercoagulability, full dose lovenox is indicated; however caution for increased risk of hemorrhagic risk in acute stroke setting per literature and iv heparin/warfarin may be safer.. Patient will need to stay on heparin and start coumadin load. Once INR reaches goal (2.0) or higher, stop heparin. Unless other issues, the patient can be discharged only when meeting this goal. Guy Franco PA-C had a 20 minute discussion with the patient and husband re: coumadin, risks of bleeding, and goals of care on 12/06/2012. They were agreeable to start.   DVT history on xarelto and asa  therapy prior to admission. Considered a Xarelto failure.  Cardiomyopathy, EF 15%, unknown cause (cardiotoxicity w/chemo)  Ovarian cancer (Lap for debulking) with hypercoaguability  Long term medication use  Acute on chronic systolic heart failure, Dr. Mendel Ryder  Hypokalemia - resolved  Nonsustained ventricular tachycardia  Thrombocytopenia 138>>>103>>>75>68  Hospital day # 6  TREATMENT/PLAN  Continue heparin for secondary stroke prevention until Coumadin is therapeutic of 2.0. (1.30 today). We  plan to keep patient on heparin one more day. If INR is not therapeutic will stop heparin (sooner if HIT comes back). At that time, will manage with full dose lovenox and discharge. Platelet levels were discussed with the patient and will be continually monitored.   No further therapies recommended.  HIT panel ordered, results pending  Serial CBCs: HGB stable.  Cardiology: cardiac MRI, done today, report pending   I talked with Dr. Judie Petit. Shirline Frees 12/09/2012 who recommended no oncology treatments during this hospital illness, but to make appointment to see them after discharge. He did say that the protime could be followed there if hypercoagulability (most likely active cancer) was felt to be the cause of clotting disorder.  Gwendolyn Lima. Manson Passey, [facility], MBA, MHA [facility] Pager: [phone] 12/10/2012 7:53 AM  I have personally obtained a history,  examined the patient, evaluated imaging results, and formulated the assessment and plan of care. I agree with the above.  Delia Heady, MD

## 2012-12-11 ENCOUNTER — Ambulatory Visit: Payer: Medicare Other | Admitting: Gynecologic Oncology

## 2012-12-11 ENCOUNTER — Telehealth: Payer: Self-pay | Admitting: Oncology

## 2012-12-11 DIAGNOSIS — I4729 Other ventricular tachycardia: Secondary | ICD-10-CM | POA: Diagnosis not present

## 2012-12-11 DIAGNOSIS — I63432 Cerebral infarction due to embolism of left posterior cerebral artery: Secondary | ICD-10-CM | POA: Diagnosis present

## 2012-12-11 DIAGNOSIS — Z79899 Other long term (current) drug therapy: Secondary | ICD-10-CM

## 2012-12-11 DIAGNOSIS — I427 Cardiomyopathy due to drug and external agent: Secondary | ICD-10-CM | POA: Diagnosis present

## 2012-12-11 DIAGNOSIS — D6859 Other primary thrombophilia: Secondary | ICD-10-CM | POA: Diagnosis present

## 2012-12-11 DIAGNOSIS — D696 Thrombocytopenia, unspecified: Secondary | ICD-10-CM | POA: Diagnosis present

## 2012-12-11 DIAGNOSIS — IMO0002 Reserved for concepts with insufficient information to code with codable children: Secondary | ICD-10-CM

## 2012-12-11 DIAGNOSIS — I619 Nontraumatic intracerebral hemorrhage, unspecified: Secondary | ICD-10-CM | POA: Diagnosis not present

## 2012-12-11 DIAGNOSIS — I472 Ventricular tachycardia: Secondary | ICD-10-CM | POA: Diagnosis not present

## 2012-12-11 DIAGNOSIS — E876 Hypokalemia: Secondary | ICD-10-CM | POA: Diagnosis not present

## 2012-12-11 DIAGNOSIS — I5023 Acute on chronic systolic (congestive) heart failure: Secondary | ICD-10-CM | POA: Diagnosis present

## 2012-12-11 DIAGNOSIS — Z86718 Personal history of other venous thrombosis and embolism: Secondary | ICD-10-CM

## 2012-12-11 LAB — CBC
MCH: 30.3 pg (ref 26.0–34.0)
MCHC: 33.5 g/dL (ref 30.0–36.0)
MCV: 90.4 fL (ref 78.0–100.0)
Platelets: 73 10*3/uL — ABNORMAL LOW (ref 150–400)
RBC: 3.63 MIL/uL — ABNORMAL LOW (ref 3.87–5.11)

## 2012-12-11 LAB — HEPARIN INDUCED THROMBOCYTOPENIA PNL
Patient O.D.: 0.124
UFH High Dose UFH H: 0 % Release
UFH Low Dose 0.1 IU/mL: 0 % Release
UFH Low Dose 0.5 IU/mL: 0 % Release
UFH SRA Result: NEGATIVE

## 2012-12-11 LAB — PROTIME-INR: Prothrombin Time: 22.5 seconds — ABNORMAL HIGH (ref 11.6–15.2)

## 2012-12-11 MED ORDER — METOPROLOL SUCCINATE 12.5 MG HALF TABLET: 12.5000 mg | ORAL_TABLET | Freq: Every day | ORAL | Status: DC

## 2012-12-11 MED ORDER — WARFARIN SODIUM 10 MG PO TABS: 10.0000 mg | ORAL_TABLET | Freq: Once | ORAL | Status: DC

## 2012-12-11 MED ORDER — SPIRONOLACTONE 25 MG PO TABS: 25.0000 mg | ORAL_TABLET | Freq: Every day | ORAL | Status: DC

## 2012-12-11 MED ORDER — LISINOPRIL 2.5 MG PO TABS: 2.5000 mg | ORAL_TABLET | Freq: Every day | ORAL | Status: DC

## 2012-12-11 MED ORDER — WARFARIN SODIUM 10 MG PO TABS
10.0000 mg | ORAL_TABLET | Freq: Once | ORAL | Status: DC
  Filled 2012-12-11: qty 1

## 2012-12-11 NOTE — Progress Notes (Signed)
Stroke Team Progress Note  HISTORY Monique [name] is an 67 y.o. female with a past medical history significant for ovarian cancer currently undergoing chemotherapy with gemcitabine, newly diagnosed systolic heart failure with EF 15%, chronic anticoagulation on xarelto due to bilateral DVT s/p IVC filter placement, admitted to [facility] with new onset dysarthria. She stated that she went to bed on the evening of 12/03/2012 feeling well, but approximately 1 hour after waking up on 12/04/2012 noticed that her speech was slurred and she couldn't talk properly. Never had similar symptoms before. She denied associated headache, vertigo, double vision, difficulty swallowing, confusion, or unsteadiness.   She presented to [facility] ED for further evaluation and management but was out of the window for intravenous thrombolysis.  CT brain upon arrival to ED showed no acute abnormality, but a subsequent MRI-DWI disclosed scattered. small acute non hemorrhagic infarcts involving portions of the left sub insular region, left frontal lobe, left parietal lobe, occipital lobe bilaterally and cerebellum bilaterally.   She was on xarelto prior to admission. Patient was noted early in the morning of 12/05/12 to have increased aphasia, right arm weakness and difficulty ambulating. repeat CT head showed hemorrhage left occipital lobe suggests hemorrhagic transformation. Patient was transferred to [facility] to 3100 to be placed on the stroke service. Patients Xarelto was held.   Patient was not a TPA candidate secondary to out of window. She was admitted to the neuro ICU for further evaluation and treatment.  SUBJECTIVE  Patient up in bed. Has been walking the halls. Happy that INR is at goal. Speech is better.  OBJECTIVE Most recent Vital Signs: Filed Vitals:   12/10/12 1516 12/10/12 1732 12/10/12 2133 12/11/12 0133  BP: 116/59 113/54 95/50 103/46  Pulse: 70 75 61 60  Temp: 97.9 F (36.6 C) 98.3 F (36.8 C) 98 F (36.7 C) 98 F (36.7  C)  TempSrc: Oral Oral Oral Oral  Resp: 20 20 18 18   Height:      Weight:      SpO2: 99% 99% 100% 100%   CBG (last 3)  No results found for this basename: GLUCAP,  in the last 72 hours  IV Fluid Intake:   . sodium chloride 10 mL/hr at 12/08/12 0952  . heparin 1,150 Units/hr (12/11/12 0543)    MEDICATIONS  . lisinopril  2.5 mg Oral Daily  . metoprolol succinate  12.5 mg Oral Daily  . pantoprazole  40 mg Oral Daily  . spironolactone  25 mg Oral Daily  . Warfarin - Pharmacist Dosing Inpatient   Does not apply q1800   PRN:  acetaminophen, acetaminophen, labetalol, LORazepam  Diet:  Cardiac thin liquids Activity:  Bedrest DVT Prophylaxis:  IV heparin infusion  CLINICALLY SIGNIFICANT STUDIES Basic Metabolic Panel:   Recent Labs Lab 12/05/12 1155 12/06/12 1100 12/07/12 1018  NA 137 137 138  K 4.1 3.4* 4.0  CL 101 103 105  CO2 26 24 22   GLUCOSE 98 109* 99  BUN 12 12 11   CREATININE 0.61 0.54 0.60  CALCIUM 9.7 9.2 9.6  MG  --  1.9  --    Liver Function Tests:   Recent Labs Lab 12/04/12 1220 12/05/12 1155  AST 23 29  ALT 18 18  ALKPHOS 108 111  BILITOT 0.5 0.6  PROT 6.7 6.4  ALBUMIN 3.5 3.4*   CBC:  Recent Labs Lab 12/04/12 1220  12/10/12 0400 12/11/12 0530  WBC 5.4  < > 4.0 4.2  NEUTROABS 4.6  --   --   --  HGB 11.2*  < > 10.7* 11.0*  HCT 34.0*  < > 31.4* 32.8*  MCV 90.2  < > 89.7 90.4  PLT 120*  < > 68* 73*  < > = values in this interval not displayed. Coagulation:   Recent Labs Lab 12/08/12 0605 12/09/12 0645 12/10/12 0400 12/11/12 0530  LABPROT 13.4 13.8 17.4* 22.5*  INR 1.04 1.08 1.47 2.05*   Cardiac Enzymes:   Recent Labs Lab 12/04/12 2240 12/05/12 0455 12/06/12 0430  TROPONINI 1.00* 1.11* 1.30*   Urinalysis:   Recent Labs Lab 12/04/12 1318  COLORURINE YELLOW  LABSPEC 1.022  PHURINE 6.5  GLUCOSEU NEGATIVE  HGBUR MODERATE*  BILIRUBINUR NEGATIVE  KETONESUR NEGATIVE  PROTEINUR NEGATIVE  UROBILINOGEN 0.2  NITRITE  NEGATIVE  LEUKOCYTESUR NEGATIVE   Lipid Panel    Component Value Date/Time   CHOL 148 12/05/2012 0455   TRIG 89 12/05/2012 0455   HDL 43 12/05/2012 0455   CHOLHDL 3.4 12/05/2012 0455   VLDL 18 12/05/2012 0455   LDLCALC 87 12/05/2012 0455   HgbA1C  Lab Results  Component Value Date   HGBA1C 5.4 12/04/2012    Urine Drug Screen:   No results found for this basename: labopia,  cocainscrnur,  labbenz,  amphetmu,  thcu,  labbarb    Alcohol Level: No results found for this basename: ETH,  in the last 168 hours  Ct Head Wo Contrast 12/06/2012 :  1. Slightly decreased hemorrhage within the medial left occipital lobe. 2.  No new focus of intracranial hemorrhage.   12/05/2012   Hemorrhage left occipital lobe suggests hemorrhagic transformation of infarct in this region.  Scattered infarcts supratentorially (greater on the left) and within the cerebellum noted but better visualized on recent MR. It is difficult by CT examination to determine if there has been extension of these infarcts.  12/04/2012    Ventricular enlargement without corresponding sulcal prominence. 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  No mass, hemorrhage, or acute appearing infarct.  No gray-white compartment lesions are identified.     Mr Angiogram Head Wo Contrast 12/04/2012    : Anterior circulation without medium or large size vessel significant stenosis or occlusion.  Fetal type contribution to the posterior cerebral arteries.  Ectatic vertebral arteries and basilar artery without high-grade stenosis.  Non-visualized right PICA and left AICA with narrowing and irregularity of the left PICA and right AICA.  Slightly bulbous appearance of the basilar tip and superior medial margin right posterior cerebral artery P1 segment without definitive saccular aneurysm.  Duplicated right superior cerebellar artery.  Anterior and posterior branch vessel irregularity.    Mr Brain Wo Contrast 12/05/2012    Numerous small  acute infarcts bilaterally with progression since yesterday.  These findings suggest recurrent emboli.  Hemorrhage in the left occipital infarct is best seen on the CT earlier today.  Ventricular enlargement stable.  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  12/04/2012    Scattered acute non hemorrhagic infarcts involving portions of the left sub insular region, left frontal lobe, left parietal lobe, occipital lobe bilaterally and cerebellum bilaterally.  This distribution raises possibility embolic disease.  No intracranial hemorrhage.  Scattered nonspecific white matter type changes may reflect result of small vessel disease.  Atrophy.  Ventricular prominence may be related to atrophy although not able to completely exclude a mild component hydrocephalus.    MR CARD 1) Moderate LVE with diffuse hypokinesis EF 27%  2) No hyperenhancement , scar or infiltration of the LV  myocardium  3) Mild LAE  4) Mitral annular dilatation with MR  5) Trivial pericardial effusion   2D Echocardiogram  EF 15%, left atrium moderately dilated  Carotid Doppler  The vertebral arteries appear patent with antegrade flow.- Findings consistent with less than 39 percent stenosis involving the right internal carotid artery and the left internal carotid artery.  CXR  12/06/2012 - 1. Borderline cardiomegaly. 2. Linear subsegmental atelectasis in the left lower lobe.   EKG  normal sinus rhythm.   Therapy Recommendations - no further therapy recommended  Physical Exam   Pleasant elderly Caucasian lady not in distress.Awake alert. Afebrile. Head is nontraumatic. Neck is supple without bruit. Hearing is normal. Cardiac exam no murmur or gallop. Lungs are clear to auscultation. Distal pulses are well felt. Neurological Exam : Awake alert and oriented . Some word finding difficulties and hesitancy. Was able to name 3 successive objects without hesitation.Able to write quite well fluently but speech shows mild dysarthria and  expressive difficulties.  Extraocular movements are full range without nystagmus. Face is mild right lower facial weakness. Tongue is midline. Moves all her extremities well against gravity with mild weakness of right grip and intensity muscles. Gait was not tested. Touch temperature sensation and coordination symmetric.   ASSESSMENT Monique [name] is a 67 y.o. female presenting with severe dysarthria. Imaging confirms scattered acute non hemorrhagic infarcts involving portions of the left sub insular region, left frontal lobe, left parietal lobe, occipital lobe bilaterally and cerebellum bilaterally. Infarcts felt to be embolic secondary to hypercoaguability or cardioembolic from low ejection fraction..  On aspirin 81 mg orally every day and xarelto prior to admission. Now on heparin and coumadin for secondary stroke prevention. Patient with resultant dysarthria. Work up completed.  Dr. Pearlean Brownie spoke to oncologist on call (Dr. Elfredia Nevins) 12/06/12 regarding anticoagulation in this patient. For primary hypercoagulability, full dose lovenox is indicated; however caution for increased risk of hemorrhagic risk in acute stroke setting per literature and iv heparin/warfarin may be safer.. Patient will need to stay on heparin and start coumadin load. Once INR reaches goal (2.0) or higher, stop heparin. Unless other issues, the patient can be discharged only when meeting this goal. Monique [name] had a 20 minute discussion with the patient and husband re: coumadin, risks of bleeding, and goals of care on 12/06/2012. They were agreeable to start.   DVT history on xarelto and asa  therapy prior to admission. Considered a Xarelto failure.  Cardiomyopathy, EF 15%, unknown cause (cardiotoxicity w/chemo)  Ovarian cancer (Lap for debulking) with hypercoaguability  Long term medication use  Acute on chronic systolic heart failure, Dr. Mendel Ryder  Hypokalemia - resolved  Nonsustained ventricular  tachycardia  Thrombocytopenia 138>>>103>>>75>68>73, trending back upward  Hospital day # 7  TREATMENT/PLAN  Continue heparin for secondary stroke prevention until Coumadin is therapeutic of 2.0.  We have reached 2.0 today, so we will discontinue heparin and plan to discharge her home today. She will need to keep appointment with oncology for continued cancer treatment and so that her INR can be followed at that facility since we feel that her hypercoagulable state is secondary to her cancer.   No further therapies recommended.  HIT panel ordered, results pending  Serial CBCs: HGB stable  Patient states that she has an appt with Dr. Darrold Span tomorrow. I explained that she would need her coumadin level checked in am as well. She at one time told me that she may have another appt on Monday 12/16/2012 and I have asked her to  have her INR drawn at that visit as well. She should have a level drawn by then at the latest. We have asked that if oncology does not follow her INR, that she needs to be seen at Dr. Lonn Georgia office for coumadin clinic.  I talked with Dr. Judie Petit. Shirline Frees 12/09/2012 who recommended no oncology treatments during this hospital illness, but to make appointment to see them after discharge. He did say that the protime could be followed there if hypercoagulability (most likely active cancer) was felt to be the cause of clotting disorder.  Gwendolyn Lima. Manson Passey, PAC, MBA, MHA [facility] Pager: [phone] 12/11/2012 8:00 AM  I have personally obtained a history, examined the patient, evaluated imaging results, and formulated the assessment and plan of care. I agree with the above.   Delia Heady, MD

## 2012-12-11 NOTE — Telephone Encounter (Signed)
m °

## 2012-12-11 NOTE — Discharge Summary (Signed)
Stroke Discharge Summary  Patient ID: Monique [name]   MRN: [id number]      DOB: 24-Jul-1945  Date of Admission: 12/04/2012 Date of Discharge: 12/11/2012  Attending Physician:  Darcella Cheshire, MD, Stroke MD  Consulting Physician(s):   Treatment Team:  Lesleigh Noe, MD  Patient's PCP:  Aura Dials, MD  Discharge Diagnoses:  Principal Problem:  Multiple bicerebral Infarcts felt to be embolic secondary to hypercoaguability from ovarian cancer or cardioembolic from low ejection fraction.     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  Past Medical History  Diagnosis Date  . Arthritis   . Acute venous embolism and thrombosis of unspecified deep vessels of lower extremity   . Bronchitis     several times  . Abdominal or pelvic swelling, mass or lump, unspecified site   . Phlebitis and thrombophlebitis of unspecified site   . Pelvic mass   . Skin cancer   . Ovarian cancer   . Anxiety   . GERD (gastroesophageal reflux disease)   . Anemia   . PONV (postoperative nausea and vomiting)     "when fentanyl is used, will break out in itchy rast"   Past Surgical History  Procedure Laterality Date  . Appendectomy  1966    ruptured, hospital for 3 weeks  . Rhinoplasty  1976  . Vein ligation and stripping  1998    right leg  . Mohs surgery  2004, 2005    basal cell of the face  . Tooth extraction    .  Laparotomy N/A 09/10/2012    Procedure: EXPLORATORY LAPAROTOMY, BILATERAL SALPINGO OOPHORECTOMY, TUMOR DEBULKING;  Surgeon: Rejeana Brock A. Duard Brady, MD;  Location: [facility];  Service: Gynecology;  Laterality: N/A;  . Exploratory laparotomy  09/10/12    Lysis of adhesions, BSO, suboptimal tumor debulking      Medication List    STOP taking these medications       aspirin EC 81 MG tablet     hyoscyamine 0.125 MG tablet  Commonly known as:  [name], ANASPAZ     multivitamin with minerals Tabs tablet     Rivaroxaban 20 MG Tabs tablet  Commonly known as:  XARELTO     senna 8.6 MG tablet  Commonly known as:  SENOKOT     sucralfate 1 G tablet  Commonly known as:  CARAFATE      TAKE these medications       lisinopril 2.5 MG tablet  Commonly known as:  PRINIVIL,ZESTRIL  Take 1 tablet (2.5 mg total) by mouth daily.     LORazepam 0.5 MG tablet  Commonly known as:  ATIVAN  Take 0.5 mg by mouth every 8 (eight) hours as needed for anxiety (takes after Chemo treatment).     metoprolol succinate 12.5 mg Tb24 24 hr tablet  Commonly known as:  TOPROL-XL  Take 0.5 tablets (12.5 mg total) by mouth daily.  pantoprazole 40 MG tablet  Commonly known as:  PROTONIX  Take 1 tablet (40 mg total) by mouth daily.     spironolactone 25 MG tablet  Commonly known as:  ALDACTONE  Take 1 tablet (25 mg total) by mouth daily.     warfarin 10 MG tablet  Commonly known as:  COUMADIN  Take 1 tablet (10 mg total) by mouth one time only at 6 PM.        LABORATORY STUDIES CBC    Component Value Date/Time   WBC 4.2 12/11/2012 0530   WBC 6.1 12/02/2012 0843   RBC 3.63* 12/11/2012 0530   RBC 3.89 12/02/2012 0843   HGB 11.0* 12/11/2012 0530   HGB 11.5* 12/02/2012 0843   HCT 32.8* 12/11/2012 0530   HCT 35.9 12/02/2012 0843   PLT 73* 12/11/2012 0530   PLT 138* 12/02/2012 0843   MCV 90.4 12/11/2012 0530   MCV 92.3 12/02/2012 0843   MCH 30.3 12/11/2012 0530   MCH 29.6 12/02/2012 0843   MCHC 33.5 12/11/2012 0530    MCHC 32.0 12/02/2012 0843   RDW 15.7* 12/11/2012 0530   RDW 16.0* 12/02/2012 0843   LYMPHSABS 0.5* 12/04/2012 1220   LYMPHSABS 1.2 12/02/2012 0843   MONOABS 0.3 12/04/2012 1220   MONOABS 0.7 12/02/2012 0843   EOSABS 0.1 12/04/2012 1220   EOSABS 0.1 12/02/2012 0843   BASOSABS 0.0 12/04/2012 1220   BASOSABS 0.0 12/02/2012 0843   CMP    Component Value Date/Time   NA 138 12/07/2012 1018   NA 143 11/28/2012 1020   K 4.0 12/07/2012 1018   K 4.5 11/28/2012 1020   CL 105 12/07/2012 1018   CL 104 10/15/2012 1043   CO2 22 12/07/2012 1018   CO2 27 11/28/2012 1020   GLUCOSE 99 12/07/2012 1018   GLUCOSE 84 11/28/2012 1020   GLUCOSE 114* 10/15/2012 1043   BUN 11 12/07/2012 1018   BUN 15.0 11/28/2012 1020   CREATININE 0.60 12/07/2012 1018   CREATININE 0.7 11/28/2012 1020   CALCIUM 9.6 12/07/2012 1018   CALCIUM 9.8 11/28/2012 1020   PROT 6.4 12/05/2012 1155   PROT 6.8 11/14/2012 1416   ALBUMIN 3.4* 12/05/2012 1155   ALBUMIN 3.5 11/14/2012 1416   AST 29 12/05/2012 1155   AST 22 11/14/2012 1416   ALT 18 12/05/2012 1155   ALT 21 11/14/2012 1416   ALKPHOS 111 12/05/2012 1155   ALKPHOS 95 11/14/2012 1416   BILITOT 0.6 12/05/2012 1155   BILITOT 0.30 11/14/2012 1416   GFRNONAA >90 12/07/2012 1018   GFRAA >90 12/07/2012 1018   COAGS Lab Results  Component Value Date   INR 2.05* 12/11/2012   INR 1.47 12/10/2012   INR 1.08 12/09/2012   Lipid Panel    Component Value Date/Time   CHOL 148 12/05/2012 0455   TRIG 89 12/05/2012 0455   HDL 43 12/05/2012 0455   CHOLHDL 3.4 12/05/2012 0455   VLDL 18 12/05/2012 0455   LDLCALC 87 12/05/2012 0455   HgbA1C  Lab Results  Component Value Date   HGBA1C 5.4 12/04/2012   Cardiac Panel (last 3 results) No results found for this basename: CKTOTAL, CKMB, TROPONINI, RELINDX,  in the last 72 hours Urinalysis    Component Value Date/Time   COLORURINE YELLOW 12/04/2012 1318   APPEARANCEUR CLEAR 12/04/2012 1318   LABSPEC 1.022 12/04/2012 1318   PHURINE 6.5 12/04/2012 1318   GLUCOSEU NEGATIVE 12/04/2012  1318   HGBUR MODERATE* 12/04/2012 1318   BILIRUBINUR NEGATIVE 12/04/2012 1318  KETONESUR NEGATIVE 12/04/2012 1318   PROTEINUR NEGATIVE 12/04/2012 1318   UROBILINOGEN 0.2 12/04/2012 1318   NITRITE NEGATIVE 12/04/2012 1318   LEUKOCYTESUR NEGATIVE 12/04/2012 1318   Urine Drug Screen  No results found for this basename: labopia,  cocainscrnur,  labbenz,  amphetmu,  thcu,  labbarb    Alcohol Level No results found for this basename: eth     SIGNIFICANT DIAGNOSTIC STUDIES  Ct Head Wo Contrast  12/06/2012 : 1. Slightly decreased hemorrhage within the medial left occipital lobe. 2. No new focus of intracranial hemorrhage.  12/05/2012 Hemorrhage left occipital lobe suggests hemorrhagic transformation of infarct in this region. Scattered infarcts supratentorially (greater on the left) and within the cerebellum noted but better visualized on recent MR. It is difficult by CT examination to determine if there has been extension of these infarcts.  12/04/2012 Ventricular enlargement without corresponding sulcal prominence. 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 No mass, hemorrhage, or acute appearing infarct. No gray-white compartment lesions are identified.   Mr Angiogram Head Wo Contrast  12/04/2012 : Anterior circulation without medium or large size vessel significant stenosis or occlusion. Fetal type contribution to the posterior cerebral arteries. Ectatic vertebral arteries and basilar artery without high-grade stenosis. Non-visualized right PICA and left AICA with narrowing and irregularity of the left PICA and right AICA. Slightly bulbous appearance of the basilar tip and superior medial margin right posterior cerebral artery P1 segment without definitive saccular aneurysm. Duplicated right superior cerebellar artery. Anterior and posterior branch vessel irregularity.   Mr Brain Wo Contrast  12/05/2012 Numerous small acute infarcts bilaterally with progression since yesterday.  These findings suggest recurrent emboli. Hemorrhage in the left occipital infarct is best seen on the CT earlier today. Ventricular enlargement stable. 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  12/04/2012 Scattered acute non hemorrhagic infarcts involving portions of the left sub insular region, left frontal lobe, left parietal lobe, occipital lobe bilaterally and cerebellum bilaterally. This distribution raises possibility embolic disease. No intracranial hemorrhage. Scattered nonspecific white matter type changes may reflect result of small vessel disease. Atrophy. Ventricular prominence may be related to atrophy although not able to completely exclude a mild component hydrocephalus.   MR CARD  1) Moderate LVE with diffuse hypokinesis EF 27%  2) No hyperenhancement , scar or infiltration of the LV  myocardium  3) Mild LAE  4) Mitral annular dilatation with MR  5) Trivial pericardial effusion   2D Echocardiogram EF 15%, left atrium moderately dilated   Carotid Doppler The vertebral arteries appear patent with antegrade flow.- Findings consistent with less than 39 percent stenosis involving the right internal carotid artery and the left internal carotid artery.    History of Present Illness     Monique [name] is an 68 y.o. female with a past medical history significant for ovarian cancer currently undergoing chemotherapy with gemcitabine, newly diagnosed systolic heart failure with EF 15%, chronic anticoagulation on xarelto due to bilateral DVT s/p IVC filter placement, admitted to [facility] with new onset dysarthria. She stated that she went to bed on the evening of 12/03/2012 feeling well, but approximately 1 hour after waking up on 12/04/2012 noticed that her speech was slurred and she couldn't talk properly. Never had similar symptoms before. She denied associated headache, vertigo, double vision, difficulty swallowing, confusion, or unsteadiness. She presented to [facility] ED for further evaluation and  management but was out of the window for intravenous thrombolysis.   CT brain upon arrival to ED showed no acute abnormality, but a subsequent MRI-DWI  disclosed scattered. small acute non hemorrhagic infarcts involving portions of the left sub insular region, left frontal lobe, left parietal lobe, occipital lobe bilaterally and cerebellum bilaterally.   She was on xarelto prior to admission.   Patient was noted early in the morning of 12/05/12 to have increased aphasia, right arm weakness and difficulty ambulating. repeat CT head showed hemorrhage left occipital lobe suggests hemorrhagic transformation. Patient was transferred to [facility] to 3100 to be placed on the stroke service. Patients Xarelto was held.    Hospital Course   Monique [name] is a 67 y.o. female presenting with severe dysarthria. Imaging confirms scattered acute non hemorrhagic infarcts involving portions of the left sub insular region, left frontal lobe, left parietal lobe, occipital lobe bilaterally and cerebellum bilaterally. Infarcts felt to be embolic secondary to hypercoaguability or cardioembolic from low ejection fraction.. On aspirin 81 mg orally every day and xarelto prior to admission. Now on coumadin with therapeutic INR with current goal 2.0-3.0. Patient with resultant dysarthria. Work up completed.   Dr. Pearlean Brownie spoke to oncologist on call (Dr. Elfredia Nevins) 12/06/12 regarding anticoagulation in this patient. For primary hypercoagulability, full dose lovenox is indicated; however caution for increased risk of hemorrhagic risk in acute stroke setting per literature and iv heparin/warfarin may be safer.. Patient kept on heparin and start coumadin load. INR is at goal (2.0). Guy Franco PA-C had a 20 minute discussion with the patient and husband re: coumadin, risks of bleeding, and goals of care on 12/06/2012. They were agreeable to start.  DVT history on xarelto and asa therapy prior to admission. Now considered a Xarelto failure.   Cardiomyopathy, EF 15%, (Cardiac MR shows EF-27%), likely cardiotoxicity secondary to chemo Ovarian cancer (Lap for debulking) with hypercoaguability  Long term medication use  Acute on chronic systolic heart failure, Dr. Mendel Ryder  Hypokalemia - resolved  Nonsustained ventricular tachycardia  Thrombocytopenia 138>>>103>>>75>68>73, trending back upward. A HIT panel was ordered and is pending. The platelet count is starting to trend upward. This trending also coincides with her last chemo dose~12/02/2012, so this may not be heparin induced but question chemo related. Await study.  PLAN  We have reached 2.0 today, so we will discontinue heparin and plan to discharge her home today. She will need to keep appointment with oncology for continued cancer treatment and so that her INR can be followed at that facility since we feel that her hypercoagulable state is secondary to her cancer.  No further therapies recommended.  HIT panel ordered, results pending  Serial CBCs: HGB stable  Patient states that she has an appt with Dr. Darrold Span tomorrow. I explained that she would need her coumadin level checked in am as well. She at one time told me that she may have another appt on Monday 12/16/2012 and I have asked her to have her INR drawn at that visit as well. She should have a level drawn by then at the latest. We have asked that if oncology does not follow her INR, that she needs to be seen at Dr. Lonn Georgia office for coumadin clinic. Follow up with Dr. Pearlean Brownie in 2 months.  Patient with continued stroke symptoms of dysarthria. Physical therapy, occupational therapy and speech therapy evaluated patient. They recommend outpatient.  Discharge Exam  Blood pressure 107/54, pulse 63, temperature 97.9 F (36.6 C), temperature source Oral, resp. rate 18, height 5\' 7"  (1.702 m), weight 71.85 kg (158 lb 6.4 oz), SpO2 99.00%.    Physical Exam  Pleasant elderly Caucasian lady  not in distress.Awake alert. Afebrile. Head  is nontraumatic. Neck is supple without bruit. Hearing is normal. Cardiac exam no murmur or gallop. Lungs are clear to auscultation. Distal pulses are well felt.  Neurological Exam : Awake alert and oriented . Some word finding difficulties and hesitancy. Was able to name 3 successive objects without hesitation.Able to write quite well fluently but speech shows mild dysarthria and expressive difficulties. Extraocular movements are full range without nystagmus. Face is mild right lower facial weakness. Tongue is midline. Moves all her extremities well against gravity with mild weakness of right grip and intensity muscles. Gait was not tested. Touch temperature sensation and coordination symmetric   Discharge Diet   Cardiac thin liquids  Discharge Plan    Disposition:  home   warfarin for secondary stroke prevention.  Ongoing risk factor control by Primary Care Physician. Risk factor recommendations:  Hypertension target range 130-140/70-80 Lipid range - LDL < 100 and checked every 6 months, fasting Diabetes - HgB A1C <7   Follow-up BOUSKA,DAVID E, MD in 1 month.  Follow-up with Dr. Delia Heady, Stroke Clinic in 2 months.  35 minutes were spent preparing discharge.  Signed  Gwendolyn Lima. Manson Passey, PAC, MBA, MHA [facility] Pager: [phone] 12/11/2012 1:25 PM  I have personally examined this patient, reviewed pertinent data and developed the plan of care. I agree with above.  Delia Heady, MD

## 2012-12-11 NOTE — Progress Notes (Signed)
ANTICOAGULATION CONSULT NOTE - Follow Up Consult  Pharmacy Consult for Heparin and Coumadin Indication: hx DVT/embolic stroke  Patient Measurements: Height: 5\' 7"  (170.2 cm) Weight: 158 lb 6.4 oz (71.85 kg) IBW/kg (Calculated) : 61.6 Heparin Dosing Weight: 69kg  Vital Signs: Temp: 98 F (36.7 C) (08/20 0133) Temp src: Oral (08/20 0133) BP: 103/46 mmHg (08/20 0133) Pulse Rate: 60 (08/20 0133)  Labs:  Recent Labs  12/09/12 0645 12/10/12 0400 12/11/12 0530  HGB 10.6* 10.7* 11.0*  HCT 30.9* 31.4* 32.8*  PLT 75* 68* 73*  LABPROT 13.8 17.4* 22.5*  INR 1.08 1.47 2.05*  HEPARINUNFRC 0.31 0.35 0.41    Estimated Creatinine Clearance: 68.2 ml/min (by C-G formula based on Cr of 0.6).   Medications:  Heparin 1150 units/hr  Assessment: 66YOF with hx DVT on Xarelto PTA with last dose 8/13 AM. Due to embolic stroke pts primary hypercoagulability, she has been transitioned to heparin bridging to Coumadin. Heparin level remains therapeutic at 0.41 this morning. INR is therapeutic this morning at 2.05. Would expect heparin to be stopped and patient to be discharged soon.  Hgb stable, Plts trended down- HIT panel sent 8/18 per cards- still pending. Platelets are 73 today which is a slight increase.  No significant bleeding reported.  Had lengthy conversation with patient yesterday about warfarin and especially about diet. Patient told me she enjoys many of the vegetables containing vitamin K. I reinforced that she does not need to cut them out completely, but she should keep her intake consistent- I suggested starting with 3-4 servings/week and ensuring her INR is followed closely.   Goal of Therapy:  Heparin level 0.3-0.5 units/ml INR ~2 per Neuro Monitor platelets by anticoagulation protocol: Yes   Plan:  1. Continue heparin drip at 1150 units/hr until stopped 2. With patient's diet preferences in mind, recommend she continues to take warfarin 10mg  daily and has her INR checked  Monday 8/25. 3. Daily INR if not discharged 4. Monitor for any signs of bleeding 5. Follow up HIT panel  Norelle Runnion D. Ronnell Makarewicz, PharmD Clinical Pharmacist Pager: [phone] 12/11/2012 9:05 AM

## 2012-12-11 NOTE — Progress Notes (Signed)
Speech Language Pathology Treatment Patient Details Name: Monique [name] MRN: [id number] DOB: 09-Dec-1945 Today's Date: 12/11/2012 Time: 1030-1047 SLP Time Calculation (min): 17 min  Assessment / Plan / Recommendation Clinical Impression  F/u for diagnostic treatment to address intelligibility of speech.  Patient demonstrates awareness of articulation errors and able to self correct.  Speech errors noticeable  but connected speech intelligible.  Communication skills functional in current setting.  ST to sign off as goals met and education complete. F/u in Outpatient setting if warranted.     SLP Plan  All goals met;Discharge SLP treatment due to (comment)       SLP Goals  SLP Goals SLP Goal #1 - Progress: Met SLP Goal #2 - Progress: Met  General Temperature Spikes Noted: No Respiratory Status: Room air Behavior/Cognition: Alert;Cooperative;Pleasant mood Oral Cavity - Dentition: Adequate natural dentition Patient Positioning: Upright in bed  Oral Cavity - Oral Hygiene Does patient have any of the following "at risk" factors?: None of the above Brush patient's teeth BID with toothbrush (using toothpaste with fluoride): Yes   Treatment Treatment focused on: Apraxia   GO    Moreen Fowler MS, CCC-SLP [phone] [facility] 12/11/2012, 10:51 AM

## 2012-12-11 NOTE — Progress Notes (Signed)
Patient Name: Monique [name] Date of Encounter: 12/11/2012    SUBJECTIVE: No chest pain or dyspnea.  TELEMETRY:  Review reveals 2 episodes of NSVT since admission up to 12 beats: Filed Vitals:   12/10/12 1516 12/10/12 1732 12/10/12 2133 12/11/12 0133  BP: 116/59 113/54 95/50 103/46  Pulse: 70 75 61 60  Temp: 97.9 F (36.6 C) 98.3 F (36.8 C) 98 F (36.7 C) 98 F (36.7 C)  TempSrc: Oral Oral Oral Oral  Resp: 20 20 18 18   Height:      Weight:      SpO2: 99% 99% 100% 100%    Intake/Output Summary (Last 24 hours) at 12/11/12 0830 Last data filed at 12/11/12 0700  Gross per 24 hour  Intake    480 ml  Output      0 ml  Net    480 ml    LABS: Basic Metabolic Panel: No results found for this basename: NA, K, CL, CO2, GLUCOSE, BUN, CREATININE, CALCIUM, MG, PHOS,  in the last 72 hours CBC:  Recent Labs  12/10/12 0400 12/11/12 0530  WBC 4.0 4.2  HGB 10.7* 11.0*  HCT 31.4* 32.8*  MCV 89.7 90.4  PLT 68* 73*   Physical Exam: Blood pressure 103/46, pulse 60, temperature 98 F (36.7 C), temperature source Oral, resp. rate 18, height 5\' 7"  (1.702 m), weight 71.85 kg (158 lb 6.4 oz), SpO2 100.00%. Weight change:    S4 gallop  ASSESSMENT:  1. Systolic heart failure with NSVT(asymptomatic) 2. Anticoagulation now with INR > 2.0 3. Ovarian cancer, metastatic 4. Probable embolic stroke   Plan:  1. Increase beta blocker dose. If BP is threatened, I would sacrifice Lisinopril since on aldactone 2. Call if we can help further.  Selinda Eon 12/11/2012, 8:30 AM

## 2012-12-12 ENCOUNTER — Ambulatory Visit: Payer: Medicare Other | Attending: Oncology | Admitting: Oncology

## 2012-12-12 ENCOUNTER — Encounter: Payer: Self-pay | Admitting: Oncology

## 2012-12-12 ENCOUNTER — Ambulatory Visit (HOSPITAL_BASED_OUTPATIENT_CLINIC_OR_DEPARTMENT_OTHER): Payer: Medicare Other | Admitting: Lab

## 2012-12-12 ENCOUNTER — Other Ambulatory Visit: Payer: Self-pay | Admitting: Gynecologic Oncology

## 2012-12-12 ENCOUNTER — Telehealth: Payer: Self-pay | Admitting: Oncology

## 2012-12-12 ENCOUNTER — Other Ambulatory Visit: Payer: Self-pay | Admitting: Oncology

## 2012-12-12 VITALS — BP 126/80 | HR 75 | Temp 98.5°F | Resp 20 | Ht 67.0 in | Wt 145.8 lb

## 2012-12-12 DIAGNOSIS — C569 Malignant neoplasm of unspecified ovary: Secondary | ICD-10-CM

## 2012-12-12 DIAGNOSIS — Z8673 Personal history of transient ischemic attack (TIA), and cerebral infarction without residual deficits: Secondary | ICD-10-CM

## 2012-12-12 DIAGNOSIS — I6340 Cerebral infarction due to embolism of unspecified cerebral artery: Secondary | ICD-10-CM

## 2012-12-12 DIAGNOSIS — I82403 Acute embolism and thrombosis of unspecified deep veins of lower extremity, bilateral: Secondary | ICD-10-CM

## 2012-12-12 DIAGNOSIS — I428 Other cardiomyopathies: Secondary | ICD-10-CM

## 2012-12-12 DIAGNOSIS — I63432 Cerebral infarction due to embolism of left posterior cerebral artery: Secondary | ICD-10-CM

## 2012-12-12 LAB — PROTIME-INR

## 2012-12-12 LAB — CBC WITH DIFFERENTIAL/PLATELET
Basophils Absolute: 0 10*3/uL (ref 0.0–0.1)
Eosinophils Absolute: 0.1 10*3/uL (ref 0.0–0.5)
HCT: 34.9 % (ref 34.8–46.6)
HGB: 11.7 g/dL (ref 11.6–15.9)
MCV: 90.8 fL (ref 79.5–101.0)
NEUT#: 3.4 10*3/uL (ref 1.5–6.5)
NEUT%: 68.2 % (ref 38.4–76.8)
RDW: 17.1 % — ABNORMAL HIGH (ref 11.2–14.5)
lymph#: 0.8 10*3/uL — ABNORMAL LOW (ref 0.9–3.3)

## 2012-12-12 LAB — PROTHROMBIN TIME: INR: 3.71 — ABNORMAL HIGH (ref <1.50)

## 2012-12-12 NOTE — Patient Instructions (Signed)
INR high today 3.71.  Do not take coumadin today. We will recheck coumadin on 8-22 and you will see pharmacist in coumadin clinic at [facility] that day. You will see Dr Darrold Span again on Mon 8-25 with recheck of coumadin and CBC that day. Call Cancer Center [phone] if any bleeding. Call neurology [phone] if different neurologic symptoms.

## 2012-12-12 NOTE — Progress Notes (Signed)
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 She was followed in hospital by Dr Pearlean Brownie of neurology/ stroke team and by Dr Verdis Prime due to recently diagnosed cardiomyopathy (EF 15% echo and 27% MR). She has first gemcitabine 12-02-12 prior to admission with the acute CVA on 12-04-12.  She is to see Dr Verdis Prime in ~ 3 weeks and Dr Demetrius Charity.Sethi in ~ 2 months. INR today is 3.71 and platelets are 66K. Speech reportedly is some improved, still dysarthria. She has had no bleeding other than minimal at gums with brushing teeth this AM. She denies chest pain, DOE, palpitations since discharge. She had brief (<72min) decreased vision OD with drooping right face and tingling right hand last pm after discharge, resolved promptly. She does not have PAC.  Oncologic History  Patient developed constipation with some right abdominal pain in May 2013. She saw GI physician in ~ Aug 2013, but was unable to tolerate prep for colonoscopy such that this was never accomplished. Constipation improved with probiotics and Activia. She had abdominal fullness more recently, with early satiety and some reflux symptoms. She developed LLE superficial phlebitis in Jan 2014 (dopplers by PCP negative for DVT then), followed by swelling and discomfort in left medial thigh with venous doppler at [facility] in [place] [facility] on 05-21-12 with acute thrombus left common femoral vein. 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 She was begun on lovenox and transitioned to Xarelto by Dr Everlene Other, presently on xarelto 20 mg daily. CA 125 was 569 initially. She saw Dr Duard Brady on 06-12-12, exam remarkable for distended abdomen with fluid wave and 15 cm fixed mass in pelvis. 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 Neoadjuvant dose dense carbo/taxol was beun 06-21-12 with 3 cycles given thru 08-16-12. She needed neupogen days 2,8 and 16 on this regimen. She had good partial response by CTs 08-19-12 and had improvement in CA 125 from 1041 as baseline for the chemotherapy in late Feb 2014 down to 92 on 08-21-12 after 3 cycles. She went to exploratory laparotomy with extensive lysis of adhesions, left ureteral lysis, and bilateral salpingo-oophorectomy by Dr Duard Brady at [facility] on 09-10-12, which was suboptimal debulking. Operative findings: Diffuse carcinomatosis of the residual omentum involving the entire transverse colon. Transverse colon densely adherent to the anterior abdominal wall. No disease throughout the small bowel. Mesenteric adenopathy with lymph nodes measuring 1 cm, involving the root of the small bowel  mesentery. ~5 centimeter right ovarian mass and ~ 6 cm left ovarian mass. Small bowel densely adherent to the pelvis. 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 She resumed chemotherapy with dose dense taxol carboplatin on 09-27-12, completing cycle 5 on 11-08-12.  CA 125 increased from 92 in April to 130 post op in June, then 225 late July and repeat 318. CT CAP done 11-19-12 showed some increase in nodular thickening along falciform ligament and small peritoneal nodule right iliac fossa, no ascites, liver ok, no pulmonary emboli, pulmonary nodules or cardiac concerns. Dr Duard Brady recommended change in chemotherapy to doxil with avastin, however echocardiogram 11-27-12 unexpectedly had EF of 15%. She received one cycle of gemzar on 12-02-12, given by peripheral vein. She developed acute slurring of speech on 12-04-12 and was admitted with acute embolic CVAs from 8-13 thru 12-11-12, treated with heparin and transitioned to coumadin (xarelto [facility]); she had acute worsening of symptoms 12-05-12 with reimaging showing some hemorrhagic transformation left occipital lobe. The initial nonhemorrhagic embolic infarcts were left subinsular, left frontal, left parietal, bilateral occipital and bilateral cerebellar areas. Platelets were low in hospital consistent with gemcitabine given 12-02-12; HIT returned negative. She had further cardiology evaluation in hospital.  Note she had no history of cardiac disease other than palpitations prior to this new diagnosis; she had holter for palpitations several years ago. During my care with initial chemotherapy she had some epigastric symptoms that resolved with H2 blocker, but no complaints of DOE/ chest pain/ different palpitations.  She does not have ophthalmologist (seen last several years ago by optometrist).  Review of systems as above, also: Burning irritation along  peripheral vein RUE during gemzar administration, without residual. Slightly puritic erythematous rash bilateral forearms preceding gemzar, thought possibly from new dish detergent. No apparent nausea from gemzar. No bruising or petechiae. No increased LE swelling now.Bowels moving. Appetite ok. No SOB walking into office today. No cough or other respiratory symptoms. No other pain. Bladder ok. Remainder of 10 point Review of Systems negative.  Objective:  Vital signs in last 24 hours:  BP 126/80  Pulse 75  Temp(Src) 98.5 F (36.9 C) (Oral)  Resp 20  Ht 5\' 7"  (1.702 m)  Wt 145 lb 12.8 oz (66.134 kg)  BMI 22.83 kg/m2  Alert, fully oriented and appropriate, speech mostly intelligible, significant other present for all of visit and exam and very supportive.  HEENT:PERRL, sclera clear, anicteric and oropharynx clear, no lesions. Neck supple without JVD. Mucous membranes moist. Wearing wig. Lymphatics cervical,suraclavicular, axillary or inguinal adenopathy Resp: clear to auscultation bilaterally and normal percussion bilaterally, respirations not labored RA. Back nontender Cardio: regular rate and rhythm, no gallop, clear heart sounds. GI: soft, nontender including epigastrium, not distended, no mass or organomegaly, bowel sounds normally active Extremities: without pitting edema, cords, tenderness Neuro:Speech somewhat dysarthric but intelligible. No facial droop.Alert, appropriate, follows discussion well, easily mobile, strength and sensation symmetrical UE. Skin without petechiae or ecchymoses. Irregular patchy erythema bilateral lower forearms. No streaking or erythema along RUE forearm in area of peripheral gemzar administration, no tenderness or swelling there.  Lab Results:  Results for orders placed in visit on 12/12/12  [facility]  Result Value Range   Protime Sent out for confirmation.  10.6 - 13.4 Seconds   INR Sent out for confirmation  2.00 - 3.50   Lovenox No     CBC WITH DIFFERENTIAL      Result Value Range   WBC 5.0  3.9 - 10.3 10e3/uL   NEUT# 3.4  1.5 - 6.5 10e3/uL   HGB 11.7  11.6 - 15.9 g/dL   HCT 29.5  28.4 - 13.2 %   Platelets 66 (*) 145 - 400 10e3/uL   MCV 90.8  79.5 - 101.0 fL   MCH 30.4  25.1 - 34.0 pg   MCHC 33.4  31.5 - 36.0 g/dL   RBC 4.40  1.02 - 7.25 10e6/uL   RDW 17.1 (*) 11.2 - 14.5 %   lymph# 0.8 (*) 0.9 - 3.3 10e3/uL   MONO# 0.7  0.1 - 0.9 10e3/uL   Eosinophils Absolute 0.1  0.0 - 0.5 10e3/uL   Basophils Absolute 0.0  0.0 - 0.1 10e3/uL   NEUT% 68.2  38.4 - 76.8 %   LYMPH% 15.9  14.0 - 49.7 %   MONO% 13.7  0.0 - 14.0 %   EOS% 1.6  0.0 - 7.0 %   BASO% 0.6  0.0 - 2.0 %  PROTHROMBIN TIME      Result Value Range   Prothrombin Time 35.4 (*) 11.6 - 15.2 seconds   INR 3.71 (*) <1.50   Last CA 125 11-14-12  was  318 and 12-02-12  was   1231  HIT resulted after DC from hospital, negative  Studies/Results: CT CHEST, ABDOMEN AND PELVIS WITH CONTRAST 11-19-12 Technique: Multidetector CT imaging of the chest, abdomen and  pelvis was performed following the standard protocol during bolus  administration of intravenous contrast.  Contrast: OMNIPAQUE IOHEXOL 300 MG/ML SOLN  Comparison: CT 08/19/2012  CT CHEST  Findings: No axillary or supraclavicular lymphadenopathy. There  are nodules within the left and right lobe of the thyroid gland.  The largest in the on the left measures 15 mm. No mediastinal  lymphadenopathy. No central pulmonary embolism. No hilar  adenopathy.  Review of the lung parenchyma demonstrates no suspicious pulmonary  nodules.  IMPRESSION:  1. No evidence of thoracic metastasis.  2. Nodular thyroid gland. Recommend thyroid ultrasound.  CT ABDOMEN AND PELVIS  Findings: No focal parenchymal lesion within the liver. Along the  falciform ligament there is some nodular thickening measuring 13 x  9 mm (image 60, series 2). This is more pronounced than on  comparison exam which measure approximately 7 x 9  mm. The  gallbladder, pancreas, spleen, adrenal glands, kidneys are normal.  The stomach, small bowel and, appendix, and cecum normal. The  colon and rectosigmoid colon are normal.  Abdominal aorta normal caliber. No retroperitoneal periportal  lymphadenopathy.  Peritoneal implant in the right iliac fossa measuring 8 mm (image  83) which is unchanged from prior. No evidence of new peritoneal  disease. There is some ill-defined thickening along the ventral  peritoneal surface at site of midline surgical scar which is not  changed (image 72-78).  In the pelvis, the bladder and uterus are normal. No pelvic  lymphadenopathy. IVC filter in infrarenal location.  Review of bone windows demonstrates no aggressive osseous lesions.  IMPRESSION:  1. Increased nodular thickening along the falciform ligament is  concerning for residual / progressive peritoneal disease.  2. Small peritoneal nodule in the right iliac fossa is stable.     Cardiac MRI:  Indication:  Cardiomyopathy  Protocol: The patient was scanned on a 1.5 Tesla GE magnet. A  dedicated cardiac coil was used. Functional imaging was done using  Fiesta sequences. 2,3 and 4 chamber views were done to assess  RWMA;s. Quantitative EF was calculated using Circle software on a  dedicated work station. The patient received 20cc of Multihance.  After 10 minutes inversion recovery sequences were done to assess  for infarct or scar  Findings: There was mild LAE. There was moderate LVE. RA and RV  were normal. There was no ASD or VSD. There was a trivial  pericardial effusion. The AV, MV and TV were structurally normal.  The mitral annulus was dilated with MR.  There were no discrete RWMAls or thinning of the myocardium. The  quantitative EF was 27% ( EDV 230 ESV 168 SV 62 )  Delayed enhancement images showed no scar, infarction or  infiltration  Impression  1) Moderate LVE with diffuse hypokinesis EF 27%  2) No hyperenhancement , scar or  infiltration of the LV  myocardium  3) Mild LAE  4) Mitral annular dilatation with MR  5) Trivial pericardial effusion  Charlton Haws MD [facility]     CT HEAD WITHOUT CONTRAST  Technique: Contiguous axial images were obtained from the base of  the skull through the vertex without contrast.  Comparison: Head CT from yesterday.  Findings:  Skull:No acute osseous abnormality. No lytic or blastic lesion.  Orbits: No acute abnormality.  Brain: Decreased prominence of medial left occipital lobe  hemorrhage presumably related to small infarct. Numerous bilateral  small infarcts are difficult to appreciate by CT, excluding a  peripheral left upper cerebellar infarct, which is stable. No new  focus of hemorrhage. Stable ventriculomegaly, likely from central  atrophy. No evidence of acute large territory ischemia.  IMPRESSION:  1. Slightly decreased hemorrhage within the medial left occipital  lobe.  2. No new focus of intracranial hemorrhage.    MRI HEAD WITHOUT CONTRAST  Technique: Multiplanar, multiecho pulse sequences of the brain and  surrounding structures were obtained according to standard protocol  without intravenous contrast.  Comparison: CT 12/05/2012, MRI 12/04/2012  Findings: Multiple widely scattered areas of acute infarction in  both cerebral hemispheres and both cerebellar hemispheres. There  is progression since yesterday. There are numerous small infarcts  in both cerebral hemispheres, with mild progression especially on  the right. There are numerous infarcts in the cerebral  hemispheres which have progressed. There are small infarcts in the  left insula as well as the left frontal and parietal lobe which  have progressed. Left occipital infarct is again noted and shows  evidence of hemorrhage on CT however this is difficult to identify  on MRI. Small areas of acute infarct in the right frontal parietal  lobes have progressed. Right occipital acute infarct is small and   is unchanged.  Gradient echo imaging does not reveal acute hemorrhage however CT  reveals hemorrhage in the left occipital infarct.  Ventricle enlargement is unchanged. This may be due to  communicating hydrocephalus versus atrophy. No fluid collection or  midline shift. Negative for mass.  IMPRESSION:  Numerous small acute infarcts bilaterally with progression since  yesterday. These findings suggest recurrent emboli.  Hemorrhage in the left occipital infarct is best seen on the CT  earlier today.  Ventricular enlargement stable. Possible communicating  hydrocephalus.     Medications: I have reviewed the patient's current medications. She will hold coumadin today and will call this office if  any significant bleeding. She will be seen on Fri 8-22 by coumadin clinic at [facility] with repeat PT INR, and again by MD with labs on Mon 8-25. Note platelets 66K today, so would to to get INR ~ 2-2.5 range. If subtherapeutic INR on 8-22, she will need LMW heparin bridging. Pharmacists to discuss with MD if questions.  Assessment/Plan: 1.acute embolic CVAs in last 2 weeks, with new diagnosis of cardiomyopathy, now on coumadin (supratherapeutic today). Neurology involved and I have told patient and friend that she should contact that office promptly if any change in neurologic status prior to next appointment to Dr Pearlean Brownie. She will keep follow up with Dr Garnette Scheuermann as scheduled; note taxol and carboplatin would not be expected to cause cardiomyopathy (listed <1% for each and not considered risk for this). Hold coumadin today, recheck PT INR (and CBC) on 8-22 with [facility] coumadin clinic to assist then, and see this MD with labs again on Mon 12-16-12. 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 Post first cycle gemzar on 12-02-12, still thrombocytopenic at [facility] today. Not candidate for doxil due to cardiomyopathy, not candidate for avastin with problems now. She will need some central line if continues gemzar. I have told patient and friend that rest of situation needs to be more stable before further chemo and we will continue to address this closely. 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 Not appropriate to remove IVC filter now.  4.intermittent epigastric pain: improved with prn carafate in addition to regular H2 blocker. Note single dose [name] resolved episode of abdominal cramping.  5.long tobacco recently discontinued. CT chest 08-19-12  6.overdue mammograms: no obvious significant concerns on PE  7.ruptured appendix remotely  8.basal cell carcinomas  9. Pneumovax given  10.Advance Directives and HCPOA done, but full support to that point     Time spent with visit 40 min including review of all of information above, discussion >50% of visit and other coordination of care.   Reece Packer, MD   12/12/2012, 5:36 PM

## 2012-12-13 ENCOUNTER — Ambulatory Visit (HOSPITAL_BASED_OUTPATIENT_CLINIC_OR_DEPARTMENT_OTHER): Payer: Medicare Other | Admitting: Pharmacist

## 2012-12-13 ENCOUNTER — Other Ambulatory Visit (HOSPITAL_BASED_OUTPATIENT_CLINIC_OR_DEPARTMENT_OTHER): Payer: Medicare Other | Admitting: Lab

## 2012-12-13 DIAGNOSIS — I6340 Cerebral infarction due to embolism of unspecified cerebral artery: Secondary | ICD-10-CM

## 2012-12-13 DIAGNOSIS — I639 Cerebral infarction, unspecified: Secondary | ICD-10-CM

## 2012-12-13 DIAGNOSIS — I63432 Cerebral infarction due to embolism of left posterior cerebral artery: Secondary | ICD-10-CM

## 2012-12-13 DIAGNOSIS — C569 Malignant neoplasm of unspecified ovary: Secondary | ICD-10-CM

## 2012-12-13 DIAGNOSIS — I6350 Cerebral infarction due to unspecified occlusion or stenosis of unspecified cerebral artery: Secondary | ICD-10-CM

## 2012-12-13 DIAGNOSIS — Z86718 Personal history of other venous thrombosis and embolism: Secondary | ICD-10-CM

## 2012-12-13 LAB — CBC WITH DIFFERENTIAL/PLATELET
BASO%: 0.2 % (ref 0.0–2.0)
LYMPH%: 22.6 % (ref 14.0–49.7)
MCHC: 32.2 g/dL (ref 31.5–36.0)
MONO#: 0.5 10*3/uL (ref 0.1–0.9)
NEUT#: 2.9 10*3/uL (ref 1.5–6.5)
RBC: 3.9 10*6/uL (ref 3.70–5.45)
RDW: 16 % — ABNORMAL HIGH (ref 11.2–14.5)
WBC: 4.6 10*3/uL (ref 3.9–10.3)
lymph#: 1.1 10*3/uL (ref 0.9–3.3)

## 2012-12-13 LAB — PROTIME-INR
INR: 3.6 — ABNORMAL HIGH (ref 2.00–3.50)
Protime: 43.2 Seconds — ABNORMAL HIGH (ref 10.6–13.4)

## 2012-12-13 LAB — POCT INR: INR: 3.6

## 2012-12-13 NOTE — Progress Notes (Signed)
INR = 3.6  Pltc = 63 today HIT panel negative First visit to [facility] Coumadin clinic.  Accompanied by her significant other "Rosanne Ashing" today.   Indication for anticoag: CVA & h/o DVT Duration of anticoag: indefinite Goal INR = 2-3 Doses of Coumadin to date (mostly as IP): 12/06/12 & 12/07/12 - 5 mg; 12/08/12 - 7.5 mg; 12/09/12-12/11/12 - 10 mg  Coumadin held yesterday since her INR = 3.71 (has had 47.5 mg in past week total) Only bleeding has been "tiny" amt from gums when brushing her teeth yesterday only. This is a pleasant lady, I enjoyed our visit. Diet: she enjoys spinach but knows to avoid it in large amts/cooked.  She may have some occasionally in a salad (fresh). EtOH: not a drinker INR remains supratherapeutic.  I have s/w Dr. Darrold Span over phone today & we agree to have pt hold her Coumadin until she has an INR on Monday 8/25 (already scheduled). If her INR is at goal on Monday, I think a decent place to start is 5 mg daily. This is a pleasant lady.  I think we will enjoy her in our clinic.  She is a retired [employer] fund[employer].  Her only neurological deficit resulting from her CVA is her speech.  She has trouble getting her thoughts out clearly sometimes.  She is frustrated by this but thankfully her friend, Rosanne Ashing is very helpful & encouraging to her. Ebony Hail, Pharm.D., CPP 12/13/2012@2 :06 PM

## 2012-12-16 ENCOUNTER — Ambulatory Visit (HOSPITAL_BASED_OUTPATIENT_CLINIC_OR_DEPARTMENT_OTHER): Payer: Medicare Other | Admitting: Pharmacist

## 2012-12-16 ENCOUNTER — Ambulatory Visit: Payer: Medicare Other

## 2012-12-16 ENCOUNTER — Other Ambulatory Visit: Payer: Medicare Other | Admitting: Lab

## 2012-12-16 ENCOUNTER — Other Ambulatory Visit (HOSPITAL_BASED_OUTPATIENT_CLINIC_OR_DEPARTMENT_OTHER): Payer: Medicare Other

## 2012-12-16 ENCOUNTER — Ambulatory Visit: Payer: Medicare Other | Admitting: Physician Assistant

## 2012-12-16 ENCOUNTER — Telehealth: Payer: Self-pay | Admitting: Oncology

## 2012-12-16 ENCOUNTER — Ambulatory Visit: Payer: Medicare Other | Admitting: Oncology

## 2012-12-16 VITALS — BP 120/60 | HR 64 | Temp 98.2°F | Resp 20 | Wt 143.4 lb

## 2012-12-16 DIAGNOSIS — I639 Cerebral infarction, unspecified: Secondary | ICD-10-CM

## 2012-12-16 DIAGNOSIS — I6340 Cerebral infarction due to embolism of unspecified cerebral artery: Secondary | ICD-10-CM

## 2012-12-16 DIAGNOSIS — C569 Malignant neoplasm of unspecified ovary: Secondary | ICD-10-CM

## 2012-12-16 DIAGNOSIS — I63432 Cerebral infarction due to embolism of left posterior cerebral artery: Secondary | ICD-10-CM

## 2012-12-16 DIAGNOSIS — Z86718 Personal history of other venous thrombosis and embolism: Secondary | ICD-10-CM

## 2012-12-16 LAB — CBC WITH DIFFERENTIAL/PLATELET
BASO%: 0.1 % (ref 0.0–2.0)
EOS%: 1.3 % (ref 0.0–7.0)
HCT: 34.8 % (ref 34.8–46.6)
LYMPH%: 9.8 % — ABNORMAL LOW (ref 14.0–49.7)
MCH: 29.6 pg (ref 25.1–34.0)
MCHC: 33 g/dL (ref 31.5–36.0)
MCV: 89.7 fL (ref 79.5–101.0)
MONO%: 10 % (ref 0.0–14.0)
NEUT%: 78.8 % — ABNORMAL HIGH (ref 38.4–76.8)
lymph#: 0.9 10*3/uL (ref 0.9–3.3)

## 2012-12-16 LAB — PROTIME-INR: INR: 1.1 — ABNORMAL LOW (ref 2.00–3.50)

## 2012-12-16 NOTE — Progress Notes (Signed)
INR below goal today due to holding coumadin over the weekend. Pt took her last dose of coumadin on 12/11/12. Pt saw Dr. Darrold Span today. No problems to report regarding anticoagulation. No changes in medications. No changes in diet. Hg = 11.5, HCt = 34.8, Pltc = 84 (improving). HIT panel = negative. Will have pt take coumadin 7.5mg  today. Pt may likely need ~ 5- 6mg  daily. Begin Lovenox 100mg  today since INR subtherapeutci (wt = 65kg, 12/07/12 Scr = 0.6). MD would like pt to have INR checked daily until INR >/= 2. Recheck INR on 12/17/12: lab at 10am and coumadin clinic at 10:15am.  Lovenox injection provided for pt. Coumadin 5mg  tablets provided: (x 2 tablets) Lot: [id number], Exp: 2/16

## 2012-12-16 NOTE — Progress Notes (Signed)
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 She had first gemzar 12-02-12, then acute CVA 12-04-12 (embolic, then some hemorrhagic extension while in hospital); she also has new diagnosis of cardiomyopathy with poor EF. She was on xarelto for IVC/ LE DVT from Feb 2014 until the CVAs; in hospital she was changed to heparin and then put onto coumadin. INR on 12-12-12 was 3.7 and repeat 12-13-12 was 3.6, with platelets still ~ 70k since the recent chemotherapy (HIT negative in hospital). She held coumadin beginning 8-21. INR today is subtherapeutic at 1.1. She has had no bleeding and no new or different neurologic symptoms since she was seen end of last week.   ONCOLOGIC HISTORY Patient developed constipation with some right abdominal pain in May 2013. She saw GI physician in ~ Aug 2013, but was unable to tolerate prep for colonoscopy such that this was never accomplished. Constipation improved with probiotics and Activia. She had abdominal fullness more recently, with early satiety and some reflux symptoms. She developed LLE superficial phlebitis in Jan 2014 (dopplers by PCP negative for DVT then), followed by swelling and discomfort in left medial thigh with venous doppler at [facility] in [facility] on 05-21-12 with acute thrombus left common femoral vein. 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 She was begun on lovenox and transitioned to Xarelto by Dr Everlene Other. CA 125 was 569 initially. She saw Dr Duard Brady on 06-12-12, exam remarkable for distended abdomen with fluid wave and 15 cm fixed mass in pelvis. 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 Neoadjuvant dose dense carbo/taxol was beun 06-21-12 with 3 cycles given thru 08-16-12. She needed neupogen days 2,8 and 16 on this regimen. She had good partial response by CTs 08-19-12 and had improvement in CA 125 from 1041 as baseline for the chemotherapy in late Feb 2014 down to 92 on 08-21-12 after 3 cycles.  She had IVC filter placed prior to gyn oncology surgery. She went to exploratory laparotomy with extensive lysis of adhesions, left ureteral lysis, and bilateral salpingo-oophorectomy by Dr Duard Brady at [facility] on 09-10-12, which was suboptimal debulking. Operative findings: Diffuse carcinomatosis of the residual omentum involving the entire transverse colon. Transverse colon densely adherent to the anterior abdominal wall. No disease throughout the small bowel. Mesenteric adenopathy with lymph nodes measuring 1 cm, involving the root of the small bowel mesentery. ~5 centimeter right ovarian mass and ~ 6 cm left  ovarian mass. Small bowel densely adherent to the pelvis. 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 She resumed chemotherapy with dose dense taxol carboplatin on 09-27-12, completing cycle 5 on 11-08-12. CA 125 increased from 92 in April to 130 post op in June, then 225 late July and repeat 318. CT CAP done 11-19-12 showed some increase in nodular thickening along falciform ligament and small peritoneal nodule right iliac fossa, no ascites, liver ok, no pulmonary emboli, pulmonary nodules or cardiac concerns. Dr Duard Brady recommended change in chemotherapy to doxil with avastin, however echocardiogram 11-27-12 unexpectedly had EF of 15%. She received one cycle of gemzar on 12-02-12, given peripherally.  She developed acute slurring of speech on 12-04-12 and was admitted with acute embolic CVAs from 8-13 thru 12-11-12, treated with heparin and transitioned to coumadin (xarelto [facility]); she had acute worsening of symptoms 12-05-12 with reimaging showing some hemorrhagic transformation left occipital lobe. The initial nonhemorrhagic embolic infarcts were left subinsular, left frontal, left parietal, bilateral occipital and bilateral cerebellar areas. Platelets were low in hospital consistent with gemcitabine given 12-02-12; HIT returned negative. She had further cardiology evaluation in hospital by Dr Verdis Prime.  Note she had no history of cardiac disease other than palpitations prior to this new diagnosis; she had holter for palpitations several years ago. During my care with initial chemotherapy she had some epigastric symptoms that resolved with H2 blocker, but no complaints of DOE/ chest pain/ different palpitations.    Review of systems as above, also: Speech stable, no vision changes and no right face or hand symptoms in past 4 days. No respiratory problems. Some nausea with vomiting after [place] food  last pm and this AM, but those symptoms now resolved and otherwise appetite good, bowels ok. No fever.  Remainder of 10 point Review of Systems negative.  Objective:  Vital signs in last 24 hours:  BP 120/60  Pulse 64  Temp(Src) 98.2 F (36.8 C)  Resp 20  Wt 143 lb 7 oz (65.063 kg)  BMI 22.46 kg/m2  Alert, oriented and appropriate. Ambulatory without assistance.  Speech slurred as when I saw her last  HEENT:PERRL, sclerae not icteric. Oral mucosa moist without lesions, posterior pharynx clear. Neck supple. No JVD.  Lymphatics no cervical,suraclavicular, axillary or inguinal adenopathy Resp: clear to auscultation bilaterally and normal percussion bilaterally Cardio: regular rate and rhythm. No gallop. GI: soft, nontender, not distended, no mass or organomegaly. Surgical incision not remarkable. Extremities: without pitting edema, cords, tenderness Neuro: no facial asymmetry. Motor symmetrical. Sensory intact Skin without rash, ecchymosis, petechiae .No central catheter Lab Results:  Results for orders placed in visit on 12/16/12  CBC WITH DIFFERENTIAL      Result Value Range   WBC 8.7  3.9 - 10.3 10e3/uL   NEUT# 6.8 (*) 1.5 - 6.5 10e3/uL   HGB 11.5 (*) 11.6 - 15.9 g/dL   HCT 40.9  81.1 - 91.4 %   Platelets 84 (*) 145 - 400 10e3/uL   MCV 89.7  79.5 - 101.0 fL   MCH 29.6  25.1 - 34.0 pg   MCHC 33.0  31.5 - 36.0 g/dL   RBC 7.82  9.56 -  5.45 10e6/uL   RDW 15.9 (*) 11.2 - 14.5 %   lymph# 0.9  0.9 - 3.3 10e3/uL   MONO# 0.9  0.1 - 0.9 10e3/uL   Eosinophils Absolute 0.1  0.0 - 0.5 10e3/uL   Basophils Absolute 0.0  0.0 - 0.1 10e3/uL   NEUT% 78.8 (*) 38.4 - 76.8 %   LYMPH% 9.8 (*) 14.0 - 49.7 %   MONO% 10.0  0.0 - 14.0 %   EOS% 1.3  0.0 - 7.0 %   BASO% 0.1  0.0 - 2.0 %   nRBC 0  0 - 0 %  PROTIME-INR      Result Value Range   Protime 13.2  10.6 - 13.4 Seconds   INR 1.10 (*) 2.00 - 3.50   Lovenox No    Note platelets some better today, still <100k    Studies/Results:  No results found.  Medications: I have reviewed the patient's current medications. Anticoagulation discussed with pharmacist and patient/ friend. Will cover with LMW heparin until coumadin therapeutic again. Patient can do own injections, however is in donut hole with insurance now and we will assist as possible with samples. She will take coumadin 7.5 mg today. She will have repeat PT/INR 8-26 and 8-27 with LMW heparin as long as subtherapeutic and daily coumadin dosing for now. We are holding further chemo until acute problem is stabilized.  Assessment/Plan: 1.acute embolic CVAs in last 2 weeks, with new diagnosis of cardiomyopathy, now on coumadin which is subtherapeutic today after supratherapeutic end of last week. Plan as above, appreciate assistance also from [facility] pharmacists in coumadin clinic.  She will keep follow up with Dr Pearlean Brownie of neurology as planned.  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 Post first cycle gemzar on 12-02-12, still thrombocytopenic at 83k today. Not candidate for doxil due to cardiomyopathy, not candidate for avastin with problems now. She will need some central line if continues gemzar. I have told patient and friend that rest of situation needs to be more stable before further chemo and we will continue to address this closely.  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 Not appropriate to remove IVC filter now.  4.cardiomyopathy found unexpectedly on echocardiogram done as baseline for doxil, EF 15-20%. Follow up planned with Dr Katrinka Blazing.  5.long tobacco recently discontinued. CT chest 08-19-12   6.overdue mammograms: no obvious significant concerns on PE and other significant acute problems now 7.ruptured appendix remotely  8.basal cell carcinomas  9. Pneumovax given  10.Advance Directives and HCPOA done, but full support to that point 11.intermittent epigastric pain: improved with prn carafate in addition to regular H2 blocker. Note single dose [name] resolved episode of abdominal cramping.    Patient and friend are comfortable with plan and appreciative of care. I will see her at least 12-25-12 and we will follow INR in interim.    LIVESAY,LENNIS P, MD   12/16/2012, 10:01 AM

## 2012-12-16 NOTE — Patient Instructions (Signed)
Begin Lovenox 100mg  SQ today. Take Coumadin 7.5mg  today. Recheck INR on 12/17/12: Lab at 10am and coumadin clinic at 10:15am. Dr. Darrold Span would like to check your INR daily until INR >/= 2.

## 2012-12-17 ENCOUNTER — Other Ambulatory Visit (HOSPITAL_BASED_OUTPATIENT_CLINIC_OR_DEPARTMENT_OTHER): Payer: Medicare Other | Admitting: Lab

## 2012-12-17 ENCOUNTER — Encounter: Payer: Self-pay | Admitting: Oncology

## 2012-12-17 ENCOUNTER — Ambulatory Visit (HOSPITAL_BASED_OUTPATIENT_CLINIC_OR_DEPARTMENT_OTHER): Payer: Medicare Other | Admitting: Pharmacist

## 2012-12-17 DIAGNOSIS — I639 Cerebral infarction, unspecified: Secondary | ICD-10-CM

## 2012-12-17 DIAGNOSIS — I63432 Cerebral infarction due to embolism of left posterior cerebral artery: Secondary | ICD-10-CM

## 2012-12-17 DIAGNOSIS — I6340 Cerebral infarction due to embolism of unspecified cerebral artery: Secondary | ICD-10-CM

## 2012-12-17 DIAGNOSIS — Z86718 Personal history of other venous thrombosis and embolism: Secondary | ICD-10-CM

## 2012-12-17 DIAGNOSIS — Z7901 Long term (current) use of anticoagulants: Secondary | ICD-10-CM

## 2012-12-17 DIAGNOSIS — I6350 Cerebral infarction due to unspecified occlusion or stenosis of unspecified cerebral artery: Secondary | ICD-10-CM

## 2012-12-17 LAB — PROTIME-INR
INR: 1.3 — ABNORMAL LOW (ref 2.00–3.50)
Protime: 15.6 Seconds — ABNORMAL HIGH (ref 10.6–13.4)

## 2012-12-17 LAB — POCT INR
INR: 1.3
INR: 1.3

## 2012-12-17 NOTE — Progress Notes (Signed)
INR below goal after only 1 dose of coumadin yesterday.  Will probably see the result of yesterday's dose of coumadin tomorrow.  Ms Zook c/o of temp of 100.7 yesterday and she slept all day.  Temp today in coumadin clinic=98 and she feels much better today.  Ms Chicoine will continue to Lovenox 100mg  today and coumadin 7.5mg .  Will check PT/INR tomorrow per MD request (INR daily until >/=2).  It was a pleasure to talk with Ms Osberg and her friend today.  Samples given: Lovenox 100mg  syr x 2 Lot#[id number] Exp: 07/2014 Coumadin 7.5mg  x 10 tabs [id number] Exp 02/2014

## 2012-12-18 ENCOUNTER — Other Ambulatory Visit: Payer: Medicare Other | Admitting: Lab

## 2012-12-18 ENCOUNTER — Ambulatory Visit (HOSPITAL_BASED_OUTPATIENT_CLINIC_OR_DEPARTMENT_OTHER): Payer: Medicare Other | Admitting: Pharmacist

## 2012-12-18 DIAGNOSIS — I639 Cerebral infarction, unspecified: Secondary | ICD-10-CM

## 2012-12-18 DIAGNOSIS — I63432 Cerebral infarction due to embolism of left posterior cerebral artery: Secondary | ICD-10-CM

## 2012-12-18 DIAGNOSIS — I6350 Cerebral infarction due to unspecified occlusion or stenosis of unspecified cerebral artery: Secondary | ICD-10-CM

## 2012-12-18 DIAGNOSIS — Z86718 Personal history of other venous thrombosis and embolism: Secondary | ICD-10-CM

## 2012-12-18 LAB — PROTIME-INR: Protime: 18 Seconds — ABNORMAL HIGH (ref 10.6–13.4)

## 2012-12-18 NOTE — Patient Instructions (Signed)
INR continues to steadily increase. Take Lovenox 100mg  today.  Take Coumadin 7.5mg  today.   Recheck INR on 12/19/12; lab at 11:30am and coumadin clinic at 11:45am.

## 2012-12-18 NOTE — Progress Notes (Signed)
INR remains below goal today but continues to steadily increase after 2 doses of coumadin 7.5mg . No changes in diet or medications to report today. Pt (and her friend Rosanne Ashing) continue to monitor an area of phlebitis on her leg. Will continue Lovenox 100mg  today and Coumadin 7.5mg  today.   Recheck INR on 12/19/12; lab at 11:30am and coumadin clinic at 11:45am. Pt was also scheduled for lab and coumadin clinic on 12/20/12: Lab at 10am and Coumadin clinic at 10:15am.

## 2012-12-19 ENCOUNTER — Other Ambulatory Visit (HOSPITAL_BASED_OUTPATIENT_CLINIC_OR_DEPARTMENT_OTHER): Payer: Medicare Other | Admitting: Lab

## 2012-12-19 ENCOUNTER — Ambulatory Visit (HOSPITAL_BASED_OUTPATIENT_CLINIC_OR_DEPARTMENT_OTHER): Payer: Medicare Other | Admitting: Pharmacist

## 2012-12-19 DIAGNOSIS — I6350 Cerebral infarction due to unspecified occlusion or stenosis of unspecified cerebral artery: Secondary | ICD-10-CM

## 2012-12-19 DIAGNOSIS — Z86718 Personal history of other venous thrombosis and embolism: Secondary | ICD-10-CM

## 2012-12-19 DIAGNOSIS — I639 Cerebral infarction, unspecified: Secondary | ICD-10-CM

## 2012-12-19 DIAGNOSIS — I82403 Acute embolism and thrombosis of unspecified deep veins of lower extremity, bilateral: Secondary | ICD-10-CM

## 2012-12-19 LAB — POCT INR: INR: 2

## 2012-12-19 NOTE — Patient Instructions (Signed)
Stop Lovenox. Take Coumadin 7.5mg  daily. INR continues to increase. Recheck INR on 12/25/12; lab at 12:30pm and coumadin clinic at 12:45pm.

## 2012-12-19 NOTE — Progress Notes (Signed)
Pt seen in clinic today with husband. INR=2.0 on lovenox 100 mg daily and coumadin 7.5 mg started on Monday. We will stop lovenox and continue on coumadin until she sees MD next Wed and recheck INR as well. Gave patient 7.5 mg coumadin samples #10 lot# [id number] exp: 11/15 She is fairly new to our clinic and had a very quick rise in INR with last bridge. She and her husband are very cautious.  They agreed to cancel the appmt tomorrow and wait to check INR next Wed with appmt already scheduled.

## 2012-12-20 ENCOUNTER — Telehealth: Payer: Self-pay | Admitting: Pharmacist

## 2012-12-20 ENCOUNTER — Telehealth: Payer: Self-pay

## 2012-12-20 ENCOUNTER — Other Ambulatory Visit: Payer: Medicare Other | Admitting: Lab

## 2012-12-20 ENCOUNTER — Ambulatory Visit: Payer: Medicare Other

## 2012-12-20 NOTE — Telephone Encounter (Signed)
Jaymes Graff at [phone] to reach Ms. Wellen. Was able to discuss instructions as follows: Per Dr. Darrold Span patient should take 7.5 mg tonight and then decrease dose to 5 mg daily (pt has 10 mg tablets that she can break in half) and recheck INR on 12/24/12 at 11:45 am lab and 12:00 coumadin clinic. Patient voiced understanding and was agreeable to these changes.  Christell Faith, PharmD

## 2012-12-20 NOTE — Telephone Encounter (Signed)
Called patient in response to Dr. Precious Reel message regarding patients coumadin dose. Patient was not available (unsure if gone for the holidays) and did not answer. I left a voicemail with instructions as well as our number [phone]) to call back and discuss changes in plan. On the VM I left her instructions as follows:   Per Dr. Darrold Span patient should take 7.5 mg tonight and then decrease dose to 5 mg daily and recheck INR on 12/24/12 if possible (this may be difficult if unable to reach patient). I instructed her on the VM if she does not have 5 mg tablets to alternate 1 tablet daily with a half tablet every other day until INR check next week. Will continue to try and reach patient by phone

## 2012-12-20 NOTE — Telephone Encounter (Signed)
Gave a copy of note below to [place] to contact the patient regarding coumadin dosing and repeat lab.

## 2012-12-20 NOTE — Telephone Encounter (Signed)
Message copied by Lorine Bears on Fri Dec 20, 2012  3:56 PM ------      Message from: Reece Packer      Created: Fri Dec 20, 2012  3:34 PM       RN please show this message to pharmacist in coumadin clinic:            I am concerned that coumadin dose from 8-28 of 7.5 mg daily may be too high by 12-25-12. Suggest repeat INR today 8-29, OR just decrease dose slightly. I would consider 7.5 mg 8-28 as instructed and 8-29, then 5 mg daily until repeat INR on 9-2 (instead of waiting until 9-3 for next check).      Thanks! ------

## 2012-12-24 ENCOUNTER — Other Ambulatory Visit: Payer: Self-pay | Admitting: *Deleted

## 2012-12-24 ENCOUNTER — Ambulatory Visit (HOSPITAL_BASED_OUTPATIENT_CLINIC_OR_DEPARTMENT_OTHER): Payer: Medicare Other | Admitting: Pharmacist

## 2012-12-24 ENCOUNTER — Other Ambulatory Visit (HOSPITAL_BASED_OUTPATIENT_CLINIC_OR_DEPARTMENT_OTHER): Payer: Medicare Other | Admitting: Lab

## 2012-12-24 VITALS — Temp 98.8°F

## 2012-12-24 DIAGNOSIS — I639 Cerebral infarction, unspecified: Secondary | ICD-10-CM

## 2012-12-24 DIAGNOSIS — C569 Malignant neoplasm of unspecified ovary: Secondary | ICD-10-CM

## 2012-12-24 DIAGNOSIS — Z86718 Personal history of other venous thrombosis and embolism: Secondary | ICD-10-CM

## 2012-12-24 DIAGNOSIS — I63432 Cerebral infarction due to embolism of left posterior cerebral artery: Secondary | ICD-10-CM

## 2012-12-24 LAB — CBC WITH DIFFERENTIAL/PLATELET
BASO%: 0.2 % (ref 0.0–2.0)
LYMPH%: 12.8 % — ABNORMAL LOW (ref 14.0–49.7)
MCHC: 33.2 g/dL (ref 31.5–36.0)
MONO#: 0.9 10*3/uL (ref 0.1–0.9)
Platelets: 192 10*3/uL (ref 145–400)
RBC: 3.61 10*6/uL — ABNORMAL LOW (ref 3.70–5.45)
RDW: 16.9 % — ABNORMAL HIGH (ref 11.2–14.5)
WBC: 7 10*3/uL (ref 3.9–10.3)

## 2012-12-24 LAB — PROTIME-INR
INR: 3.6 — ABNORMAL HIGH (ref 2.00–3.50)
Protime: 43.2 Seconds — ABNORMAL HIGH (ref 10.6–13.4)

## 2012-12-24 LAB — COMPREHENSIVE METABOLIC PANEL (CC13)
ALT: 12 U/L (ref 0–55)
Alkaline Phosphatase: 95 U/L (ref 40–150)
CO2: 24 mEq/L (ref 22–29)
Sodium: 138 mEq/L (ref 136–145)
Total Bilirubin: 0.43 mg/dL (ref 0.20–1.20)
Total Protein: 6.9 g/dL (ref 6.4–8.3)

## 2012-12-24 MED ORDER — CEPHALEXIN 500 MG PO CAPS: 500.0000 mg | ORAL_CAPSULE | Freq: Four times a day (QID) | ORAL | Status: DC

## 2012-12-24 NOTE — Progress Notes (Signed)
INR up to 3.6 today. In the past 1 week, pt has taken total of 45 mg of Coumadin (4 days of 7.5 mg & 3 days of 5 mg).  She has 10 mg Coumadin tabs & 7.5 mg tabs at home. She has L calf phlebitis.  Her calf is red & pt states the veins in her calf are hard.  There is a streak of darker red on her calf that is darker than the surrounding area. She is having trouble walking.  She states she is in a lot of pain but she tries to stay off the leg as much as possible & this helps.  She is nearly always keeping warm, moist compresses on the leg when she has it elevated. She is afebrile (temp = 98.8 oral; taken during clinic visit) I s/w Dr Darrold Span over the phone & we agreed that pt should hold her Coumadin today.  Dr. Darrold Span is RX Keflex for the phlebitis.  This may cause further elevation in the INR. Pt already scheduled to see Dr. Darrold Span tomorrow.  I have added in lab appt so that her INR can be checked tomorrow.  Dr. Darrold Span will give pt instructions on dosing Coumadin tomorrow.  We will need to f/u on dose. A reasonable place to start back tomorrow may be 3.75 mg (1/2 of 7.5 mg tab) daily if her INR is within goal, especially now that Keflex is to begin. Ebony Hail, Pharm.D., CPP 12/24/2012@1 :18 PM

## 2012-12-25 ENCOUNTER — Ambulatory Visit: Payer: Medicare Other

## 2012-12-25 ENCOUNTER — Ambulatory Visit (HOSPITAL_BASED_OUTPATIENT_CLINIC_OR_DEPARTMENT_OTHER): Payer: Medicare Other | Admitting: Oncology

## 2012-12-25 ENCOUNTER — Other Ambulatory Visit: Payer: Medicare Other | Admitting: Lab

## 2012-12-25 ENCOUNTER — Encounter: Payer: Self-pay | Admitting: Pharmacist

## 2012-12-25 ENCOUNTER — Other Ambulatory Visit (HOSPITAL_BASED_OUTPATIENT_CLINIC_OR_DEPARTMENT_OTHER): Payer: Medicare Other | Admitting: Lab

## 2012-12-25 ENCOUNTER — Ambulatory Visit: Payer: Medicare Other | Admitting: Oncology

## 2012-12-25 ENCOUNTER — Telehealth: Payer: Self-pay | Admitting: Oncology

## 2012-12-25 VITALS — BP 121/70 | HR 86 | Temp 98.3°F | Resp 20 | Ht 67.0 in | Wt 143.9 lb

## 2012-12-25 DIAGNOSIS — I82403 Acute embolism and thrombosis of unspecified deep veins of lower extremity, bilateral: Secondary | ICD-10-CM

## 2012-12-25 DIAGNOSIS — C569 Malignant neoplasm of unspecified ovary: Secondary | ICD-10-CM

## 2012-12-25 DIAGNOSIS — I82409 Acute embolism and thrombosis of unspecified deep veins of unspecified lower extremity: Secondary | ICD-10-CM

## 2012-12-25 LAB — CA 125: CA 125: 1958.6 U/mL — ABNORMAL HIGH (ref 0.0–30.2)

## 2012-12-25 LAB — PROTIME-INR: INR: 2.5 (ref 2.00–3.50)

## 2012-12-25 NOTE — Progress Notes (Signed)
I provided pt w/ 30 syringes of Lovenox 100 mg from Coumadin clinic supply.  Pt may call as she nears the time she will need more to see if we have additional surplus to provide her w/ more.  Her RX was sent to [facility] & placed on Hold until needed.  Pt is paying out of pocket $543 for 1 month supply. Ebony Hail, Pharm.D., CPP 12/25/2012@2 :39 PM

## 2012-12-25 NOTE — Telephone Encounter (Signed)
gv pt appt schedule for September including PICC line placement for 9/4 -pt aware she is to arrive @ 3pm. Per desk nurse added DRG CHG 9/5 and PICC flush appts MWF starting 9/8 until pt sees LL again 9/15.

## 2012-12-25 NOTE — Progress Notes (Signed)
OFFICE PROGRESS NOTE   12/25/2012   Physicians: P.Gehrig, Aura Dials, MD (PCP [facility])/ [place] Elpidio Anis, Leanord Hawking   INTERVAL HISTORY:  Patient is seen, together with significant other Rosanne Ashing, in continuing attention to progressive ovarian cancer, recent complication of embolic CVAs and new superficial thrombophlebitis LLE despite therapeutic coumadin. Patient had progressive ovarian cancer during taxol carboplatin treatment after interval debulking, then was found unexpectedly to have significant cardiomyopathy such that doxil could not be used. 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  Coumadin has been closely monitored and therapeutic for past week. During the past week she has had new superficial thrombophlebitis left calf, similar to problems she had just prior to diagnosis of the ovarian cancer. She began Keflex on 12-24-12 per this MD discussion with [facility] pharmacist, with possible very minimal improvement in the area now after 4 doses; she has been using warm compresses and keeping leg elevated. She has not been febrile.  Speech is a little better, no new neurologic symptoms, no palpitations x several days, no chest pain or other cardiac symptoms. Appetite is good, no abdominal or pelvic discomfort, bowels moving. No bleeding. She needs central line for further gemzar.   ONCOLOGIC HISTORY Patient developed constipation with some right abdominal pain in May 2013. She saw GI physician in ~ Aug 2013, but was unable to tolerate prep for colonoscopy such that this was never accomplished. Constipation improved with probiotics and Activia. She had abdominal fullness more recently, with early satiety and some reflux symptoms. She developed LLE superficial phlebitis in Jan 2014 (dopplers by PCP negative for DVT then), followed by swelling and discomfort in left medial thigh with venous doppler at [facility] in  [facility] on 05-21-12 with acute thrombus left common femoral vein. 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 She was begun on lovenox and transitioned to Xarelto by Dr Everlene Other. CA 125 was 569 initially. She saw Dr Duard Brady on 06-12-12, exam remarkable for distended abdomen with fluid wave and 15 cm fixed mass in pelvis. 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 Neoadjuvant dose dense carbo/taxol was beun 06-21-12 with 3 cycles given thru 08-16-12. She needed neupogen days 2,8 and 16 on this regimen. She had good partial response by CTs 08-19-12 and had improvement in CA 125 from 1041 as baseline for the chemotherapy in late Feb 2014 down to 92 on 08-21-12 after 3 cycles. She had IVC filter placed prior to gyn oncology surgery. She went to exploratory laparotomy with extensive lysis of adhesions, left ureteral lysis, and bilateral salpingo-oophorectomy by Dr Duard Brady at [facility] on 09-10-12, which was suboptimal debulking. Operative findings:  Diffuse carcinomatosis of the residual omentum involving the entire transverse colon. Transverse colon densely adherent to the anterior abdominal wall. No disease throughout the small bowel.  Mesenteric adenopathy with lymph nodes measuring 1 cm, involving the root of the small bowel mesentery. ~5 centimeter right ovarian mass and ~ 6 cm left ovarian mass. Small bowel densely adherent to the pelvis. 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 She resumed chemotherapy with dose dense taxol carboplatin on 09-27-12, completing cycle 5 on 11-08-12. CA 125 increased from 92 in April to 130 post op in June, then 225 late July and repeat 318. CT CAP done 11-19-12 showed some increase in nodular thickening along falciform ligament and small peritoneal nodule right iliac fossa, no ascites, liver ok, no pulmonary emboli, pulmonary nodules or cardiac concerns. Dr Duard Brady recommended change in chemotherapy to doxil with avastin, however echocardiogram 11-27-12 unexpectedly had EF of 15%. She received one cycle of gemzar on 12-02-12, given peripherally.  She developed acute slurring of speech on 12-04-12 and was admitted with acute embolic CVAs from 8-13 thru 12-11-12, treated with heparin and transitioned to coumadin (xarelto [facility]); she had acute worsening of symptoms 12-05-12 with reimaging showing some hemorrhagic transformation left occipital lobe. The initial nonhemorrhagic embolic infarcts were left subinsular, left frontal, left parietal, bilateral occipital and bilateral cerebellar areas. Platelets were low in hospital consistent with gemcitabine given 12-02-12; HIT returned negative. She had further cardiology evaluation in hospital by Dr Verdis Prime.  Note she had no history of cardiac disease other than palpitations prior to this new diagnosis;  she had holter for palpitations several years ago. During my care with initial chemotherapy she had some epigastric symptoms that resolved with H2 blocker, but no complaints of DOE/ chest pain/ different palpitations.    Review of systems as above, also: Tmax 99, no shaking chills, no other symptoms of infection. Did sleep last pm,  LLE uncomfortable with ambulating. Appetite good. Tolerating Keflex without difficulty. Remainder of 10 point Review of Systems negative.  Objective:  Vital signs in last 24 hours:  BP 121/70  Pulse 86  Temp(Src) 98.3 F (36.8 C) (Oral)  Resp 20  Ht 5\' 7"  (1.702 m)  Wt 143 lb 14.4 oz (65.273 kg)  BMI 22.53 kg/m2  Alert, oriented and appropriate. Ambulatory without assistance.  Alopecia. Speech better today.  HEENT:PERRL, sclerae not icteric. Oral mucosa moist without lesions, posterior pharynx clear. Neck supple. No JVD.  Lymphatics no cervical,suraclavicular, axillary or inguinal adenopathy Resp: clear to auscultation bilaterally and normal percussion bilaterally Cardio: regular rate and rhythm. No gallop. GI: soft, nontender, not distended, no mass or organomegaly. Surgical incision not remarkable. Extremities: no pitting edema or deep calf tenderness. Area of discomfort at superficial varicosity right calf from ~ 2 weeks ago resolved. Marked erythema with tenderness and superficial cords in 3 areas of left posterior calf, largest extending ~ 6 cm. Easy ROM ankle and knee. Neuro: speech more fluent, less expressive aphasia. No facial asymmetry. No focal motor deficits. Skin without rash, ecchymosis, petechiae. Improved area of discoloration right forearm along vein used for first peripheral gemzar.   Lab Results:  Results for orders placed in visit  on 12/25/12  PROTIME-INR      Result Value Range   Protime 30.0 (*) 10.6 - 13.4 Seconds   INR 2.50  2.00 - 3.50   Lovenox No       Studies/Results:  No results found. We decided against repeating  LE venous doppler today as most appropriate to change coumadin to LMW heparin regardless of that result.  Medications: I have reviewed the patient's current medications.  We have discussed situation with new superficial thrombophlebitis despite therapeutic coumadin for past week. She agrees to changing anticoagulation to LMW heparin, which she will need indefinitely.RN, pharmacist and financial staff have assisted now, with 30 days of lovenox supplied as she is in donut hole with [employer] (lovenox less expensive than arixtra).  She will complete week's course of Keflex.  We have discussed resuming chemotherapy with gemzar. Per IR, she does not need to hold lovenox for PICC placement. She is agreeable to PICC flushes at 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 She and Rosanne Ashing understand that this gyn cancer is very aggressive and that chemotherapy options are limited. Fortunately she still seems asymptomatic from standpoint of the progressive cancer.  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 Complete Keflex (begun 12-24-12).  Has IVC filter in. 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 Post first cycle gemzar on 12-02-12, counts recovered now. Not candidate for doxil due to cardiomyopathy, not candidate for avastin with problems now. Will set up PICC and resume gemzar. Note further rise in CA 125, to 1958 on 12-24-12. 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 Follow up with Dr Katrinka Blazing.   4.long tobacco recently discontinued.  5.overdue mammograms: no obvious significant concerns on PE and other significant acute problems now  6.Advance Directives and HCPOA done, but full support to that point  7.intermittent epigastric pain previously: improved with prn carafate in addition to regular H2 blocker. Note single dose [name] resolved episode of abdominal cramping.     All questions answered to their satisfaction. Scheduling in process. Chemotherapy orders reviewed and updated. Time spent including complex coordination of care 45 min.   LIVESAY,LENNIS P, MD   12/25/2012, 3:30 PM

## 2012-12-25 NOTE — Patient Instructions (Signed)
Finish Keflex antibiotic as instructed. Stop coumadin. Start LMW heparin shots daily.  For PICC line placement, you do not need to interrupt the LMW heparin shots at all

## 2012-12-26 ENCOUNTER — Ambulatory Visit (HOSPITAL_COMMUNITY)
Admission: RE | Admit: 2012-12-26 | Discharge: 2012-12-26 | Disposition: A | Discharge status: Home / Self Care | Payer: Medicare Other | Source: Ambulatory Visit | Attending: Oncology | Admitting: Oncology

## 2012-12-26 ENCOUNTER — Encounter: Payer: Self-pay | Admitting: Oncology

## 2012-12-26 ENCOUNTER — Other Ambulatory Visit: Payer: Self-pay | Admitting: Oncology

## 2012-12-26 ENCOUNTER — Other Ambulatory Visit: Payer: Self-pay

## 2012-12-26 DIAGNOSIS — C569 Malignant neoplasm of unspecified ovary: Secondary | ICD-10-CM | POA: Insufficient documentation

## 2012-12-26 DIAGNOSIS — Z86718 Personal history of other venous thrombosis and embolism: Secondary | ICD-10-CM

## 2012-12-26 MED ORDER — ENOXAPARIN SODIUM 100 MG/ML ~~LOC~~ SOLN: 100.0000 mg | Freq: Every day | SUBCUTANEOUS | Status: DC

## 2012-12-26 MED ORDER — LIDOCAINE HCL 1 % IJ SOLN
INTRAMUSCULAR | Status: AC
  Filled 2012-12-26: qty 20

## 2012-12-26 NOTE — Procedures (Signed)
Placement of right arm PICC.  Length = 40 cm.  Tip at cavoatrial junction.

## 2012-12-27 ENCOUNTER — Ambulatory Visit: Payer: Medicare Other | Admitting: Oncology

## 2012-12-27 ENCOUNTER — Ambulatory Visit (HOSPITAL_BASED_OUTPATIENT_CLINIC_OR_DEPARTMENT_OTHER): Payer: Medicare Other

## 2012-12-27 VITALS — BP 117/51 | HR 88 | Temp 98.0°F | Resp 20

## 2012-12-27 DIAGNOSIS — Z452 Encounter for adjustment and management of vascular access device: Secondary | ICD-10-CM

## 2012-12-27 DIAGNOSIS — C569 Malignant neoplasm of unspecified ovary: Secondary | ICD-10-CM

## 2012-12-27 MED ORDER — SODIUM CHLORIDE 0.9 % IJ SOLN
10.0000 mL | INTRAMUSCULAR | Status: DC | PRN
  Administered 2012-12-27: 10 mL via INTRAVENOUS
  Filled 2012-12-27: qty 10

## 2012-12-27 MED ORDER — HEPARIN SOD (PORK) LOCK FLUSH 100 UNIT/ML IV SOLN
500.0000 [IU] | Freq: Once | INTRAVENOUS | Status: AC
  Administered 2012-12-27: 250 [IU] via INTRAVENOUS
  Filled 2012-12-27: qty 5

## 2012-12-30 ENCOUNTER — Ambulatory Visit (HOSPITAL_BASED_OUTPATIENT_CLINIC_OR_DEPARTMENT_OTHER): Payer: Medicare Other

## 2012-12-30 ENCOUNTER — Other Ambulatory Visit (HOSPITAL_BASED_OUTPATIENT_CLINIC_OR_DEPARTMENT_OTHER): Payer: Medicare Other | Admitting: Lab

## 2012-12-30 VITALS — BP 103/73 | HR 72 | Temp 98.6°F | Resp 20

## 2012-12-30 DIAGNOSIS — C569 Malignant neoplasm of unspecified ovary: Secondary | ICD-10-CM

## 2012-12-30 DIAGNOSIS — Z5111 Encounter for antineoplastic chemotherapy: Secondary | ICD-10-CM

## 2012-12-30 LAB — CBC WITH DIFFERENTIAL/PLATELET
Basophils Absolute: 0 10*3/uL (ref 0.0–0.1)
Eosinophils Absolute: 0.1 10*3/uL (ref 0.0–0.5)
HGB: 10.8 g/dL — ABNORMAL LOW (ref 11.6–15.9)
MCV: 88.3 fL (ref 79.5–101.0)
MONO#: 0.7 10*3/uL (ref 0.1–0.9)
NEUT#: 5.7 10*3/uL (ref 1.5–6.5)
RDW: 15.7 % — ABNORMAL HIGH (ref 11.2–14.5)
WBC: 7.7 10*3/uL (ref 3.9–10.3)
lymph#: 1.1 10*3/uL (ref 0.9–3.3)

## 2012-12-30 MED ORDER — HEPARIN SOD (PORK) LOCK FLUSH 100 UNIT/ML IV SOLN
250.0000 [IU] | Freq: Once | INTRAVENOUS | Status: AC | PRN
  Administered 2012-12-30: 250 [IU]
  Filled 2012-12-30: qty 5

## 2012-12-30 MED ORDER — SODIUM CHLORIDE 0.9 % IV SOLN
Freq: Once | INTRAVENOUS | Status: AC
  Administered 2012-12-30: 14:00:00 via INTRAVENOUS

## 2012-12-30 MED ORDER — SODIUM CHLORIDE 0.9 % IV SOLN
800.0000 mg/m2 | Freq: Once | INTRAVENOUS | Status: AC
  Administered 2012-12-30: 1444 mg via INTRAVENOUS
  Filled 2012-12-30: qty 37.98

## 2012-12-30 MED ORDER — DEXAMETHASONE SODIUM PHOSPHATE 10 MG/ML IJ SOLN
4.0000 mg | Freq: Once | INTRAMUSCULAR | Status: AC
  Administered 2012-12-30: 4 mg via INTRAVENOUS

## 2012-12-30 MED ORDER — ONDANSETRON 8 MG/50ML IVPB (CHCC)
8.0000 mg | Freq: Once | INTRAVENOUS | Status: AC
  Administered 2012-12-30: 8 mg via INTRAVENOUS

## 2012-12-30 MED ORDER — DEXAMETHASONE SODIUM PHOSPHATE 10 MG/ML IJ SOLN
INTRAMUSCULAR | Status: AC
  Administered 2012-12-30: 4 mg via INTRAVENOUS
  Filled 2012-12-30: qty 1

## 2012-12-30 MED ORDER — ACETAMINOPHEN 325 MG PO TABS
ORAL_TABLET | ORAL | Status: AC
  Administered 2012-12-30: 650 mg via ORAL
  Filled 2012-12-30: qty 2

## 2012-12-30 MED ORDER — ONDANSETRON 8 MG/NS 50 ML IVPB
INTRAVENOUS | Status: AC
  Filled 2012-12-30: qty 8

## 2012-12-30 MED ORDER — ACETAMINOPHEN 325 MG PO TABS
650.0000 mg | ORAL_TABLET | Freq: Once | ORAL | Status: AC
  Administered 2012-12-30: 650 mg via ORAL

## 2012-12-30 MED ORDER — SODIUM CHLORIDE 0.9 % IJ SOLN
3.0000 mL | INTRAMUSCULAR | Status: DC | PRN
  Administered 2012-12-30: 3 mL via INTRAVENOUS
  Filled 2012-12-30: qty 10

## 2012-12-30 NOTE — Patient Instructions (Addendum)
[facility] Discharge Instructions for Patients Receiving Chemotherapy  Today you received the following chemotherapy agents Gemzar.  To help prevent nausea and vomiting after your treatment, we encourage you to take your nausea medication as prescribed.   If you develop nausea and vomiting that is not controlled by your nausea medication, call the clinic.   BELOW ARE SYMPTOMS THAT SHOULD BE REPORTED IMMEDIATELY:  *FEVER GREATER THAN 100.5 F  *CHILLS WITH OR WITHOUT FEVER  NAUSEA AND VOMITING THAT IS NOT CONTROLLED WITH YOUR NAUSEA MEDICATION  *UNUSUAL SHORTNESS OF BREATH  *UNUSUAL BRUISING OR BLEEDING  TENDERNESS IN MOUTH AND THROAT WITH OR WITHOUT PRESENCE OF ULCERS  *URINARY PROBLEMS  *BOWEL PROBLEMS  UNUSUAL RASH Items with * indicate a potential emergency and should be followed up as soon as possible.  Feel free to call the clinic you have any questions or concerns. The clinic phone number is [phone].    

## 2012-12-31 ENCOUNTER — Telehealth: Payer: Self-pay | Admitting: *Deleted

## 2012-12-31 ENCOUNTER — Telehealth: Payer: Self-pay | Admitting: Oncology

## 2012-12-31 NOTE — Telephone Encounter (Signed)
Fax  medical records to [facility] office from Dr. Darrold Span

## 2012-12-31 NOTE — Telephone Encounter (Signed)
Message copied by Augusto Garbe on Tue Dec 31, 2012 12:57 PM ------      Message from: Lenn Sink I      Created: Mon Dec 30, 2012  3:56 PM      Regarding: follow up call       First time Gemzar. No reaction. Dr. Darrold Span. Call late morning or early afternoon.  ------

## 2012-12-31 NOTE — Telephone Encounter (Signed)
Called Monique [name] at [employer] [phone], W [phone]  number(s).  Mobile number [phone] does not have voicemail set up yet.  Messge left requesting a return call for chemotherapy follow up.  Awaiting return call from patient.

## 2013-01-01 ENCOUNTER — Ambulatory Visit (HOSPITAL_BASED_OUTPATIENT_CLINIC_OR_DEPARTMENT_OTHER): Payer: Medicare Other

## 2013-01-01 VITALS — BP 127/67 | HR 100 | Temp 98.4°F | Resp 20

## 2013-01-01 DIAGNOSIS — Z8543 Personal history of malignant neoplasm of ovary: Secondary | ICD-10-CM

## 2013-01-01 MED ORDER — SODIUM CHLORIDE 0.9 % IJ SOLN
3.0000 mL | Freq: Once | INTRAMUSCULAR | Status: AC
  Administered 2013-01-01: 12:00:00 via INTRAVENOUS
  Filled 2013-01-01: qty 10

## 2013-01-01 MED ORDER — HEPARIN SOD (PORK) LOCK FLUSH 100 UNIT/ML IV SOLN
250.0000 [IU] | Freq: Once | INTRAVENOUS | Status: AC
  Administered 2013-01-01: 250 [IU] via INTRAVENOUS
  Filled 2013-01-01: qty 5

## 2013-01-01 MED ORDER — SODIUM CHLORIDE 0.9 % IV SOLN: Freq: Once | INTRAVENOUS | Status: DC

## 2013-01-01 NOTE — Patient Instructions (Addendum)

## 2013-01-03 ENCOUNTER — Ambulatory Visit (HOSPITAL_BASED_OUTPATIENT_CLINIC_OR_DEPARTMENT_OTHER): Payer: Medicare Other

## 2013-01-03 ENCOUNTER — Telehealth: Payer: Self-pay | Admitting: *Deleted

## 2013-01-03 VITALS — BP 107/66 | HR 101 | Temp 98.4°F

## 2013-01-03 DIAGNOSIS — C569 Malignant neoplasm of unspecified ovary: Secondary | ICD-10-CM

## 2013-01-03 MED ORDER — HEPARIN SOD (PORK) LOCK FLUSH 100 UNIT/ML IV SOLN
500.0000 [IU] | Freq: Once | INTRAVENOUS | Status: AC
  Administered 2013-01-03: 500 [IU] via INTRAVENOUS
  Filled 2013-01-03: qty 5

## 2013-01-03 MED ORDER — SODIUM CHLORIDE 0.9 % IJ SOLN
10.0000 mL | INTRAMUSCULAR | Status: DC | PRN
  Administered 2013-01-03: 10 mL via INTRAVENOUS
  Filled 2013-01-03: qty 10

## 2013-01-03 NOTE — Telephone Encounter (Signed)
Patient's husband called and left message regarding appt for today. They want to move appt to earlier in the day. I have called and left them a message stating that wee have earlier appt and to call me to move appt.  JMW

## 2013-01-05 ENCOUNTER — Other Ambulatory Visit: Payer: Self-pay | Admitting: Oncology

## 2013-01-06 ENCOUNTER — Other Ambulatory Visit (HOSPITAL_BASED_OUTPATIENT_CLINIC_OR_DEPARTMENT_OTHER): Payer: Medicare Other | Admitting: Lab

## 2013-01-06 ENCOUNTER — Ambulatory Visit (HOSPITAL_BASED_OUTPATIENT_CLINIC_OR_DEPARTMENT_OTHER): Payer: Medicare Other

## 2013-01-06 ENCOUNTER — Other Ambulatory Visit: Payer: Self-pay

## 2013-01-06 ENCOUNTER — Ambulatory Visit (HOSPITAL_BASED_OUTPATIENT_CLINIC_OR_DEPARTMENT_OTHER): Payer: Medicare Other | Admitting: Oncology

## 2013-01-06 ENCOUNTER — Telehealth: Payer: Self-pay | Admitting: Oncology

## 2013-01-06 ENCOUNTER — Telehealth: Payer: Self-pay | Admitting: *Deleted

## 2013-01-06 VITALS — BP 116/71 | HR 83 | Temp 99.0°F | Resp 20 | Ht 67.0 in | Wt 137.4 lb

## 2013-01-06 DIAGNOSIS — Z86718 Personal history of other venous thrombosis and embolism: Secondary | ICD-10-CM

## 2013-01-06 DIAGNOSIS — C569 Malignant neoplasm of unspecified ovary: Secondary | ICD-10-CM

## 2013-01-06 LAB — CBC WITH DIFFERENTIAL/PLATELET
Eosinophils Absolute: 0 10*3/uL (ref 0.0–0.5)
HCT: 30.3 % — ABNORMAL LOW (ref 34.8–46.6)
HGB: 9.9 g/dL — ABNORMAL LOW (ref 11.6–15.9)
LYMPH%: 18.7 % (ref 14.0–49.7)
MONO#: 0.5 10*3/uL (ref 0.1–0.9)
NEUT#: 4.3 10*3/uL (ref 1.5–6.5)
NEUT%: 71.7 % (ref 38.4–76.8)
Platelets: 70 10*3/uL — ABNORMAL LOW (ref 145–400)
WBC: 6 10*3/uL (ref 3.9–10.3)
lymph#: 1.1 10*3/uL (ref 0.9–3.3)

## 2013-01-06 MED ORDER — SODIUM CHLORIDE 0.9 % IJ SOLN
10.0000 mL | Freq: Once | INTRAMUSCULAR | Status: AC
  Administered 2013-01-06: 10 mL via INTRAVENOUS
  Filled 2013-01-06: qty 10

## 2013-01-06 MED ORDER — ENOXAPARIN SODIUM 100 MG/ML ~~LOC~~ SOLN: 100.0000 mg | Freq: Every day | SUBCUTANEOUS | Status: DC

## 2013-01-06 NOTE — Patient Instructions (Addendum)
No lovenox from now until we recheck platelet count on 9-19. Call if any significant bleeding    [phone]

## 2013-01-06 NOTE — Telephone Encounter (Signed)
, °

## 2013-01-06 NOTE — Telephone Encounter (Signed)
Prescription today sent to [facility] for pricing comparison to [facility] pharmacy

## 2013-01-06 NOTE — Progress Notes (Signed)
OFFICE PROGRESS NOTE   01/06/2013   Physicians: P.Gehrig, Aura Dials, MD (PCP [facility])/ [place] Elpidio Anis, Leanord Hawking   INTERVAL HISTORY:  Patient is seen, together with friend Rosanne Ashing, in continuing attention to progressive ovarian cancer for which she had cycle 2 gemzar on 12-30-12, recent course complicated by recent embolic CVAs while on xarelto and progressive superficial thrombophlebitis while on coumadin, now on lovenox. She does have IVC filter in. Platelets are low today from chemotherapy; she has already taken lovenox dose today. She otherwise tolerated #2 gemzar well, with no nausea, no fever, some puritic rash anterior chest beginning shortly after the treatment and improved now. The superficial thrombophlebitis LE bilaterally is much improved with Keflex (completed) and lovenox, no longer any tenderness on standing. No bleeding. PICC was placed by IR on 12-26-12. This is power PICC, which requires flush once weekly with saline (per our discussion with multiple nursing staff now, as the flush protocol for these lines is still in process).   ONCOLOGIC HISTORY Patient developed constipation with some right abdominal pain in May 2013. She saw GI physician in ~ Aug 2013, but was unable to tolerate prep for colonoscopy such that this was never accomplished. Constipation improved with probiotics and Activia. She had abdominal fullness more recently, with early satiety and some reflux symptoms. She developed LLE superficial phlebitis in Jan 2014 (dopplers by PCP negative for DVT then), followed by swelling and discomfort in left medial thigh with venous doppler at [facility] in [facility] on 05-21-12 with acute thrombus left common femoral vein. 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 She was begun on lovenox and transitioned to Xarelto by Dr Everlene Other. CA 125 was 569 initially. She saw Dr Duard Brady on 06-12-12, exam remarkable for distended abdomen with fluid wave and 15 cm fixed mass in pelvis. 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 Neoadjuvant dose dense carbo/taxol was beun 06-21-12 with 3 cycles given thru 08-16-12. She needed neupogen days 2,8 and 16 on this regimen. She had good partial response by CTs 08-19-12 and had improvement in CA 125 from 1041 as baseline for the chemotherapy in late Feb 2014 down to 92 on 08-21-12 after 3 cycles. She had IVC filter placed prior to gyn oncology surgery. She went to exploratory laparotomy with extensive lysis of adhesions, left ureteral lysis, and bilateral salpingo-oophorectomy by Dr Duard Brady at [facility] on 09-10-12, which was suboptimal debulking. Operative findings: Diffuse carcinomatosis of the residual omentum involving the entire transverse colon. Transverse colon densely adherent to the anterior abdominal wall. No disease throughout the small bowel. Mesenteric adenopathy with lymph nodes measuring 1 cm, involving the root of the small bowel  mesentery. ~5 centimeter right ovarian mass and ~ 6 cm left ovarian mass. Small bowel densely adherent to the pelvis. 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 She resumed chemotherapy with dose dense taxol carboplatin on 09-27-12, completing cycle 5 on 11-08-12. CA 125 increased from 92 in April to 130 post op in June, then 225 late July and repeat 318. CT CAP done 11-19-12 showed some increase in nodular thickening along falciform ligament and small peritoneal nodule right iliac fossa, no ascites, liver ok, no pulmonary emboli, pulmonary nodules or cardiac concerns. Dr Duard Brady recommended change in chemotherapy to doxil with avastin, however echocardiogram 11-27-12 unexpectedly had EF of 15%. She received one cycle of gemzar on 12-02-12, given peripherally. She was stable to resume gemzar (800 mg/m2) with cycle 2 on 12-30-12,then platelets down to 70k by day 8.  She developed acute slurring of speech on 12-04-12 and was admitted with acute embolic CVAs from 8-13 thru 12-11-12, treated with heparin and transitioned to coumadin (xarelto [facility]); she had acute worsening of symptoms 12-05-12 with reimaging showing some hemorrhagic transformation left occipital lobe. The initial nonhemorrhagic embolic infarcts were left subinsular, left frontal, left parietal, bilateral occipital and bilateral cerebellar areas. Platelets were low in hospital consistent with gemcitabine given 12-02-12; HIT returned negative. She had further cardiology evaluation in hospital by Dr Verdis Prime.  Note she had no history of cardiac disease other than palpitations prior to this new diagnosis; she had holter for palpitations several years ago.    Review of systems as above, also: PO intake good. Bowels loose x2 with Keflex, have not moved past ! 2 days since that. Right  hand possible a little more difficult to use since PICC on right, as with signature, tho that seems improving; has trigger finger on right also.No fever or symptoms of infection. No respiratory symptoms. No chest pain, palpitations, other cardiac symptoms. PICC not uncomfortable. Is able to sleep Remainder of 10 point Review of Systems negative.  Objective:  Vital signs in last 24 hours:  BP 116/71  Pulse 83  Temp(Src) 99 F (37.2 C) (Oral)  Resp 20  Ht 5\' 7"  (1.702 m)  Wt 137 lb 6.4 oz (62.324 kg)  BMI 21.51 kg/m2  Alert, oriented and appropriate. Speech fairly fluent, some expressive difficulty. Ambulatory without assistance and looks remarkably good overall.  Alopecia. George Ina very helpful.  HEENT:PERRL, sclerae not icteric. Oral mucosa moist without lesions, posterior pharynx clear. Neck supple. No JVD. Marland Kitchen Lymphatics no cervical,suraclavicular adenopathy Resp: clear to auscultation bilaterally and normal percussion bilaterally Cardio: regular rate and rhythm. No gallop. GI: soft, nontender, not distended, no mass or organomegaly. Surgical incision not remarkable. Musculoskeletal/ Extremities: without pitting edema, cords, tenderness. Extensive superficial varicosities bilateral LE, no longer heat or tender cords in either calf. Neuro:speech as noted. Grip 2+ right hand, 3+ left hand. No facial droop. Skin scattered minimal erythematous rash anterior chest, no vesicles, no apparent suprainfection, looks consistent with gemzar rash .PICC above right antecubital with dressings intact, no swelling or tenderness.   Lab Results:  Results for orders placed in visit on 01/06/13  CBC WITH DIFFERENTIAL      Result Value  Range   WBC 6.0  3.9 - 10.3 10e3/uL   NEUT# 4.3  1.5 - 6.5 10e3/uL   HGB 9.9 (*) 11.6 - 15.9 g/dL   HCT 16.1 (*) 09.6 - 04.5 %   Platelets 70 (*) 145 - 400 10e3/uL   MCV 88.3  79.5 - 101.0 fL   MCH 28.9  25.1 - 34.0 pg   MCHC 32.7  31.5 - 36.0 g/dL   RBC 4.09  (*) 8.11 - 5.45 10e6/uL   RDW 16.5 (*) 11.2 - 14.5 %   lymph# 1.1  0.9 - 3.3 10e3/uL   MONO# 0.5  0.1 - 0.9 10e3/uL   Eosinophils Absolute 0.0  0.0 - 0.5 10e3/uL   Basophils Absolute 0.0  0.0 - 0.1 10e3/uL   NEUT% 71.7  38.4 - 76.8 %   LYMPH% 18.7  14.0 - 49.7 %   MONO% 8.9  0.0 - 14.0 %   EOS% 0.4  0.0 - 7.0 %   BASO% 0.3  0.0 - 2.0 %     Studies/Results:  No results found.  Medications: I have reviewed the patient's current medications. She will hold lovenox beginning 9-16 (as she has already taken dose 9-15) until CBC repeated on 9-19; expect to resume lovenox then if plt >~ 90k if otherwise ok. Can use benadryl prn gemzar rash.  We have discussed using gemzar every other week if counts allow as doses adjusted. Treatment plan orders changed to gemzar 600 mg/m2 every 14 days.  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 Post first cycle gemzar on 12-02-12, cycle 2 gemzar by PICC given 12-30-12.  CA 125 1958 on 12-24-12, will follow. Not clearly symptomatic from the recurrent ovarian cancer. Anemia from chemo not obviously symptomatic, thrombocytopenia as above. WIll follow counts and decrease gemzar dose.  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  Has IVC filter in. To see Dr Pearlean Brownie 02-24-13..  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 Follow up with Dr Katrinka Blazing.  4.long tobacco recently discontinued.  5.overdue mammograms:  other significant acute problems now, but  we will remain aware of this if situation appropriate 6.Advance Directives and HCPOA done, but full support to that point  7.power PICC in, to be flushed with saline once weekly per most knowledgeable nursing manager and staff with whom we have discussed today. 8. No flu vaccine yet  Patient and friend were comfortable with discussion and plan above. She will have CBC on 01-10-13, and I will see her again on 9-22 with lab and treatment that day if counts adequate.     Zandrea Kenealy P, MD   01/06/2013, 3:05 PM

## 2013-01-06 NOTE — Patient Instructions (Addendum)
Peripherally Inserted Central Catheter (PICC) Home Guide A peripherally inserted central catheter (PICC) is a long, thin, flexible tube that is inserted into a vein in the upper arm. It is a form of intravenous (IV) access. It is considered to be a "central" line because the tip of the PICC ends in a large vein in your chest. This large vein is called the superior vena cava (SVC). The PICC tip ends in the SVC because there is a lot of blood flow in the SVC. This allows medicines and IV fluids to be quickly distributed throughout the body. The PICC is inserted using a sterile technique by a specially trained nurse or physician. After the PICC is inserted, a chest X-ray is done to be sure it is in the correct place.  A PICC may be placed for different reasons, such as:  To give medicines and liquid nutrition that can only be given through a central line. Examples are:  Certain antibiotic treatments.  Chemotherapy.  Total parenteral nutrition (TPN).  To take frequent blood samples.  To give IV fluids and blood products.  If there is difficulty placing a peripheral intravenous (PIV) catheter. If taken care of properly, a PICC can remain in place for several months. A PICC can also allow patients to go home early. Medicine and PICC care can be managed at home by a family member or home healthcare team. RISKS AND COMPLICATIONS Possible problems with a PICC can occasionally occur. This may include:  A clot (thrombus) forming in or at the tip of the PICC. This can cause the PICC to become clogged. A "clot-busting" medicine called tissue plasminogen activator (tPA) can be inserted into the PICC to help break up the clot.  Inflammation of the vein (phlebitis) in which the PICC is placed. Signs of inflammation may include redness, pain at the insertion site, red streaks, or being able to feel a "cord" in the vein where the PICC is located.  Infection in the PICC or at the insertion site. Signs of  infection may include fever, chills, redness, swelling, or pus drainage from the PICC insertion site.  PICC movement (malposition). The PICC tip may migrate from its original position due to excessive physical activity, forceful coughing, sneezing, or vomiting.  A break or cut in the PICC. It is important to not use scissors near the PICC.  Nerve or tendon irritation or injury during PICC insertion. HOME CARE INSTRUCTIONS Activity  You may bend your arm and move it freely. If your PICC is near or at the bend of your elbow, avoid activity with repeated motion at the elbow.  Avoid lifting heavy objects as instructed by your caregiver.  Avoid using a crutch with the arm on the same side as your PICC. You may need to use a walker. PICC Dressing  Keep your PICC bandage (dressing) clean and dry to prevent infection.  Ask your caregiver when you may shower. Ask your caregiver to teach you how to wrap the PICC when you do take a shower.  Do not bathe, swim, or use hot tubs when you have a PICC.  Change the PICC dressing as instructed by your caregiver.  Change your PICC dressing if it becomes loose or wet. General PICC Care  Check the PICC insertion site daily for leakage, redness, swelling, or pain.  Flush the PICC as directed by your caregiver. Let your caregiver know right away if the PICC is difficult to flush or does not flush. Do not use force   to flush the PICC.  Do not use a syringe that is less than 10 mLs to flush the PICC.  Never pull or tug on the PICC.  Avoid blood pressure checks on the arm with the PICC.  Keep your PICC identification card with you at all times.  Do not take the PICC out yourself. Only a trained clinical professional should remove the PICC. SEEK IMMEDIATE MEDICAL CARE IF:  Your PICC is accidently pulled all the way out. If this happens, cover the insertion site with a bandage or gauze dressing. Do not throw the PICC away. Your caregiver will need to  inspect it.  Your PICC was tugged or pulled and has partially come out. Do not  push the PICC back in.  There is any type of drainage, redness, or swelling where the PICC enters the skin.  You cannot flush the PICC, it is difficult to flush, or the PICC leaks around the insertion site when it is flushed.  You hear a "flushing" sound when the PICC is flushed.  You have pain, discomfort, or numbness in your arm, shoulder, or jaw on the same side as the PICC .  You feel your heart "racing" or skipping beats.  You notice a hole or tear in the PICC.  You develop chills or a fever. MAKE SURE YOU:   Understand these instructions.  Will watch your condition.  Will get help right away if you are not doing well or get worse. Document Released: 10/15/2002 Document Revised: 07/03/2011 Document Reviewed: 08/15/2010 [employer] Patient Information 2014 [employer].  

## 2013-01-06 NOTE — Telephone Encounter (Signed)
Per staff message and POF I have scheduled appts.  JMW  

## 2013-01-07 ENCOUNTER — Encounter: Payer: Self-pay | Admitting: Oncology

## 2013-01-10 ENCOUNTER — Telehealth: Payer: Self-pay | Admitting: *Deleted

## 2013-01-10 ENCOUNTER — Other Ambulatory Visit (HOSPITAL_BASED_OUTPATIENT_CLINIC_OR_DEPARTMENT_OTHER): Payer: Medicare Other | Admitting: Lab

## 2013-01-10 ENCOUNTER — Ambulatory Visit (HOSPITAL_BASED_OUTPATIENT_CLINIC_OR_DEPARTMENT_OTHER): Payer: Medicare Other

## 2013-01-10 VITALS — BP 104/57 | HR 85 | Temp 97.6°F

## 2013-01-10 DIAGNOSIS — Z452 Encounter for adjustment and management of vascular access device: Secondary | ICD-10-CM

## 2013-01-10 DIAGNOSIS — C569 Malignant neoplasm of unspecified ovary: Secondary | ICD-10-CM

## 2013-01-10 LAB — CBC WITH DIFFERENTIAL/PLATELET
BASO%: 1.1 % (ref 0.0–2.0)
Basophils Absolute: 0.1 10*3/uL (ref 0.0–0.1)
EOS%: 1.3 % (ref 0.0–7.0)
HCT: 31.2 % — ABNORMAL LOW (ref 34.8–46.6)
HGB: 10.3 g/dL — ABNORMAL LOW (ref 11.6–15.9)
LYMPH%: 15.7 % (ref 14.0–49.7)
MCH: 29.5 pg (ref 25.1–34.0)
MCHC: 33 g/dL (ref 31.5–36.0)
MONO#: 0.6 10*3/uL (ref 0.1–0.9)
NEUT%: 71.8 % (ref 38.4–76.8)
Platelets: 77 10*3/uL — ABNORMAL LOW (ref 145–400)

## 2013-01-10 MED ORDER — SODIUM CHLORIDE 0.9 % IJ SOLN
10.0000 mL | INTRAMUSCULAR | Status: AC | PRN
  Administered 2013-01-10: 10 mL
  Filled 2013-01-10: qty 10

## 2013-01-10 MED ORDER — SODIUM CHLORIDE 0.9 % IJ SOLN
10.0000 mL | INTRAMUSCULAR | Status: DC | PRN
  Filled 2013-01-10: qty 10

## 2013-01-10 MED ORDER — HEPARIN SOD (PORK) LOCK FLUSH 100 UNIT/ML IV SOLN
500.0000 [IU] | INTRAVENOUS | Status: DC | PRN
  Filled 2013-01-10: qty 5

## 2013-01-10 MED ORDER — HEPARIN SOD (PORK) LOCK FLUSH 100 UNIT/ML IV SOLN
250.0000 [IU] | INTRAVENOUS | Status: DC | PRN
  Filled 2013-01-10: qty 5

## 2013-01-10 NOTE — Patient Instructions (Addendum)

## 2013-01-10 NOTE — Telephone Encounter (Signed)
Pt notified to continue to Hold Lovenox due to platelet count. Pt to return on Monday for lab/MD/chemo.

## 2013-01-13 ENCOUNTER — Telehealth: Payer: Self-pay | Admitting: *Deleted

## 2013-01-13 ENCOUNTER — Other Ambulatory Visit (HOSPITAL_BASED_OUTPATIENT_CLINIC_OR_DEPARTMENT_OTHER): Payer: Medicare Other | Admitting: Lab

## 2013-01-13 ENCOUNTER — Ambulatory Visit (HOSPITAL_BASED_OUTPATIENT_CLINIC_OR_DEPARTMENT_OTHER): Payer: Medicare Other

## 2013-01-13 ENCOUNTER — Ambulatory Visit (HOSPITAL_BASED_OUTPATIENT_CLINIC_OR_DEPARTMENT_OTHER): Payer: Medicare Other | Admitting: Oncology

## 2013-01-13 ENCOUNTER — Ambulatory Visit: Payer: Medicare Other | Admitting: Lab

## 2013-01-13 ENCOUNTER — Encounter: Payer: Self-pay | Admitting: Oncology

## 2013-01-13 VITALS — BP 106/65 | HR 74 | Temp 98.3°F | Resp 18 | Ht 67.0 in | Wt 137.8 lb

## 2013-01-13 DIAGNOSIS — I82409 Acute embolism and thrombosis of unspecified deep veins of unspecified lower extremity: Secondary | ICD-10-CM

## 2013-01-13 DIAGNOSIS — Z5111 Encounter for antineoplastic chemotherapy: Secondary | ICD-10-CM

## 2013-01-13 DIAGNOSIS — C569 Malignant neoplasm of unspecified ovary: Secondary | ICD-10-CM

## 2013-01-13 LAB — COMPREHENSIVE METABOLIC PANEL (CC13)
AST: 21 U/L (ref 5–34)
Albumin: 3.4 g/dL — ABNORMAL LOW (ref 3.5–5.0)
Alkaline Phosphatase: 112 U/L (ref 40–150)
BUN: 16.3 mg/dL (ref 7.0–26.0)
Creatinine: 0.7 mg/dL (ref 0.6–1.1)
Glucose: 96 mg/dl (ref 70–140)
Potassium: 4.5 mEq/L (ref 3.5–5.1)
Total Bilirubin: 0.34 mg/dL (ref 0.20–1.20)

## 2013-01-13 LAB — CBC WITH DIFFERENTIAL/PLATELET
BASO%: 0.3 % (ref 0.0–2.0)
Basophils Absolute: 0 10*3/uL (ref 0.0–0.1)
EOS%: 1.3 % (ref 0.0–7.0)
HCT: 32.4 % — ABNORMAL LOW (ref 34.8–46.6)
HGB: 10.6 g/dL — ABNORMAL LOW (ref 11.6–15.9)
MCH: 29.2 pg (ref 25.1–34.0)
MCHC: 32.7 g/dL (ref 31.5–36.0)
MCV: 89.3 fL (ref 79.5–101.0)
MONO%: 8.3 % (ref 0.0–14.0)
NEUT%: 75.9 % (ref 38.4–76.8)
lymph#: 1 10*3/uL (ref 0.9–3.3)

## 2013-01-13 MED ORDER — ACETAMINOPHEN 325 MG PO TABS
ORAL_TABLET | ORAL | Status: AC
  Filled 2013-01-13: qty 2

## 2013-01-13 MED ORDER — ONDANSETRON 8 MG/NS 50 ML IVPB
INTRAVENOUS | Status: AC
  Filled 2013-01-13: qty 8

## 2013-01-13 MED ORDER — SODIUM CHLORIDE 0.9 % IV SOLN
Freq: Once | INTRAVENOUS | Status: AC
  Administered 2013-01-13: 13:00:00 via INTRAVENOUS

## 2013-01-13 MED ORDER — DEXAMETHASONE SODIUM PHOSPHATE 10 MG/ML IJ SOLN
4.0000 mg | Freq: Once | INTRAMUSCULAR | Status: AC
  Administered 2013-01-13: 4 mg via INTRAVENOUS

## 2013-01-13 MED ORDER — ACETAMINOPHEN 325 MG PO TABS
650.0000 mg | ORAL_TABLET | Freq: Once | ORAL | Status: AC
  Administered 2013-01-13: 650 mg via ORAL

## 2013-01-13 MED ORDER — SODIUM CHLORIDE 0.9 % IJ SOLN
10.0000 mL | INTRAMUSCULAR | Status: DC | PRN
  Administered 2013-01-13: 10 mL
  Filled 2013-01-13: qty 10

## 2013-01-13 MED ORDER — DEXAMETHASONE SODIUM PHOSPHATE 10 MG/ML IJ SOLN
INTRAMUSCULAR | Status: AC
  Filled 2013-01-13: qty 1

## 2013-01-13 MED ORDER — SODIUM CHLORIDE 0.9 % IV SOLN
600.0000 mg/m2 | Freq: Once | INTRAVENOUS | Status: AC
  Administered 2013-01-13: 1064 mg via INTRAVENOUS
  Filled 2013-01-13: qty 27.98

## 2013-01-13 MED ORDER — ONDANSETRON 8 MG/50ML IVPB (CHCC)
8.0000 mg | Freq: Once | INTRAVENOUS | Status: AC
  Administered 2013-01-13: 8 mg via INTRAVENOUS

## 2013-01-13 NOTE — Patient Instructions (Signed)
PICC flush once weekly  May bathe or shower any way as long as PICC site does not get wet

## 2013-01-13 NOTE — Progress Notes (Signed)
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 Anticoagulation is now with lovenox, tho this has been held for past week due to thrombocytopenia from chemotherapy. In past ~ 2 days she has noticed some increased firmness again in left calf varicosity, not really tender and no heat or reddness. She has had no new neurologic symptoms, with slight improvement in use of right hand and speech better than just after the CVAs. She denies SOB or DOE, energy ok for past few days and appetite fairly good. No bleeding. She has power PICC in RUE. We have discussed bathing/ showering with this.   ONCOLOGIC HISTORY Patient developed constipation with some right abdominal pain in May 2013. She saw GI physician in ~ Aug 2013, but was unable to tolerate prep for colonoscopy such that this was never accomplished. Constipation improved with probiotics and Activia. She had abdominal fullness more recently, with early satiety and some reflux symptoms. She developed LLE superficial phlebitis in Jan 2014 (dopplers by PCP negative for DVT then), followed by swelling and discomfort in left medial thigh with venous doppler at [facility] in [facility] on 05-21-12 with acute thrombus left common femoral vein. 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 She was begun on lovenox and transitioned to Xarelto by Dr Everlene Other. CA 125 was 569 initially. She saw Dr Duard Brady on 06-12-12, exam remarkable for distended abdomen with fluid wave and 15 cm fixed mass in pelvis. 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 Neoadjuvant dose dense carbo/taxol was beun 06-21-12 with 3 cycles given thru 08-16-12. She needed neupogen days 2,8 and 16 on this regimen. She had good partial response by CTs 08-19-12 and had improvement in CA 125 from 1041 as baseline for the chemotherapy in late Feb 2014 down to 92 on 08-21-12 after 3 cycles. She had IVC filter placed prior to gyn oncology surgery. She went to exploratory laparotomy with extensive lysis of adhesions, left ureteral lysis, and bilateral salpingo-oophorectomy by Dr Duard Brady at [facility] on 09-10-12, which was suboptimal debulking. Operative findings: Diffuse carcinomatosis of the residual omentum involving the entire transverse colon. Transverse colon densely adherent to the anterior abdominal wall. No disease throughout the small bowel. Mesenteric adenopathy with lymph nodes measuring 1 cm, involving the root of the small bowel mesentery. ~5 centimeter right ovarian mass and ~ 6 cm left ovarian mass. Small bowel  densely adherent to the pelvis. 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 She resumed chemotherapy with dose dense taxol carboplatin on 09-27-12, completing cycle 5 on 11-08-12. CA 125 increased from 92 in April to 130 post op in June, then 225 late July and repeat 318. CT CAP done 11-19-12 showed some increase in nodular thickening along falciform ligament and small peritoneal nodule right iliac fossa, no ascites, liver ok, no pulmonary emboli, pulmonary nodules or cardiac concerns. Dr Duard Brady recommended change in chemotherapy to doxil with avastin, however echocardiogram 11-27-12 unexpectedly had EF of 15%. She received one cycle of gemzar on 12-02-12, given peripherally, then had embolic CVAs (see below). She was stable to resume gemzar with cycle 2 at 800 mg/m2 given on 12-30-12,then platelets down to 70k by day 8.   She developed acute slurring of speech on 12-04-12 and was admitted with acute embolic CVAs from 8-13 thru 12-11-12, treated with heparin and transitioned to coumadin (xarelto [facility]); she had acute worsening of symptoms 12-05-12 with reimaging showing some hemorrhagic transformation left occipital lobe. The initial nonhemorrhagic embolic infarcts were left subinsular, left frontal, left parietal, bilateral occipital and bilateral cerebellar areas. Platelets were low in hospital consistent with gemcitabine given 12-02-12; HIT returned negative. She had further cardiology evaluation in hospital by Dr Verdis Prime.  Note she had no history of cardiac disease other than palpitations prior to this new diagnosis; she had holter for palpitations several years ago   Review of systems as above, also: Bowels ok. No chest pain. Sleeping fairly well. No discomfort at PICC. Remainder of 10 point Review of Systems negative.  Objective:  Vital signs in last 24 hours:  BP 106/65   Pulse 74  Temp(Src) 98.3 F (36.8 C) (Oral)  Resp 18  Ht 5\' 7"  (1.702 m)  Wt 137 lb 12.8 oz (62.506 kg)  BMI 21.58 kg/m2 Weight is stable Alert, oriented and appropriate. Ambulatory without assistance.  Alopecia. Speech mostly fluent.  HEENT:PERRL, sclerae not icteric. Oral mucosa moist without lesions, posterior pharynx clear.  Neck supple. No JVD.  Lymphatics:no cervical,suraclavicular, axillary or inguinal adenopathy Resp: clear to auscultation bilaterally and normal percussion bilaterally Cardio: regular rate and rhythm. No gallop. GI: soft, nontender, not distended, no mass or organomegaly. Normally active bowel sounds. Surgical incision not remarkable. Musculoskeletal/ Extremities: without pitting edema or tenderness. Prominent superficial varicose veins LE bilaterally. Left calf with 1x4 cm more firm varicosity with very minimal erythema, no heat, not tender. Neuro: speech as above. No facial droop. Motor symmetrical. Skin without rash, ecchymosis, petechiae PICC-without erythema or tenderness  Lab Results:  Results for orders placed in visit on 01/13/13  CBC WITH DIFFERENTIAL      Result Value Range   WBC 6.8  3.9 - 10.3 10e3/uL   NEUT# 5.2  1.5 - 6.5 10e3/uL   HGB 10.6 (*) 11.6 - 15.9 g/dL   HCT 40.9 (*) 81.1 - 91.4 %   Platelets 133 (*) 145 - 400 10e3/uL   MCV 89.3  79.5 - 101.0 fL   MCH 29.2  25.1 - 34.0 pg   MCHC 32.7  31.5 - 36.0 g/dL   RBC 7.82 (*) 9.56 -  5.45 10e6/uL   RDW 17.2 (*) 11.2 - 14.5 %   lymph# 1.0  0.9 - 3.3 10e3/uL   MONO# 0.6  0.1 - 0.9 10e3/uL   Eosinophils Absolute 0.1  0.0 - 0.5 10e3/uL   Basophils Absolute 0.0  0.0 - 0.1 10e3/uL   NEUT% 75.9  38.4 - 76.8 %   LYMPH% 14.2  14.0 - 49.7 %   MONO% 8.3  0.0 - 14.0 %   EOS% 1.3  0.0 - 7.0 %   BASO% 0.3  0.0 - 2.0 %   nRBC 0  0 - 0 %   CMET available after visit normal with exception of albumin, tho this is improved to 3.4 from 2.7 previously    CA 125 available after visit 2223, having been  1958 on 9-2, 1230 on 8-11 and 318 in July.  Studies/Results:  No results found.  Medications: I have reviewed the patient's current medications.She will resume lovenox at 1.5 mg/kg daily. Gemzar dose has been reduced due to thrombocytopenia with last cycle, to 600 mg/m2.  DISCUSSION: we have discussed decrease in the gemzar dose, which hopefully will not drop platelets as much so that she can continue on lovenox with chemotherapy.  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 Post first cycle gemzar on 12-02-12, cycle 2 gemzar by PICC given 12-30-12. Rate of rise of CA 125 hopefully is slowing.   Decrease gemzar dose due to thrombocytopenia last cycle and follow counts weekly for now.  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 Suggestion of early thrombophlebitis left calf now. Resume lovenox today. To see Dr Pearlean Brownie 02-24-13..  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 Follow up with Dr Katrinka Blazing.  4.long tobacco recently discontinued.  5.overdue mammograms: other significant acute problems now, but we will remain aware of this if situation appropriate  6.Advance Directives and HCPOA done, but full support to that point  7.power PICC in, to be flushed with saline once weekly  8. No flu vaccine yet  Patient and friend were in agreement with plan above.  Mackena Plummer P, MD   01/13/2013, 11:57 AM

## 2013-01-13 NOTE — Telephone Encounter (Signed)
appts made and printed...td 

## 2013-01-13 NOTE — Patient Instructions (Addendum)
[facility] Discharge Instructions for Patients Receiving Chemotherapy  Today you received the following chemotherapy agents GEMZAR  To help prevent nausea and vomiting after your treatment, we encourage you to take your nausea medication MAY TAKE ABOUT 6 PM IF NEEDED   If you develop nausea and vomiting that is not controlled by your nausea medication, call the clinic.   BELOW ARE SYMPTOMS THAT SHOULD BE REPORTED IMMEDIATELY:  *FEVER GREATER THAN 100.5 F  *CHILLS WITH OR WITHOUT FEVER  NAUSEA AND VOMITING THAT IS NOT CONTROLLED WITH YOUR NAUSEA MEDICATION  *UNUSUAL SHORTNESS OF BREATH  *UNUSUAL BRUISING OR BLEEDING  TENDERNESS IN MOUTH AND THROAT WITH OR WITHOUT PRESENCE OF ULCERS  *URINARY PROBLEMS  *BOWEL PROBLEMS  UNUSUAL RASH Items with * indicate a potential emergency and should be followed up as soon as possible.  Feel free to call the clinic you have any questions or concerns. The clinic phone number is [phone].

## 2013-01-15 ENCOUNTER — Telehealth: Payer: Self-pay | Admitting: *Deleted

## 2013-01-15 NOTE — Telephone Encounter (Signed)
Patient called and moved her appt on Monday to later in the day

## 2013-01-16 ENCOUNTER — Telehealth: Payer: Self-pay | Admitting: Oncology

## 2013-01-16 ENCOUNTER — Telehealth: Payer: Self-pay | Admitting: *Deleted

## 2013-01-16 NOTE — Telephone Encounter (Signed)
Sent michelle a staff message to add the chemo appt on 01/27/2013.

## 2013-01-16 NOTE — Telephone Encounter (Signed)
Per staff message and POF I have scheduled appt. Advised scheduler to move lab/flush a[ppt

## 2013-01-17 ENCOUNTER — Telehealth: Payer: Self-pay | Admitting: *Deleted

## 2013-01-17 ENCOUNTER — Telehealth: Payer: Self-pay | Admitting: Oncology

## 2013-01-17 NOTE — Telephone Encounter (Signed)
Patient called and moved her appts for 10/6 to eariler in the morning

## 2013-01-17 NOTE — Telephone Encounter (Signed)
S/w the pt and she is aware of the changes on her appt times for 01/27/2013.

## 2013-01-20 ENCOUNTER — Ambulatory Visit (HOSPITAL_BASED_OUTPATIENT_CLINIC_OR_DEPARTMENT_OTHER): Payer: Medicare Other

## 2013-01-20 VITALS — BP 112/66 | HR 77 | Temp 98.2°F | Resp 16

## 2013-01-20 DIAGNOSIS — C569 Malignant neoplasm of unspecified ovary: Secondary | ICD-10-CM

## 2013-01-20 DIAGNOSIS — Z452 Encounter for adjustment and management of vascular access device: Secondary | ICD-10-CM

## 2013-01-20 MED ORDER — SODIUM CHLORIDE 0.9 % IJ SOLN
10.0000 mL | INTRAMUSCULAR | Status: DC | PRN
  Filled 2013-01-20: qty 10

## 2013-01-20 MED ORDER — HEPARIN SOD (PORK) LOCK FLUSH 100 UNIT/ML IV SOLN
500.0000 [IU] | Freq: Once | INTRAVENOUS | Status: DC
  Filled 2013-01-20: qty 5

## 2013-01-20 MED ORDER — HEPARIN SOD (PORK) LOCK FLUSH 100 UNIT/ML IV SOLN
500.0000 [IU] | Freq: Once | INTRAVENOUS | Status: AC
  Administered 2013-01-20: 250 [IU] via INTRAVENOUS
  Filled 2013-01-20: qty 5

## 2013-01-20 MED ORDER — SODIUM CHLORIDE 0.9 % IJ SOLN
10.0000 mL | INTRAMUSCULAR | Status: DC | PRN
  Administered 2013-01-20: 10 mL via INTRAVENOUS
  Filled 2013-01-20: qty 10

## 2013-01-21 ENCOUNTER — Emergency Department (HOSPITAL_COMMUNITY): Payer: Medicare Other

## 2013-01-21 ENCOUNTER — Telehealth: Payer: Self-pay | Admitting: *Deleted

## 2013-01-21 ENCOUNTER — Other Ambulatory Visit: Payer: Self-pay

## 2013-01-21 ENCOUNTER — Inpatient Hospital Stay (HOSPITAL_COMMUNITY)
Admission: EM | Admit: 2013-01-21 | Discharge: 2013-01-28 | DRG: 065 | Disposition: A | Discharge status: Home / Self Care | Payer: Medicare Other | Attending: Internal Medicine | Admitting: Internal Medicine

## 2013-01-21 ENCOUNTER — Encounter (HOSPITAL_COMMUNITY): Payer: Self-pay | Admitting: Emergency Medicine

## 2013-01-21 DIAGNOSIS — I428 Other cardiomyopathies: Secondary | ICD-10-CM | POA: Diagnosis present

## 2013-01-21 DIAGNOSIS — I619 Nontraumatic intracerebral hemorrhage, unspecified: Secondary | ICD-10-CM

## 2013-01-21 DIAGNOSIS — I6350 Cerebral infarction due to unspecified occlusion or stenosis of unspecified cerebral artery: Principal | ICD-10-CM

## 2013-01-21 DIAGNOSIS — R7989 Other specified abnormal findings of blood chemistry: Secondary | ICD-10-CM

## 2013-01-21 DIAGNOSIS — Z7901 Long term (current) use of anticoagulants: Secondary | ICD-10-CM

## 2013-01-21 DIAGNOSIS — D6959 Other secondary thrombocytopenia: Secondary | ICD-10-CM | POA: Diagnosis present

## 2013-01-21 DIAGNOSIS — Z86718 Personal history of other venous thrombosis and embolism: Secondary | ICD-10-CM

## 2013-01-21 DIAGNOSIS — I5023 Acute on chronic systolic (congestive) heart failure: Secondary | ICD-10-CM

## 2013-01-21 DIAGNOSIS — F411 Generalized anxiety disorder: Secondary | ICD-10-CM | POA: Diagnosis present

## 2013-01-21 DIAGNOSIS — I4729 Other ventricular tachycardia: Secondary | ICD-10-CM | POA: Diagnosis present

## 2013-01-21 DIAGNOSIS — K219 Gastro-esophageal reflux disease without esophagitis: Secondary | ICD-10-CM | POA: Diagnosis present

## 2013-01-21 DIAGNOSIS — E876 Hypokalemia: Secondary | ICD-10-CM

## 2013-01-21 DIAGNOSIS — I472 Ventricular tachycardia, unspecified: Secondary | ICD-10-CM | POA: Diagnosis present

## 2013-01-21 DIAGNOSIS — Z23 Encounter for immunization: Secondary | ICD-10-CM

## 2013-01-21 DIAGNOSIS — C569 Malignant neoplasm of unspecified ovary: Secondary | ICD-10-CM | POA: Diagnosis present

## 2013-01-21 DIAGNOSIS — I63432 Cerebral infarction due to embolism of left posterior cerebral artery: Secondary | ICD-10-CM

## 2013-01-21 DIAGNOSIS — R471 Dysarthria and anarthria: Secondary | ICD-10-CM | POA: Diagnosis present

## 2013-01-21 DIAGNOSIS — I427 Cardiomyopathy due to drug and external agent: Secondary | ICD-10-CM

## 2013-01-21 DIAGNOSIS — I509 Heart failure, unspecified: Secondary | ICD-10-CM | POA: Diagnosis present

## 2013-01-21 DIAGNOSIS — Z8672 Personal history of thrombophlebitis: Secondary | ICD-10-CM

## 2013-01-21 DIAGNOSIS — D6859 Other primary thrombophilia: Secondary | ICD-10-CM | POA: Diagnosis present

## 2013-01-21 DIAGNOSIS — I5020 Unspecified systolic (congestive) heart failure: Secondary | ICD-10-CM

## 2013-01-21 DIAGNOSIS — R4701 Aphasia: Secondary | ICD-10-CM | POA: Diagnosis present

## 2013-01-21 DIAGNOSIS — Z85828 Personal history of other malignant neoplasm of skin: Secondary | ICD-10-CM

## 2013-01-21 DIAGNOSIS — Z87891 Personal history of nicotine dependence: Secondary | ICD-10-CM

## 2013-01-21 DIAGNOSIS — Z79899 Other long term (current) drug therapy: Secondary | ICD-10-CM

## 2013-01-21 DIAGNOSIS — I69922 Dysarthria following unspecified cerebrovascular disease: Secondary | ICD-10-CM

## 2013-01-21 DIAGNOSIS — IMO0002 Reserved for concepts with insufficient information to code with codable children: Secondary | ICD-10-CM

## 2013-01-21 DIAGNOSIS — I5022 Chronic systolic (congestive) heart failure: Secondary | ICD-10-CM | POA: Diagnosis present

## 2013-01-21 DIAGNOSIS — T451X5A Adverse effect of antineoplastic and immunosuppressive drugs, initial encounter: Secondary | ICD-10-CM | POA: Diagnosis present

## 2013-01-21 DIAGNOSIS — D649 Anemia, unspecified: Secondary | ICD-10-CM

## 2013-01-21 DIAGNOSIS — R748 Abnormal levels of other serum enzymes: Secondary | ICD-10-CM | POA: Diagnosis present

## 2013-01-21 DIAGNOSIS — Z8673 Personal history of transient ischemic attack (TIA), and cerebral infarction without residual deficits: Secondary | ICD-10-CM | POA: Diagnosis present

## 2013-01-21 DIAGNOSIS — D696 Thrombocytopenia, unspecified: Secondary | ICD-10-CM | POA: Diagnosis present

## 2013-01-21 DIAGNOSIS — I639 Cerebral infarction, unspecified: Secondary | ICD-10-CM | POA: Diagnosis present

## 2013-01-21 HISTORY — DX: Cerebral infarction, unspecified: I63.9

## 2013-01-21 HISTORY — DX: Shortness of breath: R06.02

## 2013-01-21 HISTORY — DX: Heart failure, unspecified: I50.9

## 2013-01-21 HISTORY — DX: Unspecified chronic bronchitis: J42

## 2013-01-21 HISTORY — DX: Acute embolism and thrombosis of unspecified deep veins of unspecified lower extremity: I82.409

## 2013-01-21 HISTORY — DX: Basal cell carcinoma of skin of unspecified parts of face: C44.310

## 2013-01-21 LAB — POCT I-STAT, CHEM 8
BUN: 12 mg/dL (ref 6–23)
HCT: 38 % (ref 36.0–46.0)
Hemoglobin: 12.9 g/dL (ref 12.0–15.0)
Potassium: 4 mEq/L (ref 3.5–5.1)
Sodium: 139 mEq/L (ref 135–145)
TCO2: 24 mmol/L (ref 0–100)

## 2013-01-21 LAB — DIFFERENTIAL
Basophils Absolute: 0 10*3/uL (ref 0.0–0.1)
Basophils Relative: 0 % (ref 0–1)
Eosinophils Relative: 1 % (ref 0–5)
Lymphocytes Relative: 20 % (ref 12–46)
Lymphs Abs: 0.9 10*3/uL (ref 0.7–4.0)
Monocytes Absolute: 0.6 10*3/uL (ref 0.1–1.0)
Neutro Abs: 3.1 10*3/uL (ref 1.7–7.7)

## 2013-01-21 LAB — CBC
Hemoglobin: 9.4 g/dL — ABNORMAL LOW (ref 12.0–15.0)
MCH: 29 pg (ref 26.0–34.0)
MCV: 88.3 fL (ref 78.0–100.0)
RBC: 3.24 MIL/uL — ABNORMAL LOW (ref 3.87–5.11)
WBC: 4.6 10*3/uL (ref 4.0–10.5)

## 2013-01-21 LAB — URINALYSIS, ROUTINE W REFLEX MICROSCOPIC
Bilirubin Urine: NEGATIVE
Leukocytes, UA: NEGATIVE
Nitrite: NEGATIVE
Protein, ur: NEGATIVE mg/dL
Specific Gravity, Urine: 1.021 (ref 1.005–1.030)
Urobilinogen, UA: 0.2 mg/dL (ref 0.0–1.0)
pH: 6 (ref 5.0–8.0)

## 2013-01-21 LAB — COMPREHENSIVE METABOLIC PANEL
AST: 31 U/L (ref 0–37)
BUN: 12 mg/dL (ref 6–23)
CO2: 24 mEq/L (ref 19–32)
Chloride: 100 mEq/L (ref 96–112)
Creatinine, Ser: 0.64 mg/dL (ref 0.50–1.10)
GFR calc Af Amer: >90 mL/min (ref >90)
GFR calc non Af Amer: >90 mL/min (ref >90)
Total Bilirubin: 0.2 mg/dL — ABNORMAL LOW (ref 0.3–1.2)

## 2013-01-21 LAB — URINE MICROSCOPIC-ADD ON

## 2013-01-21 LAB — POCT I-STAT TROPONIN I: Troponin i, poc: 0.36 ng/mL (ref 0.00–0.08)

## 2013-01-21 LAB — APTT: aPTT: 29 seconds (ref 24–37)

## 2013-01-21 LAB — TROPONIN I
Troponin I: 0.43 ng/mL (ref <0.30)
Troponin I: 0.41 ng/mL (ref <0.30)

## 2013-01-21 LAB — RAPID URINE DRUG SCREEN, HOSP PERFORMED
Opiates: NOT DETECTED
Tetrahydrocannabinol: NOT DETECTED

## 2013-01-21 LAB — ANTITHROMBIN III: AntiThromb III Func: 105 % (ref 75–120)

## 2013-01-21 LAB — PROTIME-INR: Prothrombin Time: 13.3 seconds (ref 11.6–15.2)

## 2013-01-21 MED ORDER — METOPROLOL SUCCINATE 12.5 MG HALF TABLET
12.5000 mg | ORAL_TABLET | Freq: Two times a day (BID) | ORAL | Status: DC
  Administered 2013-01-22 – 2013-01-24 (×4): 12.5 mg via ORAL
  Filled 2013-01-21 (×10): qty 1

## 2013-01-21 MED ORDER — OXYCODONE-ACETAMINOPHEN 5-325 MG PO TABS: 1.0000 | ORAL_TABLET | ORAL | Status: DC | PRN

## 2013-01-21 MED ORDER — SENNOSIDES-DOCUSATE SODIUM 8.6-50 MG PO TABS
1.0000 | ORAL_TABLET | Freq: Every evening | ORAL | Status: DC | PRN
  Administered 2013-01-22 – 2013-01-27 (×5): 1 via ORAL
  Filled 2013-01-21 (×3): qty 2

## 2013-01-21 MED ORDER — SENNA-DOCUSATE SODIUM 8.6-50 MG PO TABS: 1.0000 | ORAL_TABLET | Freq: Every day | ORAL | Status: DC | PRN

## 2013-01-21 MED ORDER — POLYETHYLENE GLYCOL 3350 17 G PO PACK
17.0000 g | PACK | Freq: Every day | ORAL | Status: DC | PRN
  Filled 2013-01-21: qty 1

## 2013-01-21 MED ORDER — HEPARIN (PORCINE) IN NACL 100-0.45 UNIT/ML-% IJ SOLN
850.0000 [IU]/h | INTRAMUSCULAR | Status: DC
  Administered 2013-01-21: 850 [IU]/h via INTRAVENOUS
  Filled 2013-01-21: qty 250

## 2013-01-21 MED ORDER — PANTOPRAZOLE SODIUM 40 MG PO TBEC
40.0000 mg | DELAYED_RELEASE_TABLET | Freq: Every day | ORAL | Status: DC
  Administered 2013-01-22 – 2013-01-28 (×7): 40 mg via ORAL
  Filled 2013-01-21 (×6): qty 1

## 2013-01-21 MED ORDER — STROKE: EARLY STAGES OF RECOVERY BOOK
Freq: Once | Status: AC
  Administered 2013-01-21: 22:00:00
  Filled 2013-01-21: qty 1

## 2013-01-21 MED ORDER — LORAZEPAM 0.5 MG PO TABS: 0.5000 mg | ORAL_TABLET | Freq: Three times a day (TID) | ORAL | Status: DC | PRN

## 2013-01-21 NOTE — Consult Note (Signed)
NEURO HOSPITALIST CONSULT NOTE    Reason for Consult: dysarthria, transient inability to speak.  HPI:                                                                                                                                          Monique [name] is an 67 y.o. 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 She said that she her Lovenox was resumed 8 days ago and yesterday after her Lovenox shot she was talking on the phone with her cancer nurse when suddenly started having slurred speech and was unable to speak for few seconds. She was asked by the nurse to present to the ED for further evaluation where she had a brain MRI that disclosed several small infarcts in the right peri-Rolandic cortex and right cerebellar hemisphere, acute to subacute in appearance. She denies associated HA, vertigo, double vision, difficulty swallowing, focal weakness or numbness, confusion. [name] that her speech and language are improving. Prior stroke last August occurred while on xarelto and she was heparinized in the acute stroke setting.  She was discharged on coumadin (did not clinical cerebrovascular events while on coumadin for few weeks), then switched to Lovenox. Stroke work up carried out at that time wasunrevealing.         Past Medical History  Diagnosis Date  . Arthritis   . Acute venous embolism and thrombosis of unspecified deep vessels of lower extremity   . Bronchitis     several times  . Abdominal or pelvic swelling, mass or lump, unspecified site   . Phlebitis and thrombophlebitis of unspecified site   . Pelvic mass   . Skin cancer   .  Ovarian cancer   . Anxiety   . GERD (gastroesophageal reflux disease)   . Anemia   . PONV (postoperative nausea and vomiting)     "when fentanyl is used, will break out in itchy rast"  . Stroke     Past Surgical History  Procedure Laterality Date  . Appendectomy  1966    ruptured, hospital for 3 weeks  . Rhinoplasty  1976  . Vein ligation and stripping  1998    right leg  . Mohs surgery  2004, 2005    basal cell of the face  . Tooth extraction    . Laparotomy N/A 09/10/2012    Procedure: EXPLORATORY LAPAROTOMY, BILATERAL SALPINGO OOPHORECTOMY, TUMOR DEBULKING;  Surgeon: Rejeana Brock A. Duard Brady, MD;  Location: [facility];  Service: Gynecology;  Laterality: N/A;  .  Exploratory laparotomy  09/10/12    Lysis of adhesions, BSO, suboptimal tumor debulking    Family History  Problem Relation Age of Onset  . Pneumonia Mother   . Heart attack Father     Social History:  reports that she quit smoking about 9 months ago. Her smoking use included Cigarettes. She smoked 0.00 packs per day for 30 years. She has never used smokeless tobacco. She reports that she does not drink alcohol or use illicit drugs.  Allergies  Allergen Reactions  . Septra [Sulfamethoxazole-Tmp Ds] Rash and Other (See Comments)    High fever, Burning skin  . Ampicillin Itching  . Fentanyl And Related Hives    Per Dr. Acey Lav, pt reports she is ok with fentanyl (09/10/12). Patient states it was epidural form of Fentanyl that she reacted to.   . Neosporin [Neomycin-Bacitracin Zn-Polymyx] Swelling and Other (See Comments)    opthalmic solution only can use topically. Swelling and inflamed eyes    MEDICATIONS:                                                                                                                     I have reviewed the patient's current medications.   ROS:                                                                                                                                       History  obtained from the patient and chart review.  General ROS: negative for - chills, fatigue, fever, night sweats, weight gain or weight loss Psychological ROS: negative for - behavioral disorder, hallucinations, memory difficulties, mood swings or suicidal ideation Ophthalmic ROS: negative for - double vision, eye pain or loss of vision ENT ROS: negative for - epistaxis, nasal discharge, oral lesions, sore throat, tinnitus or vertigo Allergy and Immunology ROS: negative for - hives or itchy/watery eyes Hematological and Lymphatic ROS: negative for - bruising or swollen lymph nodes Endocrine ROS: negative for - galactorrhea, hair pattern changes, polydipsia/polyuria or temperature intolerance Respiratory ROS: negative for - cough, hemoptysis, shortness of breath or wheezing Cardiovascular ROS: negative for - chest pain, dyspnea on exertion, edema or irregular heartbeat Gastrointestinal ROS: negative for - abdominal pain, diarrhea, hematemesis, nausea/vomiting or stool incontinence Genito-Urinary ROS: negative for - dysuria, hematuria, incontinence or urinary frequency/urgency Musculoskeletal ROS: negative for - joint swelling or muscular weakness Neurological ROS: as noted in HPI Dermatological  ROS: negative for rash and skin lesion changes   Physical exam: pleasant female in no apparent distress. Blood pressure 101/45, pulse 58, temperature 98.5 F (36.9 C), temperature source Oral, resp. rate 19, SpO2 99.00%. Head: normocephalic. Neck: supple, no bruits, no JVD. Cardiac: no murmurs. Lungs: clear. Abdomen: soft, no tender, no mass. Extremities: no edema.  Neurologic Examination:                                                                                                      Mental Status: Alert, oriented, thought content appropriate.  Speech fluent without evidence of aphasia.  Able to follow 3 step commands without difficulty. Cranial Nerves: II: Discs flat bilaterally; Visual fields  grossly normal, pupils equal, round, reactive to light and accommodation III,IV, VI: ptosis not present, extra-ocular motions intact bilaterally V,VII: smile symmetric, facial light touch sensation normal bilaterally VIII: hearing normal bilaterally IX,X: gag reflex present XI: bilateral shoulder shrug XII: midline tongue extension Motor: Right : Upper extremity   5/5    Left:     Upper extremity   5/5  Lower extremity   5/5     Lower extremity   5/5 Tone and bulk:normal tone throughout; no atrophy noted Sensory: Pinprick and light touch intact throughout, bilaterally Deep Tendon Reflexes:  Right: Upper Extremity   Left: Upper extremity   biceps (C-5 to C-6) 2/4   biceps (C-5 to C-6) 2/4 tricep (C7) 2/4    triceps (C7) 2/4 Brachioradialis (C6) 2/4  Brachioradialis (C6) 2/4  Lower Extremity Lower Extremity  quadriceps (L-2 to L-4) 2/4   quadriceps (L-2 to L-4) 2/4 Achilles (S1) 2/4   Achilles (S1) 2/4  Plantars: Right: downgoing   Left: downgoing Cerebellar: normal finger-to-nose,  normal heel-to-shin test Gait:  No ataxia. CV: pulses palpable throughout    Lab Results  Component Value Date/Time   CHOL 148 12/05/2012  4:55 AM    Results for orders placed during the hospital encounter of 01/21/13 (from the past 48 hour(s))  ETHANOL     Status: None   Collection Time    01/21/13 11:40 AM      Result Value Range   Alcohol, Ethyl (B) <11  0 - 11 mg/dL   Comment:            LOWEST DETECTABLE LIMIT FOR     SERUM ALCOHOL IS 11 mg/dL     FOR MEDICAL PURPOSES ONLY     Performed at [facility]  PROTIME-INR     Status: None   Collection Time    01/21/13 11:40 AM      Result Value Range   Prothrombin Time 13.3  11.6 - 15.2 seconds   INR 1.03  0.00 - 1.49  APTT     Status: None   Collection Time    01/21/13 11:40 AM      Result Value Range   aPTT 29  24 - 37 seconds  CBC     Status: Abnormal   Collection Time    01/21/13 11:40 AM      Result  Value Range   WBC  4.6  4.0 - 10.5 K/uL   RBC 3.24 (*) 3.87 - 5.11 MIL/uL   Hemoglobin 9.4 (*) 12.0 - 15.0 g/dL   HCT 16.1 (*) 09.6 - 04.5 %   MCV 88.3  78.0 - 100.0 fL   MCH 29.0  26.0 - 34.0 pg   MCHC 32.9  30.0 - 36.0 g/dL   RDW 40.9 (*) 81.1 - 91.4 %   Platelets 102 (*) 150 - 400 K/uL   Comment: SPECIMEN CHECKED FOR CLOTS  DIFFERENTIAL     Status: Abnormal   Collection Time    01/21/13 11:40 AM      Result Value Range   Neutrophils Relative % 66  43 - 77 %   Lymphocytes Relative 20  12 - 46 %   Monocytes Relative 13 (*) 3 - 12 %   Eosinophils Relative 1  0 - 5 %   Basophils Relative 0  0 - 1 %   Neutro Abs 3.1  1.7 - 7.7 K/uL   Lymphs Abs 0.9  0.7 - 4.0 K/uL   Monocytes Absolute 0.6  0.1 - 1.0 K/uL   Eosinophils Absolute 0.0  0.0 - 0.7 K/uL   Basophils Absolute 0.0  0.0 - 0.1 K/uL   RBC Morphology POLYCHROMASIA PRESENT     Comment: ELLIPTOCYTES   Smear Review PLATELET COUNT CONFIRMED BY SMEAR     Comment: LARGE PLATELETS PRESENT  COMPREHENSIVE METABOLIC PANEL     Status: Abnormal   Collection Time    01/21/13 11:40 AM      Result Value Range   Sodium 135  135 - 145 mEq/L   Potassium 3.9  3.5 - 5.1 mEq/L   Chloride 100  96 - 112 mEq/L   CO2 24  19 - 32 mEq/L   Glucose, Bld 98  70 - 99 mg/dL   BUN 12  6 - 23 mg/dL   Creatinine, Ser 7.82  0.50 - 1.10 mg/dL   Calcium 9.6  8.4 - 95.6 mg/dL   Total Protein 7.3  6.0 - 8.3 g/dL   Albumin 3.2 (*) 3.5 - 5.2 g/dL   AST 31  0 - 37 U/L   ALT 25  0 - 35 U/L   Alkaline Phosphatase 110  39 - 117 U/L   Total Bilirubin 0.2 (*) 0.3 - 1.2 mg/dL   GFR calc non Af Amer >90  >90 mL/min   GFR calc Af Amer >90  >90 mL/min   Comment: (NOTE)     The eGFR has been calculated using the CKD EPI equation.     This calculation has not been validated in all clinical situations.     eGFR's persistently <90 mL/min signify possible Chronic Kidney     Disease.  TROPONIN I     Status: Abnormal   Collection Time    01/21/13 11:40 AM      Result Value Range    Troponin I 0.43 (*) <0.30 ng/mL   Comment:            Due to the release kinetics of cTnI,     a negative result within the first hours     of the onset of symptoms does not rule out     myocardial infarction with certainty.     If myocardial infarction is still suspected,     repeat the test at appropriate intervals.     CRITICAL RESULT CALLED TO, READ BACK BY AND VERIFIED WITH:  R. KESSLER RN AT 1205 ON 09.30     .14 BY SHUEA  POCT I-STAT TROPONIN I     Status: Abnormal   Collection Time    01/21/13 11:45 AM      Result Value Range   Troponin i, poc 0.36 (*) 0.00 - 0.08 ng/mL   Comment NOTIFIED PHYSICIAN     Comment 3            Comment: Due to the release kinetics of cTnI,     a negative result within the first hours     of the onset of symptoms does not rule out     myocardial infarction with certainty.     If myocardial infarction is still suspected,     repeat the test at appropriate intervals.  POCT I-STAT, CHEM 8     Status: None   Collection Time    01/21/13 11:47 AM      Result Value Range   Sodium 139  135 - 145 mEq/L   Potassium 4.0  3.5 - 5.1 mEq/L   Chloride 102  96 - 112 mEq/L   BUN 12  6 - 23 mg/dL   Creatinine, Ser 1.61  0.50 - 1.10 mg/dL   Glucose, Bld 97  70 - 99 mg/dL   Calcium, Ion 0.96  0.45 - 1.30 mmol/L   TCO2 24  0 - 100 mmol/L   Hemoglobin 12.9  12.0 - 15.0 g/dL   HCT 40.9  81.1 - 91.4 %  URINE RAPID DRUG SCREEN (HOSP PERFORMED)     Status: None   Collection Time    01/21/13  1:51 PM      Result Value Range   Opiates NONE DETECTED  NONE DETECTED   Cocaine NONE DETECTED  NONE DETECTED   Benzodiazepines NONE DETECTED  NONE DETECTED   Amphetamines NONE DETECTED  NONE DETECTED   Tetrahydrocannabinol NONE DETECTED  NONE DETECTED   Barbiturates NONE DETECTED  NONE DETECTED   Comment:            DRUG SCREEN FOR MEDICAL PURPOSES     ONLY.  IF CONFIRMATION IS NEEDED     FOR ANY PURPOSE, NOTIFY LAB     WITHIN 5 DAYS.                LOWEST  DETECTABLE LIMITS     FOR URINE DRUG SCREEN     Drug Class       Cutoff (ng/mL)     Amphetamine      1000     Barbiturate      200     Benzodiazepine   200     Tricyclics       300     Opiates          300     Cocaine          300     THC              50  URINALYSIS, ROUTINE W REFLEX MICROSCOPIC     Status: Abnormal   Collection Time    01/21/13  1:51 PM      Result Value Range   Color, Urine YELLOW  YELLOW   APPearance CLEAR  CLEAR   Specific Gravity, Urine 1.021  1.005 - 1.030   pH 6.0  5.0 - 8.0   Glucose, UA NEGATIVE  NEGATIVE mg/dL   Hgb urine dipstick TRACE (*) NEGATIVE   Bilirubin Urine NEGATIVE  NEGATIVE  Ketones, ur NEGATIVE  NEGATIVE mg/dL   Protein, ur NEGATIVE  NEGATIVE mg/dL   Urobilinogen, UA 0.2  0.0 - 1.0 mg/dL   Nitrite NEGATIVE  NEGATIVE   Leukocytes, UA NEGATIVE  NEGATIVE  URINE MICROSCOPIC-ADD ON     Status: None   Collection Time    01/21/13  1:51 PM      Result Value Range   Squamous Epithelial / LPF RARE  RARE   WBC, UA 3-6  <3 WBC/hpf   RBC / HPF 3-6  <3 RBC/hpf   Bacteria, UA RARE  RARE   Urine-Other RARE YEAST    TROPONIN I     Status: Abnormal   Collection Time    01/21/13  7:28 PM      Result Value Range   Troponin I 0.41 (*) <0.30 ng/mL   Comment:            Due to the release kinetics of cTnI,     a negative result within the first hours     of the onset of symptoms does not rule out     myocardial infarction with certainty.     If myocardial infarction is still suspected,     repeat the test at appropriate intervals.     REPEATED TO VERIFY     CRITICAL RESULT CALLED TO, READ BACK BY AND VERIFIED WITH:     A DUNGON,RN 2051 01/21/13 WBOND    Ct Head Wo Contrast  01/21/2013   CLINICAL DATA:  Slurred speech.  EXAM: CT HEAD WITHOUT CONTRAST  TECHNIQUE: Contiguous axial images were obtained from the base of the skull through the vertex without intravenous contrast.  COMPARISON:  CT 12/06/2012  FINDINGS: Generalized atrophy. Mild  ventricular enlargement, stable.  Chronic infarct left cerebellum is unchanged. No acute infarct. Negative for hemorrhage or mass lesion.  IMPRESSION: No acute abnormality and no change from the prior CT.   Electronically Signed   By: Marlan Palau M.D.   On: 01/21/2013 12:38   Mr Brain Wo Contrast  01/21/2013   CLINICAL DATA:  67 year old female with sudden onset of loss of speech. Recent stroke, last month. History of ovarian cancer.  EXAM: MRI HEAD WITHOUT CONTRAST  TECHNIQUE: Multiplanar, multisequence MR imaging was performed. No intravenous contrast was administered.  COMPARISON:  Head CT without contrast 01/21/2013. Brain MRI 12/05/2012.  FINDINGS: Major intracranial vascular flow voids are stable and within normal limits.  Regressed diffusion signal abnormality in the left hemisphere. No definite residual restricted diffusion. Developing gliosis/encephalomalacia at the frontal operculum (series 6, images 16 and 17).  Scattered small mostly cortically based areas of restricted diffusion in the superior right hemisphere have in part regressed, but could some new areas of involvement which do appear restricted are identified (series 3, image 24 and series 300, image 24) mild associated T2 and FLAIR hyperintensity. No associated mass effect or hemorrhage. No chronic blood products identified at the areas of previous insult.  No deep gray matter or brainstem and diffusion abnormality.  Regressed diffusion abnormality in the cerebellum. Evolved linear infarcts in the left cerebellum. In the right hemisphere there is a small linear area of diffusion abnormality which appears mildly restricted (series 3, image 9 and series 300, image 9). No associated mass effect or hemorrhage.  Stable ventricle size and configuration. No midline shift, mass effect, or evidence of intracranial mass lesion. No acute intracranial hemorrhage identified. Negative pituitary, cervicomedullary junction, and visualized cervical spine.  Normal bone marrow signal. Stable cerebral  volume.  Visualized orbit soft tissues are within normal limits. Visualized paranasal sinuses and mastoids are clear. Normal visible internal auditory structures. Negative scalp soft tissues.  IMPRESSION: 1. Continued development of small infarcts in the right peri-Rolandic cortex and right cerebellar hemisphere. Several small infarcts appear to be acute to early subacute. No associated mass effect or hemorrhage.  2. Otherwise regressed diffusion abnormality in the brain since 12/05/2012. Expected evolution of the previously-seen larger infarcts, including at the left frontal operculum and left cerebellar hemisphere.   Electronically Signed   By: Augusto Gamble M.D.   On: 01/21/2013 13:43     Assessment/Plan: 67 Y/O with advanced bilateral ovarian cancer, NICM EF 15%, and recurrent strokes despite xarelto and Lovenox, most likely due to cerebral embolism in the context of hypercoagulable state. She is doing well at this moment.  Challenging case as she had had stroke in Lovenox and Xarelto. Will suggest IV heparin no bolus in the acute setting with conversion to coumadin if no contraindication from the oncologic standpoint, as she did not have stroke while on coumadin (although coumadin was utilized only for a short period of time Stroke team to resume care in am.   Wyatt Portela, MD [facility] [phone]  01/21/2013, 9:22 PM

## 2013-01-21 NOTE — Progress Notes (Signed)
ANTICOAGULATION CONSULT NOTE - Initial Consult  Pharmacy Consult for Heparin Indication: stroke  Allergies  Allergen Reactions  . Septra [Sulfamethoxazole-Tmp Ds] Rash and Other (See Comments)    High fever, Burning skin  . Ampicillin Itching  . Fentanyl And Related Hives    Per Dr. Acey Lav, pt reports she is ok with fentanyl (09/10/12). Patient states it was epidural form of Fentanyl that she reacted to.   . Neosporin [Neomycin-Bacitracin Zn-Polymyx] Swelling and Other (See Comments)    opthalmic solution only can use topically. Swelling and inflamed eyes    Patient Measurements: Height: 5\' 7"  (170.2 cm) Weight: 137 lb 12.8 oz (62.506 kg) IBW/kg (Calculated) : 61.6  Vital Signs: Temp: 98.5 F (36.9 C) (09/30 1803) Temp src: Oral (09/30 1803) BP: 101/45 mmHg (09/30 2000) Pulse Rate: 58 (09/30 2000)  Labs:  Recent Labs  01/21/13 1140 01/21/13 1147 01/21/13 1928  HGB 9.4* 12.9  --   HCT 28.6* 38.0  --   PLT 102*  --   --   APTT 29  --   --   LABPROT 13.3  --   --   INR 1.03  --   --   CREATININE 0.64 0.80  --   TROPONINI 0.43*  --  0.41*    The CrCl is unknown because both a height and weight (above a minimum accepted value) are required for this calculation.   Medical History: Past Medical History  Diagnosis Date  . Arthritis   . Acute venous embolism and thrombosis of unspecified deep vessels of lower extremity   . Bronchitis     several times  . Abdominal or pelvic swelling, mass or lump, unspecified site   . Phlebitis and thrombophlebitis of unspecified site   . Pelvic mass   . Skin cancer   . Ovarian cancer   . Anxiety   . GERD (gastroesophageal reflux disease)   . Anemia   . PONV (postoperative nausea and vomiting)     "when fentanyl is used, will break out in itchy rast"  . Stroke     Medications:  Prescriptions prior to admission  Medication Sig Dispense Refill  . lisinopril (PRINIVIL,ZESTRIL) 2.5 MG tablet Take 1 tablet (2.5 mg total) by  mouth daily.  30 tablet  1  . LORazepam (ATIVAN) 0.5 MG tablet Take 0.5 mg by mouth every 8 (eight) hours as needed for anxiety (takes after Chemo treatment).       . metoprolol succinate (TOPROL-XL) 25 MG 24 hr tablet Take 12.5 mg by mouth 2 (two) times daily.      Marland Kitchen oxyCODONE-acetaminophen (PERCOCET/ROXICET) 5-325 MG per tablet Take 1 tablet by mouth every 4 (four) hours as needed for pain.       . pantoprazole (PROTONIX) 40 MG tablet Take 1 tablet (40 mg total) by mouth daily.  30 tablet  1  . polyethylene glycol (MIRALAX / GLYCOLAX) packet Take 17 g by mouth daily as needed (for constipation).       . sennosides-docusate sodium (SENOKOT-S) 8.6-50 MG tablet Take 1-2 tablets by mouth daily as needed for constipation.      Marland Kitchen spironolactone (ALDACTONE) 25 MG tablet Take 1 tablet (25 mg total) by mouth daily.  30 tablet  1  . enoxaparin (LOVENOX) 100 MG/ML injection Inject 1 mL (100 mg total) into the skin daily.  30 Syringe  3    Assessment: 67 yo F admitted 01/21/2013 with stroke despite treatment with  enoxaparin PTA.  Pharmacy consulted to start heparin  without a bolus and eventual conversion to warfarin. CT and MR negative for hemorrhage  PMH:  Ovarian CA, stroke 8/14, DVT s/p IVC filter, EF 15%  Coag: stroke in setting of presumed hypercoagulable state due to CA.  Will start heparin without bolus. Not recent thrombocytopenia, currently 102, will follow platelets closely  Goal of Therapy:  Heparin level 0.3-0.5 units/ml Monitor platelets by anticoagulation protocol: Yes   Plan:  1. Heparin 850 units/hr 2. Check heparin level 8h after ggt starts 3. Daily heparin level and cbc, follow up plan for warfarin.   Thank you for allowing pharmacy to be a part of this patients care team.  Lovenia Kim Pharm.D., BCPS Clinical Pharmacist 01/21/2013 10:12 PM Pager: [phone] Phone: [phone]

## 2013-01-21 NOTE — ED Notes (Signed)
Lab called, etoh lab being sent to [facility]

## 2013-01-21 NOTE — ED Notes (Signed)
Patient transported to MRI 

## 2013-01-21 NOTE — ED Notes (Signed)
[facility] at bedside 

## 2013-01-21 NOTE — ED Notes (Signed)
Pt was on the phone with [facility] RN asking if it is all right for her to take her Lovenox injection today since she has large hematoma on LLQ and takes chemo every two weeks that lowers her PLT count when she started having slurred speech and wasn't able to speak for about 4-5 seconds.  Per pt's husband she passed out but pt denies passing out at this time. Pt states that the cancer center RN recommend her come to ED to be evaluated. This episode took place at 0950 this am.

## 2013-01-21 NOTE — Consult Note (Signed)
[facility]  Telephone:([phone]) [phone]   ONCOLOGY  HOSPITAL CONSULTATION NOTE  Monique [name]                                MR#: [id number]  DOB: 11-17-1945                       CSN#: [id number]  Referring MD: [facility]  Consulting Physician Dr Darnelle Catalan Primary MD: Aura Dials, MD ([facility])/   Reason for Consult: CVA  NWG:NFAOZ Monique [name] is a 67 y.o. White woman patient of Dr. 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 Post first cycle gemzar on 12-02-12, cycle 2 gemzar by PICC given 12-30-12. Gemzar was then decreased due to thrombocytopenia on Cycle 2. D1C3 was received on 01/13/2013. Platelets were monitored carefully since. On 9/22 were 133 (today at 102k). 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  At the time of her visit on 9/22, there was suggestion of early thrombophlebitis left calf, for which  lovenox was resumed on that day. In addition, she had heparin flush on 9/29.  Being on day 8 of Lovenox, she called the Cancer Center after development left abdominal hematoma at the injection site with no other areas of bleeding, no hemoptysis, epistaxis or petechial rash. She reports that as she was calling our office, she noted speech changes, "slurred, slowed"to" a point she was unable to talk" although no confusion was noted. She presented to the ED on 9/30, as instructed by the Cancer Center nurse. On admission, she states that her acute symptoms resolved. CT of the head with no  contrast was essentially negative. However, MRI brain without contrast on 01/21/13 showed   continued development of small infarcts in the right peri-Rolandic cortex and right cerebellar hemisphere. Several small infarcts appear to be acute to early subacute. No associated mass effect or hemorrhage.. Otherwise regressed diffusion abnormality in the brain since 12/05/2012. Expected evolution of the previously-seen larger infarcts, including at the left frontal operculum and left cerebellar hemisphere.  Due to suspected embolic from hypercoagulable state from ovarian cancer and her nonischemic cardiomyopathy, Neurology was requested in consultation. She is to be transferred to [facility] for further management of her symptoms. IV heparin to be given at this time. Swallow and Speech evaluation pending upon her admission.  We have been kindly informed of the patient's admission to help with hematological issues.          PMH:  Past Medical History  Diagnosis Date  . Arthritis   . Acute venous embolism and thrombosis of unspecified deep vessels of lower extremity   . Bronchitis     several times  . Abdominal or pelvic swelling, mass or lump, unspecified site   . Phlebitis and thrombophlebitis of unspecified site   . Pelvic mass   . Skin cancer   . Ovarian cancer   . Anxiety   . GERD (gastroesophageal reflux disease)   .  Anemia   . PONV (postoperative nausea and vomiting)     "when fentanyl is used, will break out in itchy rast"  . Stroke     Surgeries:  Past Surgical History  Procedure Laterality Date  . Appendectomy  1966    ruptured, hospital for 3 weeks  . Rhinoplasty  1976  . Vein ligation and stripping  1998    right leg  . Mohs surgery  2004, 2005    basal cell of the face  . Tooth extraction    . Laparotomy N/A 09/10/2012    Procedure: EXPLORATORY LAPAROTOMY, BILATERAL SALPINGO OOPHORECTOMY, TUMOR DEBULKING;  Surgeon: Rejeana Brock A. Duard Brady, MD;  Location: [facility];  Service:  Gynecology;  Laterality: N/A;  . Exploratory laparotomy  09/10/12    Lysis of adhesions, BSO, suboptimal tumor debulking    Allergies:  Allergies  Allergen Reactions  . Septra [Sulfamethoxazole-Tmp Ds] Rash and Other (See Comments)    High fever, Burning skin  . Ampicillin Itching  . Fentanyl And Related Hives    Per Dr. Acey Lav, pt reports she is ok with fentanyl (09/10/12). Patient states it was epidural form of Fentanyl that she reacted to.   . Neosporin [Neomycin-Bacitracin Zn-Polymyx] Swelling and Other (See Comments)    opthalmic solution only can use topically. Swelling and inflamed eyes    Medications:  No current facility-administered medications for this encounter. Current outpatient prescriptions:lisinopril (PRINIVIL,ZESTRIL) 2.5 MG tablet, Take 1 tablet (2.5 mg total) by mouth daily., Disp: 30 tablet, Rfl: 1;  LORazepam (ATIVAN) 0.5 MG tablet, Take 0.5 mg by mouth every 8 (eight) hours as needed for anxiety (takes after Chemo treatment). , Disp: , Rfl: ;  metoprolol succinate (TOPROL-XL) 25 MG 24 hr tablet, Take 12.5 mg by mouth 2 (two) times daily., Disp: , Rfl:  oxyCODONE-acetaminophen (PERCOCET/ROXICET) 5-325 MG per tablet, Take 1 tablet by mouth every 4 (four) hours as needed for pain. , Disp: , Rfl: ;  pantoprazole (PROTONIX) 40 MG tablet, Take 1 tablet (40 mg total) by mouth daily., Disp: 30 tablet, Rfl: 1;  polyethylene glycol (MIRALAX / GLYCOLAX) packet, Take 17 g by mouth daily as needed (for constipation). , Disp: , Rfl:  sennosides-docusate sodium (SENOKOT-S) 8.6-50 MG tablet, Take 1-2 tablets by mouth daily as needed for constipation., Disp: , Rfl: ;  spironolactone (ALDACTONE) 25 MG tablet, Take 1 tablet (25 mg total) by mouth daily., Disp: 30 tablet, Rfl: 1;  enoxaparin (LOVENOX) 100 MG/ML injection, Inject 1 mL (100 mg total) into the skin daily., Disp: 30 Syringe, Rfl: 3     ROS: See HPI for significant positives. She denies gait disturbance, vision problems,  confusion, motor or sensory deficits. No respiratory or cardiac complaints. Rest of ROS is essentially negative.   Family History:    Family History  Problem Relation Age of Onset  . Pneumonia Mother   . Heart attack Father     No family history of bleeding disorders.  Social History:  reports that she quit smoking about 9 months ago. Her smoking use included Cigarettes. She smoked 0.00 packs per day for 30 years. She has never used smokeless tobacco. She reports that she does not drink alcohol or use illicit drugs.  Physical Exam    Filed Vitals:   01/21/13 1534  BP: 122/61  Pulse: 99  Temp:   Resp: 16     There were no vitals filed for this visit.  General:  89 -year-old  in no acute distress A. and O. x3  Well-developed, thin. HEENT: Normocephalic, atraumatic, PERRLA. Oral cavity without thrush or lesions. No gingival bleeding. Neck supple. no thyromegaly, no cervical or supraclavicular adenopathy  Lungs clear bilaterally . No wheezing, rhonchi or rales. No axillary masses. Breasts: not examined. Cardiac regular rate and rhythm,no murmur , rubs or gallops Abdomen soft nontender , bowel sounds x4. No HSM. No masses palpable. Left hematoma, 7-8 cm, with central induration, no other areas of hematoma throughout the abdomen and pelvis.  GU/rectal: deferred. Extremities no clubbing cyanosis or edema. No bruising or petechial rash Musculoskeletal: no spinal tenderness.  Neuro: Non Focal, other than chronic slight speech deficiency, no motor or sensory abnormalities. Follows simple and complex commands.   Labs:     Recent Labs Lab 01/21/13 1140 01/21/13 1147  WBC 4.6  --   HGB 9.4* 12.9  HCT 28.6* 38.0  PLT 102*  --   MCV 88.3  --   MCH 29.0  --   MCHC 32.9  --   RDW 16.5*  --   LYMPHSABS 0.9  --   MONOABS 0.6  --   EOSABS 0.0  --   BASOSABS 0.0  --          Recent Labs Lab 01/21/13 1140 01/21/13 1147  NA 135 139  K 3.9 4.0  CL 100 102  CO2 24  --     GLUCOSE 98 97  BUN 12 12  CREATININE 0.64 0.80  CALCIUM 9.6  --   AST 31  --   ALT 25  --   ALKPHOS 110  --   BILITOT 0.2*  --         Component Value Date/Time   BILITOT 0.2* 01/21/2013 1140   BILITOT 0.34 01/13/2013 1038      Recent Labs Lab 01/21/13 1140  INR 1.03    No results found for this basename: DDIMER,  in the last 72 hours   Anemia panel:  No results found for this basename: VITAMINB12, FOLATE, FERRITIN, TIBC, IRON, RETICCTPCT,  in the last 72 hours  Urinalysis    Component Value Date/Time   COLORURINE YELLOW 01/21/2013 1351   APPEARANCEUR CLEAR 01/21/2013 1351   LABSPEC 1.021 01/21/2013 1351   PHURINE 6.0 01/21/2013 1351   GLUCOSEU NEGATIVE 01/21/2013 1351   HGBUR TRACE* 01/21/2013 1351   BILIRUBINUR NEGATIVE 01/21/2013 1351   KETONESUR NEGATIVE 01/21/2013 1351   PROTEINUR NEGATIVE 01/21/2013 1351   UROBILINOGEN 0.2 01/21/2013 1351   NITRITE NEGATIVE 01/21/2013 1351   LEUKOCYTESUR NEGATIVE 01/21/2013 1351    Drugs of Abuse     Component Value Date/Time   LABOPIA NONE DETECTED 01/21/2013 1351   COCAINSCRNUR NONE DETECTED 01/21/2013 1351   LABBENZ NONE DETECTED 01/21/2013 1351   AMPHETMU NONE DETECTED 01/21/2013 1351   THCU NONE DETECTED 01/21/2013 1351   LABBARB NONE DETECTED 01/21/2013 1351     Imaging Studies:  Ct Head Wo Contrast  01/21/2013   CLINICAL DATA:  Slurred speech.  EXAM: CT HEAD WITHOUT CONTRAST  TECHNIQUE: Contiguous axial images were obtained from the base of the skull through the vertex without intravenous contrast.  COMPARISON:  CT 12/06/2012  FINDINGS: Generalized atrophy. Mild ventricular enlargement, stable.  Chronic infarct left cerebellum is unchanged. No acute infarct. Negative for hemorrhage or mass lesion.  IMPRESSION: No acute abnormality and no change from the prior CT.   Electronically Signed   By: Marlan Palau M.D.   On: 01/21/2013 12:38   Mr Brain Wo Contrast  01/21/2013   CLINICAL DATA:  67 year old female with sudden onset  of loss of speech. Recent stroke, last month. History of ovarian cancer.  EXAM: MRI HEAD WITHOUT CONTRAST  TECHNIQUE: Multiplanar, multisequence MR imaging was performed. No intravenous contrast was administered.  COMPARISON:  Head CT without contrast 01/21/2013. Brain MRI 12/05/2012.  FINDINGS: Major intracranial vascular flow voids are stable and within normal limits.  Regressed diffusion signal abnormality in the left hemisphere. No definite residual restricted diffusion. Developing gliosis/encephalomalacia at the frontal operculum (series 6, images 16 and 17).  Scattered small mostly cortically based areas of restricted diffusion in the superior right hemisphere have in part regressed, but could some new areas of involvement which do appear restricted are identified (series 3, image 24 and series 300, image 24) mild associated T2 and FLAIR hyperintensity. No associated mass effect or hemorrhage. No chronic blood products identified at the areas of previous insult.  No deep gray matter or brainstem and diffusion abnormality.  Regressed diffusion abnormality in the cerebellum. Evolved linear infarcts in the left cerebellum. In the right hemisphere there is a small linear area of diffusion abnormality which appears mildly restricted (series 3, image 9 and series 300, image 9). No associated mass effect or hemorrhage.  Stable ventricle size and configuration. No midline shift, mass effect, or evidence of intracranial mass lesion. No acute intracranial hemorrhage identified. Negative pituitary, cervicomedullary junction, and visualized cervical spine. Normal bone marrow signal. Stable cerebral volume.  Visualized orbit soft tissues are within normal limits. Visualized paranasal sinuses and mastoids are clear. Normal visible internal auditory structures. Negative scalp soft tissues.  IMPRESSION:  2. Otherwise regressed diffusion abnormality in the brain since 12/05/2012. Expected evolution of the previously-seen  larger infarcts, including at the left frontal operculum and left cerebellar hemisphere.   Electronically Signed   By: Augusto Gamble M.D.   On: 01/21/2013 13:43   Ir Fluoro Guide Cv Line Right  12/26/2012   *RADIOLOGY REPORT*  Clinical Data: 67 year old with ovarian cancer.  POWERED PICC LINE PLACEMENT WITH ULTRASOUND AND FLUOROSCOPIC GUIDANCE  Fluoroscopy Time: 24 seconds  The right arm was prepped with chlorhexidine, draped in the usual sterile fashion using maximum barrier technique (cap and mask, sterile gown, sterile gloves, large sterile sheet, hand hygiene and cutaneous antisepsis) and infiltrated locally with 1% Lidocaine.  Ultrasound demonstrated patency of the right brachial vein, and this was documented with an image.  Under real-time ultrasound guidance, this vein was accessed with a 21 gauge micropuncture needle and image documentation was performed.  The needle was exchanged over a guidewire for a peel-away sheath through which a 5 [place] single lumen PICC trimmed to 40 cm was advanced, positioned with its tip at the lower SVC/right atrial junction.  Fluoroscopy during the procedure and fluoro spot radiograph confirms appropriate catheter position.  The catheter was flushed, secured to the skin with Prolene sutures, and covered with a sterile dressing.  Complications:  None  IMPRESSION: Successful right arm poweredPICC line placement with ultrasound and fluoroscopic guidance.  The catheter is ready for use.   Original Report Authenticated By: Richarda Overlie, M.D.   Ir US Guide Vasc Access Right  12/26/2012   *RADIOLOGY REPORT*  Clinical Data: 67 year old with ovarian cancer.  POWERED PICC LINE PLACEMENT WITH ULTRASOUND AND FLUOROSCOPIC GUIDANCE  Fluoroscopy Time: 24 seconds  The right arm was prepped with chlorhexidine, draped in the usual sterile fashion using maximum barrier technique (cap and mask, sterile gown, sterile gloves, large sterile sheet, hand hygiene and cutaneous antisepsis) and infiltrated  locally  with 1% Lidocaine.  Ultrasound demonstrated patency of the right brachial vein, and this was documented with an image.  Under real-time ultrasound guidance, this vein was accessed with a 21 gauge micropuncture needle and image documentation was performed.  The needle was exchanged over a guidewire for a peel-away sheath through which a 5 [place] single lumen PICC trimmed to 40 cm was advanced, positioned with its tip at the lower SVC/right atrial junction.  Fluoroscopy during the procedure and fluoro spot radiograph confirms appropriate catheter position.  The catheter was flushed, secured to the skin with Prolene sutures, and covered with a sterile dressing.  Complications:  None  IMPRESSION: Successful right arm poweredPICC line placement with ultrasound and fluoroscopic guidance.  The catheter is ready for use.   Original Report Authenticated By: Richarda Overlie, M.D.      A/P: 67 y.o. female  Patient of Dr. Darrold Span, with a history of Advanced stage high grade serous bilateral ovarian carcinoma: post neoadjuvant dose dense taxol/ carboplatin x 3, suboptimal debulking 09-10-12, rising CA 125 after 2 additional post operative cycles of dose dense taxol/ [place] tho still minimal disease by CTs late July. Post first cycle gemzar on 12-02-12, cycle 3gemzar by PICC given 01-13-13.  2.History of acute embolic CVAs 12-04-12 while on xarelto, superficial thrombophlebitis while on coumadin, hx superficial and deep venous thromboses at diagnosis of gyn cancer: IVC filter in. Suggestion of early thrombophlebitis left calf  Prompted to resume lovenox on 9/22. While on Day 8 of Lovenox, she was admitted via ED with slurred speech and Left abdominal hematoma at the Lovenox site. Platelets currently 102. She is to receive heparin per pharmacy, after MRI  DemonstratedContinued development of small infarcts in the right peri-Rolandic cortex and right cerebellar hemisphere. Several small infarcts appear to be acute to early  subacute. No associated mass effect or hemorrhage. She is to be transferred to [facility] for continuation of care, Neurology to see. Hypercoagulable state in the setting of malignancy versus cardioembolic source is being addressed.   Dr.  Darnelle Catalan. For Dr. Darrold Span    is to see the patient following this consult with recommendations regarding diagnosis and  further workup studies. An addendum to this note is to be written.    Thank you for the referral.  Marcos Eke, PA-C 01/21/2013 3:45 PM  ADDENDUM: Concur with APP summary. In brief: this 67 y/o [place] woman presents with further evidence of thrombosis in the setting of lovenox therapy (interupted however because of thrombocytopenia). We will want to review exactly what doses the patient received when before deciding whether this is indeed a LMWH failure. We will also send a hypercoagulable panel. In the meantime recommend IV unfractionated heparin. Note her platelet count is not suggestive of heparing-induced-thrombocytopenia but this will have to be followed as well.  Will follow with you

## 2013-01-21 NOTE — ED Notes (Signed)
Patient transported to CT 

## 2013-01-21 NOTE — Telephone Encounter (Signed)
Patient called asking if she should take lovenox today.    During call, her speech became slurred to a point she was unable to talk.  After two minutes she spoke slurred and cleared with further attempts at speech.  This nurse instructed her to go to ER now for evaluation.  Reports she is home alone and someone will be returning in a few hours.  "Something happened, it was temporary and I'm okay now.  My face is equal and smile turned upward.  I'm thinking clearly and moving everything well."  Encouraged to call 911 for emergency transport and not to wait.        She was able to communicate that yesterday she administered lovenox injection at 3:00 pm and by 6:30 or 7:00 she noticed pain at the injection site with a hematoma.  It grew to the size of a quarter, she placed an ice pack on it and it stopped.  This morning a bruise is present.  Asked if she should hold this medicine until tomorrow's f/u visit with Dr. Darrold Span.  Instructed not worry about this but to be evaluated for the slurred speech.  Reports history of stroke and being at [facility] in August.  Asked that she call when en route to the [facility] ER.

## 2013-01-21 NOTE — ED Provider Notes (Signed)
CSN: [id number]     Arrival date & time 01/21/13  1057 History   First MD Initiated Contact with Patient 01/21/13 1059     Chief Complaint  Patient presents with  . Aphasia    HPI Patient presents to the emergency room with complaints of speech difficulty. Patient has a history of ovarian cancer as well as prior stroke. Patient states she had been getting Lovenox injections for phlebitis.  She stopped that medication previously because of a low platelet count but has been back on it for the last 8 days. Patient states she noticed a hematoma after her injection yesterday. She was actually on the phone with the oncology office to discuss whether she should have another injection when she started having difficulty with her speech. Patient states she initially had slurred speech and she wasn't able to speak for about 4-5 seconds.  Patient denies loss of consciousness. The patient's symptoms have all resolved at this time but she is instructed to come to the emergency room for further evaluation. Patient does admit that she felt like her left arm was weak during this spell as well. She does have a history of residual speech problems from her prior stroke but feels that she is at her baseline at this time. Past Medical History  Diagnosis Date  . Arthritis   . Acute venous embolism and thrombosis of unspecified deep vessels of lower extremity   . Bronchitis     several times  . Abdominal or pelvic swelling, mass or lump, unspecified site   . Phlebitis and thrombophlebitis of unspecified site   . Pelvic mass   . Skin cancer   . Ovarian cancer   . Anxiety   . GERD (gastroesophageal reflux disease)   . Anemia   . PONV (postoperative nausea and vomiting)     "when fentanyl is used, will break out in itchy rast"  . Stroke    Past Surgical History  Procedure Laterality Date  . Appendectomy  1966    ruptured, hospital for 3 weeks  . Rhinoplasty  1976  . Vein ligation and stripping  1998    right  leg  . Mohs surgery  2004, 2005    basal cell of the face  . Tooth extraction    . Laparotomy N/A 09/10/2012    Procedure: EXPLORATORY LAPAROTOMY, BILATERAL SALPINGO OOPHORECTOMY, TUMOR DEBULKING;  Surgeon: Rejeana Brock A. Duard Brady, MD;  Location: [facility];  Service: Gynecology;  Laterality: N/A;  . Exploratory laparotomy  09/10/12    Lysis of adhesions, BSO, suboptimal tumor debulking   Family History  Problem Relation Age of Onset  . Pneumonia Mother   . Heart attack Father    History  Substance Use Topics  . Smoking status: Former Smoker -- 30 years    Types: Cigarettes    Quit date: 04/20/2012  . Smokeless tobacco: Never Used     Comment: quit for a couple years then started back  . Alcohol Use: No   OB History   Grav Para Term Preterm Abortions TAB SAB Ect Mult Living                 Review of Systems  All other systems reviewed and are negative.    Allergies  Septra; Ampicillin; Fentanyl and related; and Neosporin  Home Medications   Current Outpatient Rx  Name  Route  Sig  Dispense  Refill  . lisinopril (PRINIVIL,ZESTRIL) 2.5 MG tablet   Oral   Take 1 tablet (2.5  mg total) by mouth daily.   30 tablet   1   . LORazepam (ATIVAN) 0.5 MG tablet   Oral   Take 0.5 mg by mouth every 8 (eight) hours as needed for anxiety (takes after Chemo treatment).          . metoprolol succinate (TOPROL-XL) 25 MG 24 hr tablet   Oral   Take 12.5 mg by mouth 2 (two) times daily.         Marland Kitchen oxyCODONE-acetaminophen (PERCOCET/ROXICET) 5-325 MG per tablet   Oral   Take 1 tablet by mouth every 4 (four) hours as needed for pain.          . pantoprazole (PROTONIX) 40 MG tablet   Oral   Take 1 tablet (40 mg total) by mouth daily.   30 tablet   1   . polyethylene glycol (MIRALAX / GLYCOLAX) packet   Oral   Take 17 g by mouth daily as needed (for constipation).          . sennosides-docusate sodium (SENOKOT-S) 8.6-50 MG tablet   Oral   Take 1-2 tablets by mouth daily as needed  for constipation.         Marland Kitchen spironolactone (ALDACTONE) 25 MG tablet   Oral   Take 1 tablet (25 mg total) by mouth daily.   30 tablet   1   . enoxaparin (LOVENOX) 100 MG/ML injection   Subcutaneous   Inject 1 mL (100 mg total) into the skin daily.   30 Syringe   3    BP 134/53  Pulse 85  Temp(Src) 98.8 F (37.1 C)  Resp 14  SpO2 100% Physical Exam  Nursing note and vitals reviewed. Constitutional: She is oriented to person, place, and time. She appears well-developed and well-nourished. No distress.  HENT:  Head: Normocephalic and atraumatic.  Right Ear: External ear normal.  Left Ear: External ear normal.  Mouth/Throat: Oropharynx is clear and moist.  Eyes: Conjunctivae are normal. Right eye exhibits no discharge. Left eye exhibits no discharge. No scleral icterus.  Neck: Neck supple. No tracheal deviation present.  Cardiovascular: Normal rate, regular rhythm and intact distal pulses.   Pulmonary/Chest: Effort normal and breath sounds normal. No stridor. No respiratory distress. She has no wheezes. She has no rales.  Abdominal: Soft. Bowel sounds are normal. She exhibits no distension. There is no tenderness. There is no rebound and no guarding.  Hematoma noted left lower abdominal wall  Musculoskeletal: She exhibits no edema and no tenderness.  Neurological: She is alert and oriented to person, place, and time. She has normal strength. No cranial nerve deficit ( no gross defecits noted) or sensory deficit. She exhibits normal muscle tone. She displays no seizure activity. Coordination normal.  No pronator drift bilateral upper extrem, able to hold both legs off bed for 5 seconds, sensation intact in all extremities, no visual field cuts, no left or right sided neglect, slight hesitation in her speech pattern  Skin: Skin is warm and dry. No rash noted.  Psychiatric: She has a normal mood and affect.    ED Course  Procedures (including critical care time) EKG Normal  sinus rhythm rate 77 Left axis deviation Nonspecific T wave abnormalities in the inferior leads Anterior infarct old  EKG is similar to prior EKG dated December 05 2012 Labs Review Labs Reviewed  CBC - Abnormal; Notable for the following:    RBC 3.24 (*)    Hemoglobin 9.4 (*)    HCT 28.6 (*)  RDW 16.5 (*)    Platelets 102 (*)    All other components within normal limits  DIFFERENTIAL - Abnormal; Notable for the following:    Monocytes Relative 13 (*)    All other components within normal limits  COMPREHENSIVE METABOLIC PANEL - Abnormal; Notable for the following:    Albumin 3.2 (*)    Total Bilirubin 0.2 (*)    All other components within normal limits  TROPONIN I - Abnormal; Notable for the following:    Troponin I 0.43 (*)    All other components within normal limits  POCT I-STAT TROPONIN I - Abnormal; Notable for the following:    Troponin i, poc 0.36 (*)    All other components within normal limits  ETHANOL  PROTIME-INR  APTT  URINE RAPID DRUG SCREEN (HOSP PERFORMED)  URINALYSIS, ROUTINE W REFLEX MICROSCOPIC  POCT I-STAT, CHEM 8   Imaging Review Ct Head Wo Contrast  01/21/2013   CLINICAL DATA:  Slurred speech.  EXAM: CT HEAD WITHOUT CONTRAST  TECHNIQUE: Contiguous axial images were obtained from the base of the skull through the vertex without intravenous contrast.  COMPARISON:  CT 12/06/2012  FINDINGS: Generalized atrophy. Mild ventricular enlargement, stable.  Chronic infarct left cerebellum is unchanged. No acute infarct. Negative for hemorrhage or mass lesion.  IMPRESSION: No acute abnormality and no change from the prior CT.   Electronically Signed   By: Marlan Palau M.D.   On: 01/21/2013 12:38   Mr Brain Wo Contrast  01/21/2013   CLINICAL DATA:  67 year old female with sudden onset of loss of speech. Recent stroke, last month. History of ovarian cancer.  EXAM: MRI HEAD WITHOUT CONTRAST  TECHNIQUE: Multiplanar, multisequence MR imaging was performed. No intravenous  contrast was administered.  COMPARISON:  Head CT without contrast 01/21/2013. Brain MRI 12/05/2012.  FINDINGS: Major intracranial vascular flow voids are stable and within normal limits.  Regressed diffusion signal abnormality in the left hemisphere. No definite residual restricted diffusion. Developing gliosis/encephalomalacia at the frontal operculum (series 6, images 16 and 17).  Scattered small mostly cortically based areas of restricted diffusion in the superior right hemisphere have in part regressed, but could some new areas of involvement which do appear restricted are identified (series 3, image 24 and series 300, image 24) mild associated T2 and FLAIR hyperintensity. No associated mass effect or hemorrhage. No chronic blood products identified at the areas of previous insult.  No deep gray matter or brainstem and diffusion abnormality.  Regressed diffusion abnormality in the cerebellum. Evolved linear infarcts in the left cerebellum. In the right hemisphere there is a small linear area of diffusion abnormality which appears mildly restricted (series 3, image 9 and series 300, image 9). No associated mass effect or hemorrhage.  Stable ventricle size and configuration. No midline shift, mass effect, or evidence of intracranial mass lesion. No acute intracranial hemorrhage identified. Negative pituitary, cervicomedullary junction, and visualized cervical spine. Normal bone marrow signal. Stable cerebral volume.  Visualized orbit soft tissues are within normal limits. Visualized paranasal sinuses and mastoids are clear. Normal visible internal auditory structures. Negative scalp soft tissues.  IMPRESSION: 1. Continued development of small infarcts in the right peri-Rolandic cortex and right cerebellar hemisphere. Several small infarcts appear to be acute to early subacute. No associated mass effect or hemorrhage.  2. Otherwise regressed diffusion abnormality in the brain since 12/05/2012. Expected evolution of  the previously-seen larger infarcts, including at the left frontal operculum and left cerebellar hemisphere.   Electronically Signed  By: Augusto Gamble M.D.   On: 01/21/2013 13:43    MDM   1. Stroke   2. Thrombocytopenia   3. Anemia    The patient's MRI unfortunately shows new strokes have developed since her most recent MRI last month.  I've spoken with Dr. Thad Ranger. The patient will need admission to [facility] for further evaluation. I will consult with the hospitalist service on call.  Patient's symptoms have completely resolved at this time.  She is not a candidate for TPA based on the mild severity of her symptoms at this time.    Celene Kras, MD 01/21/13 1420

## 2013-01-21 NOTE — ED Notes (Signed)
Pt returned from MRI °

## 2013-01-21 NOTE — H&P (Addendum)
[facility] History and Physical  ANJULIE DIPIERRO ZOX:[id number] DOB: 1946-03-11 DOA: 01/21/2013  Referring physician: EDP PCP: Aura Dials, MD  Specialists: Onc-Dr.Livesay, Neuro: Dr.Sethi, Cards dr.Smith  Chief Complaint: Dysarthria  HPI: Monique [name] is a 67 y.o. 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 In these last 2 weeks, she had an interruption of due to thrombocytopenia. Has been taking Lovenox milligrams subcutaneous daily for the last 8 days, yesterday couple hours after her shot, noticed a small swollen subcutaneous area in the left side of her abdomen, along with bruising concerning for hematoma. She called the cancer Center today and was on the phone with Dr. Precious Reel nurse when she started slurring her speech again and was unable to speak about 3-5 seconds, subsequently asked to come to the ER where an MRI scan noted continued development of small infarct in the right nares rolandic cortex and right cerebellar hemisphere. Neurology Dr. Thad Ranger consulted and [facility] consulted for further evaluation management.    Review of Systems: The patient denies anorexia, fevin all er, weight loss,, vision loss, decreased hearing, hoarseness, he chest pain, syncope, dyspnea on exertion, peripheral edema, balance deficits, hemoptysis, abdominal pain, melena, hematochezia, severe indigestion/heartburn, hematuria, incontinence, genital sores, muscle weakness, suspicious skin lesions, transient blindness, difficulty walking, depression, unusual weight change, abnormal bleeding, enlarged lymph nodes, angioedema, and breast masses.     Past Medical History  Diagnosis Date  . Arthritis   . Acute venous embolism and thrombosis of unspecified deep vessels of lower extremity   . Bronchitis     several times  . Abdominal or pelvic swelling, mass or lump, unspecified site   . Phlebitis and thrombophlebitis of unspecified site   . Pelvic mass   . Skin cancer   . Ovarian cancer   . Anxiety   . GERD (gastroesophageal reflux disease)   . Anemia   . PONV (postoperative nausea and vomiting)     "when fentanyl is used, will break out in itchy rast"  . Stroke    Past Surgical History  Procedure Laterality Date  . Appendectomy  1966    ruptured, hospital for 3 weeks  . Rhinoplasty  1976  . Vein ligation and stripping  1998    right leg  . Mohs surgery  2004, 2005    basal cell of the face  . Tooth extraction    . Laparotomy N/A 09/10/2012    Procedure: EXPLORATORY LAPAROTOMY, BILATERAL SALPINGO OOPHORECTOMY, TUMOR DEBULKING;  Surgeon: Rejeana Brock A. Duard Brady, MD;  Location: [facility];  Service: Gynecology;  Laterality: N/A;  . Exploratory laparotomy  09/10/12    Lysis of adhesions, BSO, suboptimal tumor debulking   Social History:  reports that she quit smoking about 9 months ago. Her smoking use included Cigarettes. She smoked 0.00 packs per day for 30 years. She has never used smokeless tobacco. She reports that she does not drink alcohol or use illicit drugs. Lives at home by herself  Allergies  Allergen Reactions  . Septra [Sulfamethoxazole-Tmp Ds] Rash and Other (See Comments)    High fever, Burning skin  . Ampicillin Itching  . Fentanyl And Related Hives    Per Dr. Acey Lav, pt reports  she is ok with fentanyl (09/10/12). Patient states it was epidural form of Fentanyl that she reacted to.   . Neosporin [Neomycin-Bacitracin Zn-Polymyx] Swelling and Other (See Comments)    opthalmic solution only can use topically. Swelling and inflamed eyes    Family History  Problem Relation Age of Onset  . Pneumonia Mother   .  Heart attack Father     Prior to Admission medications   Medication Sig Start Date End Date Taking? Authorizing Provider  lisinopril (PRINIVIL,ZESTRIL) 2.5 MG tablet Take 1 tablet (2.5 mg total) by mouth daily. 12/11/12  Yes Cathlyn Parsons, PA-C  LORazepam (ATIVAN) 0.5 MG tablet Take 0.5 mg by mouth every 8 (eight) hours as needed for anxiety (takes after Chemo treatment).    Yes Historical Provider, MD  metoprolol succinate (TOPROL-XL) 25 MG 24 hr tablet Take 12.5 mg by mouth 2 (two) times daily.   Yes Historical Provider, MD  oxyCODONE-acetaminophen (PERCOCET/ROXICET) 5-325 MG per tablet Take 1 tablet by mouth every 4 (four) hours as needed for pain.  09/13/12  Yes Historical Provider, MD  pantoprazole (PROTONIX) 40 MG tablet Take 1 tablet (40 mg total) by mouth daily. 08/20/12  Yes Lennis Buzzy Han, MD  polyethylene glycol (MIRALAX / GLYCOLAX) packet Take 17 g by mouth daily as needed (for constipation).    Yes Historical Provider, MD  sennosides-docusate sodium (SENOKOT-S) 8.6-50 MG tablet Take 1-2 tablets by mouth daily as needed for constipation.   Yes Historical Provider, MD  spironolactone (ALDACTONE) 25 MG tablet Take 1 tablet (25 mg total) by mouth daily. 12/11/12  Yes Cathlyn Parsons, PA-C  enoxaparin (LOVENOX) 100 MG/ML injection Inject 1 mL (100 mg total) into the skin daily. 01/06/13   Lennis Buzzy Han, MD   Physical Exam: Filed Vitals:   01/21/13 1400  BP: 134/53  Pulse: 85  Temp:   Resp: 14     General:  AAOx3, no distress  Mild intermittent dysarthria  HEENT: PERRLa, EOMI  Cardiovascular: S1S2/RRR  Respiratory: CTAB  Abdomen: soft, NT, small area 3x6cm area of bruising, ecchymoses   Skin: no rashes   Musculoskeletal: no edema   Psychiatric: appropriate mood and affect  Neurologic: mild intermittent dysarthria, Cranial nerves 2-12intact, motor 5/5, sensory intact, plantars withdrawal, reflexes 2plus  Labs on Admission:  Basic Metabolic Panel:  Recent Labs Lab  01/21/13 1140 01/21/13 1147  NA 135 139  K 3.9 4.0  CL 100 102  CO2 24  --   GLUCOSE 98 97  BUN 12 12  CREATININE 0.64 0.80  CALCIUM 9.6  --    Liver Function Tests:  Recent Labs Lab 01/21/13 1140  AST 31  ALT 25  ALKPHOS 110  BILITOT 0.2*  PROT 7.3  ALBUMIN 3.2*   No results found for this basename: LIPASE, AMYLASE,  in the last 168 hours No results found for this basename: AMMONIA,  in the last 168 hours CBC:  Recent Labs Lab 01/21/13 1140 01/21/13 1147  WBC 4.6  --   NEUTROABS 3.1  --   HGB 9.4* 12.9  HCT 28.6* 38.0  MCV 88.3  --   PLT 102*  --    Cardiac Enzymes:  Recent Labs Lab 01/21/13 1140  TROPONINI 0.43*    BNP (last 3 results)  Recent Labs  12/05/12 1417  PROBNP 1659.0*   CBG: No results found for this basename: GLUCAP,  in the last 168 hours  Radiological Exams on Admission: Ct Head Wo Contrast  01/21/2013   CLINICAL  DATA:  Slurred speech.  EXAM: CT HEAD WITHOUT CONTRAST  TECHNIQUE: Contiguous axial images were obtained from the base of the skull through the vertex without intravenous contrast.  COMPARISON:  CT 12/06/2012  FINDINGS: Generalized atrophy. Mild ventricular enlargement, stable.  Chronic infarct left cerebellum is unchanged. No acute infarct. Negative for hemorrhage or mass lesion.  IMPRESSION: No acute abnormality and no change from the prior CT.   Electronically Signed   By: Marlan Palau M.D.   On: 01/21/2013 12:38   Mr Brain Wo Contrast  01/21/2013   CLINICAL DATA:  66 year old female with sudden onset of loss of speech. Recent stroke, last month. History of ovarian cancer.  EXAM: MRI HEAD WITHOUT CONTRAST  TECHNIQUE: Multiplanar, multisequence MR imaging was performed. No intravenous contrast was administered.  COMPARISON:  Head CT without contrast 01/21/2013. Brain MRI 12/05/2012.  FINDINGS: Major intracranial vascular flow voids are stable and within normal limits.  Regressed diffusion signal abnormality in the left  hemisphere. No definite residual restricted diffusion. Developing gliosis/encephalomalacia at the frontal operculum (series 6, images 16 and 17).  Scattered small mostly cortically based areas of restricted diffusion in the superior right hemisphere have in part regressed, but could some new areas of involvement which do appear restricted are identified (series 3, image 24 and series 300, image 24) mild associated T2 and FLAIR hyperintensity. No associated mass effect or hemorrhage. No chronic blood products identified at the areas of previous insult.  No deep gray matter or brainstem and diffusion abnormality.  Regressed diffusion abnormality in the cerebellum. Evolved linear infarcts in the left cerebellum. In the right hemisphere there is a small linear area of diffusion abnormality which appears mildly restricted (series 3, image 9 and series 300, image 9). No associated mass effect or hemorrhage.  Stable ventricle size and configuration. No midline shift, mass effect, or evidence of intracranial mass lesion. No acute intracranial hemorrhage identified. Negative pituitary, cervicomedullary junction, and visualized cervical spine. Normal bone marrow signal. Stable cerebral volume.  Visualized orbit soft tissues are within normal limits. Visualized paranasal sinuses and mastoids are clear. Normal visible internal auditory structures. Negative scalp soft tissues.  IMPRESSION: 1. Continued development of small infarcts in the right peri-Rolandic cortex and right cerebellar hemisphere. Several small infarcts appear to be acute to early subacute. No associated mass effect or hemorrhage.  2. Otherwise regressed diffusion abnormality in the brain since 12/05/2012. Expected evolution of the previously-seen larger infarcts, including at the left frontal operculum and left cerebellar hemisphere.   Electronically Signed   By: Augusto Gamble M.D.   On: 01/21/2013 13:43    EKG: Independently reviewed. t wave sin inferior and  lateral leads unchanged from prior  Assessment/Plan  1. CVA: Suspected embolic from hypercoagulable state from ovarian cancer and her nonischemic cardiomyopathy, -Neurology consulted per EDP -She will be transferred to [facility]  -I called and requested Hematology consult, d/w Dr.Marrinat, recommended IV heparin at this time, await Neuro input. -I will defer choice of anticoagulation to Neurology and Oncology, she has a small subcutaneous hematoma and had hemorrhagic transformation of her CVA last time. -Since TTE/carotid duplex/MRA completed last admission, do not see value in repeating this. -does she need TEE -Swallow screen -Speech eval  2. Elevated troponin -may be due to CVA -denies any chest pain or dyspnea -monitor on tele, cycle cardiac enzymes -EKG unchanged from prior  3. Advanced stage ovarian cancer -followed by dr.Livesay, Oncology consulted  4. H/o DVT -h/o IVC filter -anticoagulation as discussed above  5. Small subcutaneous hematoma -monitor closely for now  6. NICM/EF of 15% -clinically compensated -continue metoprolol, hold lisinopril to allow for permissible HTN  Code Status: Full Code Family Communication: d/w friend and HCPoA Mr.Brooks at bedside Disposition Plan: Tx   Time spent:  [facility] Pager [phone]  If 7PM-7AM, please contact night-coverage [email or web address] Password [email or web address] 01/21/2013, 3:03 PM

## 2013-01-21 NOTE — ED Notes (Signed)
MD at bedside. 

## 2013-01-22 ENCOUNTER — Ambulatory Visit: Payer: Medicare Other | Admitting: Oncology

## 2013-01-22 ENCOUNTER — Other Ambulatory Visit: Payer: Medicare Other | Admitting: Lab

## 2013-01-22 ENCOUNTER — Other Ambulatory Visit: Payer: Self-pay

## 2013-01-22 DIAGNOSIS — I639 Cerebral infarction, unspecified: Secondary | ICD-10-CM | POA: Diagnosis present

## 2013-01-22 DIAGNOSIS — I5022 Chronic systolic (congestive) heart failure: Secondary | ICD-10-CM

## 2013-01-22 DIAGNOSIS — Z7901 Long term (current) use of anticoagulants: Secondary | ICD-10-CM

## 2013-01-22 DIAGNOSIS — I428 Other cardiomyopathies: Secondary | ICD-10-CM | POA: Diagnosis present

## 2013-01-22 DIAGNOSIS — R4789 Other speech disturbances: Secondary | ICD-10-CM

## 2013-01-22 DIAGNOSIS — C569 Malignant neoplasm of unspecified ovary: Secondary | ICD-10-CM

## 2013-01-22 DIAGNOSIS — I472 Ventricular tachycardia: Secondary | ICD-10-CM

## 2013-01-22 LAB — CBC
MCH: 29.7 pg (ref 26.0–34.0)
MCHC: 33.8 g/dL (ref 30.0–36.0)
Platelets: 91 10*3/uL — ABNORMAL LOW (ref 150–400)
RDW: 16.9 % — ABNORMAL HIGH (ref 11.5–15.5)

## 2013-01-22 LAB — FACTOR 5 LEIDEN

## 2013-01-22 LAB — PROTEIN S, TOTAL: Protein S Ag, Total: 117 % (ref 60–150)

## 2013-01-22 LAB — MAGNESIUM: Magnesium: 1.9 mg/dL (ref 1.5–2.5)

## 2013-01-22 LAB — LIPID PANEL
Cholesterol: 149 mg/dL (ref 0–200)
HDL: 35 mg/dL — ABNORMAL LOW (ref >39)
Total CHOL/HDL Ratio: 4.3 RATIO
VLDL: 24 mg/dL (ref 0–40)

## 2013-01-22 LAB — HOMOCYSTEINE: Homocysteine: 10.3 umol/L (ref 4.0–15.4)

## 2013-01-22 LAB — HEPARIN LEVEL (UNFRACTIONATED)
Heparin Unfractionated: 0.16 IU/mL — ABNORMAL LOW (ref 0.30–0.70)
Heparin Unfractionated: 0.15 IU/mL — ABNORMAL LOW (ref 0.30–0.70)

## 2013-01-22 LAB — CARDIOLIPIN ANTIBODIES, IGG, IGM, IGA
Anticardiolipin IgA: 0 APL U/mL — ABNORMAL LOW (ref <22)
Anticardiolipin IgG: 8 GPL U/mL — ABNORMAL LOW (ref <23)
Anticardiolipin IgM: 1 MPL U/mL — ABNORMAL LOW (ref <11)

## 2013-01-22 LAB — LUPUS ANTICOAGULANT PANEL
DRVVT: 36.3 secs (ref <42.9)
Lupus Anticoagulant: NOT DETECTED
PTT Lupus Anticoagulant: 35.2 secs (ref 28.0–43.0)

## 2013-01-22 LAB — HEMOGLOBIN A1C: Mean Plasma Glucose: 123 mg/dL — ABNORMAL HIGH (ref <117)

## 2013-01-22 LAB — TROPONIN I
Troponin I: 0.51 ng/mL (ref <0.30)
Troponin I: <0.3 ng/mL (ref <0.30)

## 2013-01-22 LAB — POTASSIUM: Potassium: 3.8 mEq/L (ref 3.5–5.1)

## 2013-01-22 MED ORDER — WARFARIN SODIUM 5 MG PO TABS
5.0000 mg | ORAL_TABLET | Freq: Once | ORAL | Status: AC
  Administered 2013-01-22: 5 mg via ORAL
  Filled 2013-01-22: qty 1

## 2013-01-22 MED ORDER — WARFARIN VIDEO: Freq: Once | Status: DC

## 2013-01-22 MED ORDER — LISINOPRIL 2.5 MG PO TABS
2.5000 mg | ORAL_TABLET | Freq: Every day | ORAL | Status: DC
  Administered 2013-01-22 – 2013-01-28 (×7): 2.5 mg via ORAL
  Filled 2013-01-22 (×8): qty 1

## 2013-01-22 MED ORDER — HEPARIN (PORCINE) IN NACL 100-0.45 UNIT/ML-% IJ SOLN
1400.0000 [IU]/h | INTRAMUSCULAR | Status: DC
  Administered 2013-01-22: 1050 [IU]/h via INTRAVENOUS
  Administered 2013-01-22 – 2013-01-23 (×2): 1250 [IU]/h via INTRAVENOUS
  Administered 2013-01-24 – 2013-01-26 (×4): 1500 [IU]/h via INTRAVENOUS
  Administered 2013-01-27: 1400 [IU]/h via INTRAVENOUS
  Filled 2013-01-22 (×12): qty 250

## 2013-01-22 MED ORDER — WARFARIN SODIUM 5 MG PO TABS
5.0000 mg | ORAL_TABLET | Freq: Once | ORAL | Status: AC
  Filled 2013-01-22: qty 1

## 2013-01-22 MED ORDER — SPIRONOLACTONE 25 MG PO TABS
25.0000 mg | ORAL_TABLET | Freq: Every day | ORAL | Status: DC
  Administered 2013-01-22 – 2013-01-28 (×7): 25 mg via ORAL
  Filled 2013-01-22 (×7): qty 1

## 2013-01-22 MED ORDER — PATIENT'S GUIDE TO USING COUMADIN BOOK
Freq: Once | Status: AC
  Administered 2013-01-22: 17:00:00
  Filled 2013-01-22: qty 1

## 2013-01-22 MED ORDER — WARFARIN - PHARMACIST DOSING INPATIENT
Freq: Every day | Status: DC
  Administered 2013-01-22 – 2013-01-26 (×3)

## 2013-01-22 NOTE — Progress Notes (Signed)
Pt had 16 runs of wide QRS. Pt denied any CP or SOB. V/S stable. NP on call made aware. Will cont to monitor pt.

## 2013-01-22 NOTE — Progress Notes (Signed)
UR Completed Karam Dunson Graves-Bigelow, RN,BSN [phone]  

## 2013-01-22 NOTE — Progress Notes (Signed)
INITIAL NUTRITION ASSESSMENT  DOCUMENTATION CODES Per approved criteria  -Not Applicable   INTERVENTION:  Magic Cup with lunch and supper daily. Each serving contains 290 kcals and 9 gm protein.  NUTRITION DIAGNOSIS: Increased nutrient needs related to chronic catabolic illness as evidenced by estimated nutrition needs.   Goal: Intake to meet >90% of estimated nutrition needs.  Monitor:  PO intake, labs, weight trend.  Reason for Assessment: MST=2  67 y.o. female  Admitting Dx: Dysarthria  ASSESSMENT: Patient with history of bilateral ovarian cancer. S/P recent embolic stroke on 8/14. She was on the phone with Dr. Precious Reel nurse 9/30 when she started slurring her speech again and was unable to speak about 3-5 seconds, subsequently asked to come to the ER where an MRI scan noted continued development of small infarct; suspected embolic from hypercoagulable state from ovarian cancer and her nonischemic cardiomyopathy.   Patient reports some weight loss, but unsure of amount. According to review of weights in EMR, she has lost 4% of her usual weight in the past 2 weeks. She says that she eats okay, but she does not eat huge amounts. She has tried and does not like Ensure and [employer] supplements. She does not want to try Ensure pudding, but agreed to try Magic Cup with meals.   Nutrition focused physical exam completed.  No muscle or subcutaneous fat depletion noticed. Patient is at nutrition risk, given recent 4% weight loss.   Height: Ht Readings from Last 1 Encounters:  01/21/13 5\' 7"  (1.702 m)    Weight: Wt Readings from Last 1 Encounters:  01/21/13 137 lb 12.8 oz (62.506 kg)    Ideal Body Weight: 61.4 kg  % Ideal Body Weight: 102%  Wt Readings from Last 10 Encounters:  01/21/13 137 lb 12.8 oz (62.506 kg)  01/13/13 137 lb 12.8 oz (62.506 kg)  01/06/13 137 lb 6.4 oz (62.324 kg)  12/25/12 143 lb 14.4 oz (65.273 kg)  12/16/12 143 lb 7 oz (65.063 kg)   12/12/12 145 lb 12.8 oz (66.134 kg)  12/09/12 158 lb 6.4 oz (71.85 kg)  11/28/12 149 lb 12.8 oz (67.949 kg)  11/07/12 148 lb 6.4 oz (67.314 kg)  10/15/12 143 lb (64.864 kg)    Usual Body Weight: 143 lb  % Usual Body Weight: 96%  BMI:  Body mass index is 21.58 kg/(m^2).  Estimated Nutritional Needs: Kcal: 1550-1750 Protein: 90-100 gm Fluid: 1.6-1.8 L  Skin: no wounds  Diet Order: General  50% completion of breakfast today.  EDUCATION NEEDS: -Education not appropriate at this time   Intake/Output Summary (Last 24 hours) at 01/22/13 1103 Last data filed at 01/22/13 1000  Gross per 24 hour  Intake    240 ml  Output      0 ml  Net    240 ml    Last BM: 9/30   Labs:   Recent Labs Lab 01/21/13 1140 01/21/13 1147  NA 135 139  K 3.9 4.0  CL 100 102  CO2 24  --   BUN 12 12  CREATININE 0.64 0.80  CALCIUM 9.6  --   GLUCOSE 98 97    CBG (last 3)  No results found for this basename: GLUCAP,  in the last 72 hours  Scheduled Meds: . metoprolol succinate  12.5 mg Oral BID  . pantoprazole  40 mg Oral Daily    Continuous Infusions: . heparin 1,050 Units/hr (01/22/13 1051)    Past Medical History  Diagnosis Date  . Acute venous embolism  and thrombosis of unspecified deep vessels of lower extremity   . Bronchitis     several times  . Abdominal or pelvic swelling, mass or lump, unspecified site   . Phlebitis and thrombophlebitis of unspecified site   . Pelvic mass   . GERD (gastroesophageal reflux disease)   . PONV (postoperative nausea and vomiting)     "when fentanyl is used, will break out in itchy rast"  . Basal cell carcinoma of face   . Ovarian cancer   . CHF (congestive heart failure)     "just since 11/2012; they think it's from the chemo" (01/21/2013)  . 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 DVT (deep venous thrombosis) 04/2012    "BLE" (01/21/2013)  . Chronic bronchitis     "I had it several  times; I used to smoke" (01/21/2013)  . Shortness of breath     "trouble taking a deep breath at times before RX started; I'm fine now" (01/21/2013)  . Anemia     "because of the chemo" (01/21/2013)  . Arthritis     "little in my right hand" (01/21/2013)  . Anxiety     "related to ovarian cancer; don't take RX for it" (01/21/2013)    Past Surgical History  Procedure Laterality Date  . Appendectomy  1966    ruptured, hospital for 3 weeks  . Rhinoplasty  1976  . Vein ligation and stripping Right 1998    leg  . Mohs surgery  2004, 2005    basal cell of the face  . Tooth extraction  03/2011?    "just one; had a root canal that got infected" (01/21/2013)  . Laparotomy N/A 09/10/2012    Procedure: EXPLORATORY LAPAROTOMY, BILATERAL SALPINGO OOPHORECTOMY, TUMOR DEBULKING;  Surgeon: Rejeana Brock A. Duard Brady, MD;  Location: [facility];  Service: Gynecology;  Laterality: N/A;  . Exploratory laparotomy  09/10/12    Lysis of adhesions, BSO, suboptimal tumor debulking  . Vena cava filter placement  08/2012    Joaquin Courts, RD, LDN, CNSC Pager [phone] After Hours Pager [phone]

## 2013-01-22 NOTE — Progress Notes (Signed)
Chart reviewed.  [facility] PROGRESS NOTE  Monique [name] GNF:[id number] DOB: 1945-06-24 DOA: 01/21/2013 PCP: Aura Dials, MD  Assessment/Plan:  Acute ischemic stroke related to hypercoaguable state.  onc and neuro rec warfarin and iv heparin   Ovarian cancer, advanced.  Last chemo 9/22   Hypercoagulopathy   Thrombocytopenia, unspecified:  HIT panel last admission negative   NICM (nonischemic cardiomyopathy), likely chemo related   History of DVT (deep vein thrombosis) on anticoagulation and post IVC filter   Chronic systolic heart failure:  Resume home meds   NSVT (nonsustained ventricular tachycardia): check lytes, asymptomatic. Had last admission as well. On beta blocker. Minimally elevated troponin.  Appears chronic.  No chest pain. No evidence of ACS.  No further w/u  HPI/Subjective: Speech slightly improved.  No new symptoms  Objective: Filed Vitals:   01/22/13 1200  BP: 93/46  Pulse: 67  Temp: 98.3 F (36.8 C)  Resp: 20    Intake/Output Summary (Last 24 hours) at 01/22/13 1544 Last data filed at 01/22/13 1000  Gross per 24 hour  Intake    240 ml  Output      0 ml  Net    240 ml   Filed Weights   01/21/13 2100  Weight: 62.506 kg (137 lb 12.8 oz)    Exam:   General:  Alert, oriented  Cardiovascular: RRR without MGR  Respiratory: CTA without WRR  Abdomen: S, NT, ND  Ext: no CCE  Neuro:  Speech slightly thick, but fluent.  strenght 5/5  Data Reviewed: Basic Metabolic Panel:  Recent Labs Lab 01/21/13 1140 01/21/13 1147  NA 135 139  K 3.9 4.0  CL 100 102  CO2 24  --   GLUCOSE 98 97  BUN 12 12  CREATININE 0.64 0.80  CALCIUM 9.6  --    Liver Function Tests:  Recent Labs Lab 01/21/13 1140  AST 31  ALT 25  ALKPHOS 110  BILITOT 0.2*  PROT 7.3  ALBUMIN 3.2*   No results found for this basename: LIPASE, AMYLASE,  in the last 168 hours No results found for this basename: AMMONIA,  in the last 168 hours CBC:  Recent Labs Lab  01/21/13 1140 01/21/13 1147 01/22/13 0755  WBC 4.6  --  5.2  NEUTROABS 3.1  --   --   HGB 9.4* 12.9 9.8*  HCT 28.6* 38.0 29.0*  MCV 88.3  --  87.9  PLT 102*  --  91*   Cardiac Enzymes:  Recent Labs Lab 01/21/13 1140 01/21/13 1928 01/22/13 0138 01/22/13 0755  TROPONINI 0.43* 0.41* 0.51* <0.30   BNP (last 3 results)  Recent Labs  12/05/12 1417  PROBNP 1659.0*   CBG: No results found for this basename: GLUCAP,  in the last 168 hours  No results found for this or any previous visit (from the past 240 hour(s)).   Studies: Ct Head Wo Contrast  01/21/2013   CLINICAL DATA:  Slurred speech.  EXAM: CT HEAD WITHOUT CONTRAST  TECHNIQUE: Contiguous axial images were obtained from the base of the skull through the vertex without intravenous contrast.  COMPARISON:  CT 12/06/2012  FINDINGS: Generalized atrophy. Mild ventricular enlargement, stable.  Chronic infarct left cerebellum is unchanged. No acute infarct. Negative for hemorrhage or mass lesion.  IMPRESSION: No acute abnormality and no change from the prior CT.   Electronically Signed   By: Marlan Palau M.D.   On: 01/21/2013 12:38   Mr Brain Wo Contrast  01/21/2013   CLINICAL DATA:  67 year old female with sudden onset of loss of speech. Recent stroke, last month. History of ovarian cancer.  EXAM: MRI HEAD WITHOUT CONTRAST  TECHNIQUE: Multiplanar, multisequence MR imaging was performed. No intravenous contrast was administered.  COMPARISON:  Head CT without contrast 01/21/2013. Brain MRI 12/05/2012.  FINDINGS: Major intracranial vascular flow voids are stable and within normal limits.  Regressed diffusion signal abnormality in the left hemisphere. No definite residual restricted diffusion. Developing gliosis/encephalomalacia at the frontal operculum (series 6, images 16 and 17).  Scattered small mostly cortically based areas of restricted diffusion in the superior right hemisphere have in part regressed, but could some new areas of  involvement which do appear restricted are identified (series 3, image 24 and series 300, image 24) mild associated T2 and FLAIR hyperintensity. No associated mass effect or hemorrhage. No chronic blood products identified at the areas of previous insult.  No deep gray matter or brainstem and diffusion abnormality.  Regressed diffusion abnormality in the cerebellum. Evolved linear infarcts in the left cerebellum. In the right hemisphere there is a small linear area of diffusion abnormality which appears mildly restricted (series 3, image 9 and series 300, image 9). No associated mass effect or hemorrhage.  Stable ventricle size and configuration. No midline shift, mass effect, or evidence of intracranial mass lesion. No acute intracranial hemorrhage identified. Negative pituitary, cervicomedullary junction, and visualized cervical spine. Normal bone marrow signal. Stable cerebral volume.  Visualized orbit soft tissues are within normal limits. Visualized paranasal sinuses and mastoids are clear. Normal visible internal auditory structures. Negative scalp soft tissues.  IMPRESSION: 1. Continued development of small infarcts in the right peri-Rolandic cortex and right cerebellar hemisphere. Several small infarcts appear to be acute to early subacute. No associated mass effect or hemorrhage.  2. Otherwise regressed diffusion abnormality in the brain since 12/05/2012. Expected evolution of the previously-seen larger infarcts, including at the left frontal operculum and left cerebellar hemisphere.   Electronically Signed   By: Augusto Gamble M.D.   On: 01/21/2013 13:43    Scheduled Meds: . lisinopril  2.5 mg Oral Daily  . metoprolol succinate  12.5 mg Oral BID  . pantoprazole  40 mg Oral Daily  . spironolactone  25 mg Oral Daily   Continuous Infusions: . heparin 1,050 Units/hr (01/22/13 1051)    35 min  Monique [name]  [facility] Pager [phone]. If 7PM-7AM, please contact night-coverage at  [email or web address], password [facility] 01/22/2013, 3:44 PM  LOS: 1 day

## 2013-01-22 NOTE — Progress Notes (Signed)
ANTICOAGULATION CONSULT NOTE - Follow Up Consult  Pharmacy Consult for Heparin / Coumadin Indication: acute stroke  Allergies  Allergen Reactions  . Septra [Sulfamethoxazole-Tmp Ds] Rash and Other (See Comments)    High fever, Burning skin  . Ampicillin Itching  . Fentanyl And Related Hives    Per Dr. Acey Lav, pt reports she is ok with fentanyl (09/10/12). Patient states it was epidural form of Fentanyl that she reacted to.   . Neosporin [Neomycin-Bacitracin Zn-Polymyx] Swelling and Other (See Comments)    opthalmic solution only can use topically. Swelling and inflamed eyes   Labs:  Recent Labs  01/21/13 1140 01/21/13 1147 01/21/13 1928 01/22/13 0138 01/22/13 0755 01/22/13 1600  HGB 9.4* 12.9  --   --  9.8*  --   HCT 28.6* 38.0  --   --  29.0*  --   PLT 102*  --   --   --  91*  --   APTT 29  --   --   --   --   --   LABPROT 13.3  --   --   --   --   --   INR 1.03  --   --   --   --   --   HEPARINUNFRC  --   --   --   --  0.16* 0.15*  CREATININE 0.64 0.80  --   --   --   --   TROPONINI 0.43*  --  0.41* 0.51* <0.30  --     Estimated Creatinine Clearance: 68.2 ml/min (by C-G formula based on Cr of 0.8).  Assessment: 66yof with history of ovarian CA, previous stroke 8/14, DVT s/p IVC filter was admitted with an acute embolic stroke despite compliance with lovenox pta. She was transitioned to IV heparin yesterday evening.   Heparin level still low, Coumadin started this PM  Hgb has dropped 12.9>>9.8 and platelets 102>>91 (though appears she has a history of thrombocytopenia)  No bleeding reported.  Goal of Therapy:  Heparin level 0.3-0.5 units/ml Monitor platelets by anticoagulation protocol: Yes INR = 2 to 3   Plan:  1) Increase heparin to 1250 units/hr 2) 8 hour heparin level after change 3) Coumadin 5 mg po x 1 tonight  Thank you. Okey Regal, PharmD [phone]

## 2013-01-22 NOTE — Progress Notes (Signed)
OT Cancellation Note  Patient Details Name: Monique [name] MRN: [id number] DOB: 1945/09/15   Cancelled Treatment:    Reason Eval/Treat Not Completed: OT screened, no needs identified, will sign off.  Pt is at baseline level with mobility and ADLs (independent).   01/22/2013 Cipriano Mile OTR/L Pager [phone] Office [phone]

## 2013-01-22 NOTE — Evaluation (Signed)
Physical Therapy Evaluation Patient Details Name: Monique [name] MRN: [id number] DOB: 31-Mar-1946 Today's Date: 01/22/2013 Time: 0850-0903 PT Time Calculation (min): 13 min  PT Assessment / Plan / Recommendation History of Present Illness  Monique [name] is an 67 y.o. female with a past medical history significant for ovarian cancer currently undergoing chemotherapy with gemcitabine, newly diagnosed systolic heart failure with EF 15%, chronic anticoagulation on xarelto due to bilateral DVT s/p IVC filter placement, admitted to [facility] with new onset dysarthria. MRI-DWI disclosed scattered. small acute non hemorrhagic infarcts involving portions of the left sub insular region, left frontal lobe, left parietal lobe, occipital lobe bilaterally and cerebellum bilaterally.     Clinical Impression  Pt. Presented to ED with transient slurred speech.  MRI revealed multiple small infarcts.  Pt. Currently at baseline for mobility with no skilled PT intervention needed.    PT Assessment  Patent does not need any further PT services    Follow Up Recommendations  No PT follow up    Does the patient have the potential to tolerate intense rehabilitation      Barriers to Discharge        Equipment Recommendations  None recommended by PT    Recommendations for Other Services     Frequency      Precautions / Restrictions Precautions Precautions: None Restrictions Weight Bearing Restrictions: No   Pertinent Vitals/Pain Pt. denied      Mobility  Bed Mobility Bed Mobility: Sit to Supine;Supine to Sit Supine to Sit: 6: Modified independent (Device/Increase time);HOB elevated Sit to Supine: 6: Modified independent (Device/Increase time);HOB elevated Details for Bed Mobility Assistance: pt. performed bed mobility prior to PT lowering bed as visitor entered room and pt. up to see visitor Transfers Transfers: Sit to Stand;Stand to Sit Sit to Stand: 7: Independent;From bed Stand to Sit: 7: Independent;To  bed Ambulation/Gait Ambulation/Gait Assistance: 7: Independent Ambulation Distance (Feet): 200 Feet Assistive device: None Ambulation/Gait Assistance Details: WNL Gait Pattern: Within Functional Limits Gait velocity: WNL Stairs: No Wheelchair Mobility Wheelchair Mobility: No Modified Rankin (Stroke Patients Only) Pre-Morbid Rankin Score: No significant disability Modified Rankin: No significant disability    Exercises     PT Diagnosis:    PT Problem List:   PT Treatment Interventions:       PT Goals(Current goals can be found in the care plan section) Acute Rehab PT Goals Patient Stated Goal: to go home PT Goal Formulation: No goals set, d/c therapy  Visit Information  Last PT Received On: 01/22/13 Assistance Needed: +1 History of Present Illness: Monique [name] is an 67 y.o. female with a past medical history significant for ovarian cancer currently undergoing chemotherapy with gemcitabine, newly diagnosed systolic heart failure with EF 15%, chronic anticoagulation on xarelto due to bilateral DVT s/p IVC filter placement, admitted to [facility] with new onset dysarthria. MRI-DWI disclosed scattered. small acute non hemorrhagic infarcts involving portions of the left sub insular region, left frontal lobe, left parietal lobe, occipital lobe bilaterally and cerebellum bilaterally.          Prior Functioning  Home Living Family/patient expects to be discharged to:: Private residence Living Arrangements: Spouse/significant other Available Help at Discharge: Family Type of Home: House Home Access: Ramped entrance Home Layout: One level Home Equipment: None Prior Function Level of Independence: Independent Communication Communication: Expressive difficulties (mild dysarthria, expressive difficulties) Dominant Hand: Right    Cognition  Cognition Arousal/Alertness: Awake/alert Behavior During Therapy: WFL for tasks assessed/performed Overall Cognitive Status: Within Functional  Limits for tasks assessed    Extremity/Trunk Assessment Upper Extremity Assessment Upper Extremity Assessment: Defer to OT evaluation Lower Extremity Assessment Lower Extremity Assessment: Overall WFL for tasks assessed Cervical / Trunk Assessment Cervical / Trunk Assessment: Normal   Balance Dynamic Standing Balance Dynamic Standing - Balance Support: No upper extremity supported;During functional activity Dynamic Standing - Level of Assistance: 7: Independent Dynamic Standing - Balance Activities: Other (comment) (head turns with mobility) High Level Balance High Level Balance Activites: Turns;Sudden stops;Head turns  End of Session PT - End of Session Activity Tolerance: Patient tolerated treatment well Patient left: in bed;with call bell/phone within reach;with family/visitor present Nurse Communication: Mobility status  GP     Moshe Cipro K 01/22/2013, 9:14 AM   Clarita Crane, PT, DPT [phone]

## 2013-01-22 NOTE — Progress Notes (Signed)
CRITICAL VALUE ALERT  Critical value received:  troponin  Date of notification:  01/21/13  Time of notification:  2050  Critical value read back:yes  Nurse who received alert: Revonda Standard  MD notified (1st page):  Craige Cotta np  Time of first page:2100  MD notified (2nd page):  Time of second page:  Responding MD: Craige Cotta  Time MD responded:  2105

## 2013-01-22 NOTE — Progress Notes (Addendum)
ANTICOAGULATION CONSULT NOTE - Follow Up Consult  Pharmacy Consult for Heparin Indication: acute stroke  Allergies  Allergen Reactions  . Septra [Sulfamethoxazole-Tmp Ds] Rash and Other (See Comments)    High fever, Burning skin  . Ampicillin Itching  . Fentanyl And Related Hives    Per Dr. Acey Lav, pt reports she is ok with fentanyl (09/10/12). Patient states it was epidural form of Fentanyl that she reacted to.   . Neosporin [Neomycin-Bacitracin Zn-Polymyx] Swelling and Other (See Comments)    opthalmic solution only can use topically. Swelling and inflamed eyes    Patient Measurements: Height: 5\' 7"  (170.2 cm) Weight: 137 lb 12.8 oz (62.506 kg) IBW/kg (Calculated) : 61.6 Heparin Dosing Weight: 62kg  Vital Signs: Temp: 98.6 F (37 C) (10/01 0600) Temp src: Oral (10/01 0600) BP: 97/64 mmHg (10/01 0600) Pulse Rate: 66 (10/01 0600)  Labs:  Recent Labs  01/21/13 1140 01/21/13 1147 01/21/13 1928 01/22/13 0138 01/22/13 0755  HGB 9.4* 12.9  --   --  9.8*  HCT 28.6* 38.0  --   --  29.0*  PLT 102*  --   --   --  91*  APTT 29  --   --   --   --   LABPROT 13.3  --   --   --   --   INR 1.03  --   --   --   --   HEPARINUNFRC  --   --   --   --  0.16*  CREATININE 0.64 0.80  --   --   --   TROPONINI 0.43*  --  0.41* 0.51* <0.30    Estimated Creatinine Clearance: 68.2 ml/min (by C-G formula based on Cr of 0.8).   Medications:  Heparin @ 850 units/hr  Assessment: 66yof with history of ovarian CA, previous stroke 8/14, DVT s/p IVC filter was admitted with an acute embolic stroke despite compliance with lovenox pta. She was transitioned to IV heparin yesterday evening. Initial heparin level was low at 0.16.   Hgb has dropped 12.9>>9.8 and platelets 102>>91 (though appears she has a history of thrombocytopenia)  No bleeding reported.  Goal of Therapy:  Heparin level 0.3-0.5 units/ml Monitor platelets by anticoagulation protocol: Yes   Plan:  1) Increase heparin to  1050 units/hr 2) 6 hour heparin level after change 3) Continue to follow CBC closely 4) Follow up transition to coumadin  Fredrik Rigger 01/22/2013,10:17 AM  01/22/13 1600 pm Added Coumadin  Plan: 1) Coumadin 5 mg po x 1 2) Daily INR  Thank you. Okey Regal, PharmD

## 2013-01-22 NOTE — Progress Notes (Signed)
Monique [name]   DOB:[birth year]   JX#:[id number]   OZH#:[id number]  Subjective: uneventful night, no further speech slurring, focal weakness or other CNS symptoms; ROS unremarkable; no family in room   Objective: middle aged white evaluated in bed Filed Vitals:   01/22/13 0600  BP: 97/64  Pulse: 66  Temp: 98.6 F (37 C)  Resp: 18    Body mass index is 21.58 kg/(m^2). No intake or output data in the 24 hours ending 01/22/13 0732  Full exam not repeated; speech clear, neuro nonfocal  CBG (last 3)  No results found for this basename: GLUCAP,  in the last 72 hours   Labs:  Lab Results  Component Value Date   WBC 4.6 01/21/2013   HGB 12.9 01/21/2013   HCT 38.0 01/21/2013   MCV 88.3 01/21/2013   PLT 102* 01/21/2013   NEUTROABS 3.1 01/21/2013    @LASTCHEMISTRY @  Urine Studies No results found for this basename: UACOL, UAPR, USPG, UPH, UTP, UGL, UKET, UBIL, UHGB, UNIT, UROB, ULEU, UEPI, UWBC, URBC, UBAC, CAST, CRYS, UCOM, BILUA,  in the last 72 hours  Basic Metabolic Panel:  Recent Labs Lab 01/21/13 1140 01/21/13 1147  NA 135 139  K 3.9 4.0  CL 100 102  CO2 24  --   GLUCOSE 98 97  BUN 12 12  CREATININE 0.64 0.80  CALCIUM 9.6  --    GFR Estimated Creatinine Clearance: 68.2 ml/min (by C-G formula based on Cr of 0.8). Liver Function Tests:  Recent Labs Lab 01/21/13 1140  AST 31  ALT 25  ALKPHOS 110  BILITOT 0.2*  PROT 7.3  ALBUMIN 3.2*   No results found for this basename: LIPASE, AMYLASE,  in the last 168 hours No results found for this basename: AMMONIA,  in the last 168 hours Coagulation profile  Recent Labs Lab 01/21/13 1140  INR 1.03    CBC:  Recent Labs Lab 01/21/13 1140 01/21/13 1147  WBC 4.6  --   NEUTROABS 3.1  --   HGB 9.4* 12.9  HCT 28.6* 38.0  MCV 88.3  --   PLT 102*  --    Cardiac Enzymes:  Recent Labs Lab 01/21/13 1140 01/21/13 1928 01/22/13 0138  TROPONINI 0.43* 0.41* 0.51*   BNP: No components found with this basename:  POCBNP,  CBG: No results found for this basename: GLUCAP,  in the last 168 hours D-Dimer No results found for this basename: DDIMER,  in the last 72 hours Hgb A1c No results found for this basename: HGBA1C,  in the last 72 hours Lipid Profile No results found for this basename: CHOL, HDL, LDLCALC, TRIG, CHOLHDL, LDLDIRECT,  in the last 72 hours Thyroid function studies No results found for this basename: TSH, T4TOTAL, FREET3, T3FREE, THYROIDAB,  in the last 72 hours Anemia work up No results found for this basename: VITAMINB12, FOLATE, FERRITIN, TIBC, IRON, RETICCTPCT,  in the last 72 hours Microbiology No results found for this or any previous visit (from the past 240 hour(s)).    Studies:  Ct Head Wo Contrast  01/21/2013   CLINICAL DATA:  Slurred speech.  EXAM: CT HEAD WITHOUT CONTRAST  TECHNIQUE: Contiguous axial images were obtained from the base of the skull through the vertex without intravenous contrast.  COMPARISON:  CT 12/06/2012  FINDINGS: Generalized atrophy. Mild ventricular enlargement, stable.  Chronic infarct left cerebellum is unchanged. No acute infarct. Negative for hemorrhage or mass lesion.  IMPRESSION: No acute abnormality and no change from the prior CT.  Electronically Signed   By: Marlan Palau M.D.   On: 01/21/2013 12:38   Mr Brain Wo Contrast  01/21/2013   CLINICAL DATA:  67 year old female with sudden onset of loss of speech. Recent stroke, last month. History of ovarian cancer.  EXAM: MRI HEAD WITHOUT CONTRAST  TECHNIQUE: Multiplanar, multisequence MR imaging was performed. No intravenous contrast was administered.  COMPARISON:  Head CT without contrast 01/21/2013. Brain MRI 12/05/2012.  FINDINGS: Major intracranial vascular flow voids are stable and within normal limits.  Regressed diffusion signal abnormality in the left hemisphere. No definite residual restricted diffusion. Developing gliosis/encephalomalacia at the frontal operculum (series 6, images 16 and  17).  Scattered small mostly cortically based areas of restricted diffusion in the superior right hemisphere have in part regressed, but could some new areas of involvement which do appear restricted are identified (series 3, image 24 and series 300, image 24) mild associated T2 and FLAIR hyperintensity. No associated mass effect or hemorrhage. No chronic blood products identified at the areas of previous insult.  No deep gray matter or brainstem and diffusion abnormality.  Regressed diffusion abnormality in the cerebellum. Evolved linear infarcts in the left cerebellum. In the right hemisphere there is a small linear area of diffusion abnormality which appears mildly restricted (series 3, image 9 and series 300, image 9). No associated mass effect or hemorrhage.  Stable ventricle size and configuration. No midline shift, mass effect, or evidence of intracranial mass lesion. No acute intracranial hemorrhage identified. Negative pituitary, cervicomedullary junction, and visualized cervical spine. Normal bone marrow signal. Stable cerebral volume.  Visualized orbit soft tissues are within normal limits. Visualized paranasal sinuses and mastoids are clear. Normal visible internal auditory structures. Negative scalp soft tissues.  IMPRESSION: 1. Continued development of small infarcts in the right peri-Rolandic cortex and right cerebellar hemisphere. Several small infarcts appear to be acute to early subacute. No associated mass effect or hemorrhage.  2. Otherwise regressed diffusion abnormality in the brain since 12/05/2012. Expected evolution of the previously-seen larger infarcts, including at the left frontal operculum and left cerebellar hemisphere.   Electronically Signed   By: Augusto Gamble M.D.   On: 01/21/2013 13:43    Assessment: 67 y.o. [place] woman with hypercoagulable state possibly related to ovarian cancer or its treatment, history of CHF with EF 15%, hypercoagulable panel pending, admitted with  transient CVA symptoms, brain MRI  showing several acute/ subacute infarcts with patient on lovenox   Plan: I reviewed anticoagulation history with the patient. She was started on Xarelto for superficial thrombophlebitis, switched to coumadin apparently for cost considerations, and to lovenox because of the documented superiority of lovenox vs coumadin in the setting of uncontrolled cancer. The patient tells me she had been on lovenox 8 days before this episode and had not missed any doses.  Given all the above my recommendation would be to re-coumadinize under cover of IV heparin and consider adding ASA 81 mg po daily. I will discuss with Dr Darrold Span today and she will follow-up on results of hypercoagulable tests. Would also follow platelets daily while on heparin.   Appreciate your help to Ms Doffing!   Lowella Dell, MD 01/22/2013  7:32 AM

## 2013-01-22 NOTE — Evaluation (Signed)
Speech Language Pathology Evaluation Patient Details Name: Monique [name] MRN: [id number] DOB: 1946-04-09 Today's Date: 01/22/2013 Time: 1250-1300 SLP Time Calculation (min): 10 min  Problem List:  Patient Active Problem List   Diagnosis Date Noted  . Dysarthria due to cerebrovascular accident 12/11/2012  . 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 Hypercoagulopathy 12/11/2012  . 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 History of DVT (deep vein thrombosis) 12/11/2012  . Encounter for long-term (current) use of high-risk medication 12/11/2012  . Acute on chronic systolic heart failure 12/11/2012  . Non-sustained ventricular tachycardia 12/11/2012  . Thrombocytopenia, unspecified 12/11/2012  . Hypokalemia 12/11/2012  . patient had hemorrhagic transformation of ischemic stroke 12/11/2012  . CVA (cerebral infarction) 12/04/2012  . Elevated troponin 12/04/2012  . Systolic heart failure 12/04/2012    Class: Acute  . Ovarian cancer 06/12/2012  . Personal history of venous thrombosis and embolism 06/12/2012   Past Medical History:  Past Medical History  Diagnosis Date  . Acute venous embolism and thrombosis of unspecified deep vessels of lower extremity   . Bronchitis     several times  . Abdominal or pelvic swelling, mass or lump, unspecified site   . Phlebitis and thrombophlebitis of unspecified site   . Pelvic mass   . GERD (gastroesophageal reflux disease)   . PONV (postoperative nausea and vomiting)     "when fentanyl is used, will break out in itchy rast"  . Basal cell carcinoma of face   . Ovarian cancer   . CHF (congestive heart failure)     "just since 11/2012; they think it's from the chemo" (01/21/2013)  . 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 DVT (deep venous thrombosis) 04/2012    "BLE" (01/21/2013)  . Chronic bronchitis     "I had it several times; I used to smoke"  (01/21/2013)  . Shortness of breath     "trouble taking a deep breath at times before RX started; I'm fine now" (01/21/2013)  . Anemia     "because of the chemo" (01/21/2013)  . Arthritis     "little in my right hand" (01/21/2013)  . Anxiety     "related to ovarian cancer; don't take RX for it" (01/21/2013)   Past Surgical History:  Past Surgical History  Procedure Laterality Date  . Appendectomy  1966    ruptured, hospital for 3 weeks  . Rhinoplasty  1976  . Vein ligation and stripping Right 1998    leg  . Mohs surgery  2004, 2005    basal cell of the face  . Tooth extraction  03/2011?    "just one; had a root canal that got infected" (01/21/2013)  . Laparotomy N/A 09/10/2012    Procedure: EXPLORATORY LAPAROTOMY, BILATERAL SALPINGO OOPHORECTOMY, TUMOR DEBULKING;  Surgeon: Rejeana Brock A. Duard Brady, MD;  Location: [facility];  Service: Gynecology;  Laterality: N/A;  . Exploratory laparotomy  09/10/12    Lysis of adhesions, BSO, suboptimal tumor debulking  . Vena cava filter placement  08/2012   HPI:  67 y.o. female with complex past medical history significant for advanced age bilaterally and carcinoma on chemotherapy, recent embolic strokes in 8/14 (Pt. reported speech deficits, almost resolved and currently having outpt. 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  Has been taking Lovenox milligrams subcutaneous daily for the last 8 days, yesterday couple hours after her shot, noticed a small swollen subcutaneous area in the left side of her abdomen, along with bruising concerning for hematoma.  She called the cancer Center today and was on the phone with Dr. Precious Reel nurse when she started slurring her speech again and was unable to speak about 3-5 seconds, subsequently asked to come  to the ER where an MRI scan noted continued development of small infarct in the right nares rolandic cortex and right cerebellar hemisphere.    Assessment / Plan / Recommendation Clinical Impression  Pt. presents with a flat affect during most of assessment.  Speech and cognitive abilities appeared WFL's.  Pt. is 6 weeks s/p CVA and states she is currently receiving Speech therapy at outpatient with frequency decreased to 1 x week from 2.  Verbal expression was primarily fluent with several hesitancies and one minimal dysnomia.  She reports her significant other is staying with her and helps her recall appointments.  Do not recommend ST while in acute care but recommend she continue outpatient ST services.     SLP Assessment  All further Speech Lanaguage Pathology  needs can be addressed in the next venue of care    Follow Up Recommendations  Outpatient SLP    Frequency and Duration        Pertinent Vitals/Pain none       SLP Evaluation Prior Functioning  Cognitive/Linguistic Baseline: Baseline deficits (speech impairments/? 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  Overall Cognitive Status: Within Functional Limits for tasks assessed Orientation Level: Oriented X4 Safety/Judgment: Appears intact    Comprehension  Auditory Comprehension Overall Auditory Comprehension: Appears within functional limits for tasks assessed Visual Recognition/Discrimination Discrimination: Not tested Reading Comprehension Reading Status:  (states no difficulty)    Expression Expression Primary Mode of Expression: Verbal Verbal Expression Overall Verbal Expression:  (overall WFL, several hesitancies/dysnomia x 1 ?) Level of Generative/Spontaneous Verbalization: Librarian, academic: Impairment Impairments: Abnormal affect (flat affect) Written Expression Dominant Hand: Right Written Expression: Not tested (no  difficulty stated)   Oral / Motor Oral Motor/Sensory Function Overall Oral Motor/Sensory Function: Appears within functional limits for tasks assessed Motor Speech Overall Motor Speech: Appears within functional limits for tasks assessed Intelligibility: Intelligible Motor Planning: Witnin functional limits        [employer] [phone]  01/22/2013

## 2013-01-22 NOTE — Progress Notes (Signed)
Stroke Team Progress Note  HISTORY Monique [name] is an 67 y.o. 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   She said that she her Lovenox was resumed 8 days ago and yesterday after her Lovenox shot she was talking on the phone with her cancer nurse when suddenly started having slurred speech and was unable to speak for few seconds. She was asked by the nurse to present to the ED for further evaluation where she had a brain MRI that disclosed several small infarcts in the right peri-Rolandic cortex and right cerebellar hemisphere, acute to subacute in appearance.  She denies associated HA, vertigo, double vision, difficulty swallowing, focal weakness or numbness, confusion.  Monique [name] that her speech and language are improving.   Prior stroke last August occurred while on xarelto and she was heparinized in the acute stroke setting. She was discharged on coumadin (did not clinical cerebrovascular events while on coumadin for few weeks), then switched to Lovenox. Stroke work up carried out at that time was unrevealing.   Patient was not a TPA candidate secondary to symptoms starting on 02/19/2013; out of window. She was admitted for further evaluation and treatment.  SUBJECTIVE Her husband is at the bedside.  Overall she feels her condition is completely resolved.   OBJECTIVE Most recent Vital Signs: Filed Vitals:   01/22/13 0000 01/22/13 0200 01/22/13 0400 01/22/13 0600  BP: 97/80 90/68 95/60  97/64  Pulse: 80 60 55 66  Temp:    98.6 F (37 C)  TempSrc:    Oral  Resp: 18 18 18 18   Height:      Weight:      SpO2: 99% 99% 99% 99%   CBG (last 3)  No results found  for this basename: GLUCAP,  in the last 72 hours  IV Fluid Intake:   . heparin 850 Units/hr (01/21/13 2328)    MEDICATIONS  . metoprolol succinate  12.5 mg Oral BID  . pantoprazole  40 mg Oral Daily   PRN:  LORazepam, oxyCODONE-acetaminophen, polyethylene glycol, senna-docusate  Diet:  General thin liquids Activity:  Activity as tolerated DVT Prophylaxis:  IV heparin gtt  CLINICALLY SIGNIFICANT STUDIES Basic Metabolic Panel:  Recent Labs Lab 01/21/13 1140 01/21/13 1147  NA 135 139  K 3.9 4.0  CL 100 102  CO2 24  --   GLUCOSE 98 97  BUN 12 12  CREATININE 0.64 0.80  CALCIUM 9.6  --    Liver Function Tests:  Recent Labs Lab 01/21/13 1140  AST 31  ALT 25  ALKPHOS 110  BILITOT 0.2*  PROT 7.3  ALBUMIN 3.2*   CBC:  Recent Labs Lab 01/21/13 1140 01/21/13 1147  WBC 4.6  --   NEUTROABS 3.1  --   HGB 9.4* 12.9  HCT 28.6* 38.0  MCV 88.3  --   PLT 102*  --    Coagulation:  Recent Labs Lab 01/21/13 1140  LABPROT 13.3  INR 1.03   Cardiac Enzymes:  Recent Labs Lab 01/21/13 1140 01/21/13 1928 01/22/13 0138  TROPONINI 0.43* 0.41* 0.51*   Urinalysis:  Recent Labs Lab 01/21/13 1351  COLORURINE YELLOW  LABSPEC 1.021  PHURINE 6.0  GLUCOSEU NEGATIVE  HGBUR TRACE*  BILIRUBINUR NEGATIVE  KETONESUR  NEGATIVE  PROTEINUR NEGATIVE  UROBILINOGEN 0.2  NITRITE NEGATIVE  LEUKOCYTESUR NEGATIVE   Lipid Panel    Component Value Date/Time   CHOL 148 12/05/2012 0455   TRIG 89 12/05/2012 0455   HDL 43 12/05/2012 0455   CHOLHDL 3.4 12/05/2012 0455   VLDL 18 12/05/2012 0455   LDLCALC 87 12/05/2012 0455   HgbA1C  Lab Results  Component Value Date   HGBA1C 5.4 12/04/2012    Urine Drug Screen:     Component Value Date/Time   LABOPIA NONE DETECTED 01/21/2013 1351   COCAINSCRNUR NONE DETECTED 01/21/2013 1351   LABBENZ NONE DETECTED 01/21/2013 1351   AMPHETMU NONE DETECTED 01/21/2013 1351   THCU NONE DETECTED 01/21/2013 1351   LABBARB NONE DETECTED 01/21/2013 1351     Alcohol Level:  Recent Labs Lab 01/21/13 1140  ETH <11    Ct Head Wo Contrast 01/21/2013 No acute abnormality and no change from the prior CT.    Mr Brain Wo Contrast 01/21/2013  1. Continued development of small infarcts in the right peri-Rolandic cortex and right cerebellar hemisphere. Several small infarcts appear to be acute to early subacute. No associated mass effect or hemorrhage.  2. Otherwise regressed diffusion abnormality in the brain since 12/05/2012. Expected evolution of the previously-seen larger infarcts, including at the left frontal operculum and left cerebellar hemisphere.   Electronically Signed   By: Augusto Gamble M.D.   On: 01/21/2013 13:43   MRA of the brain    2D Echocardiogram  11/27/2012 EF 15%, diffuse HK, LA dilated.  Carotid Doppler  12/04/2012 Findings consistent with less than 39 percent stenosis involving the right internal carotid artery and the left internal carotid artery.  CXR    EKG  NSR.   Therapy Recommendations none  Physical Exam   Mental Status:  Alert, oriented, thought content appropriate. Speech fluent without evidence of aphasia. Able to follow 3 step commands without difficulty. Minimal dysarthria. Cranial Nerves:  II: Discs flat bilaterally; Visual fields grossly normal, pupils equal, round, reactive to light and accommodation  III,IV, VI: ptosis not present, extra-ocular motions intact bilaterally  V,VII: smile symmetric, facial light touch sensation normal bilaterally  VIII: hearing normal bilaterally  IX,X: gag reflex present  XI: bilateral shoulder shrug  XII: midline tongue extension  Motor:  Right : Upper extremity 5/5 Left: Upper extremity 5/5  Lower extremity 5/5 Lower extremity 5/5  Tone and bulk:normal tone throughout; no atrophy noted  Sensory: Pinprick and light touch intact throughout, bilaterally  Deep Tendon Reflexes:  Right: Upper Extremity Left: Upper extremity  biceps (C-5 to C-6) 2/4 biceps (C-5 to C-6) 2/4   tricep (C7) 2/4 triceps (C7) 2/4  Brachioradialis (C6) 2/4 Brachioradialis (C6) 2/4  Lower Extremity Lower Extremity  quadriceps (L-2 to L-4) 2/4 quadriceps (L-2 to L-4) 2/4  Achilles (S1) 2/4 Achilles (S1) 2/4  Plantars:  Right: downgoing Left: downgoing  Cerebellar:  normal finger-to-nose, normal heel-to-shin test  Gait:  No ataxia.   ASSESSMENT Monique [name] is a 67 y.o. female presenting with dysarthria and transient aphasia. Imaging confirms a continued development of small infarcts in the right peri-Rolandic cortex and right cerebellar hemisphere. Several small infarcts appear to be acute to early subacute. In addition, evolution of the previously-seen larger infarcts, including at the left frontal operculum and left cerebellar hemisphere.Infarct felt to be embolic secondary to hypercoagulable state.  On full dose lovenox prior to admission. Now on heparin for secondary stroke prevention. Patient with resultant transient aphasia. Work  up underway.   Thrombophlebitis on coumadin  Recent stroke on xarelto  This admission with new stroke while on full dose lovenox  Ovarian cancer  Hypercoagulable state, probable secondary to cancer.  History of LE DVT  Stroke  Hospital day # 1  TREATMENT/PLAN  Change to  warfarin and baby aspirin daily (continue IV heparin until therapeutic INR) for secondary stroke prevention.  Patient will need to remain on IV Heparin until INR therapeutic.  Pharmacy following  Gwendolyn Lima. Manson Passey, [facility], MBA, MHA [facility] Pager: [phone] 01/22/2013 4:28 PM  I have personally obtained a history, examined the patient, evaluated imaging results, and formulated the assessment and plan of care. I agree with the above. Delia Heady, MD

## 2013-01-23 DIAGNOSIS — D696 Thrombocytopenia, unspecified: Secondary | ICD-10-CM

## 2013-01-23 DIAGNOSIS — Z79899 Other long term (current) drug therapy: Secondary | ICD-10-CM

## 2013-01-23 LAB — HEPARIN LEVEL (UNFRACTIONATED)
Heparin Unfractionated: 0.4 IU/mL (ref 0.30–0.70)
Heparin Unfractionated: 0.26 IU/mL — ABNORMAL LOW (ref 0.30–0.70)
Heparin Unfractionated: 0.26 IU/mL — ABNORMAL LOW (ref 0.30–0.70)

## 2013-01-23 LAB — CBC
Hemoglobin: 9 g/dL — ABNORMAL LOW (ref 12.0–15.0)
MCH: 30.3 pg (ref 26.0–34.0)
MCHC: 34 g/dL (ref 30.0–36.0)
MCV: 89.2 fL (ref 78.0–100.0)
Platelets: 90 10*3/uL — ABNORMAL LOW (ref 150–400)
RBC: 2.97 MIL/uL — ABNORMAL LOW (ref 3.87–5.11)
RDW: 17.6 % — ABNORMAL HIGH (ref 11.5–15.5)

## 2013-01-23 LAB — PROTEIN S ACTIVITY: Protein S Activity: 110 % (ref 69–129)

## 2013-01-23 MED ORDER — WARFARIN SODIUM 5 MG PO TABS
5.0000 mg | ORAL_TABLET | Freq: Once | ORAL | Status: AC
  Administered 2013-01-23: 5 mg via ORAL
  Filled 2013-01-23 (×2): qty 1

## 2013-01-23 MED ORDER — SODIUM CHLORIDE 0.9 % IJ SOLN
10.0000 mL | INTRAMUSCULAR | Status: DC | PRN
  Administered 2013-01-23 – 2013-01-28 (×5): 10 mL

## 2013-01-23 NOTE — Progress Notes (Signed)
ANTICOAGULATION CONSULT NOTE - Follow Up Consult  Pharmacy Consult for Heparin Indication: CVA with high risk of thrombosis  Allergies  Allergen Reactions  . Septra [Sulfamethoxazole-Tmp Ds] Rash and Other (See Comments)    High fever, Burning skin  . Ampicillin Itching  . Fentanyl And Related Hives    Per Dr. Acey Lav, pt reports she is ok with fentanyl (09/10/12). Patient states it was epidural form of Fentanyl that she reacted to.   . Neosporin [Neomycin-Bacitracin Zn-Polymyx] Swelling and Other (See Comments)    opthalmic solution only can use topically. Swelling and inflamed eyes    Patient Measurements: Height: 5\' 7"  (170.2 cm) Weight: 137 lb 12.8 oz (62.506 kg) IBW/kg (Calculated) : 61.6 Heparin Dosing Weight:   Vital Signs: Temp: 97.7 F (36.5 C) (10/02 1942) Temp src: Oral (10/02 1942) BP: 93/59 mmHg (10/02 1942) Pulse Rate: 70 (10/02 1942)  Labs:  Recent Labs  01/21/13 1140 01/21/13 1147 01/21/13 1928 01/22/13 0138 01/22/13 0755  01/23/13 0250 01/23/13 0940 01/23/13 2234  HGB 9.4* 12.9  --   --  9.8*  --  9.0*  --   --   HCT 28.6* 38.0  --   --  29.0*  --  26.5*  --   --   PLT 102*  --   --   --  91*  --  90*  --   --   APTT 29  --   --   --   --   --   --   --   --   LABPROT 13.3  --   --   --   --   --  14.0  --   --   INR 1.03  --   --   --   --   --  1.10  --   --   HEPARINUNFRC  --   --   --   --  0.16*  < > 0.40 0.26* 0.26*  CREATININE 0.64 0.80  --   --   --   --   --   --   --   TROPONINI 0.43*  --  0.41* 0.51* <0.30  --   --   --   --   < > = values in this interval not displayed.  Estimated Creatinine Clearance: 68.2 ml/min (by C-G formula based on Cr of 0.8).   Assessment: 67 yo F admitted 01/21/2013 with stroke despite treatment with enoxaparin PTA. CT and MR negative for hemorrhage  PMH: Ovarian CA, stroke 8/14, DVT s/p IVC filter, EF 15%, GERD  AC: stroke in setting of presumed hypercoagulable state due to CA, on IV heparin (goal  0.3-0.5). Heparin level remains at 0.26 slightly low. INR 1.1. Hgb down to 9. Plts 90.  Goal of Therapy:  HL 0.3-0.5 Monitor platelets by anticoagulation protocol: Yes   Plan:  Increase IV heparin to 1500 units/hr F/u am labs    Talbert Cage, PharmD Clinical Staff Pharmacist Pager [phone]

## 2013-01-23 NOTE — Progress Notes (Addendum)
Discussed with Dr. Darrold Span  [facility] PROGRESS NOTE  Monique [name] ZOX:[id number] DOB: 1945-11-05 DOA: 01/21/2013 PCP: Aura Dials, MD  Assessment/Plan:  Acute ischemic stroke related to hypercoaguable state.  onc and neuro rec warfarin and iv heparin.  Agree with neuro's plans to keep patient in hospital on heparin drip until therapeutic warfarin, and due to patient's complex course, recurrent embolic events, thrombocytopenia, etc.   Ovarian cancer, advanced.  No further gemzar per Dr. Darrold Span.  Prognosis guarded, as cancer has progressed on treatment.     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 Notified Dr. Katrinka Blazing of patient's admission per patient's request. No specific questions to address. Patient did followup with Dr. Katrinka Blazing last week in the office.   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 Electrolytes okay. Minimally elevated troponin.  Appears chronic.  No chest pain. No evidence of ACS.  No further w/u  HPI/Subjective: No dyspnea. No new complaints. No dizziness.  Objective: Filed Vitals:   01/23/13 0830  BP: 106/65  Pulse: 73  Temp: 98.8 F (37.1 C)  Resp: 20    Intake/Output Summary (Last 24 hours) at 01/23/13 1023 Last data filed at 01/23/13 0854  Gross per 24 hour  Intake    300 ml  Output    200 ml  Net    100 ml   Filed Weights   01/21/13 2100  Weight: 62.506 kg (137 lb 12.8 oz)    Exam:   General:  Alert, oriented  Neck: No JVD.  Cardiovascular: RRR without MGR  Respiratory: CTA without WRR  Abdomen: S, NT, ND  Ext: no CCE  Neuro:  Speech improved from yesterday. Weakness to be ambulating in the hall earlier today. Motor strength 5 out of 5. Sensation intact   Data  Reviewed: Basic Metabolic Panel:  Recent Labs Lab 01/21/13 1140 01/21/13 1147 01/22/13 1600  NA 135 139  --   K 3.9 4.0 3.8  CL 100 102  --   CO2 24  --   --   GLUCOSE 98 97  --   BUN 12 12  --   CREATININE 0.64 0.80  --   CALCIUM 9.6  --   --   MG  --   --  1.9   Liver Function Tests:  Recent Labs Lab 01/21/13 1140  AST 31  ALT 25  ALKPHOS 110  BILITOT 0.2*  PROT 7.3  ALBUMIN 3.2*   No results found for this basename: LIPASE, AMYLASE,  in the last 168 hours No results found for this basename: AMMONIA,  in the last 168 hours CBC:  Recent Labs Lab 01/21/13 1140 01/21/13 1147 01/22/13 0755 01/23/13 0250  WBC 4.6  --  5.2 5.1  NEUTROABS 3.1  --   --   --   HGB 9.4* 12.9 9.8* 9.0*  HCT 28.6* 38.0 29.0* 26.5*  MCV 88.3  --  87.9 89.2  PLT 102*  --  91* 90*   Cardiac Enzymes:  Recent Labs Lab 01/21/13 1140 01/21/13 1928 01/22/13 0138 01/22/13 0755  TROPONINI 0.43* 0.41* 0.51* <0.30   BNP (last 3 results)  Recent Labs  12/05/12 1417  PROBNP 1659.0*   CBG: No results found for this basename: GLUCAP,  in the last 168 hours  No results found for this  or any previous visit (from the past 240 hour(s)).   Studies: Ct Head Wo Contrast  01/21/2013   CLINICAL DATA:  Slurred speech.  EXAM: CT HEAD WITHOUT CONTRAST  TECHNIQUE: Contiguous axial images were obtained from the base of the skull through the vertex without intravenous contrast.  COMPARISON:  CT 12/06/2012  FINDINGS: Generalized atrophy. Mild ventricular enlargement, stable.  Chronic infarct left cerebellum is unchanged. No acute infarct. Negative for hemorrhage or mass lesion.  IMPRESSION: No acute abnormality and no change from the prior CT.   Electronically Signed   By: Marlan Palau M.D.   On: 01/21/2013 12:38   Mr Brain Wo Contrast  01/21/2013   CLINICAL DATA:  67 year old female with sudden onset of loss of speech. Recent stroke, last month. History of ovarian cancer.  EXAM: MRI HEAD WITHOUT  CONTRAST  TECHNIQUE: Multiplanar, multisequence MR imaging was performed. No intravenous contrast was administered.  COMPARISON:  Head CT without contrast 01/21/2013. Brain MRI 12/05/2012.  FINDINGS: Major intracranial vascular flow voids are stable and within normal limits.  Regressed diffusion signal abnormality in the left hemisphere. No definite residual restricted diffusion. Developing gliosis/encephalomalacia at the frontal operculum (series 6, images 16 and 17).  Scattered small mostly cortically based areas of restricted diffusion in the superior right hemisphere have in part regressed, but could some new areas of involvement which do appear restricted are identified (series 3, image 24 and series 300, image 24) mild associated T2 and FLAIR hyperintensity. No associated mass effect or hemorrhage. No chronic blood products identified at the areas of previous insult.  No deep gray matter or brainstem and diffusion abnormality.  Regressed diffusion abnormality in the cerebellum. Evolved linear infarcts in the left cerebellum. In the right hemisphere there is a small linear area of diffusion abnormality which appears mildly restricted (series 3, image 9 and series 300, image 9). No associated mass effect or hemorrhage.  Stable ventricle size and configuration. No midline shift, mass effect, or evidence of intracranial mass lesion. No acute intracranial hemorrhage identified. Negative pituitary, cervicomedullary junction, and visualized cervical spine. Normal bone marrow signal. Stable cerebral volume.  Visualized orbit soft tissues are within normal limits. Visualized paranasal sinuses and mastoids are clear. Normal visible internal auditory structures. Negative scalp soft tissues.  IMPRESSION: 1. Continued development of small infarcts in the right peri-Rolandic cortex and right cerebellar hemisphere. Several small infarcts appear to be acute to early subacute. No associated mass effect or hemorrhage.  2.  Otherwise regressed diffusion abnormality in the brain since 12/05/2012. Expected evolution of the previously-seen larger infarcts, including at the left frontal operculum and left cerebellar hemisphere.   Electronically Signed   By: Augusto Gamble M.D.   On: 01/21/2013 13:43    Scheduled Meds: . lisinopril  2.5 mg Oral Daily  . metoprolol succinate  12.5 mg Oral BID  . pantoprazole  40 mg Oral Daily  . spironolactone  25 mg Oral Daily  . warfarin   Does not apply Once  . Warfarin - Pharmacist Dosing Inpatient   Does not apply q1800   Continuous Infusions: . heparin 1,250 Units/hr (01/23/13 0107)    35 min  Mostafa Yuan L  [facility] Pager [phone]. If 7PM-7AM, please contact night-coverage at [email or web address], password [facility] 01/23/2013, 10:23 AM  LOS: 2 days

## 2013-01-23 NOTE — Progress Notes (Signed)
Pt had 9 beats of v tach. Pt asymptomatic. Pt denied any CP or SOB. Md on call made aware. Will cont to monitor pt.

## 2013-01-23 NOTE — Progress Notes (Signed)
ANTICOAGULATION CONSULT NOTE - Follow Up Consult  Pharmacy Consult for Heparin/Coumadin Indication: CVA with high risk of thrombosis  Allergies  Allergen Reactions  . Septra [Sulfamethoxazole-Tmp Ds] Rash and Other (See Comments)    High fever, Burning skin  . Ampicillin Itching  . Fentanyl And Related Hives    Per Dr. Acey Lav, pt reports she is ok with fentanyl (09/10/12). Patient states it was epidural form of Fentanyl that she reacted to.   . Neosporin [Neomycin-Bacitracin Zn-Polymyx] Swelling and Other (See Comments)    opthalmic solution only can use topically. Swelling and inflamed eyes    Patient Measurements: Height: 5\' 7"  (170.2 cm) Weight: 137 lb 12.8 oz (62.506 kg) IBW/kg (Calculated) : 61.6 Heparin Dosing Weight:   Vital Signs: Temp: 98.8 F (37.1 C) (10/02 0830) Temp src: Oral (10/02 0830) BP: 120/41 mmHg (10/02 1026) Pulse Rate: 68 (10/02 1026)  Labs:  Recent Labs  01/21/13 1140 01/21/13 1147 01/21/13 1928 01/22/13 0138  01/22/13 0755 01/22/13 1600 01/23/13 0250 01/23/13 0940  HGB 9.4* 12.9  --   --   --  9.8*  --  9.0*  --   HCT 28.6* 38.0  --   --   --  29.0*  --  26.5*  --   PLT 102*  --   --   --   --  91*  --  90*  --   APTT 29  --   --   --   --   --   --   --   --   LABPROT 13.3  --   --   --   --   --   --  14.0  --   INR 1.03  --   --   --   --   --   --  1.10  --   HEPARINUNFRC  --   --   --   --   < > 0.16* 0.15* 0.40 0.26*  CREATININE 0.64 0.80  --   --   --   --   --   --   --   TROPONINI 0.43*  --  0.41* 0.51*  --  <0.30  --   --   --   < > = values in this interval not displayed.  Estimated Creatinine Clearance: 68.2 ml/min (by C-G formula based on Cr of 0.8).   Assessment: 67 yo F admitted 01/21/2013 with stroke despite treatment with enoxaparin PTA. CT and MR negative for hemorrhage  PMH: Ovarian CA, stroke 8/14, DVT s/p IVC filter, EF 15%, GERD  AC: stroke in setting of presumed hypercoagulable state due to CA, on IV heparin  (goal 0.3-0.5). Heparin level 0.26 slightly low. INR 1.1. Hgb down to 9. Plts 90.  Cardiovascular: NICM, chronic systolic HF, BP 120/41, HR 69 NSR . LDL 90 - statin when able? Meds: lisinopril, Toprol, Spironolactone. Last K=3.8  Endocrinology; CBGs 90s  Gastrointestinal / Nutrition: po protonix (on pta)  Neurology: no further speech slurring or focal weakness  Nephrology: sCr 0.80  Pulmonary: RA  Hematology / Oncology: advanced stage b/l ovarian CA s/p chemo, Hgb 12.9>>9, plts 102>>90 (has hx thrombocytopenia)  PTA Medication Issues: none  Best Practices: IV heparin  Goal of Therapy:  HL 0.3-0.5 Monitor platelets by anticoagulation protocol: Yes   Plan:  Increase IV heparin to 1350 units/hr Repeat Coumadin 5mg  po x 1 again tonight.   Daritza Brees S. Merilynn Finland, PharmD, BCPS Clinical Staff Pharmacist Pager [phone]  Misty Stanley Stillinger 01/23/2013,12:48  PM   

## 2013-01-23 NOTE — Progress Notes (Signed)
Notified by pharmacy at 2216 that pt. Had heparin level due at 1930. This is first time I was notified of lab to be drawn. Heparin level drawn at 2234 and sent to lab. Colbert Ewing RN VAST.

## 2013-01-23 NOTE — Progress Notes (Signed)
Stroke Team Progress Note  HISTORY Monique [name] is an 67 y.o. 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   She said that she her Lovenox was resumed 8 days ago and yesterday after her Lovenox shot she was talking on the phone with her cancer nurse when suddenly started having slurred speech and was unable to speak for few seconds. She was asked by the nurse to present to the ED for further evaluation where she had a brain MRI that disclosed several small infarcts in the right peri-Rolandic cortex and right cerebellar hemisphere, acute to subacute in appearance.  She denies associated HA, vertigo, double vision, difficulty swallowing, focal weakness or numbness, confusion.  [name] that her speech and language are improving.   Prior stroke last August occurred while on xarelto and she was heparinized in the acute stroke setting. She was discharged on coumadin (did not clinical cerebrovascular events while on coumadin for few weeks), then switched to Lovenox. Stroke work up carried out at that time was unrevealing.   Patient was not a TPA candidate secondary to symptoms starting on 02/19/2013; out of window. She was admitted for further evaluation and treatment.  SUBJECTIVE Patient sitting up in bed. Husband in room. Patient denies any further symptoms  OBJECTIVE Most recent Vital Signs: Filed Vitals:   01/22/13 2000 01/23/13 0000 01/23/13 0500 01/23/13 0641  BP: 103/56 90/53 88/43  95/54  Pulse: 77 64 73   Temp: 98.6 F (37 C) 98.8 F (37.1 C) 98.2 F (36.8 C)   TempSrc: Oral Oral Oral   Resp: 18 18 18    Height:      Weight:      SpO2: 100% 99% 100%    CBG  (last 3)  No results found for this basename: GLUCAP,  in the last 72 hours  IV Fluid Intake:   . heparin 1,250 Units/hr (01/23/13 0107)    MEDICATIONS  . lisinopril  2.5 mg Oral Daily  . metoprolol succinate  12.5 mg Oral BID  . pantoprazole  40 mg Oral Daily  . spironolactone  25 mg Oral Daily  . warfarin   Does not apply Once  . Warfarin - Pharmacist Dosing Inpatient   Does not apply q1800   PRN:  LORazepam, oxyCODONE-acetaminophen, polyethylene glycol, senna-docusate, sodium chloride  Diet:  General thin liquids Activity:  Activity as tolerated DVT Prophylaxis:  IV heparin gtt  CLINICALLY SIGNIFICANT STUDIES Basic Metabolic Panel:   Recent Labs Lab 01/21/13 1140 01/21/13 1147 01/22/13 1600  NA 135 139  --   K 3.9 4.0 3.8  CL 100 102  --   CO2 24  --   --   GLUCOSE 98 97  --   BUN 12 12  --   CREATININE 0.64 0.80  --   CALCIUM 9.6  --   --   MG  --   --  1.9   Liver Function Tests:   Recent Labs Lab 01/21/13 1140  AST 31  ALT 25  ALKPHOS 110  BILITOT 0.2*  PROT 7.3  ALBUMIN 3.2*   CBC:  Recent Labs Lab 01/21/13 1140  01/22/13 0755 01/23/13 0250  WBC 4.6  --  5.2 5.1  NEUTROABS 3.1  --   --   --  HGB 9.4*  < > 9.8* 9.0*  HCT 28.6*  < > 29.0* 26.5*  MCV 88.3  --  87.9 89.2  PLT 102*  --  91* 90*  < > = values in this interval not displayed. Coagulation:   Recent Labs Lab 01/21/13 1140 01/23/13 0250  LABPROT 13.3 14.0  INR 1.03 1.10   Cardiac Enzymes:   Recent Labs Lab 01/21/13 1928 01/22/13 0138 01/22/13 0755  TROPONINI 0.41* 0.51* <0.30   Urinalysis:   Recent Labs Lab 01/21/13 1351  COLORURINE YELLOW  LABSPEC 1.021  PHURINE 6.0  GLUCOSEU NEGATIVE  HGBUR TRACE*  BILIRUBINUR NEGATIVE  KETONESUR NEGATIVE  PROTEINUR NEGATIVE  UROBILINOGEN 0.2  NITRITE NEGATIVE  LEUKOCYTESUR NEGATIVE   Lipid Panel    Component Value Date/Time   CHOL 149 01/22/2013 0755   TRIG 122 01/22/2013 0755   HDL 35* 01/22/2013 0755   CHOLHDL 4.3  01/22/2013 0755   VLDL 24 01/22/2013 0755   LDLCALC 90 01/22/2013 0755   HgbA1C  Lab Results  Component Value Date   HGBA1C 5.9* 01/22/2013    Urine Drug Screen:     Component Value Date/Time   LABOPIA NONE DETECTED 01/21/2013 1351   COCAINSCRNUR NONE DETECTED 01/21/2013 1351   LABBENZ NONE DETECTED 01/21/2013 1351   AMPHETMU NONE DETECTED 01/21/2013 1351   THCU NONE DETECTED 01/21/2013 1351   LABBARB NONE DETECTED 01/21/2013 1351    Alcohol Level:   Recent Labs Lab 01/21/13 1140  ETH <11    Ct Head Wo Contrast 01/21/2013 No acute abnormality and no change from the prior CT.    Mr Brain Wo Contrast 01/21/2013  1. Continued development of small infarcts in the right peri-Rolandic cortex and right cerebellar hemisphere. Several small infarcts appear to be acute to early subacute. No associated mass effect or hemorrhage.  2. Otherwise regressed diffusion abnormality in the brain since 12/05/2012. Expected evolution of the previously-seen larger infarcts, including at the left frontal operculum and left cerebellar hemisphere.   Electronically Signed   By: Augusto Gamble M.D.   On: 01/21/2013 13:43   MRA of the brain    2D Echocardiogram  11/27/2012 EF 15%, diffuse HK, LA dilated.  Carotid Doppler  12/04/2012 Findings consistent with less than 39 percent stenosis involving the right internal carotid artery and the left internal carotid artery.  CXR    EKG  NSR.   Therapy Recommendations none  Physical Exam   Mental Status:  Alert, oriented, thought content appropriate. Speech fluent without evidence of aphasia. Able to follow 3 step commands without difficulty. Minimal dysarthria. Cranial Nerves:  II: Discs flat bilaterally; Visual fields grossly normal, pupils equal, round, reactive to light and accommodation  III,IV, VI: ptosis not present, extra-ocular motions intact bilaterally  V,VII: smile symmetric, facial light touch sensation normal bilaterally  VIII: hearing normal bilaterally   IX,X: gag reflex present  XI: bilateral shoulder shrug  XII: midline tongue extension  Motor:  Right : Upper extremity 5/5 Left: Upper extremity 5/5  Lower extremity 5/5 Lower extremity 5/5  Tone and bulk:normal tone throughout; no atrophy noted  Sensory: Pinprick and light touch intact throughout, bilaterally  Deep Tendon Reflexes:  Right: Upper Extremity Left: Upper extremity  biceps (C-5 to C-6) 2/4 biceps (C-5 to C-6) 2/4  tricep (C7) 2/4 triceps (C7) 2/4  Brachioradialis (C6) 2/4 Brachioradialis (C6) 2/4  Lower Extremity Lower Extremity  quadriceps (L-2 to L-4) 2/4 quadriceps (L-2 to L-4) 2/4  Achilles (S1) 2/4 Achilles (S1) 2/4  Plantars:  Right: downgoing Left: downgoing  Cerebellar:  normal finger-to-nose, normal heel-to-shin test  Gait:  No ataxia.   ASSESSMENT Ms. KYMBERLYN ECKFORD is a 67 y.o. female presenting with dysarthria and transient aphasia. Imaging confirms a continued development of small infarcts in the right peri-Rolandic cortex and right cerebellar hemisphere. Several small infarcts appear to be acute to early subacute. In addition, evolution of the previously-seen larger infarcts, including at the left frontal operculum and left cerebellar hemisphere.Infarct felt to be embolic secondary to hypercoagulable state.  On full dose lovenox prior to admission. Now on heparin for secondary stroke prevention. Patient with resultant transient aphasia. Work up complete.   Thrombophlebitis on coumadin  Recent stroke on xarelto  This admission with new stroke while on full dose lovenox  Ovarian cancer  Hypercoagulable state, probable secondary to cancer.  History of LE DVT  Stroke  Hospital day # 2  TREATMENT/PLAN  Change to  warfarin and baby aspirin daily (continue IV heparin until therapeutic INR) for secondary stroke prevention.  Patient will need to remain on IV Heparin until INR therapeutic. INR 1.1 today.  Pharmacy following  Have patient follow up  with Dr. Pearlean Brownie in 2 months.  Stroke service to sign off.  Gwendolyn Lima. Manson Passey, [facility], MBA, MHA [facility] Pager: [phone] 01/23/2013 7:32 AM  I have personally obtained a history, examined the patient, evaluated imaging results, and formulated the assessment and plan of care. I agree with the above. Delia Heady, MD

## 2013-01-23 NOTE — Progress Notes (Signed)
Medical Oncology 01/23/2013, 10:12 AM  Hospital day 3 Antibiotics: none Chemotherapy: cycle 3 gemzar 01-13-13, dose reduced due to thrombocytopenia cycle 2    Subjective:    Patient seen, alone for visit. Awake, alert and generally comfortable in bed. Speech is back to recent baseline, no further acute neurologic changes overnight. No bleeding, now on heparin infusion with plan to transition to coumadin + ASA. No nausea/ vomiting from recent chemo. PICC RUE being used for blood draws, with peripheral IV for heparin LUE. No symptoms with arrhythmias (nonsustained VT) documented overnight. Slept a little last pm, appetite good, has been up to BR, slight discomfort behind left knee with no tender varicosities there.    ONCOLOGIC HISTORY  Patient saw GI physician in ~ Aug 2013 due to several months of abdominal pain, but was unable to tolerate prep for colonoscopy such that this was never accomplished. Constipation improved with probiotics and Activia. She developed abdominal fullness with early satiety and some reflux symptoms, then LLE superficial phlebitis in Jan 2014 (dopplers by PCP negative for DVT then), followed by swelling and discomfort in left medial thigh with venous doppler at [facility] in [facility] on 05-21-12 with acute thrombus left common femoral vein. 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 She was begun on lovenox and transitioned to Xarelto by Dr Everlene Other. CA 125 was 569 initially. She saw Dr Duard Brady on 06-12-12, exam remarkable for distended abdomen with fluid wave and 15 cm fixed mass in pelvis. 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 Neoadjuvant dose dense carbo/taxol was beun 06-21-12 with 3 cycles given thru 08-16-12. She needed neupogen days 2,8 and 16 on this regimen. She had good partial response by CTs 08-19-12 and had improvement in CA 125 from 1041 as baseline for the chemotherapy in late Feb 2014 down to 92 on 08-21-12 after 3 cycles. She had IVC filter placed prior to gyn oncology surgery. She went to exploratory laparotomy with extensive lysis of adhesions, left ureteral lysis, and bilateral salpingo-oophorectomy by Dr Duard Brady at [facility] on 09-10-12, which was suboptimal debulking. Operative findings: diffuse carcinomatosis of the residual omentum involving the entire transverse colon. Transverse colon densely adherent to the anterior abdominal wall.  Mesenteric adenopathy with lymph nodes measuring 1 cm, involving the root of the small bowel mesentery. ~5 centimeter right ovarian mass and ~ 6 cm left ovarian mass. Small bowel densely adherent to the pelvis. 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 She resumed chemotherapy with dose dense taxol carboplatin on 09-27-12, completing cycle 5 on 11-08-12. CA 125 increased  from 92 in April to 130 post op in June, then 225 late July and repeat 318. CT CAP done 11-19-12 showed increase in nodular thickening along falciform ligament and small peritoneal nodule right iliac fossa, no ascites, liver ok, no pulmonary emboli, pulmonary nodules or cardiac concerns. Dr Duard Brady recommended change in chemotherapy to doxil with avastin, however echocardiogram 11-27-12 unexpectedly had EF of 15%.  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 Platelets were low in hospital consistent with gemcitabine given 12-02-12; HIT returned negative. She had further cardiology evaluation in hospital by Dr Verdis Prime.    Power PICC was placed 12-26-12 due to peripheral vein irritation from cycle 1 gemzar, and she was stable to resume gemzar with cycle 2 at 800 mg/m2 given on 12-30-12. Platelets dropped to 70k by day 8.  She had progressive superficial thrombophlebitis on coumadin by 12-25-12, changed then to lovenox, with rapid improvement in those symptoms. Lovenox was held 9-16 until 9-22 due to chemo related thrombocytopenia; she had been back on lovenox full dose from 9-22 until this admission.  She had cycle 3 gemzar on 01-13-13, dose reduced to 600 mg/m2 due to thrombocytopenia cycle 2. CA 125 has continued to increase on gemzar, tho rate of rise may have begun to level off. CA 125 was 225 in July, 1231 in August, 1958 in early Sept and 2223 on 01-13-13.     Objective: Vital signs in last 24 hours: Blood pressure 106/65, pulse 73, temperature 98.8 F (37.1 C), temperature source Oral, resp. rate 20, height 5\' 7"  (1.702 m), weight 137 lb 12.8 oz (62.506 kg), SpO2  100.00%.   Intake/Output from previous day: 10/01 0701 - 10/02 0700 In: 240 [P.O.:240] Out: 200 [Urine:200] Intake/Output this shift: Total I/O In: 300 [P.O.:300] Out: -   Physical exam: Alert, appears comfortable, able to change position easily. Speech mostly fluent, seems at recent baseline. Alopecia. PERRL. Oral mucosa moist and clear. Lungs without wheezes or rales, respirations not labored RA. Heart RRR, no gallop. No supraclavicular adenopathy. Abdomen soft, not tender, not distended, some BS. LE no pitting edema or tender cords, with large varicosities bilaterally and discoloration. Skin otherwise with large ecchymosis left mid abdomen at site of lovenox injection, centrally firm but not tender. No petechiae. No other ecchymoses.  Lab Results:  Recent Labs  01/22/13 0755 01/23/13 0250  WBC 5.2 5.1  HGB 9.8* 9.0*  HCT 29.0* 26.5*  PLT 91* 90*   BMET  Recent Labs  01/21/13 1140 01/21/13 1147 01/22/13 1600  NA 135 139  --   K 3.9 4.0 3.8  CL 100 102  --   CO2 24  --   --   GLUCOSE 98 97  --   BUN 12 12  --   CREATININE 0.64 0.80  --   CALCIUM 9.6  --   --     Hypercoagulable panel results to date without other etiology for recurrent clotting: AT3 105, protein C total 83, protein S activity 110, protein S  total 117, lupus anticoagulant negative, B2 glycoprotein 0-2, homocysteine 10.3, factor V Leiden negative, prothrombin gene mutation negative.   Studies/Results: Ct Head Wo Contrast  01/21/2013   CLINICAL DATA:  Slurred speech.  EXAM: CT HEAD WITHOUT CONTRAST  TECHNIQUE: Contiguous axial images were obtained from the base of the skull through the vertex without intravenous contrast.  COMPARISON:  CT 12/06/2012  FINDINGS: Generalized atrophy. Mild ventricular enlargement, stable.  Chronic infarct left cerebellum is unchanged. No acute infarct. Negative for hemorrhage or mass lesion.  IMPRESSION: No acute abnormality and no change from the prior CT.   Electronically  Signed   By: Marlan Palau M.D.   On: 01/21/2013 12:38   Mr Brain Wo Contrast  01/21/2013   CLINICAL DATA:  67 year old female with sudden onset of loss of speech. Recent stroke, last month. History of ovarian cancer.  EXAM: MRI HEAD WITHOUT CONTRAST  TECHNIQUE: Multiplanar, multisequence MR imaging was performed. No intravenous contrast was administered.  COMPARISON:  Head CT without contrast 01/21/2013. Brain MRI 12/05/2012.  FINDINGS: Major intracranial vascular flow voids are stable and within normal limits.  Regressed diffusion signal abnormality in the left hemisphere. No definite residual restricted diffusion. Developing gliosis/encephalomalacia at the frontal operculum (series 6, images 16 and 17).  Scattered small mostly cortically based areas of restricted diffusion in the superior right hemisphere have in part regressed, but could some new areas of involvement which do appear restricted are identified (series 3, image 24 and series 300, image 24) mild associated T2 and FLAIR hyperintensity. No associated mass effect or hemorrhage. No chronic blood products identified at the areas of previous insult.  No deep gray matter or brainstem and diffusion abnormality.  Regressed diffusion abnormality in the cerebellum. Evolved linear infarcts in the left cerebellum. In the right hemisphere there is a small linear area of diffusion abnormality which appears mildly restricted (series 3, image 9 and series 300, image 9). No associated mass effect or hemorrhage.  Stable ventricle size and configuration. No midline shift, mass effect, or evidence of intracranial mass lesion. No acute intracranial hemorrhage identified. Negative pituitary, cervicomedullary junction, and visualized cervical spine. Normal bone marrow signal. Stable cerebral volume.  Visualized orbit soft tissues are within normal limits. Visualized paranasal sinuses and mastoids are clear. Normal visible internal auditory structures. Negative scalp  soft tissues.  IMPRESSION: 1. Continued development of small infarcts in the right peri-Rolandic cortex and right cerebellar hemisphere. Several small infarcts appear to be acute to early subacute. No associated mass effect or hemorrhage.  2. Otherwise regressed diffusion abnormality in the brain since 12/05/2012. Expected evolution of the previously-seen larger infarcts, including at the left frontal operculum and left cerebellar hemisphere.   Electronically Signed   By: Augusto Gamble M.D.   On: 01/21/2013 13:43      I have discussed situation at length with patient, and have reviewed history as compiled above in person with Dr Lendell Caprice. I will discuss also with Dr Duard Brady. I feel that  recurrent CVAs are more likely to be acutely devastating than the gyn cancer, particularly as these have now recurred despite full dose lovenox. The cancer has recently been more resistant to chemotherapy, with choices of drugs constrained by cardiac issues and concern re thrombocytopenia with necessary anticoagulation. I have talked with her about using letrozole instead of further chemotherapy now, including mechanism of action of that drug and possible side effects; I will discuss this also with Dr Duard Brady. Patient has completed Advance Directives, with friend Rosanne Ashing as  HCPOA. Code status this admission is listed as Full Code. Per my conversation with her now, patient does want intervention for potentially reversible problems but would not want resuscitation/ life support in an irreversible situation. She is still hoping to have some good quality time remaining. I have discussed this with Dr Lendell Caprice [facility] Rosanne Ashing not at hospital now).  From standpoint of chemotherapy, patient can have flu vaccine prior to DC from hospital.  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 Post first cycle gemzar on 12-02-12, cycle 2 gemzar  12-30-12 complicated by low platelets, now day 11 cycle 3. Platelets 90K today, not clear if she is at nadir; other counts ok. Follow daily CBC. 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 IVC filter in. Underlying etiology likely the advanced gyn cancer. Very difficult situation with no other good options for control. I appreciate Dr Magrinat's input yesterday. 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 Follow up with Dr Katrinka Blazing.  4.long tobacco now discontinued.  5.Advance Directives and HCPOA completed: see discussion above. With advanced, resistant cancer, multiple CVAs and cardiac disease, I doubt life support would be beneficial overall. 6.power PICC in 7. OK for flu vaccine prior to DC   I will see her again 01-24-13. Please call if needed between rounds [phone]  Riad Wagley P

## 2013-01-23 NOTE — Progress Notes (Signed)
ANTICOAGULATION CONSULT NOTE - Follow Up Consult  Pharmacy Consult for Heparin  Indication: acute stroke  Allergies  Allergen Reactions  . Septra [Sulfamethoxazole-Tmp Ds] Rash and Other (See Comments)    High fever, Burning skin  . Ampicillin Itching  . Fentanyl And Related Hives    Per Dr. Acey Lav, pt reports she is ok with fentanyl (09/10/12). Patient states it was epidural form of Fentanyl that she reacted to.   . Neosporin [Neomycin-Bacitracin Zn-Polymyx] Swelling and Other (See Comments)    opthalmic solution only can use topically. Swelling and inflamed eyes   Labs:  Recent Labs  01/21/13 1140 01/21/13 1147 01/21/13 1928 01/22/13 0138 01/22/13 0755 01/22/13 1600 01/23/13 0250  HGB 9.4* 12.9  --   --  9.8*  --  9.0*  HCT 28.6* 38.0  --   --  29.0*  --  26.5*  PLT 102*  --   --   --  91*  --  90*  APTT 29  --   --   --   --   --   --   LABPROT 13.3  --   --   --   --   --  14.0  INR 1.03  --   --   --   --   --  1.10  HEPARINUNFRC  --   --   --   --  0.16* 0.15* 0.40  CREATININE 0.64 0.80  --   --   --   --   --   TROPONINI 0.43*  --  0.41* 0.51* <0.30  --   --     Estimated Creatinine Clearance: 68.2 ml/min (by C-G formula based on Cr of 0.8).  Assessment: 66yof with history of ovarian CA, previous stroke 8/14, DVT s/p IVC filter was admitted with an acute embolic stroke despite compliance with lovenox pta. She was transitioned to IV heparin yesterday evening.   Heparin level therapeutic  Goal of Therapy:  Heparin level 0.3-0.5 units/ml Monitor platelets by anticoagulation protocol: Yes    Plan:  1) Cont heparin at 1250 units/hr 2) 6 hour heparin level to confirm   Thank you. Talbert Cage, PharmD [phone]

## 2013-01-23 NOTE — Progress Notes (Signed)
Pt's bp 88/43 while resting in bed.  Pt is asymptomatic. Np on call made aware.Will cont to monitor pt.

## 2013-01-24 DIAGNOSIS — F17200 Nicotine dependence, unspecified, uncomplicated: Secondary | ICD-10-CM

## 2013-01-24 DIAGNOSIS — D6859 Other primary thrombophilia: Secondary | ICD-10-CM

## 2013-01-24 LAB — CBC
MCV: 89.7 fL (ref 78.0–100.0)
Platelets: 110 10*3/uL — ABNORMAL LOW (ref 150–400)
RBC: 3 MIL/uL — ABNORMAL LOW (ref 3.87–5.11)
RDW: 17.5 % — ABNORMAL HIGH (ref 11.5–15.5)
WBC: 5.1 10*3/uL (ref 4.0–10.5)

## 2013-01-24 LAB — HEPARIN LEVEL (UNFRACTIONATED): Heparin Unfractionated: 0.4 IU/mL (ref 0.30–0.70)

## 2013-01-24 LAB — PROTIME-INR
INR: 1.07 (ref 0.00–1.49)
Prothrombin Time: 13.7 seconds (ref 11.6–15.2)

## 2013-01-24 LAB — HEPARIN INDUCED THROMBOCYTOPENIA PNL: UFH High Dose UFH H: 0 % Release

## 2013-01-24 MED ORDER — WARFARIN SODIUM 7.5 MG PO TABS
7.5000 mg | ORAL_TABLET | Freq: Once | ORAL | Status: AC
  Administered 2013-01-24: 7.5 mg via ORAL
  Filled 2013-01-24: qty 1

## 2013-01-24 NOTE — Progress Notes (Signed)
Medical Oncology 01/24/2013, 11:00 AM  Hospital day 4 Antibiotics: none Chemotherapy: cycle 3 gemzar 01-13-13  Patient seen, alone for visit. EMR reviewed since my visit on 01-23-13  Subjective: Feeling well overall today. No new neurologic symptoms, no bleeding, has been walking in hall. No abdominal or pelvic discomfort. Appetite good. PICC working well for blood draws and continuous heparin via peripheral IV.  Objective: Vital signs in last 24 hours: Blood pressure 102/56, pulse 78, temperature 97.6 F (36.4 C), temperature source Oral, resp. rate 18, height 5\' 7"  (1.702 m), weight 137 lb 12.8 oz (62.506 kg), SpO2 97.00%.   Intake/Output from previous day: 10/02 0701 - 10/03 0700 In: 300 [P.O.:300] Out: -  Intake/Output this shift:    Physical exam: awake, alert, oriented and appropriate. Very clear about treatment details during conversation now. PERRL, not icteric. Oral mucosa moist and clear. Respirations not labored RA. Lungs clear. Heart RRR. PICC dressing clean, no tenderness, swelling or erythema. Peripheral IV left distal forearm site ok. Abdomen soft, some BS, not distended, not tender. LE no pitting edema, one firm area in superficial varicosity left calf which is not tender or warm/ erythematous, otherwise legs are as good as I have seen them. Moves easily in bed. Speech more fluent, very little hesitation today.  Lab Results:  Recent Labs  01/23/13 0250 01/24/13 0530  WBC 5.1 5.1  HGB 9.0* 8.9*  HCT 26.5* 26.9*  PLT 90* 110*  Counts appear past nadir from most recent chemotherapy with platelets increased today.  BMET  Recent Labs  01/21/13 1140 01/21/13 1147 01/22/13 1600  NA 135 139  --   K 3.9 4.0 3.8  CL 100 102  --   CO2 24  --   --   GLUCOSE 98 97  --   BUN 12 12  --   CREATININE 0.64 0.80  --   CALCIUM 9.6  --   --    INR today 1.0 and heparin level 0.4  Studies/Results: No results found.  Medications: reviewed in EMR. 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 Post first cycle gemzar on 12-02-12, cycle 2 gemzar 12-30-12 complicated by low platelets, now day 12 cycle 3 with platelets recovering. Follow daily CBC for now. Decision re further systemic treatment pending discussion with Dr Duard Brady. 2.Hypercoagulable state likely due to the gyn cancer, with superficial thrombophlebitis and DVTs on presentation at which time she was started on xarelto. IVC filter was placed prior to gyn onc surgery. First embolic CVAs occurred while on xarelto, treated with heparin and transition to coumadin. While on coumadin she had progressive superficial thrombophlebitis, changed at that point to lovenox. Most recent embolic CVAs occurred on lovenox (1.5 mg/kg daily dose). Patient understands that she is at high risk for further clotting; certainly we need to maintain anticoagulation and avoid chemo induced thrombocytopenia. Tho it may not be any more effective than other regimens, SQ heparin would be a consideration. I have asked pharmacist to discuss with her. 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  Dr Katrinka Blazing aware of situation and she appreciated his visit.  4.long tobacco now discontinued.  5.Advance Directives and HCPOA completed: she does not want to be maintained on life  support in irreversible situation. With advanced, resistant cancer, multiple CVAs and cardiac disease, I doubt life support would be beneficial overall.  6.power PICC in  7. OK for flu vaccine prior to DC  Please call over weekend if medical oncology can assist [phone] and I will follow up Mon if still in hospital  LIVESAY,LENNIS  P

## 2013-01-24 NOTE — Progress Notes (Signed)
PHARMACY FOLLOW UP NOTE   Pharmacy Consult for : Coumadin with Heparin bridging  Indication: CVA, Hypercoagulable State with history of Ovarian Cancer  Dosing Weight: 62 kg  Labs:  Recent Labs  01/21/13 1140 01/21/13 1147  01/22/13 0755 01/22/13 1600 01/23/13 0250 01/23/13 0940 01/23/13 2234 01/24/13 0530  HGB 9.4* 12.9  --  9.8*  --  9.0*  --   --  8.9*  HCT 28.6* 38.0  --  29.0*  --  26.5*  --   --  26.9*  PLT 102*  --   --  91*  --  90*  --   --  110*  APTT 29  --   --   --   --   --   --   --   --   LABPROT 13.3  --   --   --   --  14.0  --   --  13.7  INR 1.03  --   --   --   --  1.10  --   --  1.07  HEPARINUNFRC  --   --   < > 0.16* 0.15* 0.40 0.26* 0.26* 0.40  CREATININE 0.64 0.80  --   --   --   --   --   --   --   < > = values in this interval not displayed. Lab Results  Component Value Date   INR 1.07 01/24/2013   INR 1.10 01/23/2013   INR 1.03 01/21/2013    Estimated Creatinine Clearance: 68.2 ml/min (by C-G formula based on Cr of 0.8).  Pertinent Medications:  Scheduled:  . lisinopril  2.5 mg Oral Daily  . metoprolol succinate  12.5 mg Oral BID  . pantoprazole  40 mg Oral Daily  . spironolactone  25 mg Oral Daily  . warfarin   Does not apply Once  . Warfarin - Pharmacist Dosing Inpatient   Does not apply q1800   Infusions:  . heparin 1,500 Units/hr (01/24/13 0034)   Assessment:  67 yo F admitted 01/21/2013 with CVA in setting of presumed hypercoagulable state due to Ovarian CA  despite treatment with enoxaparin PTA. CT and MR negative for hemorrhage.  INR remains at baseline, Heparin level within therapeutic goals.  No bleeding complications noted   Goal:  INR 2-3  Heparin level 0.3 - 0.5   Plan: 1. Continue Heparin at 1500 units/hr. 2. Coumadin 7.5 mg today. 3. Coumadin discharge education completed.   Aariana Shankland, Deetta Perla.D 01/24/2013, 9:48 AM

## 2013-01-24 NOTE — Progress Notes (Signed)
SOCIAL visit.  The patient is in good spirits.  Her diuretic is being held. Please be careful with IV fluid. Restore cardiac therapy to OP regimen when possible and certainly at discharge.

## 2013-01-24 NOTE — Progress Notes (Signed)
[facility] PROGRESS NOTE  SUNNY AGUON UEA:[id number] DOB: Jul 23, 1945 DOA: 01/21/2013 PCP: Aura Dials, MD  Assessment/Plan:  Acute ischemic stroke related to hypercoaguable state.  onc and neuro rec warfarin and iv heparin.  Agree with neuro's plans to keep patient in hospital on heparin drip until therapeutic warfarin, and due to patient's complex course, recurrent embolic events, thrombocytopenia, etc. still subtherapeutic.   Ovarian cancer, advanced.  No further gemzar per Dr. Darrold Span.  Prognosis guarded, as cancer has progressed on treatment.     Hypercoagulopathy   Thrombocytopenia, secondary to chemotherapy:  HIT panel last admission negative. Improving.   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 Electrolytes okay. No need to continue to monitor. Will discontinue telemetry. Minimally elevated troponin.  Appears chronic.  No chest pain. No evidence of ACS.  No further w/u  HPI/Subjective: No dyspnea. No new complaints. No dizziness.  Objective: Filed Vitals:   01/24/13 0400  BP: 102/56  Pulse: 78  Temp: 97.6 F (36.4 C)  Resp: 18   No intake or output data in the 24 hours ending 01/24/13 1430 Filed Weights   01/21/13 2100  Weight: 62.506 kg (137 lb 12.8 oz)    Exam:   General:  Alert, oriented  Neck: No JVD.  Cardiovascular: RRR without MGR  Respiratory: CTA without WRR  Abdomen: S, NT, ND  Ext: no CCE  Neuro:  Speech clear and fluent   Data Reviewed: Basic Metabolic Panel:  Recent Labs Lab 01/21/13 1140 01/21/13 1147 01/22/13 1600  NA 135 139  --   K 3.9 4.0 3.8  CL 100 102  --   CO2 24  --   --   GLUCOSE 98 97  --   BUN 12 12  --   CREATININE 0.64 0.80  --   CALCIUM 9.6  --   --   MG  --   --  1.9   Liver Function Tests:  Recent Labs Lab 01/21/13 1140   AST 31  ALT 25  ALKPHOS 110  BILITOT 0.2*  PROT 7.3  ALBUMIN 3.2*   No results found for this basename: LIPASE, AMYLASE,  in the last 168 hours No results found for this basename: AMMONIA,  in the last 168 hours CBC:  Recent Labs Lab 01/21/13 1140 01/21/13 1147 01/22/13 0755 01/23/13 0250 01/24/13 0530  WBC 4.6  --  5.2 5.1 5.1  NEUTROABS 3.1  --   --   --   --   HGB 9.4* 12.9 9.8* 9.0* 8.9*  HCT 28.6* 38.0 29.0* 26.5* 26.9*  MCV 88.3  --  87.9 89.2 89.7  PLT 102*  --  91* 90* 110*   Cardiac Enzymes:  Recent Labs Lab 01/21/13 1140 01/21/13 1928 01/22/13 0138 01/22/13 0755  TROPONINI 0.43* 0.41* 0.51* <0.30   BNP (last 3 results)  Recent Labs  12/05/12 1417  PROBNP 1659.0*   CBG: No results found for this basename: GLUCAP,  in the last 168 hours  No results found for this or any previous visit (from the past 240 hour(s)).   Studies: No results found.  Scheduled Meds: . lisinopril  2.5 mg Oral Daily  . metoprolol succinate  12.5 mg Oral BID  . pantoprazole  40 mg Oral Daily  . spironolactone  25 mg Oral Daily  . warfarin  7.5 mg Oral ONCE-1800  .  warfarin   Does not apply Once  . Warfarin - Pharmacist Dosing Inpatient   Does not apply q1800   Continuous Infusions: . heparin 1,500 Units/hr (01/24/13 0034)    15 min  Jourdyn Hasler L  [facility] Pager [phone]. If 7PM-7AM, please contact night-coverage at [email or web address], password [facility] 01/24/2013, 2:30 PM  LOS: 3 days

## 2013-01-25 LAB — CBC
HCT: 26.1 % — ABNORMAL LOW (ref 36.0–46.0)
MCHC: 32.6 g/dL (ref 30.0–36.0)
Platelets: 128 10*3/uL — ABNORMAL LOW (ref 150–400)
RBC: 2.91 MIL/uL — ABNORMAL LOW (ref 3.87–5.11)
RDW: 17.6 % — ABNORMAL HIGH (ref 11.5–15.5)
WBC: 5.1 10*3/uL (ref 4.0–10.5)

## 2013-01-25 LAB — PROTIME-INR
INR: 1.17 (ref 0.00–1.49)
Prothrombin Time: 14.7 seconds (ref 11.6–15.2)

## 2013-01-25 MED ORDER — METOPROLOL SUCCINATE 12.5 MG HALF TABLET
12.5000 mg | ORAL_TABLET | Freq: Two times a day (BID) | ORAL | Status: DC
  Administered 2013-01-25 – 2013-01-28 (×6): 12.5 mg via ORAL
  Filled 2013-01-25 (×7): qty 1

## 2013-01-25 MED ORDER — WARFARIN SODIUM 10 MG PO TABS
10.0000 mg | ORAL_TABLET | Freq: Once | ORAL | Status: AC
  Administered 2013-01-25: 10 mg via ORAL
  Filled 2013-01-25: qty 1

## 2013-01-25 NOTE — Progress Notes (Signed)
Anticoagulation Pharmacy Follow Up Note  Pharmacy Consult for : Coumadin with Heparin bridging  Indication: CVA, Hypercoagulable State with history of Ovarian Cancer  Dosing Weight: 62 kg  Labs:  Recent Labs  01/23/13 0250 01/23/13 0940 01/23/13 2234 01/24/13 0530 01/25/13 0520  HGB 9.0*  --   --  8.9* 8.5*  HCT 26.5*  --   --  26.9* 26.1*  PLT 90*  --   --  110* 128*  LABPROT 14.0  --   --  13.7 14.7  INR 1.10  --   --  1.07 1.17  HEPARINUNFRC 0.40 0.26* 0.26* 0.40 0.61   Lab Results  Component Value Date   INR 1.17 01/25/2013   INR 1.07 01/24/2013   INR 1.10 01/23/2013    Estimated Creatinine Clearance: 68.2 ml/min (by C-G formula based on Cr of 0.8).  Pertinent Medications:  Scheduled:  . lisinopril  2.5 mg Oral Daily  . metoprolol succinate  12.5 mg Oral BID  . pantoprazole  40 mg Oral Daily  . spironolactone  25 mg Oral Daily  . warfarin   Does not apply Once  . Warfarin - Pharmacist Dosing Inpatient   Does not apply q1800   Infusions:  . heparin 1,500 Units/hr (01/25/13 1105)   Assessment:  67 yo F admitted 01/21/2013 with CVA in setting of presumed hypercoagulable state due to ovarian CA despite treatment with enoxaparin PTA. CT and MR negative for hemorrhage.  Patient was on PTA enoxaparin. History of DVT on anticoagulation and s/p IVC filter.   INR remains slow to respond to warfarin and remains subtherapeutic. INR slightly increased to 1.17 from 1.07 after dose increase last night.  Heparin level therapeutic at 0.61 today.  No bleeding complications noted   Coumadin discharge education noted to have been completed.   Goal:  INR 2-3  Heparin level 0.3 - 0.5   Plan: 1. Continue Heparin at 1500 units/hr. 2. Coumadin 10 mg x1 today. 3. Daily heparin levels, INR/PT, CBC  Aarica Wax C. Jovann Luse, PharmD Clinical Pharmacist-Resident Pager: [phone] Pharmacy: [phone] 01/25/2013 11:32 AM

## 2013-01-25 NOTE — Progress Notes (Signed)
[facility] PROGRESS NOTE  DEMARIA DEENEY WJX:[id number] DOB: 08/20/1945 DOA: 01/21/2013 PCP: Aura Dials, MD  Assessment/Plan:  Acute ischemic stroke related to hypercoaguable state.  onc and neuro rec warfarin and iv heparin.  Agree with neuro's plans to keep patient in hospital on heparin drip until therapeutic warfarin, and due to patient's complex course, recurrent embolic events, thrombocytopenia, etc. still subtherapeutic.   Ovarian cancer, advanced.  No further gemzar per Dr. Darrold Span.  Prognosis guarded, as cancer has progressed on treatment.     Hypercoagulopathy   Thrombocytopenia, secondary to chemotherapy:  HIT panel last admission negative. Improving.   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 Electrolytes okay. No need to continue to monitor. Will discontinue telemetry. Minimally elevated troponin.  Appears chronic.  No chest pain. No evidence of ACS.  No further w/u  HPI/Subjective: No dyspnea. No new complaints. No dizziness.  Objective: Filed Vitals:   01/25/13 0431  BP: 99/53  Pulse: 55  Temp: 98.5 F (36.9 C)  Resp: 18    Intake/Output Summary (Last 24 hours) at 01/25/13 1356 Last data filed at 01/24/13 1857  Gross per 24 hour  Intake    300 ml  Output      0 ml  Net    300 ml   Filed Weights   01/21/13 2100  Weight: 62.506 kg (137 lb 12.8 oz)    Exam:   General:  Alert, oriented  Neck: No JVD.  Cardiovascular: RRR without MGR  Respiratory: CTA without WRR  Abdomen: S, NT, ND  Ext: no CCE  Neuro:  Speech clear and fluent   Data Reviewed: Basic Metabolic Panel:  Recent Labs Lab 01/21/13 1140 01/21/13 1147 01/22/13 1600  NA 135 139  --   K 3.9 4.0 3.8  CL 100 102  --   CO2 24  --   --   GLUCOSE 98 97  --   BUN 12 12  --   CREATININE 0.64 0.80  --    CALCIUM 9.6  --   --   MG  --   --  1.9   Liver Function Tests:  Recent Labs Lab 01/21/13 1140  AST 31  ALT 25  ALKPHOS 110  BILITOT 0.2*  PROT 7.3  ALBUMIN 3.2*   No results found for this basename: LIPASE, AMYLASE,  in the last 168 hours No results found for this basename: AMMONIA,  in the last 168 hours CBC:  Recent Labs Lab 01/21/13 1140 01/21/13 1147 01/22/13 0755 01/23/13 0250 01/24/13 0530 01/25/13 0520  WBC 4.6  --  5.2 5.1 5.1 5.1  NEUTROABS 3.1  --   --   --   --   --   HGB 9.4* 12.9 9.8* 9.0* 8.9* 8.5*  HCT 28.6* 38.0 29.0* 26.5* 26.9* 26.1*  MCV 88.3  --  87.9 89.2 89.7 89.7  PLT 102*  --  91* 90* 110* 128*   Cardiac Enzymes:  Recent Labs Lab 01/21/13 1140 01/21/13 1928 01/22/13 0138 01/22/13 0755  TROPONINI 0.43* 0.41* 0.51* <0.30   BNP (last 3 results)  Recent Labs  12/05/12 1417  PROBNP 1659.0*   CBG: No results found for this basename: GLUCAP,  in the last 168 hours  No results found for this or any previous visit (from the past 240 hour(s)).   Studies: No results found.  Scheduled Meds: . lisinopril  2.5 mg Oral Daily  . metoprolol succinate  12.5 mg Oral BID  . pantoprazole  40 mg Oral Daily  . spironolactone  25 mg Oral Daily  . warfarin  10 mg Oral ONCE-1800  . warfarin   Does not apply Once  . Warfarin - Pharmacist Dosing Inpatient   Does not apply q1800   Continuous Infusions: . heparin 1,500 Units/hr (01/25/13 1105)    15 min  Jamine Highfill L  [facility] Pager [phone]. If 7PM-7AM, please contact night-coverage at [email or web address], password [email or web address] 01/25/2013, 1:56 PM  LOS: 4 days

## 2013-01-25 NOTE — Progress Notes (Signed)
Metoprolol held d/t low bp. Will continue to monitor the pt. Sanda Linger, RN

## 2013-01-26 LAB — CBC
Hemoglobin: 8.4 g/dL — ABNORMAL LOW (ref 12.0–15.0)
MCH: 29.8 pg (ref 26.0–34.0)
MCHC: 32.9 g/dL (ref 30.0–36.0)
RBC: 2.82 MIL/uL — ABNORMAL LOW (ref 3.87–5.11)
RDW: 17.8 % — ABNORMAL HIGH (ref 11.5–15.5)

## 2013-01-26 LAB — HEPARIN LEVEL (UNFRACTIONATED): Heparin Unfractionated: 0.52 IU/mL (ref 0.30–0.70)

## 2013-01-26 LAB — PROTIME-INR: Prothrombin Time: 16.4 seconds — ABNORMAL HIGH (ref 11.6–15.2)

## 2013-01-26 MED ORDER — WARFARIN SODIUM 10 MG PO TABS
10.0000 mg | ORAL_TABLET | Freq: Once | ORAL | Status: AC
  Administered 2013-01-26: 10 mg via ORAL
  Filled 2013-01-26: qty 1

## 2013-01-26 NOTE — Progress Notes (Signed)
Anticoagulation Pharmacy Follow Up Note  Pharmacy Consult for : Coumadin with Heparin bridging  Indication: CVA, Hypercoagulable State with history of Ovarian Cancer  Dosing Weight: 62 kg  Labs:  Recent Labs  01/23/13 0940 01/23/13 2234 01/24/13 0530 01/25/13 0520 01/26/13 0500  HGB  --   --  8.9* 8.5* 8.4*  HCT  --   --  26.9* 26.1* 25.5*  PLT  --   --  110* 128* 167  LABPROT  --   --  13.7 14.7 16.4*  INR  --   --  1.07 1.17 1.36  HEPARINUNFRC 0.26* 0.26* 0.40 0.61 0.52   Lab Results  Component Value Date   INR 1.36 01/26/2013   INR 1.17 01/25/2013   INR 1.07 01/24/2013    Estimated Creatinine Clearance: 68.2 ml/min (by C-G formula based on Cr of 0.8).  Pertinent Medications:  Scheduled:  . lisinopril  2.5 mg Oral Daily  . metoprolol succinate  12.5 mg Oral BID  . pantoprazole  40 mg Oral Daily  . spironolactone  25 mg Oral Daily  . warfarin   Does not apply Once  . Warfarin - Pharmacist Dosing Inpatient   Does not apply q1800   Infusions:  . heparin 1,500 Units/hr (01/26/13 0353)   Assessment:  67 yo F admitted 01/21/2013 with CVA in setting of presumed hypercoagulable state due to ovarian CA despite treatment with enoxaparin PTA. CT and MR negative for hemorrhage.  Patient was on PTA enoxaparin. History of DVT on anticoagulation and s/p IVC filter.   INR slow to respond to warfarin and remains subtherapeutic. INR increased to 1.36 from 1.17 after dose increase last night.  Heparin level therapeutic at 0.52 today.  No bleeding complications noted. Platelets improving (128 > 167). Hgb stable.   Coumadin discharge education noted to have been completed.   Goal:  INR 2-3  Heparin level 0.3 - 0.5   Plan: 1. Continue Heparin at 1500 units/hr. 2. Coumadin 10 mg x1 today. 3. Daily heparin levels, INR/PT, CBC 4. Monitor for bleeding complications.  Bernece Gall C. Kaislee Chao, PharmD Clinical Pharmacist-Resident Pager: [phone] Pharmacy: [phone] 01/26/2013  8:34 AM

## 2013-01-26 NOTE — Progress Notes (Signed)
[facility] PROGRESS NOTE  GENESIS NOVOSAD ZOX:[id number] DOB: 13-Jan-1946 DOA: 01/21/2013 PCP: Aura Dials, MD  Assessment/Plan:  Acute ischemic stroke related to hypercoaguable state.  onc and neuro rec warfarin and iv heparin.  Agree with neuro's plans to keep patient in hospital on heparin drip until therapeutic warfarin, and due to patient's complex course, recurrent embolic events, thrombocytopenia, etc. still subtherapeutic.  10 mg coumadin today   Ovarian cancer, advanced.  No further gemzar per Dr. Darrold Span.  Prognosis guarded, as cancer has progressed on treatment.     Hypercoagulopathy   Thrombocytopenia, secondary to chemotherapy, resolved:  HIT panel last admission negative.    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  Appears chronic.  No chest pain. No evidence of ACS.  No further w/u  HPI/Subjective: No dyspnea. No new complaints. No dizziness.  Objective: Filed Vitals:   01/26/13 0500  BP: 100/43  Pulse: 52  Temp: 98.1 F (36.7 C)  Resp: 18    Intake/Output Summary (Last 24 hours) at 01/26/13 1339 Last data filed at 01/26/13 0700  Gross per 24 hour  Intake    660 ml  Output      0 ml  Net    660 ml   Filed Weights   01/21/13 2100  Weight: 62.506 kg (137 lb 12.8 oz)    Exam:   General:  Alert, oriented  Neck: No JVD.  Cardiovascular: RRR without MGR  Respiratory: CTA without WRR  Abdomen: S, NT, ND  Ext: no CCE  Neuro:  Speech clear and fluent   Data Reviewed: Basic Metabolic Panel:  Recent Labs Lab 01/21/13 1140 01/21/13 1147 01/22/13 1600  NA 135 139  --   K 3.9 4.0 3.8  CL 100 102  --   CO2 24  --   --   GLUCOSE 98 97  --   BUN 12 12  --   CREATININE 0.64 0.80  --   CALCIUM 9.6  --   --   MG  --   --  1.9   Liver Function  Tests:  Recent Labs Lab 01/21/13 1140  AST 31  ALT 25  ALKPHOS 110  BILITOT 0.2*  PROT 7.3  ALBUMIN 3.2*   No results found for this basename: LIPASE, AMYLASE,  in the last 168 hours No results found for this basename: AMMONIA,  in the last 168 hours CBC:  Recent Labs Lab 01/21/13 1140  01/22/13 0755 01/23/13 0250 01/24/13 0530 01/25/13 0520 01/26/13 0500  WBC 4.6  --  5.2 5.1 5.1 5.1 5.3  NEUTROABS 3.1  --   --   --   --   --   --   HGB 9.4*  < > 9.8* 9.0* 8.9* 8.5* 8.4*  HCT 28.6*  < > 29.0* 26.5* 26.9* 26.1* 25.5*  MCV 88.3  --  87.9 89.2 89.7 89.7 90.4  PLT 102*  --  91* 90* 110* 128* 167  < > = values in this interval not displayed. Cardiac Enzymes:  Recent Labs Lab 01/21/13 1140 01/21/13 1928 01/22/13 0138 01/22/13 0755  TROPONINI 0.43* 0.41* 0.51* <0.30   BNP (last 3 results)  Recent Labs  12/05/12 1417  PROBNP 1659.0*   CBG: No results found for this basename: GLUCAP,  in the last 168 hours  No results found for this  or any previous visit (from the past 240 hour(s)).   Studies: No results found.  Scheduled Meds: . lisinopril  2.5 mg Oral Daily  . metoprolol succinate  12.5 mg Oral BID  . pantoprazole  40 mg Oral Daily  . spironolactone  25 mg Oral Daily  . warfarin  10 mg Oral ONCE-1800  . warfarin   Does not apply Once  . Warfarin - Pharmacist Dosing Inpatient   Does not apply q1800   Continuous Infusions: . heparin 1,500 Units/hr (01/26/13 0353)    15 min  Yenifer Saccente L  [facility] Pager [phone]. If 7PM-7AM, please contact night-coverage at [email or web address], password [facility] 01/26/2013, 1:39 PM  LOS: 5 days

## 2013-01-27 ENCOUNTER — Other Ambulatory Visit: Payer: Medicare Other | Admitting: Lab

## 2013-01-27 ENCOUNTER — Ambulatory Visit: Payer: Medicare Other

## 2013-01-27 ENCOUNTER — Other Ambulatory Visit: Payer: Self-pay | Admitting: Oncology

## 2013-01-27 DIAGNOSIS — Z86718 Personal history of other venous thrombosis and embolism: Secondary | ICD-10-CM

## 2013-01-27 DIAGNOSIS — I63432 Cerebral infarction due to embolism of left posterior cerebral artery: Secondary | ICD-10-CM

## 2013-01-27 DIAGNOSIS — C569 Malignant neoplasm of unspecified ovary: Secondary | ICD-10-CM

## 2013-01-27 LAB — CBC
Hemoglobin: 8.8 g/dL — ABNORMAL LOW (ref 12.0–15.0)
MCH: 29.4 pg (ref 26.0–34.0)
MCHC: 32.7 g/dL (ref 30.0–36.0)
MCV: 90 fL (ref 78.0–100.0)
Platelets: 207 10*3/uL (ref 150–400)
RBC: 2.99 MIL/uL — ABNORMAL LOW (ref 3.87–5.11)

## 2013-01-27 LAB — HEPARIN LEVEL (UNFRACTIONATED)
Heparin Unfractionated: 0.55 IU/mL (ref 0.30–0.70)
Heparin Unfractionated: 0.49 IU/mL (ref 0.30–0.70)

## 2013-01-27 MED ORDER — INFLUENZA VAC SPLIT QUAD 0.5 ML IM SUSP
0.5000 mL | INTRAMUSCULAR | Status: AC
  Administered 2013-01-28: 0.5 mL via INTRAMUSCULAR
  Filled 2013-01-27: qty 0.5

## 2013-01-27 MED ORDER — WARFARIN SODIUM 10 MG PO TABS
10.0000 mg | ORAL_TABLET | Freq: Once | ORAL | Status: AC
  Administered 2013-01-27: 10 mg via ORAL
  Filled 2013-01-27: qty 1

## 2013-01-27 NOTE — Progress Notes (Signed)
[facility] PROGRESS NOTE  LORANDA MASTEL SWF:[id number] DOB: 1945-07-06 DOA: 01/21/2013 PCP: Aura Dials, MD  Assessment/Plan:  Acute ischemic stroke related to hypercoaguable state. INR still subtherapeutic, but slowly increasing.  10 mg coumadin today   Ovarian cancer, advanced.  No further gemzar per Dr. Darrold Span.  Prognosis guarded, as cancer has progressed on treatment.     Hypercoagulopathy   Thrombocytopenia, secondary to chemotherapy, resolved:  HIT panel last admission negative.    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  Appears chronic.  No chest pain. No evidence of ACS.  No further w/u  HPI/Subjective: No dyspnea. No new complaints. No dizziness. Multiple questions  Objective: Filed Vitals:   01/27/13 0500  BP: 113/45  Pulse: 62  Temp: 97.7 F (36.5 C)  Resp: 16    Intake/Output Summary (Last 24 hours) at 01/27/13 1000 Last data filed at 01/26/13 1829  Gross per 24 hour  Intake    720 ml  Output      0 ml  Net    720 ml   Filed Weights   01/21/13 2100  Weight: 62.506 kg (137 lb 12.8 oz)    Exam:   General:  Alert, oriented  Cardiovascular: RRR without MGR  Respiratory: CTA without WRR  Abdomen: S, NT, ND  Ext: no CCE  Neuro:  Speech clear and fluent   Data Reviewed: Basic Metabolic Panel:  Recent Labs Lab 01/21/13 1140 01/21/13 1147 01/22/13 1600  NA 135 139  --   K 3.9 4.0 3.8  CL 100 102  --   CO2 24  --   --   GLUCOSE 98 97  --   BUN 12 12  --   CREATININE 0.64 0.80  --   CALCIUM 9.6  --   --   MG  --   --  1.9   Liver Function Tests:  Recent Labs Lab 01/21/13 1140  AST 31  ALT 25  ALKPHOS 110  BILITOT 0.2*  PROT 7.3  ALBUMIN 3.2*   No results found for this basename: LIPASE, AMYLASE,  in the last 168 hours No  results found for this basename: AMMONIA,  in the last 168 hours CBC:  Recent Labs Lab 01/21/13 1140  01/23/13 0250 01/24/13 0530 01/25/13 0520 01/26/13 0500 01/27/13 0545  WBC 4.6  < > 5.1 5.1 5.1 5.3 5.4  NEUTROABS 3.1  --   --   --   --   --   --   HGB 9.4*  < > 9.0* 8.9* 8.5* 8.4* 8.8*  HCT 28.6*  < > 26.5* 26.9* 26.1* 25.5* 26.9*  MCV 88.3  < > 89.2 89.7 89.7 90.4 90.0  PLT 102*  < > 90* 110* 128* 167 207  < > = values in this interval not displayed. Cardiac Enzymes:  Recent Labs Lab 01/21/13 1140 01/21/13 1928 01/22/13 0138 01/22/13 0755  TROPONINI 0.43* 0.41* 0.51* <0.30   BNP (last 3 results)  Recent Labs  12/05/12 1417  PROBNP 1659.0*   CBG: No results found for this basename: GLUCAP,  in the last 168 hours  No results found for this or any previous visit (from the past 240 hour(s)).   Studies: No results found.  Scheduled Meds: . lisinopril  2.5 mg Oral Daily  . metoprolol succinate  12.5 mg Oral BID  .  pantoprazole  40 mg Oral Daily  . spironolactone  25 mg Oral Daily  . warfarin   Does not apply Once  . Warfarin - Pharmacist Dosing Inpatient   Does not apply q1800   Continuous Infusions: . heparin 1,500 Units/hr (01/26/13 0353)    15 min  Beverly Ferner L  [facility] Pager [phone]. If 7PM-7AM, please contact night-coverage at [email or web address], password [email or web address] 01/27/2013, 10:00 AM  LOS: 6 days

## 2013-01-27 NOTE — Progress Notes (Addendum)
Anticoagulation Pharmacy Follow Up Note  Pharmacy Consult for : Coumadin with Heparin bridging  Indication: CVA, Hypercoagulable State with history of Ovarian Cancer  Dosing Weight: 62 kg  Labs:  Recent Labs  01/25/13 0520 01/26/13 0500 01/27/13 0545  HGB 8.5* 8.4* 8.8*  HCT 26.1* 25.5* 26.9*  PLT 128* 167 207  LABPROT 14.7 16.4* 20.4*  INR 1.17 1.36 1.80*  HEPARINUNFRC 0.61 0.52 0.55   Lab Results  Component Value Date   INR 1.80* 01/27/2013   INR 1.36 01/26/2013   INR 1.17 01/25/2013    Estimated Creatinine Clearance: 68.2 ml/min (by C-G formula based on Cr of 0.8).  Assessment:  67 yo F admitted 01/21/2013 with CVA in setting of presumed hypercoagulable state due to ovarian CA despite treatment with enoxaparin PTA. CT and MR negative for hemorrhage.  Patient was on PTA enoxaparin. History of DVT on anticoagulation and s/p IVC filter.   INR remains subtherapeutic but nice movement over past 24 hours.   Heparin level supratherapeutic at 0.55 today.  No bleeding complications noted. Platelets up to nl. Hgb remains stable.   Goal:  INR 2-3  Heparin level 0.3 - 0.5   Plan: Decreae Heparin to 1400 units/hr.  F/u 8 hr heparin level Coumadin 10 mg x 1 tonight Daily INR, CBC, and heparin level  Christoper Fabian, PharmD, BCPS Clinical pharmacist, pager [phone] 01/27/2013 11:36 AM   Addendum: Repeat heparin level 0.49 (therapeutic) on 1400 units/hr.   Plan: 1) Continue heparin 1400 units/hr 2) F/u a.m. heparin level  Christoper Fabian, PharmD, BCPS Clinical pharmacist, pager [phone] 01/27/2013   3:43 PM

## 2013-01-27 NOTE — Care Management Note (Unsigned)
    Page 1 of 1   01/27/2013     4:57:05 PM   CARE MANAGEMENT NOTE 01/27/2013  Patient:  Monique [name], Monique [name]   Account Number:  [id number]  Date Initiated:  01/27/2013  Documentation initiated by:  GRAVES-BIGELOW,Mariza Bourget  Subjective/Objective Assessment:   Pt admitted with Acute ischemic stroke related to hypercoaguable state. INR still subtherapeutic, but slowly increasing.     Action/Plan:   CM will continue to monitor for disposition needs.   Anticipated DC Date:  01/29/2013   Anticipated DC Plan:  HOME/SELF CARE      DC Planning Services  CM consult      Choice offered to / List presented to:             Status of service:  In process, will continue to follow Medicare Important Message given?   (If response is "NO", the following Medicare IM given date fields will be blank) Date Medicare IM given:   Date Additional Medicare IM given:    Discharge Disposition:    Per UR Regulation:  Reviewed for med. necessity/level of care/duration of stay  If discussed at Long Length of Stay Meetings, dates discussed:   01/28/2013    Comments:

## 2013-01-27 NOTE — Progress Notes (Signed)
  01/27/2013, 5:51 PM  Hospital day 7 Antibiotics: none Chemotherapy: cycle 3 gemzar 01-13-13  Patient is seen, alone for visit. EMR reviewed.  Subjective: Feeling well overall today. Speech mostly easy, no new neurologic symptoms, no bleeding, has walked in hall multiple times, no GI symptoms, no abdominal or pelvic discomfort. PICC functioning well for blood draws. New IV LUE site ok. Knee high teds comfortable, no worsening symptoms in LE varicosities. Bowels ok.   Objective: Vital signs in last 24 hours: Blood pressure 107/38, pulse 71, temperature 97.7 F (36.5 C), temperature source Oral, resp. rate 18, height 5\' 7"  (1.702 m), weight 137 lb 12.8 oz (62.506 kg), SpO2 99.00%.   Intake/Output from previous day: 10/05 0701 - 10/06 0700 In: 720 [[name].O.:600; I.V.:120] Out: -  Intake/Output this shift:    Physical exam: awake, alert, fully oriented and appropriate. Speech entirely fluent thru extended conversation. No facial droop. Oral mucosa moist and clear. Lungs clear to bases. Heart RRR. Abdomen not distended, normally active BS. PICC site ok RUE. IV site ok left forearm. LE no edema or tenderness, minimally prominent varicosity left calf not tender. Moves all extremities easily.  Lab Results:  Recent Labs  01/26/13 0500 01/27/13 0545  WBC 5.3 5.4  HGB 8.4* 8.8*  HCT 25.5* 26.9*  PLT 167 207   BMET No results found for this basename: NA, K, CL, CO2, GLUCOSE, BUN, CREATININE, CALCIUM,  in the last 72 hours  INR today 1.8  Studies/Results: No results found.   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 Post first cycle gemzar on 12-02-12, cycle 2 gemzar 12-30-12 complicated by low platelets, now day 15 cycle 3 with platelets  recovered.We have discussed careful continuation of gemzar outpatient, with dose further reduced due to platelet nadir cycle 3. Patient prefers this option to oral hormone blockers; I will discuss with Dr Duard Brady. Will have follow up appointment at [facility] within the week of DC. 2.Hypercoagulable state likely due to the gyn cancer, with superficial thrombophlebitis and DVTs on presentation at which time she was started on xarelto. IVC filter was placed prior to gyn onc surgery. First embolic CVAs occurred while on xarelto, treated with heparin and transition to coumadin. While on coumadin she had progressive superficial thrombophlebitis, changed at that point to lovenox. Most recent embolic CVAs occurred on lovenox (1.5 mg/kg daily dose). Plan is to DC IV heparin and continue coumadin with baby ASA when INR therapeutic. If she has further clotting on coumadin + ASA, would consider SQ heparin. She can be followed by [facility] coumadin clinic after DC (expect will need INR ~ 10-9 if DC tomorrow. 3.Cardiomyopathy found unexpectedly on echocardiogram done as baseline for doxil, EF 15-20%. Appreciate Dr Michaelle Copas assistance 4.long tobacco now discontinued.  5.Advance Directives and HCPOA completed: she does not want to be maintained on life support in irreversible situation. With advanced, resistant cancer, multiple CVAs and cardiac disease, I doubt life support would be beneficial overall.  6.power PICC in. This can be flushed at [facility] after discharge (once weekly for this PICC) 7. OK for flu vaccine, which I have ordered now.    Monique [name],Monique [name]

## 2013-01-28 ENCOUNTER — Telehealth: Payer: Self-pay | Admitting: Pharmacist

## 2013-01-28 ENCOUNTER — Encounter: Payer: Self-pay | Admitting: Pharmacist

## 2013-01-28 DIAGNOSIS — I639 Cerebral infarction, unspecified: Secondary | ICD-10-CM | POA: Insufficient documentation

## 2013-01-28 DIAGNOSIS — Z86718 Personal history of other venous thrombosis and embolism: Secondary | ICD-10-CM

## 2013-01-28 LAB — CBC
HCT: 26.5 % — ABNORMAL LOW (ref 36.0–46.0)
Hemoglobin: 8.6 g/dL — ABNORMAL LOW (ref 12.0–15.0)
MCV: 91.1 fL (ref 78.0–100.0)
RBC: 2.91 MIL/uL — ABNORMAL LOW (ref 3.87–5.11)
RDW: 18.3 % — ABNORMAL HIGH (ref 11.5–15.5)
WBC: 5.6 10*3/uL (ref 4.0–10.5)

## 2013-01-28 LAB — PROTIME-INR
INR: 2.39 — ABNORMAL HIGH (ref 0.00–1.49)
Prothrombin Time: 25.3 seconds — ABNORMAL HIGH (ref 11.6–15.2)

## 2013-01-28 LAB — HEPARIN LEVEL (UNFRACTIONATED): Heparin Unfractionated: 0.42 IU/mL (ref 0.30–0.70)

## 2013-01-28 MED ORDER — WARFARIN SODIUM 5 MG PO TABS: ORAL_TABLET | ORAL | Status: DC

## 2013-01-28 MED ORDER — ASPIRIN 81 MG PO TABS: 81.0000 mg | ORAL_TABLET | Freq: Every day | ORAL | Status: DC

## 2013-01-28 NOTE — Progress Notes (Signed)
Pt is ready for DC home, accompanied by a friend. Pt reports she understands all DC instructions, follow up appointments, and new Rx medications. Pt received stroke education.   Torie Warden/ranger

## 2013-01-28 NOTE — Progress Notes (Signed)
Pt known to [facility] pharmacy's Coumadin clinic from previous Coumadin dosing. Recently admitted 01/21/13 to [facility] w/ CVA despite full dose LMWH unfortunately. Pt is being discharged home on Coumadin + ASA. Pt will continue on her chemotherapy (next tx not yet scheduled).  She is a pt of Dr. Darrold Span. During hospital stay, she received the following doses of Coumadin: 01/22/13 - 5 mg 01/23/13 - 5 mg 01/24/13 - 7.5 mg 01/25/13 - 10 mg 01/26/13 - 10 mg 01/27/13 - 10 mg; INR = 1.8 01/28/13 - discharged; INR = 2.39 INR goal = 2-3 Based on her previous doses of Coumadin (with supratherapeutic INR's after discharge after being on 10 mg, 7.5 mg & 5 mg dosing), I will decrease her Coumadin dose to 5 mg today & tomorrow. I called [facility] & left voicemail w/ instructions & appt time for 01/30/13 at 2:15 pm for lab; 2:30 pm for Coumadin clinic. Ebony Hail, Pharm.D., CPP 01/28/2013@1 :28 PM

## 2013-01-28 NOTE — Discharge Summary (Signed)
Physician Discharge Summary  Monique [name] ZOX:[id number] DOB: Oct 13, 1945 DOA: 01/21/2013  PCP: Aura Dials, MD  Admit date: 01/21/2013 Discharge date: 01/28/2013  Time spent: *greater than 30 minutes  Recommendations for Outpatient Follow-up:  1. Monitor INR.  Discharge Diagnoses:  Principal Problem:   Acute ischemic stroke secondary to hypercoaguable state.    Ovarian cancer   Hypercoagulopathy   Thrombocytopenia, unspecified   NICM (nonischemic cardiomyopathy)   History of DVT (deep vein thrombosis)   Chronic systolic heart failure   Dysarthria due to cerebrovascular accident   NSVT (nonsustained ventricular tachycardia)   Discharge Condition: stable  Filed Weights   01/21/13 2100  Weight: 62.506 kg (137 lb 12.8 oz)    History of present illness:  Monique [name] is a 67 y.o. female with complex past medical history significant for advanced age bilaterally and carcinoma on chemotherapy, recent embolic strokes in 8/14, NICM EF of 15%, history of DVT and superficial thrombophlebitis, was started on Coumadin then, subsequently was switched over to Lovenox couple of weeks ago since she developed superficial thrombophlebitis and given benefit of LMW heparin in the setting of cancer. In these last 2 weeks, she had an interruption of due to thrombocytopenia.  Has been taking Lovenox milligrams subcutaneous daily for the last 8 days, yesterday couple hours after her shot, noticed a small swollen subcutaneous area in the left side of her abdomen, along with bruising concerning for hematoma.  She called the cancer Center today and was on the phone with Dr. Precious Reel nurse when she started slurring her speech again and was unable to speak about 3-5 seconds, subsequently asked to come to the ER where an MRI scan noted continued development of small infarct in the right nares rolandic cortex and right cerebellar hemisphere.  Neurology Dr. Thad Ranger consulted and [facility] consulted for further  evaluation management.  Hospital Course:  Acute ischemic stroke related to hypercoaguable state.  Not a candidate for TPA, based on mild severity and resolution of symptoms in ED.  Also acute embolic CVAs 12-04-12 while on xarelto and recurrent now on full dose lovenox: also superficial thrombophlebitis while on coumadin, hx superficial and deep venous thromboses at diagnosis of gyn cancer. IVC filter in. Underlying etiology likely the advanced gyn cancer.   Transferred to [facility].  Neuro and oncology consulted.  Heparin gtt and coumadin recommended. Therapeutic at discharge and Dr. Darrold Span also recommends low dose ASA.  LDL 90.  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 No further gemzar per Dr. Darrold Span. Prognosis guarded, as cancer has progressed on treatment. Please see her progress notes for details  Thrombocytopenia, secondary to chemotherapy, resolved: HIT panel last admission negative.   NICM (nonischemic cardiomyopathy, compensated  History of DVT (deep vein thrombosis) on anticoagulation and post IVC filter   Chronic systolic heart failure: Compensated   NSVT, asymptomatic and chronic:   Minimally elevated troponin. Appears chronic. No chest pain. No evidence of ACS. No further w/u   Dr. Darrold Span discussed with patient Advance Directives and HCPOA completed: she does not want to be maintained on life support in irreversible situation. With advanced, resistant cancer, multiple CVAs and cardiac disease, I doubt life support would be beneficial overall.    Procedures:  none  Consultations:  onc  Neuro  Cardiology made a social visit per patient request  Discharge Exam: Filed Vitals:   01/28/13 0935  BP: 112/45  Pulse: 62  Temp:   Resp:     General: alert, oriented  Cardiovascular: RRR without MGR Respiratory: CTA without WRR  Discharge Instructions  Discharge Orders   Future Appointments Provider Department Dept Phone    02/03/2013 12:00 PM [facility] Nurse [facility] [phone]   02/10/2013 11:00 AM [facility] Nurse [facility] [phone]   02/17/2013 11:15 AM [facility] Nurse [facility] [phone]   02/24/2013 1:30 PM Ronal Fear, NP [facility] [phone]   03/11/2013 11:00 AM Lesleigh Noe, MD [facility] [phone]   Future Orders Complete By Expires   Discharge instructions  As directed    Comments:     Warfarin compatable       Medication List    STOP taking these medications       enoxaparin 100 MG/ML injection  Commonly known as:  LOVENOX      TAKE these medications       aspirin 81 MG tablet  Take 1 tablet (81 mg total) by mouth daily.     lisinopril 2.5 MG tablet  Commonly known as:  PRINIVIL,ZESTRIL  Take 1 tablet (2.5 mg total) by mouth daily.     LORazepam 0.5 MG tablet  Commonly known as:  ATIVAN  Take 0.5 mg by mouth every 8 (eight) hours as needed for anxiety (takes after Chemo treatment).     metoprolol succinate 25 MG 24 hr tablet  Commonly known as:  TOPROL-XL  Take 12.5 mg by mouth 2 (two) times daily.     oxyCODONE-acetaminophen 5-325 MG per tablet  Commonly known as:  PERCOCET/ROXICET  Take 1 tablet by mouth every 4 (four) hours as needed for pain.     pantoprazole 40 MG tablet  Commonly known as:  PROTONIX  Take 1 tablet (40 mg total) by mouth daily.     polyethylene glycol packet  Commonly known as:  MIRALAX / GLYCOLAX  Take 17 g by mouth daily as needed (for constipation).     sennosides-docusate sodium 8.6-50 MG tablet  Commonly known as:  SENOKOT-S  Take 1-2 tablets by mouth daily as needed for constipation.     spironolactone 25 MG tablet  Commonly known as:  ALDACTONE  Take 1 tablet (25 mg total) by mouth daily.     warfarin 5 MG tablet  Commonly known as:  COUMADIN  1.5 (7.5 mg)  tablets nightly or as instructed       Allergies  Allergen Reactions  . Septra [Sulfamethoxazole-Tmp Ds] Rash and Other (See Comments)    High fever, Burning skin  . Ampicillin Itching  . Fentanyl And Related Hives    Per Dr. Acey Lav, pt reports she is ok with fentanyl (09/10/12). Patient states it was epidural form of Fentanyl that she reacted to.   . Neosporin [Neomycin-Bacitracin Zn-Polymyx] Swelling and Other (See Comments)    opthalmic solution only can use topically. Swelling and inflamed eyes       Follow-up Information   Follow up with [facility] On 01/30/2013. (warfarin clinic to check INR and adjust warfarin)       Follow up with Reece Packer, MD.   Specialty:  Oncology   Contact information:   [address] [place] [address] [phone]        The results of significant diagnostics from this hospitalization (including imaging, microbiology, ancillary and laboratory) are listed below for reference.    Significant Diagnostic Studies: Ct Head Wo Contrast  01/21/2013  CLINICAL DATA:  Slurred speech.  EXAM: CT HEAD WITHOUT CONTRAST  TECHNIQUE: Contiguous axial images were obtained from the base of the skull through the vertex without intravenous contrast.  COMPARISON:  CT 12/06/2012  FINDINGS: Generalized atrophy. Mild ventricular enlargement, stable.  Chronic infarct left cerebellum is unchanged. No acute infarct. Negative for hemorrhage or mass lesion.  IMPRESSION: No acute abnormality and no change from the prior CT.   Electronically Signed   By: Marlan Palau M.D.   On: 01/21/2013 12:38   Mr Brain Wo Contrast  01/21/2013   CLINICAL DATA:  67 year old female with sudden onset of loss of speech. Recent stroke, last month. History of ovarian cancer.  EXAM: MRI HEAD WITHOUT CONTRAST  TECHNIQUE: Multiplanar, multisequence MR imaging was performed. No intravenous contrast was administered.  COMPARISON:  Head CT without contrast 01/21/2013. Brain  MRI 12/05/2012.  FINDINGS: Major intracranial vascular flow voids are stable and within normal limits.  Regressed diffusion signal abnormality in the left hemisphere. No definite residual restricted diffusion. Developing gliosis/encephalomalacia at the frontal operculum (series 6, images 16 and 17).  Scattered small mostly cortically based areas of restricted diffusion in the superior right hemisphere have in part regressed, but could some new areas of involvement which do appear restricted are identified (series 3, image 24 and series 300, image 24) mild associated T2 and FLAIR hyperintensity. No associated mass effect or hemorrhage. No chronic blood products identified at the areas of previous insult.  No deep gray matter or brainstem and diffusion abnormality.  Regressed diffusion abnormality in the cerebellum. Evolved linear infarcts in the left cerebellum. In the right hemisphere there is a small linear area of diffusion abnormality which appears mildly restricted (series 3, image 9 and series 300, image 9). No associated mass effect or hemorrhage.  Stable ventricle size and configuration. No midline shift, mass effect, or evidence of intracranial mass lesion. No acute intracranial hemorrhage identified. Negative pituitary, cervicomedullary junction, and visualized cervical spine. Normal bone marrow signal. Stable cerebral volume.  Visualized orbit soft tissues are within normal limits. Visualized paranasal sinuses and mastoids are clear. Normal visible internal auditory structures. Negative scalp soft tissues.  IMPRESSION: 1. Continued development of small infarcts in the right peri-Rolandic cortex and right cerebellar hemisphere. Several small infarcts appear to be acute to early subacute. No associated mass effect or hemorrhage.  2. Otherwise regressed diffusion abnormality in the brain since 12/05/2012. Expected evolution of the previously-seen larger infarcts, including at the left frontal operculum and  left cerebellar hemisphere.   Electronically Signed   By: Augusto Gamble M.D.   On: 01/21/2013 13:43   EKG Sinus rhythm Left axis deviation Anterior infarct, old Nonspecific T abnormalities, inferior leads  Microbiology: No results found for this or any previous visit (from the past 240 hour(s)).   Labs: Basic Metabolic Panel:  Recent Labs Lab 01/21/13 1140 01/21/13 1147 01/22/13 1600  NA 135 139  --   K 3.9 4.0 3.8  CL 100 102  --   CO2 24  --   --   GLUCOSE 98 97  --   BUN 12 12  --   CREATININE 0.64 0.80  --   CALCIUM 9.6  --   --   MG  --   --  1.9   Liver Function Tests:  Recent Labs Lab 01/21/13 1140  AST 31  ALT 25  ALKPHOS 110  BILITOT 0.2*  PROT 7.3  ALBUMIN 3.2*   No results found for this basename: LIPASE,  AMYLASE,  in the last 168 hours No results found for this basename: AMMONIA,  in the last 168 hours CBC:  Recent Labs Lab 01/21/13 1140  01/24/13 0530 01/25/13 0520 01/26/13 0500 01/27/13 0545 01/28/13 0500  WBC 4.6  < > 5.1 5.1 5.3 5.4 5.6  NEUTROABS 3.1  --   --   --   --   --   --   HGB 9.4*  < > 8.9* 8.5* 8.4* 8.8* 8.6*  HCT 28.6*  < > 26.9* 26.1* 25.5* 26.9* 26.5*  MCV 88.3  < > 89.7 89.7 90.4 90.0 91.1  PLT 102*  < > 110* 128* 167 207 225  < > = values in this interval not displayed. Cardiac Enzymes:  Recent Labs Lab 01/21/13 1140 01/21/13 1928 01/22/13 0138 01/22/13 0755  TROPONINI 0.43* 0.41* 0.51* <0.30   BNP: BNP (last 3 results)  Recent Labs  12/05/12 1417  PROBNP 1659.0*   CBG: No results found for this basename: GLUCAP,  in the last 168 hours  Signed:  Brittian Renaldo L  [facility] 01/28/2013, 10:00 AM

## 2013-01-28 NOTE — Progress Notes (Signed)
NUTRITION FOLLOW UP  Intervention:    Human resources officer Cup with lunch and supper daily. Each serving contains 290 kcals and 9 gm protein.  Nutrition Dx:   Increased nutrient needs related to chronic catabolic illness as evidenced by estimated nutrition needs. Ongoing.  Goal:   Intake to meet >90% of estimated nutrition needs. Progressing.  Monitor:   PO intake, labs, weight trend.  Assessment:   Patient with advanced stage bilateral ovarian carcinoma. Receiving chemotherapy. Noted guarded prognosis. PO intake seems to be improving. Plans for discharge home tomorrow.   Height: Ht Readings from Last 1 Encounters:  01/21/13 5\' 7"  (1.702 m)    Weight Status:   Wt Readings from Last 1 Encounters:  01/21/13 137 lb 12.8 oz (62.506 kg)    Re-estimated needs:  Kcal: 1550-1750  Protein: 90-100 gm  Fluid: 1.6-1.8 L  Skin: no wounds  Diet Order: General  PO intake variable with 40-100% meal completion.  No intake or output data in the 24 hours ending 01/28/13 0808  Last BM: 10/4  Labs:   Recent Labs Lab 01/21/13 1140 01/21/13 1147 01/22/13 1600  NA 135 139  --   K 3.9 4.0 3.8  CL 100 102  --   CO2 24  --   --   BUN 12 12  --   CREATININE 0.64 0.80  --   CALCIUM 9.6  --   --   MG  --   --  1.9  GLUCOSE 98 97  --     CBG (last 3)  No results found for this basename: GLUCAP,  in the last 72 hours  Scheduled Meds: . influenza vac split quadrivalent PF  0.5 mL Intramuscular Tomorrow-1000  . lisinopril  2.5 mg Oral Daily  . metoprolol succinate  12.5 mg Oral BID  . pantoprazole  40 mg Oral Daily  . spironolactone  25 mg Oral Daily  . warfarin   Does not apply Once  . Warfarin - Pharmacist Dosing Inpatient   Does not apply q1800    Continuous Infusions: . heparin 1,400 Units/hr (01/27/13 1302)    Joaquin Courts, RD, LDN, CNSC Pager [phone] After Hours Pager [phone]

## 2013-01-29 ENCOUNTER — Telehealth: Payer: Self-pay | Admitting: Oncology

## 2013-01-29 NOTE — Telephone Encounter (Signed)
Pt called inquiring about an appt w/LL for 10/6 that was not on her discharge summary. Pt informed that she is only to have lb/CC appt 10/9 which she states she is aware of but thought she would be seeing LL. Pt informed that LL has ordered lb/CC 10/9 - lb/inf 10/13 and lb/LL 10/20. Pt given each appt d/t as requested per 10/6 pof.

## 2013-01-29 NOTE — Telephone Encounter (Signed)
returned pt call and advised and confirmed pt appts...pt ok and aware

## 2013-01-30 ENCOUNTER — Other Ambulatory Visit (HOSPITAL_BASED_OUTPATIENT_CLINIC_OR_DEPARTMENT_OTHER): Payer: Medicare Other | Admitting: Lab

## 2013-01-30 ENCOUNTER — Ambulatory Visit (HOSPITAL_BASED_OUTPATIENT_CLINIC_OR_DEPARTMENT_OTHER): Payer: Medicare Other | Admitting: Pharmacist

## 2013-01-30 DIAGNOSIS — Z86718 Personal history of other venous thrombosis and embolism: Secondary | ICD-10-CM

## 2013-01-30 DIAGNOSIS — I6350 Cerebral infarction due to unspecified occlusion or stenosis of unspecified cerebral artery: Secondary | ICD-10-CM

## 2013-01-30 DIAGNOSIS — I639 Cerebral infarction, unspecified: Secondary | ICD-10-CM

## 2013-01-30 LAB — PROTIME-INR: INR: 3.8 — ABNORMAL HIGH (ref 2.00–3.50)

## 2013-01-30 LAB — POCT INR: INR: 3.8

## 2013-01-30 NOTE — Patient Instructions (Signed)
Coumadin 5mg  on Thur and Sun and 7.5 mg other days. Recheck INR on 02/03/13; lab at 1:30 pm and infusion at 2:00. Pharmacist will see her during infusion.

## 2013-01-30 NOTE — Progress Notes (Signed)
Pt seen in clinic today with husband. INR=3.8 She was dc from hospital (post stroke, see ED note) on Tue, 10/7 on 7.5 mg daily Her doses previous to that during admission were:  10/1  5mg  10/2  5mg  10/3  7.5 mg 10/4  10 mg  10/5  10mg  10/6  10 mg  10/7  7.5 mg  I think we are seeing the 10 mg effects at this point She is always hesitant to skip doses, so we are decreasing her dose on Thur and Sun to 5mg  and 7.5 mg other days. She is due back on Mon for an infusion and we will check her to make sure that it is trending to the normal range of 2-3.

## 2013-01-31 ENCOUNTER — Other Ambulatory Visit: Payer: Self-pay

## 2013-01-31 ENCOUNTER — Telehealth: Payer: Self-pay | Admitting: Pharmacist

## 2013-01-31 MED ORDER — SPIRONOLACTONE 25 MG PO TABS: 25.0000 mg | ORAL_TABLET | Freq: Every day | ORAL | Status: DC

## 2013-01-31 MED ORDER — METOPROLOL SUCCINATE ER 25 MG PO TB24: 12.5000 mg | ORAL_TABLET | Freq: Every day | ORAL | Status: DC

## 2013-01-31 MED ORDER — LISINOPRIL 2.5 MG PO TABS: 2.5000 mg | ORAL_TABLET | Freq: Every day | ORAL | Status: DC

## 2013-01-31 NOTE — Telephone Encounter (Signed)
Dr. Darrold Span called the coumadin clinic this afternoon regarding Monique [name]. She was concerned Monique [name]'s dose could be to high over the weekend since Monique [name] took 5 mg on Tuesday and Wednesday this week after being discharged from the hospital (See Telephone/Documentation encounter by Ebony Hail) instead of the 7.5 mg she was discharged home on.   I called Monique [name] and confirmed that she did take 5 mg on Tuesday and Wednesday as instructed by our coumadin clinic. Her INR was 3.8 yesterday. Pt took 5 mg again last night.   After discussion with Dr. Darrold Span it was decided that Monique [name] will take 3.75 mg tonight (half of a 7.5 mg tablet) and then take 5 mg on Saturday and Sunday.  She is scheduled to return on Monday for infusion and will be seen by a pharmacist in the infusion area to assess her INR and coumadin regimen.  Thank you, Christell Faith, PharmD

## 2013-02-03 ENCOUNTER — Ambulatory Visit (HOSPITAL_BASED_OUTPATIENT_CLINIC_OR_DEPARTMENT_OTHER): Payer: Medicare Other | Admitting: Oncology

## 2013-02-03 ENCOUNTER — Encounter: Payer: Self-pay | Admitting: Oncology

## 2013-02-03 ENCOUNTER — Other Ambulatory Visit (HOSPITAL_BASED_OUTPATIENT_CLINIC_OR_DEPARTMENT_OTHER): Payer: Medicare Other | Admitting: Lab

## 2013-02-03 ENCOUNTER — Ambulatory Visit (HOSPITAL_BASED_OUTPATIENT_CLINIC_OR_DEPARTMENT_OTHER): Payer: Medicare Other | Admitting: Pharmacist

## 2013-02-03 ENCOUNTER — Ambulatory Visit (HOSPITAL_BASED_OUTPATIENT_CLINIC_OR_DEPARTMENT_OTHER): Payer: Medicare Other

## 2013-02-03 VITALS — BP 109/72 | HR 89 | Temp 99.3°F

## 2013-02-03 DIAGNOSIS — Z5111 Encounter for antineoplastic chemotherapy: Secondary | ICD-10-CM

## 2013-02-03 DIAGNOSIS — C569 Malignant neoplasm of unspecified ovary: Secondary | ICD-10-CM

## 2013-02-03 DIAGNOSIS — I6350 Cerebral infarction due to unspecified occlusion or stenosis of unspecified cerebral artery: Secondary | ICD-10-CM

## 2013-02-03 DIAGNOSIS — I639 Cerebral infarction, unspecified: Secondary | ICD-10-CM

## 2013-02-03 DIAGNOSIS — I809 Phlebitis and thrombophlebitis of unspecified site: Secondary | ICD-10-CM

## 2013-02-03 DIAGNOSIS — Z86718 Personal history of other venous thrombosis and embolism: Secondary | ICD-10-CM

## 2013-02-03 LAB — CBC WITH DIFFERENTIAL/PLATELET
BASO%: 0.4 % (ref 0.0–2.0)
Basophils Absolute: 0 10*3/uL (ref 0.0–0.1)
Eosinophils Absolute: 0.1 10*3/uL (ref 0.0–0.5)
HGB: 10 g/dL — ABNORMAL LOW (ref 11.6–15.9)
LYMPH%: 16.4 % (ref 14.0–49.7)
MCH: 29.2 pg (ref 25.1–34.0)
MCV: 88.9 fL (ref 79.5–101.0)
MONO%: 11.3 % (ref 0.0–14.0)
Platelets: 205 10*3/uL (ref 145–400)
RDW: 17.5 % — ABNORMAL HIGH (ref 11.2–14.5)
lymph#: 1.2 10*3/uL (ref 0.9–3.3)
nRBC: 0 % (ref 0–0)

## 2013-02-03 LAB — PROTIME-INR: INR: 2.4 (ref 2.00–3.50)

## 2013-02-03 LAB — POCT INR: INR: 2.4

## 2013-02-03 MED ORDER — SODIUM CHLORIDE 0.9 % IV SOLN
500.0000 mg/m2 | Freq: Once | INTRAVENOUS | Status: AC
  Administered 2013-02-03: 912 mg via INTRAVENOUS
  Filled 2013-02-03: qty 23.99

## 2013-02-03 MED ORDER — ONDANSETRON 8 MG/50ML IVPB (CHCC)
8.0000 mg | Freq: Once | INTRAVENOUS | Status: AC
  Administered 2013-02-03: 8 mg via INTRAVENOUS

## 2013-02-03 MED ORDER — SODIUM CHLORIDE 0.9 % IV SOLN
Freq: Once | INTRAVENOUS | Status: AC
  Administered 2013-02-03: 14:00:00 via INTRAVENOUS

## 2013-02-03 MED ORDER — SODIUM CHLORIDE 0.9 % IJ SOLN
10.0000 mL | INTRAMUSCULAR | Status: DC | PRN
  Filled 2013-02-03: qty 10

## 2013-02-03 MED ORDER — ACETAMINOPHEN 325 MG PO TABS
ORAL_TABLET | ORAL | Status: AC
  Filled 2013-02-03: qty 2

## 2013-02-03 MED ORDER — ACETAMINOPHEN 325 MG PO TABS
650.0000 mg | ORAL_TABLET | Freq: Once | ORAL | Status: AC
  Administered 2013-02-03: 650 mg via ORAL

## 2013-02-03 MED ORDER — DEXAMETHASONE SODIUM PHOSPHATE 10 MG/ML IJ SOLN
4.0000 mg | Freq: Once | INTRAMUSCULAR | Status: AC
  Administered 2013-02-03: 4 mg via INTRAVENOUS

## 2013-02-03 MED ORDER — HEPARIN SOD (PORK) LOCK FLUSH 100 UNIT/ML IV SOLN
250.0000 [IU] | Freq: Once | INTRAVENOUS | Status: DC | PRN
  Filled 2013-02-03: qty 5

## 2013-02-03 MED ORDER — DEXAMETHASONE SODIUM PHOSPHATE 10 MG/ML IJ SOLN
INTRAMUSCULAR | Status: AC
  Filled 2013-02-03: qty 1

## 2013-02-03 MED ORDER — ONDANSETRON 8 MG/NS 50 ML IVPB
INTRAVENOUS | Status: AC
  Filled 2013-02-03: qty 8

## 2013-02-03 NOTE — Patient Instructions (Signed)
[facility] Discharge Instructions for Patients Receiving Chemotherapy  Today you received the following chemotherapy agents gemzar.  To help prevent nausea and vomiting after your treatment, we encourage you to take your nausea medication as prescribed.  Please use warm soaks to left arm. Take ibuprofen as directed by Dr. Darrold Span. Please call nurse in the am with update on your left arm and right thigh.   If you develop nausea and vomiting that is not controlled by your nausea medication, call the clinic.   BELOW ARE SYMPTOMS THAT SHOULD BE REPORTED IMMEDIATELY:  *FEVER GREATER THAN 100.5 F  *CHILLS WITH OR WITHOUT FEVER  NAUSEA AND VOMITING THAT IS NOT CONTROLLED WITH YOUR NAUSEA MEDICATION  *UNUSUAL SHORTNESS OF BREATH  *UNUSUAL BRUISING OR BLEEDING  TENDERNESS IN MOUTH AND THROAT WITH OR WITHOUT PRESENCE OF ULCERS  *URINARY PROBLEMS  *BOWEL PROBLEMS  UNUSUAL RASH Items with * indicate a potential emergency and should be followed up as soon as possible.  Feel free to call the clinic you have any questions or concerns. The clinic phone number is [phone].

## 2013-02-03 NOTE — Patient Instructions (Signed)
INR at goal Continue 5 mg daily (5mg  tablet samples provided x 10 tablets) Recheck INR next week on Monday 02/10/13; lab at 1:15 pm, flush at 1:30pm, MD visit at 2pm and Coumadin clinic at 2:45pm

## 2013-02-03 NOTE — Progress Notes (Signed)
1425- pt states that she was hospitalized last week and had an IV in her left arm. This am she noted a red and swollen "knot" in her left antecubital area. The area is warm to touch,swollen and  Red.No drainage noted. I called Dr. Darrold Span and she will see pt to further evaluate.

## 2013-02-03 NOTE — Progress Notes (Signed)
OFFICE PROGRESS NOTE   02/03/2013   Physicians:P.Lazaro Arms, MD (PCP [facility])/ [place] Elpidio Anis, Leanord Hawking   INTERVAL HISTORY:  Patient is seen in infusion area for unscheduled visit due to erythematous, slightly swollen areas medial left elbow and right upper inner thigh; she is receiving cycle 4 gemcitabine today for progressive gyn carcinoma. Gemzar dose today has been reduced due to previous thrombocytopenia and need for ongoing anticoagulation. 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  INR remains therapeutic at 2.4 today. Patient has had no new or different neurologic symptoms over past couple of days, but noticed slightly tender, slightly erythematous swelling inner upper right thigh for past ~ 2 days. Earlier today she was aware of slightly tender and erythematous area medial left elbow with slight cord along left forearm vein at site of last IV in hospital. She has had no recurrent problems in lower legs bilaterally, no trauma. She has had no overt bleeding and no fever.  Hospitalization was 9-30 thru 01-28-13. She was on IV heparin in hospital until coumadin was therapeutic; coumadin was also therapeutic at [facility] on 01-30-13. [facility] coumadin clinic pharmacists are assisting. She has RUE PICC used for the gemzar.  ONCOLOGIC HISTORY Patient developed constipation with some right abdominal pain in May 2013. She saw GI physician in ~ Aug 2013, but was unable to tolerate prep for colonoscopy such that this was never accomplished. Constipation improved with probiotics and Activia. She had abdominal fullness more recently, with early satiety and some reflux symptoms. She developed LLE superficial phlebitis in Jan 2014 (dopplers by PCP negative for DVT then), followed by swelling and discomfort in left medial thigh with venous doppler at [facility] in [facility] on 05-21-12  with acute thrombus left common femoral vein. 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 She was begun on lovenox and transitioned to Xarelto by Dr Everlene Other. CA 125 was 569 initially. She saw Dr Duard Brady on 06-12-12, exam remarkable for distended abdomen with fluid wave and 15 cm fixed mass in pelvis. 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 Neoadjuvant dose dense carbo/taxol was beun 06-21-12 with 3 cycles given thru 08-16-12. She needed neupogen days 2,8 and 16 on this regimen. She had good partial response by CTs 08-19-12 and had improvement in CA 125 from 1041 as baseline for the chemotherapy in late Feb 2014 down to 92 on 08-21-12 after 3 cycles. She had IVC filter placed prior to gyn oncology surgery. She went to exploratory laparotomy with extensive lysis of adhesions, left ureteral lysis, and bilateral salpingo-oophorectomy by Dr Duard Brady at [facility] on 09-10-12, which  was suboptimal debulking. Operative findings: Diffuse carcinomatosis of the residual omentum involving the entire transverse colon. Transverse colon densely adherent to the anterior abdominal wall. No disease throughout the small bowel. Mesenteric adenopathy with  lymph nodes measuring 1 cm, involving the root of the small bowel mesentery. ~5 centimeter right ovarian mass and ~ 6 cm left ovarian mass. Small bowel densely adherent to the pelvis. 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 She resumed chemotherapy with dose dense taxol carboplatin on 09-27-12, completing cycle 5 on 11-08-12. CA 125 increased from 92 in April to 130 post op in June, then 225 late July and repeat 318. CT CAP done 11-19-12 showed some increase in nodular thickening along falciform ligament and small peritoneal nodule right iliac fossa, no ascites, liver ok, no pulmonary emboli, pulmonary nodules or cardiac concerns. Dr Duard Brady recommended change in chemotherapy to doxil with avastin, however echocardiogram 11-27-12 unexpectedly had EF of 15%. She received one cycle of gemzar on 12-02-12, given peripherally, then had embolic CVAs on 06-30-63 while still on xarelto. She was hospitalized 8-13 thru 12-11-12, had some bleeding into the embolic CVA on 12-05-12, and treated with heparin transitioned to coumadin. She was stable to resume gemzar with cycle 2 at 800 mg/m2 given on 12-30-12,then platelets down to 70k by day 8. She had progressive superficial thrombophlebitis LE on coumadin by 12-25-12 and was changed to full dose lovenox then; there has been difficulty getting lovenox covered by insurance. Cycle 3 gemzar 01-13-13 was dose reduced to 600 mg/m2, however platelets still dropped to 90k; she had recurrent embolic CVAs on 7-84-69 and was hospitalized thru 01-28-13, treated with continuous heparin transitioned back to coumadin. She is also  now on baby ASA daily.   Review of systems as above, also: No pain. Not SOB. Bowels ok, able to eat. Knee high teds seem helpful with very prominent varicose veins.Felt reasonably well over weekend.  Remainder of 10 point Review of Systems negative.  Objective:  Vital signs in last 24 hours: per infusion flow sheets. Respirations not labored RA. Awake, alert, looks comfortable in recliner with gemzar infusing via RUE PICC without difficulty.  HEENT:PERRL, sclerae not icteric. Oral mucosa moist without lesions.  Neck supple. No JVD.  Lymphatics:no cervical,suraclaviculardenopathy Resp: clear to auscultation bilaterally  Cardio: regular rate and rhythm. No gallop. GI: soft, nontender, not distended. Normally active bowel sounds.  Musculoskeletal/ Extremities: without pitting edema. Area of slightly firm erythema ~ 4.5 cm diameter left medial elbow, barely warm, minimally tender. Slight erythema tracking up vein on left forearm ~ 4 cm length. Medial right thigh with mild erythema and slight puffiness no clear cord and not tender, ~ 12 cm diameter. Lower legs and left upper arm not remarkable. Neuro: alert, oriented appropriate. Changes positions in chair easily. Speech with just minimal hesitations. Skin otherwise without rash, ecchymosis, petechiae   Lab Results:  Results for orders placed in visit on 02/03/13  POCT INR      Result Value Range   INR 2.4      CBC today with WBC 7.1, hgb 10, plt 205  Studies/Results:  No results found.  Medications: I have reviewed the patient's current  medications. She will take ibuprofen with food on arrival home today and repeat tonight. She will call RN tomorrow AM to let us know how she is. If symptoms suggestive of progressive thrombophlebitis, will likely change treatment to SQ heparin. Note she has discussed SQ heparin with pharmacist during most recent hospitalization; she is able to do own injections if  so.    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 I will see her at least 02-10-13. 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 IVC filter in. Underlying etiology likely the advanced gyn cancer. Very difficult situation. Will add 2 doses ibuprofen today + warm soaks. SQ heparin would be next choice if needed. 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 Follow up with Dr Katrinka Blazing.  4.long tobacco now discontinued.  5.Advance Directives and HCPOA completed. With advanced, resistant cancer, multiple CVAs and cardiac disease, I doubt life support would be beneficial overall.  6.power PICC in  7.  flu vaccine done in hospital    Patient understands and agrees with plan above. Nursing aware.    Reece Packer, MD   02/03/2013, 3:46 PM

## 2013-02-03 NOTE — Progress Notes (Signed)
INR at goal Pt is doing well with no complaints or issues re: anticoagulation Patient does have small red area on inner left elbow that appears red/swollen - possible phlebitis/cellulits? Dr. Darrold Span to assess today Patient took coumadin as instructed last week with 3.75 mg on Friday and then 5 mg on Saturday and Sunday Based on this and her INR of 2.4 today we will start her on 5 mg daily (5mg  tablet samples provided) Recheck INR on 02/10/13; lab at 1:15 pm, flush at 1:30pm, MD visit at 2pm and Coumadin clinic at 2:45pm  Samples: Coumadin 5 mg x 10 LOT: [id number] Exp: Feb 2016

## 2013-02-04 ENCOUNTER — Telehealth: Payer: Self-pay | Admitting: *Deleted

## 2013-02-04 NOTE — Telephone Encounter (Signed)
LEFT ARM IS MUCH BETTER. THE SWELLING IN HER ELBOW HAS DECREASED AND THE REDNESS IS FADING. NO SORENESS. HER RIGHT THIGH IS ALSO MUCH BETTER. NOTIFIED DR.LIVESAY. VERBAL ORDER AND READ BACK TO DR.LIVESAY- TAKE IBUPROFEN ONCE A DAY WITH FOOD. CONTINUE COUMADIN AND BABY ASPIRIN AS DIRECTED. CONTINUE WARM COMPRESSES. HAVE PT. CALL TOMORROW WITH AN UPDATE. NOTIFIED PT. OF ABOVE INSTRUCTIONS. SHE VOICES UNDERSTANDING.

## 2013-02-05 ENCOUNTER — Telehealth: Payer: Self-pay

## 2013-02-05 NOTE — Telephone Encounter (Signed)
Left message for Monique [name] that Dr. Darrold Span is glad that the redness on the left arm/elbow is down to the size of a 50 cent piece with minimal swelling and that the red streak is gone. Glad the the right thigh is bette.   Take a dose of Ibuprophen today and then stop.  Call if areas worse prior to appointment 02-10-13 with Dr. Darrold Span.

## 2013-02-09 ENCOUNTER — Other Ambulatory Visit: Payer: Self-pay | Admitting: Oncology

## 2013-02-10 ENCOUNTER — Encounter: Payer: Self-pay | Admitting: Oncology

## 2013-02-10 ENCOUNTER — Ambulatory Visit (HOSPITAL_BASED_OUTPATIENT_CLINIC_OR_DEPARTMENT_OTHER): Payer: Medicare Other | Admitting: Oncology

## 2013-02-10 ENCOUNTER — Ambulatory Visit (HOSPITAL_BASED_OUTPATIENT_CLINIC_OR_DEPARTMENT_OTHER): Payer: Medicare Other

## 2013-02-10 ENCOUNTER — Telehealth: Payer: Self-pay | Admitting: *Deleted

## 2013-02-10 ENCOUNTER — Ambulatory Visit (HOSPITAL_BASED_OUTPATIENT_CLINIC_OR_DEPARTMENT_OTHER): Payer: Medicare Other | Admitting: Pharmacist

## 2013-02-10 ENCOUNTER — Telehealth: Payer: Self-pay | Admitting: Oncology

## 2013-02-10 ENCOUNTER — Other Ambulatory Visit (HOSPITAL_BASED_OUTPATIENT_CLINIC_OR_DEPARTMENT_OTHER): Payer: Medicare Other

## 2013-02-10 VITALS — BP 111/71 | HR 65 | Temp 98.0°F | Resp 20 | Ht 67.0 in | Wt 138.8 lb

## 2013-02-10 DIAGNOSIS — C569 Malignant neoplasm of unspecified ovary: Secondary | ICD-10-CM

## 2013-02-10 DIAGNOSIS — I6350 Cerebral infarction due to unspecified occlusion or stenosis of unspecified cerebral artery: Secondary | ICD-10-CM

## 2013-02-10 DIAGNOSIS — Z86718 Personal history of other venous thrombosis and embolism: Secondary | ICD-10-CM

## 2013-02-10 DIAGNOSIS — Z452 Encounter for adjustment and management of vascular access device: Secondary | ICD-10-CM

## 2013-02-10 DIAGNOSIS — I429 Cardiomyopathy, unspecified: Secondary | ICD-10-CM

## 2013-02-10 DIAGNOSIS — D6859 Other primary thrombophilia: Secondary | ICD-10-CM

## 2013-02-10 DIAGNOSIS — Z23 Encounter for immunization: Secondary | ICD-10-CM

## 2013-02-10 DIAGNOSIS — I639 Cerebral infarction, unspecified: Secondary | ICD-10-CM

## 2013-02-10 DIAGNOSIS — Z87891 Personal history of nicotine dependence: Secondary | ICD-10-CM

## 2013-02-10 LAB — CBC WITH DIFFERENTIAL/PLATELET
BASO%: 0.4 % (ref 0.0–2.0)
EOS%: 0.7 % (ref 0.0–7.0)
Eosinophils Absolute: 0 10*3/uL (ref 0.0–0.5)
MCHC: 32.1 g/dL (ref 31.5–36.0)
MCV: 88.7 fL (ref 79.5–101.0)
MONO#: 0.4 10*3/uL (ref 0.1–0.9)
NEUT#: 1.2 10*3/uL — ABNORMAL LOW (ref 1.5–6.5)
RBC: 3.44 10*6/uL — ABNORMAL LOW (ref 3.70–5.45)
RDW: 17.2 % — ABNORMAL HIGH (ref 11.2–14.5)
WBC: 2.8 10*3/uL — ABNORMAL LOW (ref 3.9–10.3)
lymph#: 1.1 10*3/uL (ref 0.9–3.3)
nRBC: 0 % (ref 0–0)

## 2013-02-10 LAB — PROTIME-INR
INR: 4 — ABNORMAL HIGH (ref 2.00–3.50)
Protime: 48 Seconds — ABNORMAL HIGH (ref 10.6–13.4)

## 2013-02-10 MED ORDER — HEPARIN SOD (PORK) LOCK FLUSH 100 UNIT/ML IV SOLN
250.0000 [IU] | Freq: Once | INTRAVENOUS | Status: AC
  Administered 2013-02-10: 250 [IU] via INTRAVENOUS
  Filled 2013-02-10: qty 5

## 2013-02-10 MED ORDER — SODIUM CHLORIDE 0.9 % IJ SOLN
10.0000 mL | Freq: Once | INTRAMUSCULAR | Status: AC
  Administered 2013-02-10: 10 mL
  Filled 2013-02-10: qty 10

## 2013-02-10 NOTE — Telephone Encounter (Signed)
, °

## 2013-02-10 NOTE — Progress Notes (Signed)
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 She had fourth cycle of gemcitabine on 02-03-13, this chosen due to cardiomyopathy (EF 15-20%) and multiple embolic CVAs. She is now back on coumadin, supratherapeutic today, but no overt bleeding. She has power PICC for gemzar. She is tolerating the gemzar well, remains asymptomatic from the ovarian cancer other than hypercoagulable state, and has had no new or different neurologic or cardiac symptoms since last seen. She is to see neurology on 02-24-13 and Dr Garnette Scheuermann on 03-11-13. Areas concerning for superficial thrombophlebitis right thigh and LUE last week improved promptly with warm soaks and ibuprofen x 3 days. Gemzar will be every other week if possible.   ONCOLOGIC HISTORY Patient developed constipation with some right abdominal pain in May 2013. She saw GI physician in ~ Aug 2013, but was unable to tolerate prep for colonoscopy such that this was never accomplished. Constipation improved with probiotics and Activia. She had abdominal fullness more recently, with early satiety and some reflux symptoms. She developed LLE superficial phlebitis in Jan 2014 (dopplers by PCP negative for DVT then), followed by swelling and discomfort in left medial thigh with venous doppler at [facility] in [facility] on 05-21-12 with acute thrombus left common femoral vein. 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 She was begun on lovenox and transitioned to Xarelto by Dr Everlene Other. CA 125 was 569 initially. She saw Dr Duard Brady on 06-12-12, exam remarkable for distended abdomen with fluid wave and 15 cm fixed mass in pelvis. 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 Neoadjuvant dose dense carbo/taxol was beun 06-21-12 with 3 cycles given thru 08-16-12. She needed neupogen days 2,8 and 16 on this regimen. She had good partial response by CTs 08-19-12 and had improvement in CA 125 from 1041 as baseline for the chemotherapy in late Feb 2014 down to 92 on 08-21-12 after 3 cycles. She had IVC filter placed prior to gyn oncology surgery. She went to exploratory laparotomy with extensive lysis of adhesions, left ureteral lysis, and bilateral salpingo-oophorectomy by Dr Duard Brady at [facility] on 09-10-12, which was suboptimal debulking. Operative findings: Diffuse carcinomatosis of the residual omentum involving the entire transverse colon. Transverse colon densely adherent to the anterior abdominal wall. No disease throughout the small bowel. Mesenteric adenopathy with lymph nodes measuring 1 cm, involving the root of the small bowel mesentery. ~5 centimeter right ovarian mass  and ~ 6 cm left ovarian mass. Small bowel densely adherent to the pelvis. 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 She resumed chemotherapy with dose dense taxol carboplatin on 09-27-12, completing cycle 5 on 11-08-12. CA 125 increased from 92 in April to 130 post op in June, then 225 late July and repeat 318. CT CAP done 11-19-12 showed some increase in nodular thickening along falciform ligament and small peritoneal nodule right iliac fossa, no ascites, liver ok, no pulmonary emboli, pulmonary nodules or cardiac concerns. Dr Duard Brady recommended change in chemotherapy to doxil with avastin, however echocardiogram 11-27-12 unexpectedly had EF of 15%. She received one cycle of gemzar on 12-02-12, given peripherally, then had embolic CVAs on 7-82-95 while still on xarelto. She was hospitalized 8-13 thru 12-11-12, had some bleeding into the embolic CVA on 12-05-12, and treated with heparin transitioned to coumadin. She was stable to resume gemzar with cycle 2 at 800 mg/m2 given on 12-30-12,then platelets down to 70k by day 8. She had progressive superficial thrombophlebitis LE on coumadin by 12-25-12 and was changed to full dose lovenox then; there has been difficulty getting lovenox covered by insurance. Cycle 3 gemzar 01-13-13 was dose reduced to 600 mg/m2, however platelets still dropped to 90k; she had recurrent embolic CVAs on 10-12-28 and was hospitalized thru 01-28-13, treated with continuous heparin transitioned back to coumadin. She is also now on baby ASA daily.   Review of systems as above, also: No nausea, po intake good, avoiding greens.Knee high Teds seem helpful. Bowels ok. No abdominal or pelvic discomfort. No problems with PICC, which she is glad to keep for now.  Remainder of 10 point Review of Systems  negative.  Objective:  Vital signs in last 24 hours:  BP 111/71  Pulse 65  Temp(Src) 98 F (36.7 C) (Oral)  Resp 20  Ht 5\' 7"  (1.702 m)  Wt 138 lb 12.8 oz (62.959 kg)  BMI 21.73 kg/m2  Alert, oriented and appropriate. Ambulatory without difficulty.  Alopecia  HEENT:PERRL, sclerae not icteric. Oral mucosa moist without lesions, posterior pharynx clear.  Neck supple. No JVD.  Lymphatics:no cervical,suraclavicular adenopathy Resp: clear to auscultation bilaterally and normal percussion bilaterally Cardio: regular rate and rhythm. No gallop. GI: soft, nontender, not distended, no mass or organomegaly. Normally active bowel sounds. Surgical incision not remarkable. Musculoskeletal/ Extremities: without pitting edema, cords, tenderness. Minimal dull erythema without heat or tenderness left elbow area and same dull erythema right upper medial thigh without tenderness or cord. Neuro: speech mostly fluent. No obvious motor, cerebellar or sensory deficits. Skin without rash, ecchymosis, petechiae PICC dressing clean and intact, no swelling or tenderness.  Lab Results:  Results for orders placed in visit on 02/10/13  TECHNOLOGIST REVIEW      Result Value Range   Technologist Review Mod Ovalos, Few Helmets and Fragments    CBC WITH DIFFERENTIAL      Result Value Range   WBC 2.8 (*) 3.9 - 10.3 10e3/uL   NEUT# 1.2 (*) 1.5 - 6.5 10e3/uL   HGB 9.8 (*) 11.6 - 15.9 g/dL   HCT 86.5 (*) 78.4 - 69.6 %   Platelets 102 (*) 145 - 400  10e3/uL   MCV 88.7  79.5 - 101.0 fL   MCH 28.5  25.1 - 34.0 pg   MCHC 32.1  31.5 - 36.0 g/dL   RBC 1.61 (*) 0.96 - 0.45 10e6/uL   RDW 17.2 (*) 11.2 - 14.5 %   lymph# 1.1  0.9 - 3.3 10e3/uL   MONO# 0.4  0.1 - 0.9 10e3/uL   Eosinophils Absolute 0.0  0.0 - 0.5 10e3/uL   Basophils Absolute 0.0  0.0 - 0.1 10e3/uL   NEUT% 44.4  38.4 - 76.8 %   LYMPH% 39.7  14.0 - 49.7 %   MONO% 14.8 (*) 0.0 - 14.0 %   EOS% 0.7  0.0 - 7.0 %   BASO% 0.4  0.0 - 2.0 %   nRBC 0  0 -  0 %  PROTIME-INR      Result Value Range   Protime 48.0 (*) 10.6 - 13.4 Seconds   INR 4.00 (*) 2.00 - 3.50   Lovenox No       Studies/Results:  No results found.  Medications: I have reviewed the patient's current medications. Coumadin dosing also per [facility] coumadin clinic: hold today, 2.5 mg 02-11-13 , 5 mg 02-12-13 and recheck + CBC on 02-13-13 with PICC flush that day.    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 She will have next gemzar on 02-17-13 if ANC >=1.5 and plt >=120k and if otherwise stable. I will see her on 02-24-13 or sooner if needed. We are following CA125, which may be leveling off somewhat (with erratic chemo course to date). 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 IVC filter in. Underlying etiology likely the advanced gyn cancer. Very difficult situation. SQ heparin would be next choice if needed, but actually doing well on coumadin at present.  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 Follow up with Dr Katrinka Blazing.  4.long tobacco now discontinued.  5.Advance Directives and HCPOA completed. With advanced, resistant cancer, multiple CVAs and cardiac disease, I doubt life support would be beneficial overall.  6.power PICC in: new protocol is to flush with heparin twice weekly, which we will do at [facility] on Mondays and Thursdays.  7. flu vaccine done        Reece Packer, MD   02/10/2013, 4:57 PM

## 2013-02-10 NOTE — Telephone Encounter (Signed)
Per staff message and POF I have scheduled appts.  JMW  

## 2013-02-10 NOTE — Patient Instructions (Signed)
Hold coumadin today, then dose per pharmacist until next check with PICC flush on 02-13-13

## 2013-02-10 NOTE — Patient Instructions (Signed)
Peripherally Inserted Central Catheter (PICC) Home Guide A peripherally inserted central catheter (PICC) is a long, thin, flexible tube that is inserted into a vein in the upper arm. It is a form of intravenous (IV) access. It is considered to be a "central" line because the tip of the PICC ends in a large vein in your chest. This large vein is called the superior vena cava (SVC). The PICC tip ends in the SVC because there is a lot of blood flow in the SVC. This allows medicines and IV fluids to be quickly distributed throughout the body. The PICC is inserted using a sterile technique by a specially trained nurse or physician. After the PICC is inserted, a chest X-ray is done to be sure it is in the correct place.  A PICC may be placed for different reasons, such as:  To give medicines and liquid nutrition that can only be given through a central line. Examples are:  Certain antibiotic treatments.  Chemotherapy.  Total parenteral nutrition (TPN).  To take frequent blood samples.  To give IV fluids and blood products.  If there is difficulty placing a peripheral intravenous (PIV) catheter. If taken care of properly, a PICC can remain in place for several months. A PICC can also allow patients to go home early. Medicine and PICC care can be managed at home by a family member or home healthcare team. RISKS AND COMPLICATIONS Possible problems with a PICC can occasionally occur. This may include:  A clot (thrombus) forming in or at the tip of the PICC. This can cause the PICC to become clogged. A "clot-busting" medicine called tissue plasminogen activator (tPA) can be inserted into the PICC to help break up the clot.  Inflammation of the vein (phlebitis) in which the PICC is placed. Signs of inflammation may include redness, pain at the insertion site, red streaks, or being able to feel a "cord" in the vein where the PICC is located.  Infection in the PICC or at the insertion site. Signs of  infection may include fever, chills, redness, swelling, or pus drainage from the PICC insertion site.  PICC movement (malposition). The PICC tip may migrate from its original position due to excessive physical activity, forceful coughing, sneezing, or vomiting.  A break or cut in the PICC. It is important to not use scissors near the PICC.  Nerve or tendon irritation or injury during PICC insertion. HOME CARE INSTRUCTIONS Activity  You may bend your arm and move it freely. If your PICC is near or at the bend of your elbow, avoid activity with repeated motion at the elbow.  Avoid lifting heavy objects as instructed by your caregiver.  Avoid using a crutch with the arm on the same side as your PICC. You may need to use a walker. PICC Dressing  Keep your PICC bandage (dressing) clean and dry to prevent infection.  Ask your caregiver when you may shower. Ask your caregiver to teach you how to wrap the PICC when you do take a shower.  Do not bathe, swim, or use hot tubs when you have a PICC.  Change the PICC dressing as instructed by your caregiver.  Change your PICC dressing if it becomes loose or wet. General PICC Care  Check the PICC insertion site daily for leakage, redness, swelling, or pain.  Flush the PICC as directed by your caregiver. Let your caregiver know right away if the PICC is difficult to flush or does not flush. Do not use force   to flush the PICC.  Do not use a syringe that is less than 10 mLs to flush the PICC.  Never pull or tug on the PICC.  Avoid blood pressure checks on the arm with the PICC.  Keep your PICC identification card with you at all times.  Do not take the PICC out yourself. Only a trained clinical professional should remove the PICC. SEEK IMMEDIATE MEDICAL CARE IF:  Your PICC is accidently pulled all the way out. If this happens, cover the insertion site with a bandage or gauze dressing. Do not throw the PICC away. Your caregiver will need to  inspect it.  Your PICC was tugged or pulled and has partially come out. Do not  push the PICC back in.  There is any type of drainage, redness, or swelling where the PICC enters the skin.  You cannot flush the PICC, it is difficult to flush, or the PICC leaks around the insertion site when it is flushed.  You hear a "flushing" sound when the PICC is flushed.  You have pain, discomfort, or numbness in your arm, shoulder, or jaw on the same side as the PICC .  You feel your heart "racing" or skipping beats.  You notice a hole or tear in the PICC.  You develop chills or a fever. MAKE SURE YOU:   Understand these instructions.  Will watch your condition.  Will get help right away if you are not doing well or get worse. Document Released: 10/15/2002 Document Revised: 07/03/2011 Document Reviewed: 08/15/2010 ExitCare Patient Information 2014 [employer].  

## 2013-02-10 NOTE — Patient Instructions (Signed)
INR above goal Hold coumadin tonight Take 1/2 tablet (2.5 mg) tomorrow then Take 5 mg on Wednesday Recheck INR on Thursday 02/13/13 with scheduled labs and flush appointments

## 2013-02-10 NOTE — Progress Notes (Signed)
INR above goal today. Pt seen today by Dr. Darrold Span Pt recently back to coumadin after CVA on lovenox. Pt reports no complications regarding anticoagulation therapy No missed or extra doses Pt has not been eating dark leafy green vegatables etc. In her diet. Pt had been taking 5 mg daily. This dose may be slightly on the high side Plan for the week: No coumadin tonight. Then Monique [name] will Take 1/2 tablet (2.5 mg) tomorrow then  5 mg on Wednesday Recheck INR on 02/13/13 with scheduled labs and flush appointments

## 2013-02-13 ENCOUNTER — Ambulatory Visit (HOSPITAL_BASED_OUTPATIENT_CLINIC_OR_DEPARTMENT_OTHER): Payer: Medicare Other

## 2013-02-13 ENCOUNTER — Ambulatory Visit: Payer: Medicare Other | Admitting: Pharmacist

## 2013-02-13 ENCOUNTER — Other Ambulatory Visit (HOSPITAL_BASED_OUTPATIENT_CLINIC_OR_DEPARTMENT_OTHER): Payer: Medicare Other | Admitting: Lab

## 2013-02-13 VITALS — BP 104/43 | HR 57 | Temp 97.9°F | Resp 20

## 2013-02-13 DIAGNOSIS — C569 Malignant neoplasm of unspecified ovary: Secondary | ICD-10-CM

## 2013-02-13 DIAGNOSIS — Z452 Encounter for adjustment and management of vascular access device: Secondary | ICD-10-CM

## 2013-02-13 DIAGNOSIS — D6859 Other primary thrombophilia: Secondary | ICD-10-CM

## 2013-02-13 LAB — CBC WITH DIFFERENTIAL/PLATELET
Basophils Absolute: 0 10*3/uL (ref 0.0–0.1)
EOS%: 1.1 % (ref 0.0–7.0)
Eosinophils Absolute: 0.1 10*3/uL (ref 0.0–0.5)
HCT: 31.3 % — ABNORMAL LOW (ref 34.8–46.6)
LYMPH%: 19.4 % (ref 14.0–49.7)
MCH: 28.8 pg (ref 25.1–34.0)
MCHC: 31.6 g/dL (ref 31.5–36.0)
MCV: 91 fL (ref 79.5–101.0)
MONO%: 15.2 % — ABNORMAL HIGH (ref 0.0–14.0)
NEUT#: 3 10*3/uL (ref 1.5–6.5)
Platelets: 95 10*3/uL — ABNORMAL LOW (ref 145–400)
RBC: 3.44 10*6/uL — ABNORMAL LOW (ref 3.70–5.45)
nRBC: 0 % (ref 0–0)

## 2013-02-13 LAB — POCT INR: INR: 1.7

## 2013-02-13 MED ORDER — SODIUM CHLORIDE 0.9 % IJ SOLN
10.0000 mL | INTRAMUSCULAR | Status: DC | PRN
  Filled 2013-02-13: qty 10

## 2013-02-13 MED ORDER — HEPARIN SOD (PORK) LOCK FLUSH 100 UNIT/ML IV SOLN
500.0000 [IU] | Freq: Once | INTRAVENOUS | Status: AC
  Administered 2013-02-13: 250 [IU] via INTRAVENOUS
  Filled 2013-02-13: qty 5

## 2013-02-13 MED ORDER — HEPARIN SOD (PORK) LOCK FLUSH 100 UNIT/ML IV SOLN
500.0000 [IU] | Freq: Once | INTRAVENOUS | Status: DC
  Filled 2013-02-13: qty 5

## 2013-02-13 MED ORDER — SODIUM CHLORIDE 0.9 % IJ SOLN
10.0000 mL | INTRAMUSCULAR | Status: DC | PRN
  Administered 2013-02-13: 10 mL via INTRAVENOUS
  Filled 2013-02-13: qty 10

## 2013-02-13 NOTE — Patient Instructions (Signed)
Resume Coumadin at 5 mg daily.   Recheck INR on 02/17/13 with scheduled lab and infusion appointment.  RPh will see you during your infusion

## 2013-02-13 NOTE — Progress Notes (Signed)
Pt seen in clinic today with her husband after her flush appmt INR=1.7 after holding a day and decreasing another day Will resume at 5mg  daily RTC on Mon 02/17/13 during her infusion She had no other changes to report.

## 2013-02-17 ENCOUNTER — Ambulatory Visit: Payer: Medicare Other | Admitting: Pharmacist

## 2013-02-17 ENCOUNTER — Other Ambulatory Visit: Payer: Self-pay | Admitting: *Deleted

## 2013-02-17 ENCOUNTER — Other Ambulatory Visit (HOSPITAL_BASED_OUTPATIENT_CLINIC_OR_DEPARTMENT_OTHER): Payer: Medicare Other | Admitting: Lab

## 2013-02-17 ENCOUNTER — Ambulatory Visit (HOSPITAL_BASED_OUTPATIENT_CLINIC_OR_DEPARTMENT_OTHER): Payer: Medicare Other

## 2013-02-17 VITALS — BP 113/62 | HR 67 | Temp 97.9°F

## 2013-02-17 DIAGNOSIS — C569 Malignant neoplasm of unspecified ovary: Secondary | ICD-10-CM

## 2013-02-17 DIAGNOSIS — Z5111 Encounter for antineoplastic chemotherapy: Secondary | ICD-10-CM

## 2013-02-17 DIAGNOSIS — D6859 Other primary thrombophilia: Secondary | ICD-10-CM

## 2013-02-17 DIAGNOSIS — I6350 Cerebral infarction due to unspecified occlusion or stenosis of unspecified cerebral artery: Secondary | ICD-10-CM

## 2013-02-17 LAB — CBC WITH DIFFERENTIAL/PLATELET
Basophils Absolute: 0 10*3/uL (ref 0.0–0.1)
EOS%: 1.9 % (ref 0.0–7.0)
HCT: 31.3 % — ABNORMAL LOW (ref 34.8–46.6)
HGB: 10.2 g/dL — ABNORMAL LOW (ref 11.6–15.9)
MCH: 29.5 pg (ref 25.1–34.0)
MCHC: 32.6 g/dL (ref 31.5–36.0)
MONO#: 0.8 10*3/uL (ref 0.1–0.9)
NEUT%: 71.2 % (ref 38.4–76.8)
WBC: 6.4 10*3/uL (ref 3.9–10.3)
lymph#: 0.8 10*3/uL — ABNORMAL LOW (ref 0.9–3.3)

## 2013-02-17 LAB — POCT INR: INR: 2.4

## 2013-02-17 LAB — PROTIME-INR: INR: 2.4 (ref 2.00–3.50)

## 2013-02-17 MED ORDER — ACETAMINOPHEN 325 MG PO TABS
650.0000 mg | ORAL_TABLET | Freq: Once | ORAL | Status: AC
  Administered 2013-02-17: 650 mg via ORAL

## 2013-02-17 MED ORDER — DEXAMETHASONE 4 MG PO TABS
ORAL_TABLET | ORAL | Status: AC
  Filled 2013-02-17: qty 2

## 2013-02-17 MED ORDER — DEXAMETHASONE SODIUM PHOSPHATE 10 MG/ML IJ SOLN
4.0000 mg | Freq: Once | INTRAMUSCULAR | Status: AC
  Administered 2013-02-17: 11:00:00 via INTRAVENOUS

## 2013-02-17 MED ORDER — SODIUM CHLORIDE 0.9 % IJ SOLN
10.0000 mL | INTRAMUSCULAR | Status: DC | PRN
  Administered 2013-02-17: 10 mL
  Filled 2013-02-17: qty 10

## 2013-02-17 MED ORDER — ACETAMINOPHEN 325 MG PO TABS
ORAL_TABLET | ORAL | Status: AC
  Filled 2013-02-17: qty 2

## 2013-02-17 MED ORDER — SODIUM CHLORIDE 0.9 % IV SOLN
500.0000 mg/m2 | Freq: Once | INTRAVENOUS | Status: AC
  Administered 2013-02-17: 912 mg via INTRAVENOUS
  Filled 2013-02-17: qty 23.99

## 2013-02-17 MED ORDER — ONDANSETRON 8 MG/50ML IVPB (CHCC)
8.0000 mg | Freq: Once | INTRAVENOUS | Status: AC
  Administered 2013-02-17: 8 mg via INTRAVENOUS

## 2013-02-17 MED ORDER — HEPARIN SOD (PORK) LOCK FLUSH 100 UNIT/ML IV SOLN
250.0000 [IU] | Freq: Once | INTRAVENOUS | Status: AC | PRN
  Administered 2013-02-17: 250 [IU]
  Filled 2013-02-17: qty 5

## 2013-02-17 MED ORDER — ONDANSETRON 8 MG/NS 50 ML IVPB
INTRAVENOUS | Status: AC
  Filled 2013-02-17: qty 8

## 2013-02-17 MED ORDER — SODIUM CHLORIDE 0.9 % IV SOLN
Freq: Once | INTRAVENOUS | Status: AC
  Administered 2013-02-17: 11:00:00 via INTRAVENOUS

## 2013-02-17 MED ORDER — DEXAMETHASONE SODIUM PHOSPHATE 10 MG/ML IJ SOLN
INTRAMUSCULAR | Status: AC
  Filled 2013-02-17: qty 1

## 2013-02-17 NOTE — Patient Instructions (Signed)
[facility] Discharge Instructions for Patients Receiving Chemotherapy  Today you received the following chemotherapy agents Gemzar To help prevent nausea and vomiting after your treatment, we encourage you to take your nausea medication  As per Dr. Darrold Span.    If you develop nausea and vomiting that is not controlled by your nausea medication, call the clinic.   BELOW ARE SYMPTOMS THAT SHOULD BE REPORTED IMMEDIATELY:  *FEVER GREATER THAN 100.5 F  *CHILLS WITH OR WITHOUT FEVER  NAUSEA AND VOMITING THAT IS NOT CONTROLLED WITH YOUR NAUSEA MEDICATION  *UNUSUAL SHORTNESS OF BREATH  *UNUSUAL BRUISING OR BLEEDING  TENDERNESS IN MOUTH AND THROAT WITH OR WITHOUT PRESENCE OF ULCERS  *URINARY PROBLEMS  *BOWEL PROBLEMS  UNUSUAL RASH Items with * indicate a potential emergency and should be followed up as soon as possible.  Feel free to call the clinic you have any questions or concerns. The clinic phone number is [phone].

## 2013-02-17 NOTE — Progress Notes (Unsigned)
Started with "cold symptoms"Thursday evening and worsen over the weekend with nasal dripping, mild non-productive cough.  No fever during this period of time.  Symptom much improved this am.  HL Above discussed with Dr. Darrold Span.  Okay to proceed with chemo.  Health teaching reinforced  With symptom management and when to call. HL

## 2013-02-17 NOTE — Progress Notes (Signed)
INR at goal today.  Monique [name] has no medication changes.  Only bruising from where she hit her shin.  She is here for chemo today too.  Will continue coumadin 5mg  daily and check PT/INR next week when Monique [name] here for MD appt and flush.

## 2013-02-19 ENCOUNTER — Other Ambulatory Visit: Payer: Self-pay | Admitting: *Deleted

## 2013-02-19 DIAGNOSIS — C569 Malignant neoplasm of unspecified ovary: Secondary | ICD-10-CM

## 2013-02-19 DIAGNOSIS — K30 Functional dyspepsia: Secondary | ICD-10-CM

## 2013-02-19 MED ORDER — PANTOPRAZOLE SODIUM 40 MG PO TBEC: 40.0000 mg | DELAYED_RELEASE_TABLET | Freq: Every day | ORAL | Status: DC

## 2013-02-22 ENCOUNTER — Other Ambulatory Visit: Payer: Self-pay | Admitting: Oncology

## 2013-02-24 ENCOUNTER — Ambulatory Visit: Payer: Medicare Other | Admitting: Pharmacist

## 2013-02-24 ENCOUNTER — Ambulatory Visit (HOSPITAL_BASED_OUTPATIENT_CLINIC_OR_DEPARTMENT_OTHER): Payer: Medicare Other | Admitting: Oncology

## 2013-02-24 ENCOUNTER — Ambulatory Visit (HOSPITAL_BASED_OUTPATIENT_CLINIC_OR_DEPARTMENT_OTHER): Payer: Medicare Other

## 2013-02-24 ENCOUNTER — Encounter: Payer: Self-pay | Admitting: Oncology

## 2013-02-24 ENCOUNTER — Ambulatory Visit: Payer: Medicare Other | Admitting: Nurse Practitioner

## 2013-02-24 ENCOUNTER — Other Ambulatory Visit (HOSPITAL_BASED_OUTPATIENT_CLINIC_OR_DEPARTMENT_OTHER): Payer: Medicare Other | Admitting: Lab

## 2013-02-24 ENCOUNTER — Telehealth: Payer: Self-pay | Admitting: *Deleted

## 2013-02-24 VITALS — BP 121/67 | HR 73 | Temp 98.4°F | Resp 20 | Ht 67.0 in | Wt 140.3 lb

## 2013-02-24 DIAGNOSIS — Z86718 Personal history of other venous thrombosis and embolism: Secondary | ICD-10-CM

## 2013-02-24 DIAGNOSIS — I639 Cerebral infarction, unspecified: Secondary | ICD-10-CM

## 2013-02-24 DIAGNOSIS — Z95828 Presence of other vascular implants and grafts: Secondary | ICD-10-CM

## 2013-02-24 DIAGNOSIS — D6859 Other primary thrombophilia: Secondary | ICD-10-CM

## 2013-02-24 DIAGNOSIS — I63432 Cerebral infarction due to embolism of left posterior cerebral artery: Secondary | ICD-10-CM

## 2013-02-24 DIAGNOSIS — Z7901 Long term (current) use of anticoagulants: Secondary | ICD-10-CM

## 2013-02-24 DIAGNOSIS — I6340 Cerebral infarction due to embolism of unspecified cerebral artery: Secondary | ICD-10-CM

## 2013-02-24 DIAGNOSIS — C569 Malignant neoplasm of unspecified ovary: Secondary | ICD-10-CM

## 2013-02-24 DIAGNOSIS — D696 Thrombocytopenia, unspecified: Secondary | ICD-10-CM

## 2013-02-24 LAB — COMPREHENSIVE METABOLIC PANEL (CC13)
ALT: 214 U/L — ABNORMAL HIGH (ref 0–55)
AST: 152 U/L — ABNORMAL HIGH (ref 5–34)
Albumin: 3 g/dL — ABNORMAL LOW (ref 3.5–5.0)
Alkaline Phosphatase: 343 U/L — ABNORMAL HIGH (ref 40–150)
Calcium: 9.8 mg/dL (ref 8.4–10.4)
Chloride: 103 mEq/L (ref 98–109)
Creatinine: 0.7 mg/dL (ref 0.6–1.1)
Potassium: 4.5 mEq/L (ref 3.5–5.1)
Sodium: 136 mEq/L (ref 136–145)

## 2013-02-24 LAB — CBC WITH DIFFERENTIAL/PLATELET
BASO%: 0.7 % (ref 0.0–2.0)
EOS%: 0.4 % (ref 0.0–7.0)
MCH: 29.6 pg (ref 25.1–34.0)
MCHC: 33 g/dL (ref 31.5–36.0)
MCV: 89.6 fL (ref 79.5–101.0)
MONO%: 16.8 % — ABNORMAL HIGH (ref 0.0–14.0)
RBC: 3.24 10*6/uL — ABNORMAL LOW (ref 3.70–5.45)
RDW: 21 % — ABNORMAL HIGH (ref 11.2–14.5)
lymph#: 0.9 10*3/uL (ref 0.9–3.3)

## 2013-02-24 LAB — PROTIME-INR
INR: 2.7 (ref 2.00–3.50)
Protime: 32.4 Seconds — ABNORMAL HIGH (ref 10.6–13.4)

## 2013-02-24 LAB — TECHNOLOGIST REVIEW

## 2013-02-24 LAB — POCT INR: INR: 2.7

## 2013-02-24 MED ORDER — HEPARIN SOD (PORK) LOCK FLUSH 100 UNIT/ML IV SOLN
500.0000 [IU] | Freq: Once | INTRAVENOUS | Status: AC
  Administered 2013-02-24: 250 [IU] via INTRAVENOUS
  Filled 2013-02-24: qty 5

## 2013-02-24 MED ORDER — SODIUM CHLORIDE 0.9 % IJ SOLN
10.0000 mL | INTRAMUSCULAR | Status: DC | PRN
  Administered 2013-02-24: 10 mL via INTRAVENOUS
  Filled 2013-02-24: qty 10

## 2013-02-24 NOTE — Progress Notes (Signed)
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 She continues gemzar, cycle 5 given 02-17-13. She continues coumadin and ASA for hypercoagulable state, followed by [facility] coumadin clinic. Marland Kitchen She has power PICC in. Patient continues to tolerate the gemzar generally well, without marked fatigue, no nausea or vomiting, and platelets >100k with dose reductions (this particularly an issue in her due to recurrent embolic CVAs and need to maintain anticoagulation). She had an episode of mid abdominal pain on 02-20-13 which was fairly intense, lasted 12 hours, no associated vomiting, resolved after she took zofran; this was similar to symptoms in the few months prior to the ovarian cancer diagnosis. Only other abdominal/ pelvic discomfort is occasional RUQ as she has had since diagnosis of the ovarian cancer.  Last CT AP was 11-19-12 and no improvement in CA 125 as of 02-03-13 but options for treatment of this platinum resistant disease are limited due to recurrent embolic CVAs and cardiomyopathy. Patient wants to continue gemzar as long as that seems reasonable.   ONCOLOGIC HISTORY Patient developed constipation with some right abdominal pain in May 2013. She saw GI physician in ~ Aug 2013, but was unable to tolerate prep for colonoscopy such that this was never accomplished. She developed LLE superficial phlebitis in Jan 2014 (dopplers by PCP negative for DVT then), followed by swelling and discomfort LLE with venous doppler at [facility] in [facility] on 05-21-12 with acute thrombus left common femoral vein. 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 She was begun on lovenox and transitioned to Xarelto by Dr Everlene Other. CA 125 was 569 initially. She saw Dr Duard Brady on 06-12-12, exam remarkable for distended abdomen with fluid wave and 15 cm fixed mass in pelvis. 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 Neoadjuvant dose dense carbo/taxol was beun 06-21-12 with 3 cycles given thru 08-16-12. She needed neupogen days 2,8 and 16 on this regimen. She had good partial response by CTs 08-19-12 and had improvement in CA 125 from 1041 as baseline for the chemotherapy in late Feb 2014 down to 92 on 08-21-12 after 3 cycles. She had IVC filter placed prior to gyn oncology surgery. She went to exploratory laparotomy with extensive lysis of adhesions, left ureteral lysis, and bilateral salpingo-oophorectomy by Dr Duard Brady at [facility] on 09-10-12, which was suboptimal debulking. Operative findings: Diffuse carcinomatosis of the residual omentum involving the entire transverse colon. Transverse colon densely  adherent to the anterior abdominal wall. No disease throughout the small bowel. Mesenteric adenopathy with lymph nodes measuring 1 cm, involving the root of the small bowel mesentery. ~5 centimeter right ovarian mass  and ~ 6 cm left ovarian mass. Small bowel densely adherent to the pelvis. 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 She resumed chemotherapy with dose dense taxol carboplatin on 09-27-12, completing cycle 5 on 11-08-12. CA 125 increased from 92 in April to 130 post op in June, then 225 late July and repeat 318. CT CAP done 11-19-12 showed increase in nodular thickening along falciform ligament and small peritoneal nodule right iliac fossa. Dr Duard Brady recommended change in chemotherapy to doxil with avastin, however echocardiogram 11-27-12 unexpectedly had EF of 15%. She received one cycle of gemzar on 12-02-12, given peripherally, then had embolic CVAs on 12-21-54 while still on xarelto. She was hospitalized 8-13 thru 12-11-12, had some bleeding into the embolic CVA on 12-05-12, and treated with heparin transitioned to coumadin. She was stable to resume gemzar with cycle 2 at 800 mg/m2 given on 12-30-12,then platelets down to 70k by day 8. She had progressive superficial thrombophlebitis LE on coumadin by 12-25-12 and was changed to full dose lovenox then; there has been difficulty getting lovenox covered by insurance. Cycle 3 gemzar 01-13-13 was dose reduced to 600 mg/m2, however platelets still dropped to 90k; she had recurrent embolic CVAs on 06-06-06 and was hospitalized thru 01-28-13, treated with continuous heparin transitioned back to coumadin, plus baby ASA daily. Gemzar dose has been decreased to avoid thrombocytopenia, as she need continuous anticoagulation if possible.   Review of systems as above, also: Only visual problem now is occasional floaters OD, no other new or different  neurologic symptoms including worsening expressive aphasia, weakness or HA.No bleeding,no painful superficial varicosities. Is wearing knee hi teds regularly. Remainder of 10 point Review of Systems negative.  Objective:  Vital signs in last 24 hours:  BP 121/67  Pulse 73  Temp(Src) 98.4 F (36.9 C) (Oral)  Resp 20  Ht 5\' 7"  (1.702 m)  Wt 140 lb 4.8 oz (63.64 kg)  BMI 21.97 kg/m2 Weight is up 2 lbs. Alert, oriented and appropriate. Ambulatory without assistance. She looks remarkably good today. Alopecia  HEENT:PERRL, sclerae not icteric. Oral mucosa moist without lesions, posterior pharynx clear.  Neck supple. No JVD.  Lymphatics:no cervical,suraclavicular, axillary or inguinal adenopathy Resp: clear to auscultation bilaterally and normal percussion bilaterally Cardio: regular rate and rhythm. No gallop. GI: soft, nontender, not distended, no mass or organomegaly.Bowel sounds present and normally active. Surgical incision not remarkable. Musculoskeletal/ Extremities: without pitting edema, cords, tenderness. Back not tender. Minimal prominent varicosities bilateral calves palpable thru Teds, not tender. Neuro: Speech almost entirely fluent. No obvious focal deficits CN, motor, sensory, cerebellar Skin without rash, ecchymosis, petechiae PICC right upper arm dressing changed today, site good, no tenderness or swelling of arm and functioning well.  Lab Results:  Results for orders placed in visit on 02/24/13  POCT INR      Result Value Range   INR 2.7    agree with pharmacist recommendation to continue coumadin at 5 mg daily until next check 03-03-13.    Other labs today: CBC with WBC  3.8, ANC 2.2, Hgb a little lower at 9.6, plt 127k CMET with new elevation of AP to 343, AST to 152, ALT to 214, otherwise alb 3.0, Ca 9.8, electrolytes fine and creat 0.7 CA 125 is a little lower/stable at 2330, having been 2630 on 10-13 and 2220 in Sept.    Studies/Results:  No results  found. Last CT AP did not show cholelithiasis   Medications: I have reviewed the patient's current medications.  DISCUSSION: not clear if episode of abdominal pain last week is related to increase in LFTs. Will repeat chemistries with treatment 03-03-13, may need to repeat CT depending on results. She should call if recurrent significant or prolonged abdominal pain.  Assessment/Plan: 1.Advanced stage high grade serous bilateral ovarian carcinoma: history as above, cycle 5 gemzar given 02-17-13, dose adjusted to try to avoid significant thrombocytopenia as we hope to continue therapeutic anticoagulation in this complicated patient. She will have #6 gemzar on 03-03-13 if ANC >=1.5 and plt >=120k and if otherwise stable. I will see her on 03-10-13. CA125 may be leveling off somewhat (with erratic chemo course to date).  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 IVC filter in. Underlying etiology for hypercoagulable state likely the advanced gyn cancer. Very difficult situation. SQ heparin would be next choice if needed, but actually doing well on coumadin + ASA at present. We are trying to avoid low platelets from chemo. [facility] coumadin clinic assistance appreciated. Follow up neurology 02-27-13. 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 Follow up with Dr Katrinka Blazing 03-11-13. 4.episode of abdominal pain and increase in LFTs: see plan above. No symptoms presently. 5.Advance Directives and HCPOA completed. With advanced, resistant cancer, multiple CVAs and cardiac disease, I doubt life support would be beneficial overall.  6.power PICC in: new protocol is to flush with heparin twice weekly, which we will do at [facility] on  Mondays and Thursdays.  7. flu vaccine done 8.long tobacco now discontinued   Patient is in agreement with plan above.    Kemoni Quesenberry P, MD   02/24/2013, 11:45 AM

## 2013-02-24 NOTE — Progress Notes (Signed)
INR at goal Patient is doing well with no complaints or issues No complications regarding anticoagulation No missed/extra doses Pt states she did have broccoli last night but a small serving No medication changes Pt is here to see Dr. Darrold Span and to have flush/dressing change Pt to return in 1 week. We will continue same coumadin regimen Plan:  Continue Coumadin at 5 mg daily.   Recheck INR on 03/03/13 with other scheduled appointments we will see her in infusion for treatment

## 2013-02-24 NOTE — Telephone Encounter (Signed)
Per staff message and POF I have scheduled appts.  JMW  

## 2013-02-24 NOTE — Telephone Encounter (Signed)
appts made and printed. Pt is aware that tx will be added. i emailed MW to add the tx...td 

## 2013-02-24 NOTE — Patient Instructions (Signed)
We will repeat chemistries from [facility] with treatment on 03-03-13

## 2013-02-24 NOTE — Patient Instructions (Signed)
INR at goal Continue Coumadin at 5 mg daily.   Recheck INR on 03/03/13 with other scheduled appointments we will see you in infusion

## 2013-02-25 ENCOUNTER — Telehealth: Payer: Self-pay

## 2013-02-25 NOTE — Telephone Encounter (Signed)
Spoke with [name] and gave her the information noted below regarding her labs and treatment.   Monique [name] verbalized understanding.

## 2013-02-25 NOTE — Telephone Encounter (Signed)
Message copied by Lorine Bears on Tue Feb 25, 2013  2:29 PM ------      Message from: Reece Packer      Created: Tue Feb 25, 2013 12:55 PM       Please let her know the CA 125 from 11-3 was stable/ a little lower at 2330. If she has recurrent severe or prolonged abdominal pain she should call, but otherwise I am ok with treatment and repeating chemistries on 11-10 as planned      thanks      Cc LA, TH, RW-S ------

## 2013-02-27 ENCOUNTER — Ambulatory Visit (INDEPENDENT_AMBULATORY_CARE_PROVIDER_SITE_OTHER): Payer: Medicare Other | Admitting: Nurse Practitioner

## 2013-02-27 ENCOUNTER — Encounter: Payer: Self-pay | Admitting: Nurse Practitioner

## 2013-02-27 VITALS — BP 107/51 | HR 50 | Resp 18 | Ht 67.0 in | Wt 135.0 lb

## 2013-02-27 DIAGNOSIS — D6859 Other primary thrombophilia: Secondary | ICD-10-CM

## 2013-02-27 DIAGNOSIS — I639 Cerebral infarction, unspecified: Secondary | ICD-10-CM

## 2013-02-27 DIAGNOSIS — Z86718 Personal history of other venous thrombosis and embolism: Secondary | ICD-10-CM

## 2013-02-27 DIAGNOSIS — I6350 Cerebral infarction due to unspecified occlusion or stenosis of unspecified cerebral artery: Secondary | ICD-10-CM

## 2013-02-27 NOTE — Patient Instructions (Signed)
  Continue aspirin 81 mg orally every day and warfarin  for secondary stroke prevention and maintain strict control of hypertension with blood pressure goal below 130/90,  and lipids with LDL cholesterol goal below 100 mg/dL.  Followup in 6 months.  STROKE/TIA INSTRUCTIONS SMOKING Cigarette smoking nearly doubles your risk of having a stroke & is the single most alterable risk factor  If you smoke or have smoked in the last 12 months, you are advised to quit smoking for your health.  Most of the excess cardiovascular risk related to smoking disappears within a year of stopping.  Ask you doctor about anti-smoking medications  [place] Quit Line: 1-800-QUIT NOW  Free Smoking Cessation Classes [phone]  CHOLESTEROL Know your levels; limit fat & cholesterol in your diet  Lab Results  Component Value Date   CHOL 149 01/22/2013   HDL 35* 01/22/2013   LDLCALC 90 01/22/2013   TRIG 122 01/22/2013   CHOLHDL 4.3 01/22/2013      Many patients benefit from treatment even if their cholesterol is at goal.  Goal: Total Cholesterol less than 160  Goal:  LDL less than 100  Goal:  HDL greater than 40  Goal:  Triglycerides less than 150  BLOOD PRESSURE American Stroke Association blood pressure target is less that 120/80 mm/Hg  Your discharge blood pressure is:  BP: 107/51 mmHg  Monitor your blood pressure  Limit your salt and alcohol intake  Many individuals will require more than one medication for high blood pressure  DIABETES (A1c is a blood sugar average for last 3 months) Goal A1c is under 7% (A1c is blood sugar average for last 3 months)  Diabetes: No known diagnosis of diabetes    Lab Results  Component Value Date   HGBA1C 5.9* 01/22/2013    Your A1c can be lowered with medications, healthy diet, and exercise.  Check your blood sugar as directed by your physician  Call your physician if you experience unexplained or low blood sugars.  PHYSICAL ACTIVITY/REHABILITATION Goal is 30  minutes at least 4 days per week    Activity decreases your risk of heart attack and stroke and makes your heart stronger.  It helps control your weight and blood pressure; helps you relax and can improve your mood.  Participate in a regular exercise program.  Talk with your doctor about the best form of exercise for you (dancing, walking, swimming, cycling).  DIET/WEIGHT Goal is to maintain a healthy weight  Your height is:  Height: 5\' 7"  (170.2 cm) Your current weight is: Weight: 135 lb (61.236 kg) Your body Mass Index (BMI) is:  BMI (Calculated): 21.2  Following the type of diet specifically designed for you will help prevent another stroke.  Your goal Body Mass Index (BMI) is 19-24.  Healthy food habits can help reduce 3 risk factors for stroke:  High cholesterol, hypertension, and excess weight.

## 2013-02-27 NOTE — Progress Notes (Signed)
[facility]  PATIENT: Monique [name] DOB: March 13, 1946   HISTORY FROM: patient, chart REASON FOR VISIT: routine post-hospital stroke follow up  HISTORY OF PRESENT ILLNESS:  01/22/13  Monique [name] is an 67 y.o. 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  She said that she her Lovenox was resumed 8 days ago and yesterday after her Lovenox shot she was talking on the phone with her cancer nurse when suddenly started having slurred speech and was unable to speak for few seconds. She was asked by the nurse to present to the ED for further evaluation where she had a brain MRI that disclosed several small infarcts in the right peri-Rolandic cortex and right cerebellar hemisphere, acute to subacute in appearance.  She denies associated HA, vertigo, double vision, difficulty swallowing, focal weakness or numbness, confusion.  Monique [name] states that her speech and language are improving.  Prior stroke last August occurred while on xarelto and she was heparinized in the acute stroke setting. She was discharged on coumadin (did not clinical cerebrovascular events while on coumadin for few weeks), then switched to Lovenox. Stroke work up carried out at that time was unrevealing.   UPDATE 02/27/13 (LL): Monique [name] comes to office for post-stroke hospital follow-up.  She states she bumped her  forehead in garage before coming to appointment, and is holding a cold-pack to her head.  She is taking Coumadin and baby aspirin daily for hypercoagulable state.  She states she is doing "ok" other than bumping her head, doesn't have much residual effect of stroke, except some  transient aphasia.  Her BP is well controlled.  She is worried that coumadin will not prevent another stroke from happening.  REVIEW OF SYSTEMS: Full 14 system review of systems performed and notable only for: Hematology/Lymph: easy bleeding, easy bruising  ALLERGIES: Allergies  Allergen Reactions  . Septra [Sulfamethoxazole-Tmp Ds] Rash and Other (See Comments)    High fever, Burning skin  . Ampicillin Itching  . Fentanyl And Related Hives    Per Dr. Acey Lav, pt reports she is ok with fentanyl (09/10/12). Patient states it was epidural form of Fentanyl that she reacted to.   . Neosporin [Neomycin-Bacitracin Zn-Polymyx] Swelling and Other (See Comments)    opthalmic solution only can use topically. Swelling and inflamed eyes    HOME MEDICATIONS: Outpatient Prescriptions Prior to Visit  Medication Sig Dispense Refill  . aspirin 81 MG tablet Take 1 tablet (81 mg total) by mouth daily.  30 tablet    . lisinopril (PRINIVIL,ZESTRIL) 2.5 MG tablet Take 1 tablet (2.5 mg total) by mouth daily.  30 tablet  5  . metoprolol succinate (TOPROL-XL) 25 MG 24 hr tablet Take 12.5 mg by mouth 2 (two) times daily.      . pantoprazole (PROTONIX) 40 MG tablet Take 1 tablet (40 mg total) by mouth daily.  30 tablet  1  . spironolactone (ALDACTONE) 25 MG tablet Take 1 tablet (25 mg total) by mouth daily.  30 tablet  5  . warfarin (COUMADIN) 5 MG tablet Take 5 mg by mouth daily.      . polyethylene glycol (MIRALAX / GLYCOLAX) packet Take 17 g by mouth daily as needed (for constipation).       Marland Kitchen  sennosides-docusate sodium (SENOKOT-S) 8.6-50 MG tablet Take 1-2 tablets by mouth daily as needed for constipation.      Marland Kitchen LORazepam (ATIVAN) 0.5 MG tablet Take 0.5 mg by mouth every 8 (eight) hours as needed for anxiety (takes after Chemo treatment).       Marland Kitchen oxyCODONE-acetaminophen (PERCOCET/ROXICET) 5-325 MG per tablet Take 1 tablet by mouth every 4 (four) hours as needed for pain.        Facility-Administered  Medications Prior to Visit  Medication Dose Route Frequency Provider Last Rate Last Dose  . sodium chloride 0.9 % injection 10 mL  10 mL Intracatheter PRN Lennis Buzzy Han, MD   10 mL at 02/17/13 1158    PAST MEDICAL HISTORY: Past Medical History  Diagnosis Date  . Acute venous embolism and thrombosis of unspecified deep vessels of lower extremity   . Bronchitis     several times  . Abdominal or pelvic swelling, mass or lump, unspecified site   . Phlebitis and thrombophlebitis of unspecified site   . Pelvic mass   . GERD (gastroesophageal reflux disease)   . PONV (postoperative nausea and vomiting)     "when fentanyl is used, will break out in itchy rast"  . Basal cell carcinoma of face   . Ovarian cancer   . CHF (congestive heart failure)     "just since 11/2012; they think it's from the chemo" (01/21/2013)  . 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 DVT (deep venous thrombosis) 04/2012    "BLE" (01/21/2013)  . Chronic bronchitis     "I had it several times; I used to smoke" (01/21/2013)  . Shortness of breath     "trouble taking a deep breath at times before RX started; I'm fine now" (01/21/2013)  . Anemia     "because of the chemo" (01/21/2013)  . Arthritis     "little in my right hand" (01/21/2013)  . Anxiety     "related to ovarian cancer; don't take RX for it" (01/21/2013)    PAST SURGICAL HISTORY: Past Surgical History  Procedure Laterality Date  . Appendectomy  1966    ruptured, hospital for 3 weeks  . Rhinoplasty  1976  . Vein ligation and stripping Right 1998    leg  . Mohs surgery  2004, 2005    basal cell of the face  . Tooth extraction  03/2011?    "just one; had a root canal that got infected" (01/21/2013)  . Laparotomy N/A 09/10/2012    Procedure: EXPLORATORY LAPAROTOMY, BILATERAL SALPINGO OOPHORECTOMY, TUMOR DEBULKING;  Surgeon: Rejeana Brock A. Duard Brady, MD;  Location: [facility];  Service: Gynecology;  Laterality: N/A;  .  Exploratory laparotomy  09/10/12    Lysis of adhesions, BSO, suboptimal tumor debulking  . Vena cava filter placement  08/2012    FAMILY HISTORY: Family History  Problem Relation Age of Onset  . Pneumonia Mother   . Heart attack Father     SOCIAL HISTORY: History   Social History  . Marital Status: Single    Spouse Name: N/A    Number of Children: N/A  . Years of Education: N/A   Occupational History  . Not on file.   Social History Main Topics  . Smoking status: Former Smoker -- 30 years    Types: Cigarettes    Quit date: 04/20/2012  . Smokeless tobacco: Never Used  . Alcohol Use: Yes     Comment: 01/21/2013 "long time ago  I was a social drinker"  . Drug Use: No  . Sexual Activity: Not Currently   Other Topics Concern  . Not on file   Social History Narrative  . No narrative on file     PHYSICAL EXAM  Filed Vitals:   02/27/13 1432  BP: 107/51  Pulse: 50  Resp: 18  Height: 5\' 7"  (1.702 m)  Weight: 135 lb (61.236 kg)   Body mass index is 21.14 kg/(m^2).  Generalized: In no acute distress, older Caucasian lady Neck: Supple, no carotid bruits  Cardiac: Regular rate rhythm, no murmur  Pulmonary: Clear to auscultation bilaterally  Musculoskeletal: Approximately 2" round superficial hematoma on right forehead developing from bump on head earlier  Neurological examination  Mentation: Alert oriented to time, place, history taking,  Cranial nerve II-XII: Pupils were equal round reactive to light extraocular movements were full, visual field were full on confrontational test. facial sensation and strength were normal. hearing was intact to finger rubbing bilaterally. Uvula tongue midline. head turning and shoulder shrug and were normal and symmetric.Tongue protrusion into cheek strength was normal. MOTOR: normal bulk and tone, full strength in the BUE, BLE, fine finger movements normal, no pronator drift SENSORY: normal and symmetric to light touch COORDINATION:  finger-nose-finger, heel-to-shin bilaterally, there was no truncal ataxia REFLEXES: Brachioradialis 2/2, biceps 2/2, triceps 2/2, patellar 2/2, Achilles 2/2, plantar responses were flexor bilaterally. GAIT/STATION: Rising up from seated position without assistance, normal stance, without trunk ataxia, moderate stride, good arm swing, smooth turning, able to perform tiptoe, and heel walking without difficulty.    DIAGNOSTIC DATA (LABS, IMAGING, TESTING) - I reviewed patient records, labs, notes, testing and imaging myself where available.  Lab Results  Component Value Date   WBC 3.8* 02/24/2013   HGB 9.6* 02/24/2013   HCT 29.0* 02/24/2013   MCV 89.6 02/24/2013   PLT 127* 02/24/2013      Component Value Date/Time   NA 136 02/24/2013 1000   NA 139 01/21/2013 1147   K 4.5 02/24/2013 1000   K 3.8 01/22/2013 1600   CL 102 01/21/2013 1147   CL 104 10/15/2012 1043   CO2 23 02/24/2013 1000   CO2 24 01/21/2013 1140   GLUCOSE 96 02/24/2013 1000   GLUCOSE 97 01/21/2013 1147   GLUCOSE 114* 10/15/2012 1043   BUN 17.4 02/24/2013 1000   BUN 12 01/21/2013 1147   CREATININE 0.7 02/24/2013 1000   CREATININE 0.80 01/21/2013 1147   CALCIUM 9.8 02/24/2013 1000   CALCIUM 9.6 01/21/2013 1140   PROT 7.4 02/24/2013 1000   PROT 7.3 01/21/2013 1140   ALBUMIN 3.0* 02/24/2013 1000   ALBUMIN 3.2* 01/21/2013 1140   AST 152* 02/24/2013 1000   AST 31 01/21/2013 1140   ALT 214* 02/24/2013 1000   ALT 25 01/21/2013 1140   ALKPHOS 343* 02/24/2013 1000   ALKPHOS 110 01/21/2013 1140   BILITOT 0.76 02/24/2013 1000   BILITOT 0.2* 01/21/2013 1140   GFRNONAA >90 01/21/2013 1140   GFRAA >90 01/21/2013 1140   Lab Results  Component Value Date   CHOL 149 01/22/2013   HDL 35* 01/22/2013   LDLCALC 90 01/22/2013   TRIG 122 01/22/2013   CHOLHDL 4.3 01/22/2013   Lab Results  Component Value Date   HGBA1C 5.9* 01/22/2013    ASSESSMENT AND PLAN Monique [name] is a 67 y.o. female with ovarian cancer and chronic systolic heart failure who  presented with dysarthria and transient aphasia on 01/22/13. Imaging confirms a continued development of  small infarcts in the right peri-Rolandic cortex and right cerebellar hemisphere. Several small infarcts appear to be acute to early subacute. In addition, evolution of the previously-seen larger infarcts, including at the left frontal operculum and left cerebellar hemisphere.Infarct felt to be embolic secondary to hypercoagulable state.  Patient with resultant mild transient aphasia.    Continue aspirin 81 mg orally every day and warfarin  for secondary stroke prevention and maintain strict control of hypertension with blood pressure goal below 130/90,  and lipids with LDL cholesterol goal below 100 mg/dL.  Patient advised seek care for right forehead hematoma if she develops severe headache or decreased LOC this afternoon or evening.  Keep using cold pack on/off to decrease swelling. Followup in 6 months.  Alaney Witter, MSN, NP-C 02/27/2013, 2:37 PM [facility] [address], [address] [place], [place] [address] [phone]  Note: This document was prepared with digital dictation and possible smart phrase technology. Any transcriptional errors that result from this process are unintentional.  I have personally examined this patient, reviewed pertinent data, developed plan of care and discussed with patient and agree with above.  Delia Heady, MD

## 2013-03-03 ENCOUNTER — Ambulatory Visit (HOSPITAL_BASED_OUTPATIENT_CLINIC_OR_DEPARTMENT_OTHER): Payer: Medicare Other | Admitting: Pharmacist

## 2013-03-03 ENCOUNTER — Other Ambulatory Visit (HOSPITAL_BASED_OUTPATIENT_CLINIC_OR_DEPARTMENT_OTHER): Payer: Medicare Other

## 2013-03-03 ENCOUNTER — Ambulatory Visit (HOSPITAL_BASED_OUTPATIENT_CLINIC_OR_DEPARTMENT_OTHER): Payer: Medicare Other

## 2013-03-03 VITALS — BP 109/60 | HR 73 | Temp 98.3°F | Resp 18

## 2013-03-03 VITALS — BP 99/51 | HR 73 | Temp 98.1°F | Resp 18

## 2013-03-03 DIAGNOSIS — D6859 Other primary thrombophilia: Secondary | ICD-10-CM

## 2013-03-03 DIAGNOSIS — Z86718 Personal history of other venous thrombosis and embolism: Secondary | ICD-10-CM

## 2013-03-03 DIAGNOSIS — Z5111 Encounter for antineoplastic chemotherapy: Secondary | ICD-10-CM

## 2013-03-03 DIAGNOSIS — C569 Malignant neoplasm of unspecified ovary: Secondary | ICD-10-CM

## 2013-03-03 DIAGNOSIS — I63432 Cerebral infarction due to embolism of left posterior cerebral artery: Secondary | ICD-10-CM

## 2013-03-03 DIAGNOSIS — I639 Cerebral infarction, unspecified: Secondary | ICD-10-CM

## 2013-03-03 LAB — COMPREHENSIVE METABOLIC PANEL (CC13)
ALT: 65 U/L — ABNORMAL HIGH (ref 0–55)
Anion Gap: 9 mEq/L (ref 3–11)
BUN: 18.5 mg/dL (ref 7.0–26.0)
CO2: 22 mEq/L (ref 22–29)
Calcium: 10 mg/dL (ref 8.4–10.4)
Chloride: 101 mEq/L (ref 98–109)
Creatinine: 0.7 mg/dL (ref 0.6–1.1)
Glucose: 87 mg/dl (ref 70–140)
Total Bilirubin: 0.88 mg/dL (ref 0.20–1.20)

## 2013-03-03 LAB — CBC WITH DIFFERENTIAL/PLATELET
Basophils Absolute: 0 10*3/uL (ref 0.0–0.1)
Eosinophils Absolute: 0 10*3/uL (ref 0.0–0.5)
HCT: 31.3 % — ABNORMAL LOW (ref 34.8–46.6)
HGB: 10.2 g/dL — ABNORMAL LOW (ref 11.6–15.9)
LYMPH%: 13 % — ABNORMAL LOW (ref 14.0–49.7)
MCH: 29.1 pg (ref 25.1–34.0)
MCV: 89.2 fL (ref 79.5–101.0)
MONO#: 1.3 10*3/uL — ABNORMAL HIGH (ref 0.1–0.9)
MONO%: 14.1 % — ABNORMAL HIGH (ref 0.0–14.0)
NEUT#: 6.4 10*3/uL (ref 1.5–6.5)
NEUT%: 71.9 % (ref 38.4–76.8)
Platelets: 259 10*3/uL (ref 145–400)
RBC: 3.51 10*6/uL — ABNORMAL LOW (ref 3.70–5.45)
WBC: 8.9 10*3/uL (ref 3.9–10.3)

## 2013-03-03 LAB — POCT INR: INR: 2.1

## 2013-03-03 LAB — PROTIME-INR: Protime: 25.2 Seconds — ABNORMAL HIGH (ref 10.6–13.4)

## 2013-03-03 MED ORDER — HEPARIN SOD (PORK) LOCK FLUSH 100 UNIT/ML IV SOLN
500.0000 [IU] | Freq: Once | INTRAVENOUS | Status: AC | PRN
  Administered 2013-03-03: 250 [IU]
  Filled 2013-03-03: qty 5

## 2013-03-03 MED ORDER — SODIUM CHLORIDE 0.9 % IV SOLN
Freq: Once | INTRAVENOUS | Status: AC
  Administered 2013-03-03: 12:00:00 via INTRAVENOUS

## 2013-03-03 MED ORDER — ONDANSETRON 8 MG/50ML IVPB (CHCC)
8.0000 mg | Freq: Once | INTRAVENOUS | Status: AC
  Administered 2013-03-03: 8 mg via INTRAVENOUS

## 2013-03-03 MED ORDER — ONDANSETRON 8 MG/NS 50 ML IVPB
INTRAVENOUS | Status: AC
  Filled 2013-03-03: qty 8

## 2013-03-03 MED ORDER — ACETAMINOPHEN 325 MG PO TABS
650.0000 mg | ORAL_TABLET | Freq: Once | ORAL | Status: AC
  Administered 2013-03-03: 650 mg via ORAL

## 2013-03-03 MED ORDER — DEXAMETHASONE SODIUM PHOSPHATE 10 MG/ML IJ SOLN
4.0000 mg | Freq: Once | INTRAMUSCULAR | Status: AC
  Administered 2013-03-03: 4 mg via INTRAVENOUS

## 2013-03-03 MED ORDER — DEXAMETHASONE SODIUM PHOSPHATE 20 MG/5ML IJ SOLN
INTRAMUSCULAR | Status: AC
  Filled 2013-03-03: qty 5

## 2013-03-03 MED ORDER — SODIUM CHLORIDE 0.9 % IJ SOLN
10.0000 mL | INTRAMUSCULAR | Status: DC | PRN
  Administered 2013-03-03: 10 mL via INTRAVENOUS
  Filled 2013-03-03: qty 10

## 2013-03-03 MED ORDER — ACETAMINOPHEN 325 MG PO TABS
ORAL_TABLET | ORAL | Status: AC
  Filled 2013-03-03: qty 2

## 2013-03-03 MED ORDER — SODIUM CHLORIDE 0.9 % IV SOLN
500.0000 mg/m2 | Freq: Once | INTRAVENOUS | Status: AC
  Administered 2013-03-03: 874 mg via INTRAVENOUS
  Filled 2013-03-03: qty 22.99

## 2013-03-03 MED ORDER — HEPARIN SOD (PORK) LOCK FLUSH 100 UNIT/ML IV SOLN
500.0000 [IU] | Freq: Once | INTRAVENOUS | Status: AC
  Administered 2013-03-03: 250 [IU] via INTRAVENOUS
  Filled 2013-03-03: qty 5

## 2013-03-03 MED ORDER — SODIUM CHLORIDE 0.9 % IJ SOLN
10.0000 mL | INTRAMUSCULAR | Status: DC | PRN
  Administered 2013-03-03: 10 mL
  Filled 2013-03-03: qty 10

## 2013-03-03 NOTE — Progress Notes (Signed)
INR = 2.1 on Coumadin 5 mg daily Pt has "lumps" under skin on her LLE/calf.  She states they are sore today.  Dr. Darrold Span aware. Pt also has cellulitis RUE/inner thigh.  Redness is worse today but it "comes & goes."  No fever. Pt will take Ibuprofen 2 times daily x 2 days (per MD direction). INR at goal.  Pt is a little worried that it is low therapeutic.  I s/w Dr. Darrold Span & we'll keep her Coumadin dose at 5 mg daily esp since pt will be taking Ibuprofen for the next couple of days (along w/ ASA). Repeat INR in 2 weeks & we'll plan to see her in infusion. Ebony Hail, Pharm.D., CPP 03/03/2013@12 :25 PM

## 2013-03-03 NOTE — Progress Notes (Signed)
Pt states she has cellulitis in her interior rt thigh. She states onset was on 02/27/13. She states Dr Darrold Span is aware and instructed pt to use warm compresses along with Ibuprofen twice a day. Pt states she is taking Ibuprofen 400 mg twice a day and is using warm compresses on affected site. She denies any fevers. Pt states site is improving. Pt encouraged to call clinic with any concerns or worsening of symptoms. Pt verbalized understanding.

## 2013-03-03 NOTE — Patient Instructions (Signed)
[facility] Discharge Instructions for Patients Receiving Chemotherapy  Today you received the following chemotherapy agents Gemzar To help prevent nausea and vomiting after your treatment, we encourage you to take your nausea medication  As per Dr. Livesay.    If you develop nausea and vomiting that is not controlled by your nausea medication, call the clinic.   BELOW ARE SYMPTOMS THAT SHOULD BE REPORTED IMMEDIATELY:  *FEVER GREATER THAN 100.5 F  *CHILLS WITH OR WITHOUT FEVER  NAUSEA AND VOMITING THAT IS NOT CONTROLLED WITH YOUR NAUSEA MEDICATION  *UNUSUAL SHORTNESS OF BREATH  *UNUSUAL BRUISING OR BLEEDING  TENDERNESS IN MOUTH AND THROAT WITH OR WITHOUT PRESENCE OF ULCERS  *URINARY PROBLEMS  *BOWEL PROBLEMS  UNUSUAL RASH Items with * indicate a potential emergency and should be followed up as soon as possible.  Feel free to call the clinic you have any questions or concerns. The clinic phone number is [phone].    

## 2013-03-04 ENCOUNTER — Telehealth: Payer: Self-pay | Admitting: Pharmacist

## 2013-03-04 ENCOUNTER — Telehealth: Payer: Self-pay

## 2013-03-04 ENCOUNTER — Telehealth: Payer: Self-pay | Admitting: *Deleted

## 2013-03-04 DIAGNOSIS — C569 Malignant neoplasm of unspecified ovary: Secondary | ICD-10-CM

## 2013-03-04 NOTE — Telephone Encounter (Signed)
Received a call in Coumadin Clinic today from Monique [name].  Patient would like to have her Coumadin Clinic appointment moved up from 03/17/13 to 03/10/13.  Patient states that her INR was on the low end of target range last time.  Per patient, MD would like to keep her on the higher end of goal.  Will send message to schedulers to move appt to 03/10/13.  Patient is already scheduled to have lab at 1:30 pm and see Dr. Darrold Span at 2:00 pm.  Will try to schedule Coumadin Clinic for 1:45 pm.  Lillia Pauls, PharmD Clinical Pharmacist 03/04/2013 3:41 PM

## 2013-03-04 NOTE — Telephone Encounter (Signed)
Told Monique [name] that Dr. Darrold Span said that she wants her to begin Keflex 500 mg tid x 7 days.  Keep applying warm soaks to the affected area.   Take 7.5 mg of coumadin this evening. Dr. Darrold Span wants to see her tomorrow at 1515 with lab to evaluate the cellulitis.   Told Monique [name] that her liver function tests were better yesterday.  She got a copy at her treatment yesterday. Monique [name] is to go to the ED if she is feels worse or runs a temp of 100.5 or higher.  Pt. Verbalized understanding.

## 2013-03-04 NOTE — Telephone Encounter (Signed)
THE REDNESS IS SPREADING DOWN THE RIGHT THIGH. IT STARTS TWO INCHES BELOW GROIN AND CONTINUE TO TWO INCHES ABOVE CREASE BEHIND KNEE. THERE ARE "LUMPS ALONG THE VEIN". PT. HAD A MOIST WARM COMPRESS ON HER THIGH BUT REMOVE IT AND NOW HAS A COOL PACK. THE PAIN IN THE RIGHT THIGH IS LESS AT A FOUR. PT. TOOK THE TWO DOSES OF IBUPROFEN 400MG  YESTERDAY. SHE TOOK ANOTHER DOSE OF IBUPROFEN 400MG  THIRTY MINUTES AGO.

## 2013-03-05 ENCOUNTER — Encounter: Payer: Self-pay | Admitting: Oncology

## 2013-03-05 ENCOUNTER — Ambulatory Visit (HOSPITAL_BASED_OUTPATIENT_CLINIC_OR_DEPARTMENT_OTHER): Payer: Medicare Other | Admitting: Oncology

## 2013-03-05 ENCOUNTER — Other Ambulatory Visit (HOSPITAL_BASED_OUTPATIENT_CLINIC_OR_DEPARTMENT_OTHER): Payer: Medicare Other | Admitting: Lab

## 2013-03-05 VITALS — BP 122/69 | HR 81 | Temp 98.9°F | Resp 20 | Ht 67.0 in | Wt 143.4 lb

## 2013-03-05 DIAGNOSIS — C569 Malignant neoplasm of unspecified ovary: Secondary | ICD-10-CM

## 2013-03-05 DIAGNOSIS — Z7901 Long term (current) use of anticoagulants: Secondary | ICD-10-CM

## 2013-03-05 DIAGNOSIS — I8003 Phlebitis and thrombophlebitis of superficial vessels of lower extremities, bilateral: Secondary | ICD-10-CM | POA: Insufficient documentation

## 2013-03-05 DIAGNOSIS — I8000 Phlebitis and thrombophlebitis of superficial vessels of unspecified lower extremity: Secondary | ICD-10-CM

## 2013-03-05 LAB — CBC WITH DIFFERENTIAL/PLATELET
BASO%: 0.2 % (ref 0.0–2.0)
Basophils Absolute: 0 10*3/uL (ref 0.0–0.1)
Eosinophils Absolute: 0 10*3/uL (ref 0.0–0.5)
HGB: 9.3 g/dL — ABNORMAL LOW (ref 11.6–15.9)
LYMPH%: 11.2 % — ABNORMAL LOW (ref 14.0–49.7)
MCH: 29.1 pg (ref 25.1–34.0)
MCV: 89.4 fL (ref 79.5–101.0)
MONO%: 4.8 % (ref 0.0–14.0)
NEUT#: 3.8 10*3/uL (ref 1.5–6.5)
Platelets: 245 10*3/uL (ref 145–400)
RBC: 3.2 10*6/uL — ABNORMAL LOW (ref 3.70–5.45)
lymph#: 0.5 10*3/uL — ABNORMAL LOW (ref 0.9–3.3)

## 2013-03-05 LAB — PROTIME-INR: INR: 3.1 (ref 2.00–3.50)

## 2013-03-05 NOTE — Progress Notes (Signed)
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 She noticed redness right upper inner thigh on 11-7, which had progressed with slight superficial cord by 11-10. INR on 11-10 was 2.1 with no adjustment then in coumadin and addition of ibuprofen and warm soaks. By 11-11 the erythema of thigh had increased such that Keflex was begun and extra 2.5 mg coumadin given (ibuprofen DCd). She has had no fever, no bleeding and no other symptoms of clots. She has had no new or different neurologic symptoms. She feels that the erythema and associated cord are slightly improved as compared with yesterday. She has taken 3 doses of Keflex and had one episode of diarrhea. She had 6th cycle of single agent gemzar on 03-03-13 via RUE PICC, which has been functioning without difficulty.    ONCOLOGIC HISTORY Patient developed constipation with some right abdominal pain in May 2013. She saw GI physician in ~ Aug 2013, but was unable to tolerate prep for colonoscopy such that this was never accomplished. She developed LLE superficial phlebitis in Jan 2014 (dopplers by PCP negative for DVT then), followed by swelling and discomfort LLE with venous doppler at [facility] in [facility] on 05-21-12 with acute thrombus left common femoral vein. 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 She was begun on lovenox and transitioned to Xarelto by Dr Everlene Other. CA 125 was 569 initially. She saw Dr Duard Brady on 06-12-12, exam remarkable for distended abdomen with fluid wave and 15 cm fixed mass in pelvis. 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 Neoadjuvant dose dense carbo/taxol was beun 06-21-12 with 3 cycles given thru 08-16-12. She needed neupogen days 2,8 and 16 on this regimen. She had good partial response by CTs 08-19-12 and had improvement in CA 125 from 1041 as baseline for the chemotherapy in late Feb 2014 down to 92 on 08-21-12 after 3 cycles. She had IVC filter placed prior to gyn oncology surgery. She went to exploratory laparotomy with extensive lysis of adhesions, left ureteral lysis, and bilateral salpingo-oophorectomy by Dr Duard Brady on 09-10-12,  suboptimal debulking. Operative findings: Diffuse carcinomatosis of the residual omentum involving the entire transverse colon. Transverse colon densely adherent to the anterior abdominal wall. No disease throughout the small bowel. Mesenteric adenopathy with lymph nodes measuring 1 cm, involving the root of the small bowel mesentery. ~5 centimeter right ovarian mass and ~ 6 cm left ovarian mass. Small bowel densely  adherent to the pelvis. 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 She resumed chemotherapy with dose dense taxol carboplatin on 09-27-12, completing cycle 5 on 11-08-12. CA 125 increased from 92 in April to 130 post op in June, then 225 late July and repeat 318. CT CAP done 11-19-12 showed increase in nodular thickening along falciform ligament and small peritoneal nodule right iliac fossa. Dr Duard Brady recommended change in chemotherapy to doxil with avastin, however echocardiogram 11-27-12 unexpectedly had EF of 15%. 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 She was hospitalized 8-13 thru 12-11-12, had some bleeding into the embolic CVA on 12-05-12, and treated with heparin transitioned to coumadin. She was stable to resume gemzar with cycle 2 at 800 mg/m2 given on 12-30-12,then platelets down to 70k by day 8. She had progressive superficial thrombophlebitis LE on coumadin by 12-25-12 and was changed to full dose lovenox then; there has been difficulty getting lovenox covered by insurance. Cycle 3 gemzar 01-13-13 was dose reduced to 600 mg/m2, however platelets still dropped to 90k; she had recurrent embolic CVAs on 1-91-47 and was hospitalized thru 01-28-13, treated with continuous heparin transitioned back to coumadin, plus baby ASA daily. Gemzar cycles 4-6 has been at 500 mg/m2, with only minimal thrombocytopenia.   Review of systems as above, also: Feels reasonably well other than the superficial thrombophlebitis. No nausea/ vomiting. No abdominal or pelvic pain. No respiratory symptoms. No rash. No problems with PICC. Remainder of 10 point Review of Systems negative.  Objective:  Vital signs in last 24 hours:  BP 122/69  Pulse 81  Temp(Src) 98.9 F (37.2 C) (Oral)  Resp 20  Ht 5\' 7"  (1.702 m)  Wt 143 lb 6.4 oz (65.046 kg)  BMI 22.45 kg/m2  Alert, oriented and appropriate. Ambulatory  without  difficulty.  Alopecia  HEENT:PERRL, sclerae not icteric. Oral mucosa moist without lesions, posterior pharynx clear.  Neck supple. No JVD.  Lymphatics:no cervical,suraclavicular, axillary or inguinal adenopathy Resp: clear to auscultation bilaterally and normal percussion bilaterally Cardio: regular rate and rhythm. No gallop. GI: soft, nontender, not distended, no mass or organomegaly. Normally active bowel sounds. Surgical incision not remarkable. Musculoskeletal/ Extremities: without pitting edema, cords, tenderness. Knee hi TEDS on, few superficial firm varicosities calves L>R Neuro: no peripheral neuropathy. Otherwise nonfocal Skin:erythema upper inner right thigh extending to back of leg, extent marked with pen. Slightly warm. Cord medially in this area slightly tender.  PICC dressing intact, no swelling, tenderness or surrounding erythema  Lab Results:  Results for orders placed in visit on 03/05/13  CBC WITH DIFFERENTIAL      Result Value Range   WBC 4.6  3.9 - 10.3 10e3/uL   NEUT# 3.8  1.5 - 6.5 10e3/uL   HGB 9.3 (*) 11.6 - 15.9 g/dL   HCT 82.9 (*) 56.2 - 13.0 %   Platelets 245  145 - 400 10e3/uL   MCV 89.4  79.5 - 101.0 fL   MCH 29.1  25.1 - 34.0 pg   MCHC 32.5  31.5 - 36.0 g/dL   RBC 8.65 (*) 7.84 - 6.96 10e6/uL   RDW 19.6 (*) 11.2 - 14.5 %  lymph# 0.5 (*) 0.9 - 3.3 10e3/uL   MONO# 0.2  0.1 - 0.9 10e3/uL   Eosinophils Absolute 0.0  0.0 - 0.5 10e3/uL   Basophils Absolute 0.0  0.0 - 0.1 10e3/uL   NEUT% 83.1 (*) 38.4 - 76.8 %   LYMPH% 11.2 (*) 14.0 - 49.7 %   MONO% 4.8  0.0 - 14.0 %   EOS% 0.7  0.0 - 7.0 %   BASO% 0.2  0.0 - 2.0 %  PROTIME-INR      Result Value Range   Protime 37.2 (*) 10.6 - 13.4 Seconds   INR 3.10  2.00 - 3.50   Lovenox No       Studies/Results:  No results found.  Medications: I have reviewed the patient's current medications. She will continue coumadin at previous dose of 5 mg daily since she continues Keflex Per my information,  prophylactic dose of SQ heparin is 5000 units q 8-12 hr and Rx of DVT is 333u/kg load then 250 u/kg q 12.  DISCUSSION: we have discussed IV/ SQ heparin if situation worsens, however as area seems slightly improved will continue coumadin with ASA, warm soaks, Keflex for now. She will call this office in AMs next 2 days to let us know how she is, will go to ED if fever or more symptomatic, and will see me again early next week.  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 IVC filter in. Underlying etiology for hypercoagulable state likely the advanced gyn cancer. Very difficult situation. SQ heparin would be next choice if needed, but actually doing well on coumadin + ASA at present. We are trying to avoid low platelets from chemo. [facility] coumadin clinic assistance appreciated. Follow up neurology. 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 Follow up with Dr Katrinka Blazing 03-11-13.  4.recurrent superficial LE thrombophlebitis: continue coumadin as above, Keflex, ASA.    5.Advance Directives and HCPOA completed. With advanced, resistant cancer, multiple CVAs and cardiac disease, I doubt life support would be beneficial overall.  6.power PICC in: protocol is to flush with heparin twice weekly, done at this office 7. flu vaccine done  8.long tobacco, discontinued 9.episode of abdominal pain and increase in LFTs: see plan above. No symptoms presently.  10. IVC filter in   I  will see her 11-17 with CBC and INR   Monique [name] P, MD   03/05/2013, 4:30 PM

## 2013-03-06 ENCOUNTER — Telehealth: Payer: Self-pay

## 2013-03-06 NOTE — Telephone Encounter (Signed)
Monique [name] called this am stating that the redness has decreased in the  right thigh area.  The cord is softening. Afebrile.   No diarrhea today.  Last night she had 1 episode.  Suggested the "BRAT" diet to her and reviewed the foods associated with this. She is keeping up with the fluids.   She will call triage in the am as directed by Dr. Darrold Span with an update. Today's update given to Dr. Darrold Span.

## 2013-03-07 ENCOUNTER — Telehealth: Payer: Self-pay | Admitting: *Deleted

## 2013-03-07 NOTE — Telephone Encounter (Signed)
Patient called this am and reported that her phlebitis in right leg is a little less red.  Overall, she thinks it is slowly improving.  She remains afebrile and reports that she has not had any diarrhea yesterday or today.  Patient is aware of appt. Here on Monday 03/10/13.  Discussed with her to call anytime this weekend if things change, otherwise will see her Monday.  She verbalized understanding of this plan. Paged Dr.Livesay and reported conversation.  She is fine with this and will see the patient Monday  03/10/13.

## 2013-03-09 ENCOUNTER — Other Ambulatory Visit: Payer: Self-pay | Admitting: Oncology

## 2013-03-09 ENCOUNTER — Encounter: Payer: Self-pay | Admitting: Interventional Cardiology

## 2013-03-10 ENCOUNTER — Other Ambulatory Visit: Payer: Self-pay

## 2013-03-10 ENCOUNTER — Telehealth: Payer: Self-pay | Admitting: *Deleted

## 2013-03-10 ENCOUNTER — Encounter: Payer: Self-pay | Admitting: Oncology

## 2013-03-10 ENCOUNTER — Ambulatory Visit (HOSPITAL_BASED_OUTPATIENT_CLINIC_OR_DEPARTMENT_OTHER): Payer: Medicare Other | Admitting: Oncology

## 2013-03-10 ENCOUNTER — Ambulatory Visit (HOSPITAL_BASED_OUTPATIENT_CLINIC_OR_DEPARTMENT_OTHER): Payer: Medicare Other

## 2013-03-10 ENCOUNTER — Other Ambulatory Visit (HOSPITAL_BASED_OUTPATIENT_CLINIC_OR_DEPARTMENT_OTHER): Payer: Medicare Other | Admitting: Lab

## 2013-03-10 ENCOUNTER — Ambulatory Visit: Payer: Medicare Other | Admitting: Pharmacist

## 2013-03-10 VITALS — BP 123/58 | HR 64 | Temp 98.9°F | Resp 17 | Ht 67.0 in | Wt 141.8 lb

## 2013-03-10 DIAGNOSIS — I63432 Cerebral infarction due to embolism of left posterior cerebral artery: Secondary | ICD-10-CM

## 2013-03-10 DIAGNOSIS — Z86718 Personal history of other venous thrombosis and embolism: Secondary | ICD-10-CM

## 2013-03-10 DIAGNOSIS — I639 Cerebral infarction, unspecified: Secondary | ICD-10-CM

## 2013-03-10 DIAGNOSIS — Z452 Encounter for adjustment and management of vascular access device: Secondary | ICD-10-CM

## 2013-03-10 DIAGNOSIS — C569 Malignant neoplasm of unspecified ovary: Secondary | ICD-10-CM

## 2013-03-10 DIAGNOSIS — I8003 Phlebitis and thrombophlebitis of superficial vessels of lower extremities, bilateral: Secondary | ICD-10-CM

## 2013-03-10 DIAGNOSIS — I8000 Phlebitis and thrombophlebitis of superficial vessels of unspecified lower extremity: Secondary | ICD-10-CM

## 2013-03-10 DIAGNOSIS — D6859 Other primary thrombophilia: Secondary | ICD-10-CM

## 2013-03-10 LAB — CBC WITH DIFFERENTIAL/PLATELET
Basophils Absolute: 0 10*3/uL (ref 0.0–0.1)
Eosinophils Absolute: 0 10*3/uL (ref 0.0–0.5)
HGB: 9.9 g/dL — ABNORMAL LOW (ref 11.6–15.9)
MCV: 89.6 fL (ref 79.5–101.0)
MONO#: 0.5 10*3/uL (ref 0.1–0.9)
MONO%: 17.2 % — ABNORMAL HIGH (ref 0.0–14.0)
NEUT#: 1.3 10*3/uL — ABNORMAL LOW (ref 1.5–6.5)
Platelets: 167 10*3/uL (ref 145–400)
RDW: 19.4 % — ABNORMAL HIGH (ref 11.2–14.5)
WBC: 3 10*3/uL — ABNORMAL LOW (ref 3.9–10.3)

## 2013-03-10 LAB — PROTIME-INR: Protime: 30 Seconds — ABNORMAL HIGH (ref 10.6–13.4)

## 2013-03-10 LAB — POCT INR
INR: 2.25
INR: 2.5

## 2013-03-10 MED ORDER — SODIUM CHLORIDE 0.9 % IJ SOLN
10.0000 mL | INTRAMUSCULAR | Status: DC | PRN
  Administered 2013-03-10: 10 mL via INTRAVENOUS
  Filled 2013-03-10: qty 10

## 2013-03-10 MED ORDER — HEPARIN SOD (PORK) LOCK FLUSH 100 UNIT/ML IV SOLN
500.0000 [IU] | Freq: Once | INTRAVENOUS | Status: AC
  Administered 2013-03-10: 500 [IU] via INTRAVENOUS
  Filled 2013-03-10: qty 5

## 2013-03-10 MED ORDER — CEPHALEXIN 500 MG PO CAPS: 500.0000 mg | ORAL_CAPSULE | Freq: Three times a day (TID) | ORAL | Status: DC

## 2013-03-10 NOTE — Telephone Encounter (Signed)
Per staff message and POF I have scheduled appts.  JMW  

## 2013-03-10 NOTE — Telephone Encounter (Signed)
appts made and printed. Pt is aware that tx will be added. i emailed MW to add the tx...td 

## 2013-03-10 NOTE — Patient Instructions (Signed)
INR at goal No changes Continue Coumadin at 5 mg daily.   Recheck INR on 03/17/13 with other scheduled appointments we will see you in infusion. Lab at 10:15am

## 2013-03-10 NOTE — Progress Notes (Signed)
INR at goal today Pt is doing well today. She will also be seen by Dr. Darrold Span. Pt reports phlebitis is improving but still is present on her right leg. Pt has had Keflex x 7 days (she has 3 capsules remaining) unless pt is continued on keflex with the phlebitis still lingering Pt reports no issues regarding her coumadin: No bleeding/bruising No missed/extra doses No medication or diet changes Pt feels more comfortable keeping her appointment next week vs. Rescheduling for 2 weeks from now.  Plan: Continue Coumadin at 5 mg daily.   Recheck INR on 03/17/13 with other scheduled appointments. Lab at 10:15am and coumadin clinic at 10:30am. If treatment is added on we will see patient in treatment area.

## 2013-03-10 NOTE — Progress Notes (Signed)
OFFICE PROGRESS NOTE   03/10/2013   Physicians:P.Lazaro Arms, MD (PCP [facility])/ [place] Elpidio Anis, Leanord Hawking   INTERVAL HISTORY:  Patient is seen, alone for visit, in follow up of recurrent superficial thrombophlebitis RLE in setting of advanced bilateral ovarian cancer. Local symptoms have improved with keflex in addition to therapeutic coumadin and ongoing ASA over past several days. She has had no fever, but still an area just above right knee medially very red and tender. She is otherwise feeling fairly well, without abdominal or pelvic pain, appetite ok without nausea or vomiting, bowels ok, no bleeding, no increased SOB, no new or different neurologic symptoms.   ONCOLOGIC HISTORY Patient developed constipation with some right abdominal pain in May 2013. She saw GI physician in ~ Aug 2013, but was unable to tolerate prep for colonoscopy such that this was never accomplished. She developed LLE superficial phlebitis in Jan 2014 (dopplers by PCP negative for DVT then), followed by swelling and discomfort LLE with venous doppler at [facility] in [facility] on 05-21-12 with acute thrombus left common femoral vein. 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 She was begun on lovenox and transitioned to Xarelto by Dr Everlene Other. CA 125 was 569  initially. She saw Dr Duard Brady on 06-12-12, exam remarkable for distended abdomen with fluid wave and 15 cm fixed mass in pelvis. 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 Neoadjuvant dose dense carbo/taxol was beun 06-21-12 with 3 cycles given thru 08-16-12. She needed neupogen days 2,8 and 16 on this regimen. She had good partial response by CTs 08-19-12 and had improvement in CA 125 from 1041 as baseline for the chemotherapy in late Feb 2014 down to 92 on 08-21-12 after 3 cycles. She had IVC filter placed prior to gyn oncology surgery. She went to exploratory laparotomy with extensive lysis of adhesions, left ureteral lysis, and bilateral salpingo-oophorectomy by Dr Duard Brady on 09-10-12, suboptimal debulking. Operative findings: Diffuse carcinomatosis of the residual omentum involving the entire transverse colon. Transverse colon densely adherent to the anterior abdominal wall. No disease throughout the small bowel. Mesenteric adenopathy with lymph nodes measuring 1 cm, involving the root of the small bowel mesentery. ~5 centimeter right ovarian mass and ~ 6 cm left ovarian mass. Small bowel densely adherent to the pelvis. 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 She resumed chemotherapy with dose dense taxol carboplatin on 09-27-12,  completing cycle 5 on 11-08-12. CA 125 increased from 92 in April to 130 post op in June, then 225 late July and repeat 318. CT CAP done 11-19-12 showed increase in nodular thickening  along falciform ligament and small peritoneal nodule right iliac fossa. Dr Duard Brady recommended change in chemotherapy to doxil with avastin, however echocardiogram 11-27-12 unexpectedly had EF of 15%. She received one cycle of gemzar on 12-02-12, given peripherally, then had embolic CVAs on 1-61-09 while still on xarelto. She was hospitalized 8-13 thru 12-11-12, had some bleeding into the embolic CVA on 12-05-12, and treated with heparin transitioned to coumadin. She was stable to resume gemzar with cycle 2 at 800 mg/m2 given on 12-30-12,then platelets down to 70k by day 8. She had progressive superficial thrombophlebitis LE on coumadin by 12-25-12 and was changed to full dose lovenox then; there has been difficulty getting lovenox covered by insurance. Cycle 3 gemzar 01-13-13 was dose reduced to 600 mg/m2, however platelets still dropped to 90k; she had recurrent embolic CVAs on 09-25-52 and was hospitalized thru 01-28-13, treated with continuous heparin transitioned back to coumadin, plus baby ASA daily. Gemzar cycles 4-6 have been at 500 mg/m2, without significant thrombocytopenia.   Review of systems as above, also:  No bladder symptoms. No swelling lower legs. No orthopnea or DOE, tho she is not very active. No problems with PICC. Remainder of 10 point Review of Systems negative.  Objective:  Vital signs in last 24 hours:  BP 123/58  Pulse 64  Temp(Src) 98.9 F (37.2 C) (Oral)  Resp 17  Ht 5\' 7"  (1.702 m)  Wt 141 lb 12.8 oz (64.32 kg)  BMI 22.20 kg/m2  SpO2 100%  Alert, oriented and appropriate. Ambulatory without assistance.  Alopecia  HEENT:PERRL, sclerae not icteric. Oral mucosa moist without lesions, posterior pharynx clear.  Neck supple. No JVD.  Lymphatics:no cervical,suraclavicular, axillary or inguinal adenopathy Resp: clear to auscultation bilaterally and normal percussion bilaterally Cardio: regular rate and rhythm. No gallop. GI: soft, nontender, not distended, no mass or  organomegaly. Normally active bowel sounds. Surgical incision not remarkable. Musculoskeletal/ Extremities: right thigh with only residual hyperpigmentation upper medial area where previously erythema, with nontender ~ 3 cm cord upper anterior thigh. Just above right knee is marked erythema with tender cord 3-4 cm length. Calves with small residual firmer areas in superficial varicosities, none tender, knee hi teds on. Neuro: Speech almost entirely fluent. No new deficits CN, motor, sensory, cerebellar Skin without rash, ecchymosis, petechiae PICC right upper arm site ok.  Lab Results:  Results for orders placed in visit on 03/10/13  POCT INR      Result Value Range   INR 2.5    POCT INR      Result Value Range   INR 2.25     Coumadin to continue at 5 mg daily.  Will repeat CA 125 early Dec  Studies/Results:  No results found.  Medications: I have reviewed the patient's current medications. Agree with coumadin clinic pharmacist no change in present coumadin dose. Will continue Keflex another week due to continuing improvement in thrombophlebitis RLE.  DISCUSSION: patient is in agreement with continuing present interventions including warm soaks to RLE and continuing Keflex, which she is now tolerating without difficulty.   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 Continue gemzar q 2 weeks, next 03-17-13. I will  see her at least 12-8 or sooner if needed. Chemo orders done now. 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 IVC filter in.  Underlying etiology for hypercoagulable state likely the advanced gyn cancer. Very difficult situation. SQ heparin would be next choice if needed, but actually doing well on coumadin + ASA at present. We are trying to avoid low platelets from chemo. [facility] coumadin clinic assistance appreciated. Known to neurology 3.cardiomyopathy found unexpectedly on echocardiogram done as baseline for doxil, EF 15-20%. Follow up with Dr Katrinka Blazing 03-11-13.  4.recurrent superficial LE thrombophlebitis: continue coumadin as above, Keflex, ASA.  5.Advance Directives and HCPOA completed. With advanced, resistant cancer, multiple CVAs and cardiac disease, I doubt life support would be beneficial overall.  6.power PICC in: protocol is to flush with heparin twice weekly and dressing changes weekly, done at this office 7. flu vaccine done  8.long tobacco, discontinued  9 IVC filter in    Patient is in agreement with plan    Timothee Gali P, MD   03/10/2013, 3:45 PM

## 2013-03-11 ENCOUNTER — Encounter: Payer: Self-pay | Admitting: Interventional Cardiology

## 2013-03-11 ENCOUNTER — Ambulatory Visit (INDEPENDENT_AMBULATORY_CARE_PROVIDER_SITE_OTHER): Payer: Medicare Other | Admitting: Interventional Cardiology

## 2013-03-11 VITALS — BP 120/62 | HR 58 | Ht 67.0 in | Wt 143.0 lb

## 2013-03-11 DIAGNOSIS — I5022 Chronic systolic (congestive) heart failure: Secondary | ICD-10-CM

## 2013-03-11 DIAGNOSIS — D6859 Other primary thrombophilia: Secondary | ICD-10-CM

## 2013-03-11 DIAGNOSIS — I429 Cardiomyopathy, unspecified: Secondary | ICD-10-CM

## 2013-03-11 DIAGNOSIS — I472 Ventricular tachycardia: Secondary | ICD-10-CM

## 2013-03-11 DIAGNOSIS — I427 Cardiomyopathy due to drug and external agent: Secondary | ICD-10-CM

## 2013-03-11 NOTE — Progress Notes (Signed)
Patient ID: Monique [name], female   DOB: [birth year], 67 y.o.   MRN: [id number]    [address] [place], [place]  [address] Phone: [phone] Fax:  [phone]  Date:  03/11/2013   ID:  Monique, [name] Aug 05, 1945, MRN [id number]  PCP:  Aura Dials, MD   ASSESSMENT:  1. 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 Paroxysmal ventricular and atrial tachycardia, stable on current medical regimen 3. Chronic anticoagulation therapy due to hypercoagulability  PLAN:  1. Continue current medical regimen 2. Call if dyspnea or ankle edema   SUBJECTIVE: Monique [name] is a 66 y.o. female who is finding metastatic ovarian cancer. She is in good spirits. She denies dyspnea and palpitations. She has not had syncope. There've been no recurrences of neurological events due to hypercoagulability. She is now on Coumadin. She denies orthostatic dizziness. She denies medication side effects   Wt Readings from Last 3 Encounters:  03/11/13 143 lb (64.864 kg)  03/10/13 141 lb 12.8 oz (64.32 kg)  03/05/13 143 lb 6.4 oz (65.046 kg)     Past Medical History  Diagnosis Date  . Acute venous embolism and thrombosis of unspecified deep vessels of lower extremity   . Bronchitis     several times  . Abdominal or pelvic swelling, mass or lump, unspecified site   . Phlebitis and thrombophlebitis of unspecified site   . Pelvic mass   . GERD (gastroesophageal reflux disease)   . PONV (postoperative nausea and vomiting)     "when fentanyl is used, will break out in itchy rast"  . Basal cell carcinoma of face   . Ovarian cancer   . CHF (congestive heart failure)     "just since 11/2012; they think it's from the chemo" (01/21/2013)  . 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 DVT (deep venous thrombosis) 04/2012    "BLE" (01/21/2013)  . Chronic bronchitis     "I had it several times; I used to smoke" (01/21/2013)    . Shortness of breath     "trouble taking a deep breath at times before RX started; I'm fine now" (01/21/2013)  . Anemia     "because of the chemo" (01/21/2013)  . Arthritis     "little in my right hand" (01/21/2013)  . Anxiety     "related to ovarian cancer; don't take RX for it" (01/21/2013)    Current Outpatient Prescriptions  Medication Sig Dispense Refill  . aspirin 81 MG tablet Take 1 tablet (81 mg total) by mouth daily.  30 tablet    . cephALEXin (KEFLEX) 500 MG capsule Take 1 capsule (500 mg total) by mouth 3 (three) times daily.  21 capsule  0  . lisinopril (PRINIVIL,ZESTRIL) 2.5 MG tablet Take 1 tablet (2.5 mg total) by mouth daily.  30 tablet  5  . metoprolol succinate (TOPROL-XL) 25 MG 24 hr tablet Take 12.5 mg by mouth 2 (two) times daily.      . pantoprazole (PROTONIX) 40 MG tablet Take 1 tablet (40 mg total) by mouth daily.  30 tablet  1  . polyethylene glycol (MIRALAX / GLYCOLAX) packet Take 17 g by mouth daily as needed (for constipation).       . sennosides-docusate sodium (SENOKOT-S) 8.6-50 MG tablet Take 1-2 tablets by mouth daily as needed for constipation.      Marland Kitchen spironolactone (ALDACTONE) 25 MG tablet Take 1 tablet (  25 mg total) by mouth daily.  30 tablet  5  . warfarin (COUMADIN) 5 MG tablet Take 5 mg by mouth daily.       No current facility-administered medications for this visit.   Facility-Administered Medications Ordered in Other Visits  Medication Dose Route Frequency Provider Last Rate Last Dose  . sodium chloride 0.9 % injection 10 mL  10 mL Intracatheter PRN Lennis Buzzy Han, MD   10 mL at 02/17/13 1158    Allergies:    Allergies  Allergen Reactions  . Septra [Sulfamethoxazole-Tmp Ds] Rash and Other (See Comments)    High fever, Burning skin  . Ampicillin Itching  . Fentanyl And Related Hives    Per Dr. Acey Lav, pt reports she is ok with fentanyl (09/10/12). Patient states it was epidural form of Fentanyl that she reacted to.   . Neosporin  [Neomycin-Bacitracin Zn-Polymyx] Swelling and Other (See Comments)    opthalmic solution only can use topically. Swelling and inflamed eyes    Social History:  The patient  reports that she quit smoking about 10 months ago. Her smoking use included Cigarettes. She smoked 0.00 packs per day for 30 years. She has never used smokeless tobacco. She reports that she drinks alcohol. She reports that she does not use illicit drugs.   ROS:  Please see the history of present illness.   Stable appetite. No orthopnea, PND, edema, chest pain, or other complaint   All other systems reviewed and negative.   OBJECTIVE: VS:  BP 120/62  Pulse 58  Ht 5\' 7"  (1.702 m)  Wt 143 lb (64.864 kg)  BMI 22.39 kg/m2 Well nourished, well developed, in no acute distress, well-nourished HEENT: normal Neck: JVD flat. Carotid bruit absent  Cardiac:  normal S1, S2; RRR; no murmur Lungs:  clear to auscultation bilaterally, no wheezing, rhonchi or rales Abd: soft, nontender, no hepatomegaly Ext: Edema absent. Pulses 2+. She does have according along the medial aspect of the right thigh consistent with superficial phlebitis. Skin: warm and dry Neuro:  CNs 2-12 intact, no focal abnormalities noted  EKG:  Not repeated       Signed, Darci Needle III, MD 03/11/2013 11:26 AM

## 2013-03-11 NOTE — Patient Instructions (Signed)
Your physician recommends that you continue on your current medications as directed. Please refer to the Current Medication list given to you today.  Please call the office if you are experiencing the following symptoms: Shortness of breath, Edema(swelling), Palpitations  [phone]  Your physician wants you to follow-up in: 6 months You will receive a reminder letter in the mail two months in advance. If you don't receive a letter, please call our office to schedule the follow-up appointment.

## 2013-03-13 ENCOUNTER — Ambulatory Visit (HOSPITAL_BASED_OUTPATIENT_CLINIC_OR_DEPARTMENT_OTHER): Payer: Medicare Other

## 2013-03-13 VITALS — BP 109/52 | HR 63 | Temp 98.2°F

## 2013-03-13 DIAGNOSIS — Z452 Encounter for adjustment and management of vascular access device: Secondary | ICD-10-CM

## 2013-03-13 DIAGNOSIS — C569 Malignant neoplasm of unspecified ovary: Secondary | ICD-10-CM

## 2013-03-13 MED ORDER — HEPARIN SOD (PORK) LOCK FLUSH 100 UNIT/ML IV SOLN
250.0000 [IU] | Freq: Once | INTRAVENOUS | Status: AC
  Administered 2013-03-13: 250 [IU] via INTRAVENOUS
  Filled 2013-03-13: qty 5

## 2013-03-13 MED ORDER — SODIUM CHLORIDE 0.9 % IJ SOLN
10.0000 mL | INTRAMUSCULAR | Status: DC | PRN
  Administered 2013-03-13: 10 mL via INTRAVENOUS
  Filled 2013-03-13: qty 10

## 2013-03-13 NOTE — Patient Instructions (Signed)
Peripherally Inserted Central Catheter (PICC) Home Guide A peripherally inserted central catheter (PICC) is a long, thin, flexible tube that is inserted into a vein in the upper arm. It is a form of intravenous (IV) access. It is considered to be a "central" line because the tip of the PICC ends in a large vein in your chest. This large vein is called the superior vena cava (SVC). The PICC tip ends in the SVC because there is a lot of blood flow in the SVC. This allows medicines and IV fluids to be quickly distributed throughout the body. The PICC is inserted using a sterile technique by a specially trained nurse or physician. After the PICC is inserted, a chest X-ray is done to be sure it is in the correct place.  A PICC may be placed for different reasons, such as:  To give medicines and liquid nutrition that can only be given through a central line. Examples are:  Certain antibiotic treatments.  Chemotherapy.  Total parenteral nutrition (TPN).  To take frequent blood samples.  To give IV fluids and blood products.  If there is difficulty placing a peripheral intravenous (PIV) catheter. If taken care of properly, a PICC can remain in place for several months. A PICC can also allow patients to go home early. Medicine and PICC care can be managed at home by a family member or home healthcare team. RISKS AND COMPLICATIONS Possible problems with a PICC can occasionally occur. This may include:  A clot (thrombus) forming in or at the tip of the PICC. This can cause the PICC to become clogged. A "clot-busting" medicine called tissue plasminogen activator (tPA) can be inserted into the PICC to help break up the clot.  Inflammation of the vein (phlebitis) in which the PICC is placed. Signs of inflammation may include redness, pain at the insertion site, red streaks, or being able to feel a "cord" in the vein where the PICC is located.  Infection in the PICC or at the insertion site. Signs of  infection may include fever, chills, redness, swelling, or pus drainage from the PICC insertion site.  PICC movement (malposition). The PICC tip may migrate from its original position due to excessive physical activity, forceful coughing, sneezing, or vomiting.  A break or cut in the PICC. It is important to not use scissors near the PICC.  Nerve or tendon irritation or injury during PICC insertion. HOME CARE INSTRUCTIONS Activity  You may bend your arm and move it freely. If your PICC is near or at the bend of your elbow, avoid activity with repeated motion at the elbow.  Avoid lifting heavy objects as instructed by your caregiver.  Avoid using a crutch with the arm on the same side as your PICC. You may need to use a walker. PICC Dressing  Keep your PICC bandage (dressing) clean and dry to prevent infection.  Ask your caregiver when you may shower. Ask your caregiver to teach you how to wrap the PICC when you do take a shower.  Do not bathe, swim, or use hot tubs when you have a PICC.  Change the PICC dressing as instructed by your caregiver.  Change your PICC dressing if it becomes loose or wet. General PICC Care  Check the PICC insertion site daily for leakage, redness, swelling, or pain.  Flush the PICC as directed by your caregiver. Let your caregiver know right away if the PICC is difficult to flush or does not flush. Do not use force   to flush the PICC.  Do not use a syringe that is less than 10 mLs to flush the PICC.  Never pull or tug on the PICC.  Avoid blood pressure checks on the arm with the PICC.  Keep your PICC identification card with you at all times.  Do not take the PICC out yourself. Only a trained clinical professional should remove the PICC. SEEK IMMEDIATE MEDICAL CARE IF:  Your PICC is accidently pulled all the way out. If this happens, cover the insertion site with a bandage or gauze dressing. Do not throw the PICC away. Your caregiver will need to  inspect it.  Your PICC was tugged or pulled and has partially come out. Do not  push the PICC back in.  There is any type of drainage, redness, or swelling where the PICC enters the skin.  You cannot flush the PICC, it is difficult to flush, or the PICC leaks around the insertion site when it is flushed.  You hear a "flushing" sound when the PICC is flushed.  You have pain, discomfort, or numbness in your arm, shoulder, or jaw on the same side as the PICC .  You feel your heart "racing" or skipping beats.  You notice a hole or tear in the PICC.  You develop chills or a fever. MAKE SURE YOU:   Understand these instructions.  Will watch your condition.  Will get help right away if you are not doing well or get worse. Document Released: 10/15/2002 Document Revised: 07/03/2011 Document Reviewed: 08/15/2010 ExitCare Patient Information 2014 [employer].  

## 2013-03-17 ENCOUNTER — Ambulatory Visit (HOSPITAL_BASED_OUTPATIENT_CLINIC_OR_DEPARTMENT_OTHER): Payer: Medicare Other | Admitting: Pharmacist

## 2013-03-17 ENCOUNTER — Other Ambulatory Visit (HOSPITAL_BASED_OUTPATIENT_CLINIC_OR_DEPARTMENT_OTHER): Payer: Medicare Other

## 2013-03-17 ENCOUNTER — Ambulatory Visit (HOSPITAL_BASED_OUTPATIENT_CLINIC_OR_DEPARTMENT_OTHER): Payer: Medicare Other

## 2013-03-17 ENCOUNTER — Ambulatory Visit: Payer: Medicare Other

## 2013-03-17 ENCOUNTER — Other Ambulatory Visit: Payer: Self-pay | Admitting: Medical Oncology

## 2013-03-17 VITALS — BP 116/58 | HR 58 | Temp 98.4°F | Resp 18

## 2013-03-17 DIAGNOSIS — C569 Malignant neoplasm of unspecified ovary: Secondary | ICD-10-CM

## 2013-03-17 DIAGNOSIS — Z86718 Personal history of other venous thrombosis and embolism: Secondary | ICD-10-CM

## 2013-03-17 DIAGNOSIS — I8000 Phlebitis and thrombophlebitis of superficial vessels of unspecified lower extremity: Secondary | ICD-10-CM

## 2013-03-17 DIAGNOSIS — I639 Cerebral infarction, unspecified: Secondary | ICD-10-CM

## 2013-03-17 DIAGNOSIS — Z5111 Encounter for antineoplastic chemotherapy: Secondary | ICD-10-CM

## 2013-03-17 LAB — CBC WITH DIFFERENTIAL/PLATELET
BASO%: 0.4 % (ref 0.0–2.0)
Basophils Absolute: 0 10*3/uL (ref 0.0–0.1)
Eosinophils Absolute: 0.1 10*3/uL (ref 0.0–0.5)
HCT: 32.5 % — ABNORMAL LOW (ref 34.8–46.6)
HGB: 10.5 g/dL — ABNORMAL LOW (ref 11.6–15.9)
LYMPH%: 18 % (ref 14.0–49.7)
MCH: 29.5 pg (ref 25.1–34.0)
MCHC: 32.3 g/dL (ref 31.5–36.0)
MCV: 91.3 fL (ref 79.5–101.0)
MONO#: 0.7 10*3/uL (ref 0.1–0.9)
NEUT#: 3.8 10*3/uL (ref 1.5–6.5)
Platelets: 254 10*3/uL (ref 145–400)
RBC: 3.56 10*6/uL — ABNORMAL LOW (ref 3.70–5.45)
RDW: 20 % — ABNORMAL HIGH (ref 11.2–14.5)
WBC: 5.5 10*3/uL (ref 3.9–10.3)

## 2013-03-17 LAB — PROTIME-INR: Protime: 30 Seconds — ABNORMAL HIGH (ref 10.6–13.4)

## 2013-03-17 MED ORDER — SODIUM CHLORIDE 0.9 % IV SOLN
Freq: Once | INTRAVENOUS | Status: AC
  Administered 2013-03-17: 12:00:00 via INTRAVENOUS

## 2013-03-17 MED ORDER — DEXAMETHASONE SODIUM PHOSPHATE 10 MG/ML IJ SOLN
4.0000 mg | Freq: Once | INTRAMUSCULAR | Status: AC
  Administered 2013-03-17: 4 mg via INTRAVENOUS

## 2013-03-17 MED ORDER — HEPARIN SOD (PORK) LOCK FLUSH 100 UNIT/ML IV SOLN
250.0000 [IU] | Freq: Once | INTRAVENOUS | Status: AC | PRN
  Administered 2013-03-17: 250 [IU]
  Filled 2013-03-17: qty 5

## 2013-03-17 MED ORDER — ACETAMINOPHEN 325 MG PO TABS
650.0000 mg | ORAL_TABLET | Freq: Once | ORAL | Status: AC
  Administered 2013-03-17: 650 mg via ORAL

## 2013-03-17 MED ORDER — ONDANSETRON 8 MG/50ML IVPB (CHCC)
8.0000 mg | Freq: Once | INTRAVENOUS | Status: AC
  Administered 2013-03-17: 8 mg via INTRAVENOUS

## 2013-03-17 MED ORDER — ACETAMINOPHEN 325 MG PO TABS
ORAL_TABLET | ORAL | Status: AC
  Filled 2013-03-17: qty 2

## 2013-03-17 MED ORDER — DEXAMETHASONE SODIUM PHOSPHATE 10 MG/ML IJ SOLN
INTRAMUSCULAR | Status: AC
  Filled 2013-03-17: qty 1

## 2013-03-17 MED ORDER — SODIUM CHLORIDE 0.9 % IV SOLN
500.0000 mg/m2 | Freq: Once | INTRAVENOUS | Status: AC
  Administered 2013-03-17: 912 mg via INTRAVENOUS
  Filled 2013-03-17: qty 24

## 2013-03-17 MED ORDER — SODIUM CHLORIDE 0.9 % IJ SOLN
10.0000 mL | INTRAMUSCULAR | Status: DC | PRN
  Administered 2013-03-17: 10 mL
  Filled 2013-03-17: qty 10

## 2013-03-17 MED ORDER — ONDANSETRON 8 MG/NS 50 ML IVPB
INTRAVENOUS | Status: AC
  Filled 2013-03-17: qty 8

## 2013-03-17 NOTE — Progress Notes (Signed)
Treatment today - dressing and cap change to be done in treatment room.

## 2013-03-17 NOTE — Progress Notes (Signed)
INR remains at goal of 2-3 on 5mg  daily.  Monique [name] will complete 10day cycle of cephalexin tomorrow.  No bleeding or bruising.  Will check PT/INR in 2 weeks since Monique [name] INR has been at goal for >1 month on current coumadin dose.

## 2013-03-17 NOTE — Patient Instructions (Signed)
[facility] Discharge Instructions for Patients Receiving Chemotherapy  Today you received the following chemotherapy agent Gemzar.  To help prevent nausea and vomiting after your treatment, we encourage you to take your nausea medication.   If you develop nausea and vomiting that is not controlled by your nausea medication, call the clinic.   BELOW ARE SYMPTOMS THAT SHOULD BE REPORTED IMMEDIATELY:  *FEVER GREATER THAN 100.5 F  *CHILLS WITH OR WITHOUT FEVER  NAUSEA AND VOMITING THAT IS NOT CONTROLLED WITH YOUR NAUSEA MEDICATION  *UNUSUAL SHORTNESS OF BREATH  *UNUSUAL BRUISING OR BLEEDING  TENDERNESS IN MOUTH AND THROAT WITH OR WITHOUT PRESENCE OF ULCERS  *URINARY PROBLEMS  *BOWEL PROBLEMS  UNUSUAL RASH Items with * indicate a potential emergency and should be followed up as soon as possible.  Feel free to call the clinic you have any questions or concerns. The clinic phone number is [phone].    

## 2013-03-21 ENCOUNTER — Other Ambulatory Visit: Payer: Self-pay | Admitting: *Deleted

## 2013-03-21 ENCOUNTER — Telehealth: Payer: Self-pay | Admitting: Interventional Cardiology

## 2013-03-21 NOTE — Telephone Encounter (Signed)
New problem     Pt calling to report she is having increased Palpitations.  Pt instructions asked her to do this.

## 2013-03-21 NOTE — Telephone Encounter (Signed)
I spoke to Dr. Patty Sermons and he gave orders to increase the Toprol XL to 12.5mg   TID . Pt. Encouraged to call back on Monday if not better. Pt. Stated understanding of instructions.

## 2013-03-24 ENCOUNTER — Ambulatory Visit (HOSPITAL_BASED_OUTPATIENT_CLINIC_OR_DEPARTMENT_OTHER): Payer: Medicare Other

## 2013-03-24 VITALS — BP 102/57 | HR 61 | Temp 97.2°F | Resp 18

## 2013-03-24 DIAGNOSIS — C569 Malignant neoplasm of unspecified ovary: Secondary | ICD-10-CM

## 2013-03-24 DIAGNOSIS — Z452 Encounter for adjustment and management of vascular access device: Secondary | ICD-10-CM

## 2013-03-24 MED ORDER — SODIUM CHLORIDE 0.9 % IJ SOLN
10.0000 mL | Freq: Once | INTRAMUSCULAR | Status: AC
  Administered 2013-03-24: 10 mL
  Filled 2013-03-24: qty 10

## 2013-03-24 MED ORDER — HEPARIN SOD (PORK) LOCK FLUSH 100 UNIT/ML IV SOLN
250.0000 [IU] | Freq: Once | INTRAVENOUS | Status: AC
  Administered 2013-03-24: 250 [IU] via INTRAVENOUS
  Filled 2013-03-24: qty 5

## 2013-03-25 ENCOUNTER — Other Ambulatory Visit: Payer: Self-pay | Admitting: *Deleted

## 2013-03-25 DIAGNOSIS — D6859 Other primary thrombophilia: Secondary | ICD-10-CM

## 2013-03-25 DIAGNOSIS — C569 Malignant neoplasm of unspecified ovary: Secondary | ICD-10-CM

## 2013-03-25 MED ORDER — WARFARIN SODIUM 5 MG PO TABS: 5.0000 mg | ORAL_TABLET | Freq: Every day | ORAL | Status: DC

## 2013-03-25 NOTE — Telephone Encounter (Signed)
Patient called requesting refill on warfarin 5 mg tablet.

## 2013-03-27 ENCOUNTER — Ambulatory Visit (HOSPITAL_BASED_OUTPATIENT_CLINIC_OR_DEPARTMENT_OTHER): Payer: Medicare Other

## 2013-03-27 VITALS — BP 101/35 | HR 63 | Temp 97.3°F | Resp 18

## 2013-03-27 DIAGNOSIS — Z95828 Presence of other vascular implants and grafts: Secondary | ICD-10-CM

## 2013-03-27 DIAGNOSIS — C569 Malignant neoplasm of unspecified ovary: Secondary | ICD-10-CM

## 2013-03-27 DIAGNOSIS — Z452 Encounter for adjustment and management of vascular access device: Secondary | ICD-10-CM

## 2013-03-27 MED ORDER — HEPARIN SOD (PORK) LOCK FLUSH 100 UNIT/ML IV SOLN
500.0000 [IU] | Freq: Once | INTRAVENOUS | Status: AC
  Administered 2013-03-27: 250 [IU] via INTRAVENOUS
  Filled 2013-03-27: qty 5

## 2013-03-27 MED ORDER — SODIUM CHLORIDE 0.9 % IJ SOLN
10.0000 mL | INTRAMUSCULAR | Status: DC | PRN
  Administered 2013-03-27: 10 mL via INTRAVENOUS
  Filled 2013-03-27: qty 10

## 2013-03-27 NOTE — Patient Instructions (Signed)
Peripherally Inserted Central Catheter (PICC) Home Guide A peripherally inserted central catheter (PICC) is a long, thin, flexible tube that is inserted into a vein in the upper arm. It is a form of intravenous (IV) access. It is considered to be a "central" line because the tip of the PICC ends in a large vein in your chest. This large vein is called the superior vena cava (SVC). The PICC tip ends in the SVC because there is a lot of blood flow in the SVC. This allows medicines and IV fluids to be quickly distributed throughout the body. The PICC is inserted using a sterile technique by a specially trained nurse or physician. After the PICC is inserted, a chest X-ray is done to be sure it is in the correct place.  A PICC may be placed for different reasons, such as:  To give medicines and liquid nutrition that can only be given through a central line. Examples are:  Certain antibiotic treatments.  Chemotherapy.  Total parenteral nutrition (TPN).  To take frequent blood samples.  To give IV fluids and blood products.  If there is difficulty placing a peripheral intravenous (PIV) catheter. If taken care of properly, a PICC can remain in place for several months. A PICC can also allow patients to go home early. Medicine and PICC care can be managed at home by a family member or home healthcare team. RISKS AND COMPLICATIONS Possible problems with a PICC can occasionally occur. This may include:  A clot (thrombus) forming in or at the tip of the PICC. This can cause the PICC to become clogged. A "clot-busting" medicine called tissue plasminogen activator (tPA) can be inserted into the PICC to help break up the clot.  Inflammation of the vein (phlebitis) in which the PICC is placed. Signs of inflammation may include redness, pain at the insertion site, red streaks, or being able to feel a "cord" in the vein where the PICC is located.  Infection in the PICC or at the insertion site. Signs of  infection may include fever, chills, redness, swelling, or pus drainage from the PICC insertion site.  PICC movement (malposition). The PICC tip may migrate from its original position due to excessive physical activity, forceful coughing, sneezing, or vomiting.  A break or cut in the PICC. It is important to not use scissors near the PICC.  Nerve or tendon irritation or injury during PICC insertion. HOME CARE INSTRUCTIONS Activity  You may bend your arm and move it freely. If your PICC is near or at the bend of your elbow, avoid activity with repeated motion at the elbow.  Avoid lifting heavy objects as instructed by your caregiver.  Avoid using a crutch with the arm on the same side as your PICC. You may need to use a walker. PICC Dressing  Keep your PICC bandage (dressing) clean and dry to prevent infection.  Ask your caregiver when you may shower. Ask your caregiver to teach you how to wrap the PICC when you do take a shower.  Do not bathe, swim, or use hot tubs when you have a PICC.  Change the PICC dressing as instructed by your caregiver.  Change your PICC dressing if it becomes loose or wet. General PICC Care  Check the PICC insertion site daily for leakage, redness, swelling, or pain.  Flush the PICC as directed by your caregiver. Let your caregiver know right away if the PICC is difficult to flush or does not flush. Do not use force   to flush the PICC.  Do not use a syringe that is less than 10 mLs to flush the PICC.  Never pull or tug on the PICC.  Avoid blood pressure checks on the arm with the PICC.  Keep your PICC identification card with you at all times.  Do not take the PICC out yourself. Only a trained clinical professional should remove the PICC. SEEK IMMEDIATE MEDICAL CARE IF:  Your PICC is accidently pulled all the way out. If this happens, cover the insertion site with a bandage or gauze dressing. Do not throw the PICC away. Your caregiver will need to  inspect it.  Your PICC was tugged or pulled and has partially come out. Do not  push the PICC back in.  There is any type of drainage, redness, or swelling where the PICC enters the skin.  You cannot flush the PICC, it is difficult to flush, or the PICC leaks around the insertion site when it is flushed.  You hear a "flushing" sound when the PICC is flushed.  You have pain, discomfort, or numbness in your arm, shoulder, or jaw on the same side as the PICC .  You feel your heart "racing" or skipping beats.  You notice a hole or tear in the PICC.  You develop chills or a fever. MAKE SURE YOU:   Understand these instructions.  Will watch your condition.  Will get help right away if you are not doing well or get worse. Document Released: 10/15/2002 Document Revised: 07/03/2011 Document Reviewed: 08/15/2010 ExitCare Patient Information 2014 [employer].  

## 2013-03-30 ENCOUNTER — Other Ambulatory Visit: Payer: Self-pay | Admitting: Oncology

## 2013-03-31 ENCOUNTER — Encounter: Payer: Self-pay | Admitting: Oncology

## 2013-03-31 ENCOUNTER — Ambulatory Visit (HOSPITAL_BASED_OUTPATIENT_CLINIC_OR_DEPARTMENT_OTHER): Payer: Medicare Other

## 2013-03-31 ENCOUNTER — Other Ambulatory Visit (HOSPITAL_BASED_OUTPATIENT_CLINIC_OR_DEPARTMENT_OTHER): Payer: Medicare Other | Admitting: Lab

## 2013-03-31 ENCOUNTER — Telehealth: Payer: Self-pay | Admitting: *Deleted

## 2013-03-31 ENCOUNTER — Telehealth: Payer: Self-pay

## 2013-03-31 ENCOUNTER — Ambulatory Visit (HOSPITAL_BASED_OUTPATIENT_CLINIC_OR_DEPARTMENT_OTHER): Payer: Medicare Other | Admitting: Pharmacist

## 2013-03-31 ENCOUNTER — Ambulatory Visit (HOSPITAL_BASED_OUTPATIENT_CLINIC_OR_DEPARTMENT_OTHER): Payer: Medicare Other | Admitting: Oncology

## 2013-03-31 VITALS — BP 129/51 | HR 72 | Temp 98.6°F | Resp 18 | Ht 67.0 in | Wt 145.0 lb

## 2013-03-31 DIAGNOSIS — C569 Malignant neoplasm of unspecified ovary: Secondary | ICD-10-CM

## 2013-03-31 DIAGNOSIS — I63432 Cerebral infarction due to embolism of left posterior cerebral artery: Secondary | ICD-10-CM

## 2013-03-31 DIAGNOSIS — Z86718 Personal history of other venous thrombosis and embolism: Secondary | ICD-10-CM

## 2013-03-31 DIAGNOSIS — D6859 Other primary thrombophilia: Secondary | ICD-10-CM

## 2013-03-31 DIAGNOSIS — I8000 Phlebitis and thrombophlebitis of superficial vessels of unspecified lower extremity: Secondary | ICD-10-CM

## 2013-03-31 DIAGNOSIS — Z5111 Encounter for antineoplastic chemotherapy: Secondary | ICD-10-CM

## 2013-03-31 LAB — CBC WITH DIFFERENTIAL/PLATELET
Basophils Absolute: 0 10*3/uL (ref 0.0–0.1)
EOS%: 2.1 % (ref 0.0–7.0)
HCT: 33.3 % — ABNORMAL LOW (ref 34.8–46.6)
HGB: 10.8 g/dL — ABNORMAL LOW (ref 11.6–15.9)
MCH: 30.1 pg (ref 25.1–34.0)
MCHC: 32.5 g/dL (ref 31.5–36.0)
MCV: 92.5 fL (ref 79.5–101.0)
MONO%: 15.9 % — ABNORMAL HIGH (ref 0.0–14.0)
NEUT#: 3.6 10*3/uL (ref 1.5–6.5)
NEUT%: 66.9 % (ref 38.4–76.8)
Platelets: 216 10*3/uL (ref 145–400)
RDW: 20.8 % — ABNORMAL HIGH (ref 11.2–14.5)

## 2013-03-31 LAB — COMPREHENSIVE METABOLIC PANEL (CC13)
Alkaline Phosphatase: 147 U/L (ref 40–150)
Anion Gap: 8 mEq/L (ref 3–11)
BUN: 17.8 mg/dL (ref 7.0–26.0)
CO2: 25 mEq/L (ref 22–29)
Calcium: 9.9 mg/dL (ref 8.4–10.4)
Chloride: 104 mEq/L (ref 98–109)
Creatinine: 0.8 mg/dL (ref 0.6–1.1)
Total Protein: 7.7 g/dL (ref 6.4–8.3)

## 2013-03-31 LAB — POCT INR: INR: 3.3

## 2013-03-31 MED ORDER — ONDANSETRON 8 MG/NS 50 ML IVPB
INTRAVENOUS | Status: AC
  Filled 2013-03-31: qty 8

## 2013-03-31 MED ORDER — SODIUM CHLORIDE 0.9 % IV SOLN
500.0000 mg/m2 | Freq: Once | INTRAVENOUS | Status: AC
  Administered 2013-03-31: 912 mg via INTRAVENOUS
  Filled 2013-03-31: qty 24

## 2013-03-31 MED ORDER — HEPARIN SOD (PORK) LOCK FLUSH 100 UNIT/ML IV SOLN
500.0000 [IU] | Freq: Once | INTRAVENOUS | Status: AC | PRN
  Administered 2013-03-31: 500 [IU]
  Filled 2013-03-31: qty 5

## 2013-03-31 MED ORDER — ACETAMINOPHEN 325 MG PO TABS
ORAL_TABLET | ORAL | Status: AC
  Filled 2013-03-31: qty 2

## 2013-03-31 MED ORDER — SODIUM CHLORIDE 0.9 % IV SOLN
Freq: Once | INTRAVENOUS | Status: AC
  Administered 2013-03-31: 14:00:00 via INTRAVENOUS

## 2013-03-31 MED ORDER — DEXAMETHASONE SODIUM PHOSPHATE 10 MG/ML IJ SOLN
INTRAMUSCULAR | Status: AC
  Filled 2013-03-31: qty 1

## 2013-03-31 MED ORDER — ONDANSETRON 8 MG/50ML IVPB (CHCC)
8.0000 mg | Freq: Once | INTRAVENOUS | Status: AC
  Administered 2013-03-31: 8 mg via INTRAVENOUS

## 2013-03-31 MED ORDER — SODIUM CHLORIDE 0.9 % IJ SOLN
10.0000 mL | INTRAMUSCULAR | Status: DC | PRN
  Administered 2013-03-31: 10 mL
  Filled 2013-03-31: qty 10

## 2013-03-31 MED ORDER — ACETAMINOPHEN 325 MG PO TABS
650.0000 mg | ORAL_TABLET | Freq: Once | ORAL | Status: AC
  Administered 2013-03-31: 650 mg via ORAL

## 2013-03-31 MED ORDER — DEXAMETHASONE SODIUM PHOSPHATE 10 MG/ML IJ SOLN
4.0000 mg | Freq: Once | INTRAMUSCULAR | Status: AC
  Administered 2013-03-31: 4 mg via INTRAVENOUS

## 2013-03-31 NOTE — Telephone Encounter (Signed)
Per staff message and POF I have scheduled appts.  JMW  

## 2013-03-31 NOTE — Telephone Encounter (Signed)
appts made and printed. Pt is aware that tx will be add. i emailed MW to add the tx...td

## 2013-03-31 NOTE — Progress Notes (Signed)
Pt seen in infusion today INR=3.3 She also saw MD prior to infusion who recommended a smaller dose today with a nice dinner salad and then continue 5mg  daily This is the patients instructions We will recheck her INR on Mon, 12/15 with her flush appmts

## 2013-03-31 NOTE — Patient Instructions (Signed)
Take half of a 7.5 mg tablet today and enjoy a big salad dinner, then Continue Coumadin at 5 mg daily.   Recheck INR on 04/07/13 with other scheduled appointments

## 2013-03-31 NOTE — Telephone Encounter (Signed)
[name] PICC line dressing was changed today and noted no sutures anchoring it down by infusion nurse.  Called [facility] IR and [name] was able to go over to IR and have the line resutured.

## 2013-03-31 NOTE — Progress Notes (Signed)
OFFICE PROGRESS NOTE   03/31/2013   Physicians:P.Lazaro Arms, MD (PCP [facility])/ [place] Elpidio Anis, Leanord Hawking   INTERVAL HISTORY:  Patient is seen, alone for visit, in continuing attention to advanced bilateral ovarian cancer, continuing treatment with every other week gemcitabine, which she is tolerating well. Course has been complicated by hypercoagulable state with embolic CVAs and recurrent LE DVTs and thrombophlebitis, and by cardiomyopathy with low EF. Most recent gemzar was given 03-17-13, with treatment planned also today. She continues coumadin, with [facility] coumadin clinic involved. She saw Dr Garnette Scheuermann on 03-11-13.  She has IVC filter and PICC.  Patient has felt fairly well since last chemotherapy other than increased palpitations 11-26,27 and 28. She spoke with cardiologist on call, with metoprolol increased to tid on 03-21-13. She had no chest pain or increased SOB with increased palpitations; she is tolerating the increased beta blocker without difficulty and has noticed improvement in frequency and duration of the palpations. She has had no bleeding, no new or different neurologic symptoms, appetite good, bowels moving, no abdominal distension or discomfort. Energy is adequate.   ONCOLOGIC HISTORY Patient developed constipation with some right abdominal pain in May 2013. She saw GI physician in ~ Aug 2013, but was unable to tolerate prep for colonoscopy such that this was never accomplished. She developed LLE superficial phlebitis in Jan 2014 (dopplers by PCP negative for DVT then), followed by swelling and discomfort LLE with venous doppler at [facility] in [place] on 05-21-12 with acute thrombus left common femoral vein. 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 She was begun on lovenox and transitioned to Xarelto by Dr Everlene Other. CA 125 was 569 initially. She saw Dr Duard Brady on 06-12-12, exam remarkable for distended abdomen with fluid wave and 15 cm fixed mass in pelvis. 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 Neoadjuvant dose dense carbo/taxol was beun 06-21-12 with 3 cycles given thru 08-16-12. She needed neupogen days 2,8 and 16 on this regimen. She had good partial response by CTs 08-19-12 and had improvement in CA 125 from 1041 as baseline for the chemotherapy in late Feb 2014 down to 92 on 08-21-12 after 3 cycles. She had IVC filter placed prior to gyn oncology surgery. She went to exploratory laparotomy with extensive lysis of adhesions, left ureteral lysis, and bilateral salpingo-oophorectomy by Dr Duard Brady on 09-10-12, suboptimal debulking. Operative findings: Diffuse carcinomatosis of the residual omentum involving the entire transverse colon. Transverse colon densely adherent to the anterior abdominal wall. No disease throughout the small bowel. Mesenteric adenopathy with lymph nodes measuring 1 cm, involving the root of the small bowel mesentery. ~5 centimeter right ovarian mass and ~  6 cm left ovarian mass. Small bowel densely adherent to the pelvis. 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 She resumed chemotherapy with dose dense taxol carboplatin on 09-27-12, completing cycle 5 on 11-08-12. CA 125 increased from 92 in April to 130 post op in June, then 225 late July and repeat 318. CT CAP done 11-19-12 showed increase in nodular thickening along falciform ligament and small peritoneal nodule right iliac fossa. Dr Duard Brady recommended change in chemotherapy to doxil with avastin, however echocardiogram 11-27-12 unexpectedly had EF of 15%. She received one cycle of gemzar on 12-02-12, given peripherally, then had embolic CVAs on 0-86-57 while still on xarelto. She was hospitalized 8-13 thru 12-11-12, had some bleeding into the embolic CVA on 12-05-12, and treated with heparin transitioned to coumadin. She was stable to resume gemzar with cycle 2 at 800 mg/m2 given on 12-30-12,then platelets down to 70k by day 8. She had progressive superficial thrombophlebitis LE on coumadin by 12-25-12 and was changed to full dose lovenox then; there has been difficulty getting lovenox covered by insurance. Cycle 3 gemzar 01-13-13 was dose reduced to 600 mg/m2, however platelets still dropped to 90k; she had recurrent embolic CVAs on 8-46-96 and was hospitalized thru 01-28-13, treated with continuous heparin transitioned back to coumadin, plus baby ASA daily. Gemzar cycles 4-6 have been at 500 mg/m2, without significant thrombocytopenia.   Review of systems as above, also: No fever or symptoms of infection. Cord right medial thigh progressively improving, that area no longer uncomfortable, no new superficial cords or LE swelling/ erythema. PICC ok (note suture found to be out) Remainder of 10 point Review of Systems negative.  Objective:  Vital  signs in last 24 hours:  BP 129/51  Pulse 72  Temp(Src) 98.6 F (37 C) (Oral)  Resp 18  Ht 5\' 7"  (1.702 m)  Wt 145 lb (65.772 kg)  BMI 22.71 kg/m2  Alert, oriented and appropriate. Ambulatory without difficulty.  Alopecia  HEENT:PERRL, sclerae not icteric. Oral mucosa moist without lesions, posterior pharynx clear.  Neck supple. No JVD.  Lymphatics:no cervical,suraclavicular, axillary or inguinal adenopathy Resp: clear to auscultation bilaterally and normal percussion bilaterally Cardio: regular rate and rhythm. No gallop. GI: soft, nontender, not distended, no mass or organomegaly. Normally active bowel sounds. Surgical incision not remarkable. Musculoskeletal/ Extremities: RLE medial thigh with nontender cord extending ~ 8 cm, not tender, no erythema or heat, and 2 cm nontender cord at superficial varicosity left calf, otherwise without pitting edema, cords, tenderness. She is wearing knee high Teds. Prominent superficial varicosities bilateral LE as baseline. Neuro: speech fluent. CN seem intact. Dondra Spry not remarkable. No focal motor or cerebellar deficits. Skin without rash, ecchymosis, petechiae Portacath-without erythema or tenderness  Lab Results:  Results for orders placed in visit on 03/31/13  CBC WITH DIFFERENTIAL      Result Value Range   WBC 5.4  3.9 - 10.3 10e3/uL   NEUT# 3.6  1.5 - 6.5 10e3/uL   HGB 10.8 (*) 11.6 - 15.9 g/dL   HCT 29.5 (*) 28.4 - 13.2 %   Platelets 216  145 - 400 10e3/uL   MCV 92.5  79.5 - 101.0 fL   MCH 30.1  25.1 - 34.0 pg  MCHC 32.5  31.5 - 36.0 g/dL   RBC 2.84 (*) 1.32 - 4.40 10e6/uL   RDW 20.8 (*) 11.2 - 14.5 %   lymph# 0.8 (*) 0.9 - 3.3 10e3/uL   MONO# 0.9  0.1 - 0.9 10e3/uL   Eosinophils Absolute 0.1  0.0 - 0.5 10e3/uL   Basophils Absolute 0.0  0.0 - 0.1 10e3/uL   NEUT% 66.9  38.4 - 76.8 %   LYMPH% 14.3  14.0 - 49.7 %   MONO% 15.9 (*) 0.0 - 14.0 %   EOS% 2.1  0.0 - 7.0 %   BASO% 0.8  0.0 - 2.0 %  PROTIME-INR      Result Value  Range   Protime 39.6 (*) 10.6 - 13.4 Seconds   INR 3.30  2.00 - 3.50   Lovenox No    COMPREHENSIVE METABOLIC PANEL (CC13)      Result Value Range   Sodium 137  136 - 145 mEq/L   Potassium 4.4  3.5 - 5.1 mEq/L   Chloride 104  98 - 109 mEq/L   CO2 25  22 - 29 mEq/L   Glucose 85  70 - 140 mg/dl   BUN 10.2  7.0 - 72.5 mg/dL   Creatinine 0.8  0.6 - 1.1 mg/dL   Total Bilirubin 3.66  0.20 - 1.20 mg/dL   Alkaline Phosphatase 147  40 - 150 U/L   AST 33  5 - 34 U/L   ALT 31  0 - 55 U/L   Total Protein 7.7  6.4 - 8.3 g/dL   Albumin 3.6  3.5 - 5.0 g/dL   Calcium 9.9  8.4 - 44.0 mg/dL   Anion Gap 8  3 - 11 mEq/L     Studies/Results:  No results found.  Medications: I have reviewed the patient's current medications. She will decrease coumadin to 1/2 of 7.5 mg tablet today and can have green salad today; she will then continue 5 mg daily with close follow up of INR  DISCUSSION: sutures found to have come out of PICC today, to be replaced by IR also today.  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 Clinically she is remarkable stable given all of issues. Will follow up CA125, which is to be drawn from [facility] in infusion today. As long as no marked changes, will continue gemzar ~ 12-22 and I will see her with gemzar early Jan. Will consider repeat scans in Jan. She is not presently scheduled back to Dr Duard Brady. 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 IVC filter in. Underlying etiology for hypercoagulable state likely the advanced gyn cancer. Very difficult situation. SQ heparin would be next choice if needed, but actually doing well on coumadin + ASA at present. We are trying to  avoid low platelets from chemo. [facility] coumadin clinic assistance appreciated. Known to neurology  3.cardiomyopathy found unexpectedly on echocardiogram done as baseline for doxil, EF 15-20%. Recent increase in palpitations, which should not be caused by the gemzar, better on increased metoprolol in past 10 days. She will call Dr Katrinka Blazing if problem recure.  4.recurrent superficial LE thrombophlebitis: continue coumadin, dosing as above. [facility] coumadin clinic assisting. 5.IVC filter in, which we do not plan to remove 6.power PICC in: protocol is to flush  with heparin twice weekly and dressing changes weekly, done at this office. Appreciate IR help with sutures today. Central line is necessary for gemzar, and patient was not stable for [facility] when PICC was placed. 7. flu vaccine done  8.long tobacco, discontinued  9 Advance Directives and HCPOA completed. With advanced, resistant cancer, multiple CVAs and cardiac disease, I doubt life support would be beneficial overall.   Patient is in agreement with plan above    Anavictoria Wilk P, MD   03/31/2013, 1:38 PM

## 2013-04-01 LAB — CA 125: CA 125: 2719.1 U/mL — ABNORMAL HIGH (ref 0.0–30.2)

## 2013-04-02 ENCOUNTER — Telehealth: Payer: Self-pay

## 2013-04-02 ENCOUNTER — Other Ambulatory Visit: Payer: Self-pay | Admitting: Oncology

## 2013-04-02 DIAGNOSIS — C569 Malignant neoplasm of unspecified ovary: Secondary | ICD-10-CM

## 2013-04-02 NOTE — Telephone Encounter (Signed)
Message copied by Lorine Bears on Wed Apr 02, 2013  2:43 PM ------      Message from: Jama Flavors P      Created: Wed Apr 02, 2013 12:07 PM       Please let her know ca125 fairly stable at 2719. I have let Dr Duard Brady know situation. We will recheck ca125 with next treatment in 2 weeks; we may need to do CT and have her see Dr Duard Brady in Jan. Again, as we discussed, clinically she seems to be doing very well, so we will watch closely but continue present plan for now.            [facility] only ------

## 2013-04-02 NOTE — Telephone Encounter (Signed)
Discussed the results of the CA-125 with Monique [name] as noted below by Dr. Alessandra Bevels.

## 2013-04-03 ENCOUNTER — Ambulatory Visit (HOSPITAL_BASED_OUTPATIENT_CLINIC_OR_DEPARTMENT_OTHER): Payer: Medicare Other

## 2013-04-03 VITALS — BP 130/63 | HR 71 | Temp 98.2°F

## 2013-04-03 DIAGNOSIS — C569 Malignant neoplasm of unspecified ovary: Secondary | ICD-10-CM

## 2013-04-03 DIAGNOSIS — Z452 Encounter for adjustment and management of vascular access device: Secondary | ICD-10-CM

## 2013-04-03 MED ORDER — HEPARIN SOD (PORK) LOCK FLUSH 100 UNIT/ML IV SOLN
250.0000 [IU] | INTRAVENOUS | Status: AC | PRN
  Administered 2013-04-03: 09:00:00
  Filled 2013-04-03: qty 5

## 2013-04-03 MED ORDER — SODIUM CHLORIDE 0.9 % IJ SOLN
10.0000 mL | INTRAMUSCULAR | Status: AC | PRN
  Administered 2013-04-03: 10 mL
  Filled 2013-04-03: qty 10

## 2013-04-04 ENCOUNTER — Telehealth: Payer: Self-pay | Admitting: Interventional Cardiology

## 2013-04-04 MED ORDER — METOPROLOL SUCCINATE ER 25 MG PO TB24: 12.5000 mg | ORAL_TABLET | Freq: Three times a day (TID) | ORAL | Status: DC

## 2013-04-04 NOTE — Telephone Encounter (Signed)
Ordered patient 50 tablets per month since she takes 1/2 tablet tid (metoprolol)

## 2013-04-04 NOTE — Telephone Encounter (Signed)
New message    Taking metoprolol 1/2 tablet tid---only had 15 pills per presc.  This lasts 10 days.  She has plenty refills but she cannot get her medication refilled early because the original presc says 1/2 tablet daily which should last 30 days.  She needs more tablets per refill so that it will last at least 30 days.  Pls call in new pres to [facility].

## 2013-04-07 ENCOUNTER — Ambulatory Visit: Payer: Medicare Other | Admitting: Pharmacist

## 2013-04-07 ENCOUNTER — Ambulatory Visit: Payer: Medicare Other

## 2013-04-07 ENCOUNTER — Telehealth: Payer: Self-pay | Admitting: *Deleted

## 2013-04-07 ENCOUNTER — Other Ambulatory Visit (HOSPITAL_BASED_OUTPATIENT_CLINIC_OR_DEPARTMENT_OTHER): Payer: Medicare Other

## 2013-04-07 VITALS — BP 120/64 | HR 65 | Temp 98.0°F | Resp 18

## 2013-04-07 DIAGNOSIS — I639 Cerebral infarction, unspecified: Secondary | ICD-10-CM

## 2013-04-07 DIAGNOSIS — Z86718 Personal history of other venous thrombosis and embolism: Secondary | ICD-10-CM

## 2013-04-07 DIAGNOSIS — I8000 Phlebitis and thrombophlebitis of superficial vessels of unspecified lower extremity: Secondary | ICD-10-CM

## 2013-04-07 DIAGNOSIS — D6859 Other primary thrombophilia: Secondary | ICD-10-CM

## 2013-04-07 DIAGNOSIS — C561 Malignant neoplasm of right ovary: Secondary | ICD-10-CM

## 2013-04-07 DIAGNOSIS — I63432 Cerebral infarction due to embolism of left posterior cerebral artery: Secondary | ICD-10-CM

## 2013-04-07 LAB — PROTIME-INR: Protime: 38.4 Seconds — ABNORMAL HIGH (ref 10.6–13.4)

## 2013-04-07 MED ORDER — HEPARIN SOD (PORK) LOCK FLUSH 100 UNIT/ML IV SOLN
250.0000 [IU] | Freq: Once | INTRAVENOUS | Status: AC
  Administered 2013-04-07: 250 [IU] via INTRAVENOUS
  Filled 2013-04-07: qty 5

## 2013-04-07 MED ORDER — SODIUM CHLORIDE 0.9 % IJ SOLN
3.0000 mL | Freq: Once | INTRAMUSCULAR | Status: AC
  Administered 2013-04-07: 3 mL via INTRAVENOUS
  Filled 2013-04-07: qty 10

## 2013-04-07 NOTE — Telephone Encounter (Signed)
Pt came by to cancel her appt for her flush on 04/28/13...td

## 2013-04-07 NOTE — Progress Notes (Signed)
INR right at goal (at 3.2 today) Pt reports doing well with no complaints No missed or extra doses No unusual bleeding or bruising No medication or diet changes Pt scheduled for infusion next week Will make no changes to current dose (I think it is better for patient to be closer to 3 for her INR with her hypercoagulable state and recurrent episodes of DVTs etc.) Plan: Continue Coumadin at 5 mg daily.   Recheck INR on 04/14/13 with other scheduled appointments lab at 9:30am and Infusion at 10am. We will see you in infusion

## 2013-04-07 NOTE — Patient Instructions (Signed)

## 2013-04-09 ENCOUNTER — Other Ambulatory Visit: Payer: Self-pay | Admitting: Emergency Medicine

## 2013-04-10 ENCOUNTER — Ambulatory Visit (HOSPITAL_BASED_OUTPATIENT_CLINIC_OR_DEPARTMENT_OTHER): Payer: Medicare Other

## 2013-04-10 VITALS — BP 117/57 | HR 66 | Temp 98.2°F | Resp 18

## 2013-04-10 DIAGNOSIS — C561 Malignant neoplasm of right ovary: Secondary | ICD-10-CM

## 2013-04-10 DIAGNOSIS — Z95828 Presence of other vascular implants and grafts: Secondary | ICD-10-CM

## 2013-04-10 DIAGNOSIS — C569 Malignant neoplasm of unspecified ovary: Secondary | ICD-10-CM

## 2013-04-10 DIAGNOSIS — Z452 Encounter for adjustment and management of vascular access device: Secondary | ICD-10-CM

## 2013-04-10 MED ORDER — HEPARIN SOD (PORK) LOCK FLUSH 100 UNIT/ML IV SOLN
250.0000 [IU] | Freq: Once | INTRAVENOUS | Status: AC
  Administered 2013-04-10: 250 [IU] via INTRAVENOUS
  Filled 2013-04-10: qty 5

## 2013-04-10 MED ORDER — SODIUM CHLORIDE 0.9 % IJ SOLN
3.0000 mL | Freq: Once | INTRAMUSCULAR | Status: AC
  Administered 2013-04-10: 3 mL via INTRAVENOUS
  Filled 2013-04-10: qty 10

## 2013-04-10 NOTE — Patient Instructions (Signed)

## 2013-04-14 ENCOUNTER — Other Ambulatory Visit (HOSPITAL_BASED_OUTPATIENT_CLINIC_OR_DEPARTMENT_OTHER): Payer: Medicare Other

## 2013-04-14 ENCOUNTER — Ambulatory Visit (HOSPITAL_BASED_OUTPATIENT_CLINIC_OR_DEPARTMENT_OTHER): Payer: Medicare Other | Admitting: Pharmacist

## 2013-04-14 ENCOUNTER — Ambulatory Visit (HOSPITAL_BASED_OUTPATIENT_CLINIC_OR_DEPARTMENT_OTHER): Payer: Medicare Other

## 2013-04-14 VITALS — BP 115/65 | HR 66 | Temp 97.2°F | Resp 18

## 2013-04-14 DIAGNOSIS — C569 Malignant neoplasm of unspecified ovary: Secondary | ICD-10-CM

## 2013-04-14 DIAGNOSIS — Z86718 Personal history of other venous thrombosis and embolism: Secondary | ICD-10-CM

## 2013-04-14 DIAGNOSIS — I63432 Cerebral infarction due to embolism of left posterior cerebral artery: Secondary | ICD-10-CM

## 2013-04-14 DIAGNOSIS — I6350 Cerebral infarction due to unspecified occlusion or stenosis of unspecified cerebral artery: Secondary | ICD-10-CM

## 2013-04-14 DIAGNOSIS — I8000 Phlebitis and thrombophlebitis of superficial vessels of unspecified lower extremity: Secondary | ICD-10-CM

## 2013-04-14 DIAGNOSIS — I639 Cerebral infarction, unspecified: Secondary | ICD-10-CM

## 2013-04-14 DIAGNOSIS — D6859 Other primary thrombophilia: Secondary | ICD-10-CM

## 2013-04-14 DIAGNOSIS — Z5111 Encounter for antineoplastic chemotherapy: Secondary | ICD-10-CM

## 2013-04-14 LAB — CBC WITH DIFFERENTIAL/PLATELET
BASO%: 0.5 % (ref 0.0–2.0)
Basophils Absolute: 0 10*3/uL (ref 0.0–0.1)
HCT: 31.3 % — ABNORMAL LOW (ref 34.8–46.6)
HGB: 10.2 g/dL — ABNORMAL LOW (ref 11.6–15.9)
LYMPH%: 17.5 % (ref 14.0–49.7)
MCHC: 32.6 g/dL (ref 31.5–36.0)
MCV: 92.3 fL (ref 79.5–101.0)
MONO#: 0.7 10*3/uL (ref 0.1–0.9)
NEUT%: 67.8 % (ref 38.4–76.8)
Platelets: 186 10*3/uL (ref 145–400)
WBC: 5.8 10*3/uL (ref 3.9–10.3)
lymph#: 1 10*3/uL (ref 0.9–3.3)

## 2013-04-14 LAB — POCT INR: INR: 3.8

## 2013-04-14 LAB — PROTIME-INR

## 2013-04-14 MED ORDER — ONDANSETRON 8 MG/50ML IVPB (CHCC)
8.0000 mg | Freq: Once | INTRAVENOUS | Status: AC
  Administered 2013-04-14: 8 mg via INTRAVENOUS

## 2013-04-14 MED ORDER — HEPARIN SOD (PORK) LOCK FLUSH 100 UNIT/ML IV SOLN
250.0000 [IU] | Freq: Once | INTRAVENOUS | Status: AC | PRN
  Administered 2013-04-14: 250 [IU]
  Filled 2013-04-14: qty 5

## 2013-04-14 MED ORDER — SODIUM CHLORIDE 0.9 % IV SOLN
500.0000 mg/m2 | Freq: Once | INTRAVENOUS | Status: AC
  Administered 2013-04-14: 912 mg via INTRAVENOUS
  Filled 2013-04-14: qty 23.99

## 2013-04-14 MED ORDER — SODIUM CHLORIDE 0.9 % IJ SOLN
10.0000 mL | INTRAMUSCULAR | Status: DC | PRN
  Administered 2013-04-14: 10 mL
  Filled 2013-04-14: qty 10

## 2013-04-14 MED ORDER — DEXAMETHASONE SODIUM PHOSPHATE 10 MG/ML IJ SOLN
INTRAMUSCULAR | Status: AC
  Filled 2013-04-14: qty 1

## 2013-04-14 MED ORDER — SODIUM CHLORIDE 0.9 % IV SOLN
Freq: Once | INTRAVENOUS | Status: AC
  Administered 2013-04-14: 10:00:00 via INTRAVENOUS

## 2013-04-14 MED ORDER — ACETAMINOPHEN 325 MG PO TABS
650.0000 mg | ORAL_TABLET | Freq: Once | ORAL | Status: AC
  Administered 2013-04-14: 650 mg via ORAL

## 2013-04-14 MED ORDER — ACETAMINOPHEN 325 MG PO TABS
ORAL_TABLET | ORAL | Status: AC
  Filled 2013-04-14: qty 2

## 2013-04-14 MED ORDER — ONDANSETRON 8 MG/NS 50 ML IVPB
INTRAVENOUS | Status: AC
  Filled 2013-04-14: qty 8

## 2013-04-14 MED ORDER — DEXAMETHASONE SODIUM PHOSPHATE 10 MG/ML IJ SOLN
4.0000 mg | Freq: Once | INTRAMUSCULAR | Status: AC
  Administered 2013-04-14: 4 mg via INTRAVENOUS

## 2013-04-14 NOTE — Progress Notes (Signed)
INR = 3.8 Pt states she has had minor nose bleeding from "scabs" in her nose from the cold/dry weather.  This is typical for her this time every year. INR still high.   We need to have her hold her Coumadin today. This week she will take 5 mg all days except Wed/Fri she'll take 1/2 of a 7.5 mg tab = 3.75 mg (we gave her samples in the past & she has 5 left). Next week she will take 5 mg all days except Mon/Wed/Fri she'll take 3.75 mg. We went over her dose changes.  She keeps her dosing instructions on her wall at home & crosses of the days she has taken her Coumadin so she feels confident she can follow these instructions. Recheck her INR next Wednesday. Ebony Hail, Pharm.D., CPP 04/14/2013@10 :08 AM

## 2013-04-14 NOTE — Patient Instructions (Signed)
[facility] Discharge Instructions for Patients Receiving Chemotherapy  Today you received the following chemotherapy agents:  Gemzar  To help prevent nausea and vomiting after your treatment, we encourage you to take your nausea medication as ordered per MD.   If you develop nausea and vomiting that is not controlled by your nausea medication, call the clinic.   BELOW ARE SYMPTOMS THAT SHOULD BE REPORTED IMMEDIATELY:  *FEVER GREATER THAN 100.5 F  *CHILLS WITH OR WITHOUT FEVER  NAUSEA AND VOMITING THAT IS NOT CONTROLLED WITH YOUR NAUSEA MEDICATION  *UNUSUAL SHORTNESS OF BREATH  *UNUSUAL BRUISING OR BLEEDING  TENDERNESS IN MOUTH AND THROAT WITH OR WITHOUT PRESENCE OF ULCERS  *URINARY PROBLEMS  *BOWEL PROBLEMS  UNUSUAL RASH Items with * indicate a potential emergency and should be followed up as soon as possible.  Feel free to call the clinic you have any questions or concerns. The clinic phone number is [phone].    

## 2013-04-18 ENCOUNTER — Telehealth: Payer: Self-pay

## 2013-04-18 ENCOUNTER — Other Ambulatory Visit: Payer: Self-pay | Admitting: *Deleted

## 2013-04-18 ENCOUNTER — Other Ambulatory Visit: Payer: Self-pay

## 2013-04-18 ENCOUNTER — Ambulatory Visit: Payer: Medicare Other

## 2013-04-18 ENCOUNTER — Ambulatory Visit (HOSPITAL_BASED_OUTPATIENT_CLINIC_OR_DEPARTMENT_OTHER): Payer: Medicare Other

## 2013-04-18 ENCOUNTER — Encounter: Payer: Self-pay | Admitting: Oncology

## 2013-04-18 ENCOUNTER — Ambulatory Visit (HOSPITAL_BASED_OUTPATIENT_CLINIC_OR_DEPARTMENT_OTHER): Payer: Medicare Other | Admitting: Oncology

## 2013-04-18 ENCOUNTER — Other Ambulatory Visit: Payer: Self-pay | Admitting: Oncology

## 2013-04-18 VITALS — BP 115/52 | HR 71 | Temp 97.0°F | Resp 16

## 2013-04-18 DIAGNOSIS — C561 Malignant neoplasm of right ovary: Secondary | ICD-10-CM

## 2013-04-18 DIAGNOSIS — Z86718 Personal history of other venous thrombosis and embolism: Secondary | ICD-10-CM

## 2013-04-18 DIAGNOSIS — I8003 Phlebitis and thrombophlebitis of superficial vessels of lower extremities, bilateral: Secondary | ICD-10-CM

## 2013-04-18 DIAGNOSIS — D6859 Other primary thrombophilia: Secondary | ICD-10-CM

## 2013-04-18 DIAGNOSIS — C569 Malignant neoplasm of unspecified ovary: Secondary | ICD-10-CM

## 2013-04-18 DIAGNOSIS — K30 Functional dyspepsia: Secondary | ICD-10-CM

## 2013-04-18 DIAGNOSIS — I8000 Phlebitis and thrombophlebitis of superficial vessels of unspecified lower extremity: Secondary | ICD-10-CM

## 2013-04-18 DIAGNOSIS — Z79899 Other long term (current) drug therapy: Secondary | ICD-10-CM

## 2013-04-18 DIAGNOSIS — Z452 Encounter for adjustment and management of vascular access device: Secondary | ICD-10-CM

## 2013-04-18 DIAGNOSIS — D6481 Anemia due to antineoplastic chemotherapy: Secondary | ICD-10-CM

## 2013-04-18 LAB — CBC WITH DIFFERENTIAL/PLATELET
Basophils Absolute: 0 10*3/uL (ref 0.0–0.1)
EOS%: 1.4 % (ref 0.0–7.0)
HCT: 30.2 % — ABNORMAL LOW (ref 34.8–46.6)
HGB: 9.9 g/dL — ABNORMAL LOW (ref 11.6–15.9)
LYMPH%: 14.6 % (ref 14.0–49.7)
MCH: 29.8 pg (ref 25.1–34.0)
MCHC: 32.8 g/dL (ref 31.5–36.0)
MCV: 91 fL (ref 79.5–101.0)
MONO#: 0.1 10*3/uL (ref 0.1–0.9)
MONO%: 1.8 % (ref 0.0–14.0)
NEUT%: 81.8 % — ABNORMAL HIGH (ref 38.4–76.8)
Platelets: 210 10*3/uL (ref 145–400)
RBC: 3.32 10*6/uL — ABNORMAL LOW (ref 3.70–5.45)

## 2013-04-18 LAB — PROTIME-INR

## 2013-04-18 MED ORDER — PANTOPRAZOLE SODIUM 40 MG PO TBEC: 40.0000 mg | DELAYED_RELEASE_TABLET | Freq: Every day | ORAL | Status: DC

## 2013-04-18 MED ORDER — HEPARIN SOD (PORK) LOCK FLUSH 100 UNIT/ML IV SOLN
500.0000 [IU] | Freq: Once | INTRAVENOUS | Status: AC
  Administered 2013-04-18: 500 [IU] via INTRAVENOUS
  Filled 2013-04-18: qty 5

## 2013-04-18 MED ORDER — SODIUM CHLORIDE 0.9 % IJ SOLN
10.0000 mL | INTRAMUSCULAR | Status: DC | PRN
  Administered 2013-04-18: 10 mL via INTRAVENOUS
  Filled 2013-04-18: qty 10

## 2013-04-18 MED ORDER — CEPHALEXIN 500 MG PO CAPS: 500.0000 mg | ORAL_CAPSULE | Freq: Three times a day (TID) | ORAL | Status: DC

## 2013-04-18 NOTE — Telephone Encounter (Signed)
Monique [name] stated that yesterday she began with swelling and redness in left thigh.  Swelling went to knee. Right leg had veinfirm but not red.  Temp yesterday was 99.  Today it is 98.0.    Her coumadin dose was adjusted down on 12-22 as her INR was high.   She has had issues with phlebitis in legs and wanted to let Dr. Darrold Span know.   She is due for PICC flush today at 1200.

## 2013-04-18 NOTE — Patient Instructions (Signed)
Coumadin 5 mg daily including today  Baby ASA 2 tablets today and 2 tablets on Sat, then back to one daily  Keflex sent to pharmacy  Warm soaks as much as possible.

## 2013-04-18 NOTE — Telephone Encounter (Addendum)
Monique [name] will come in for cbc and INR in addition to PICC flush.  Dr. Darrold Span will evaluate legs after the flush.

## 2013-04-18 NOTE — Progress Notes (Signed)
OFFICE PROGRESS NOTE   04/18/2013   Physicians:P.Lazaro Arms, MD ([facility])/ [place] Elpidio Anis, Leanord Hawking   INTERVAL HISTORY:  Patient is seen, alone for visit, as work in today due to 24 hours of symptoms suggesting recurrent superficial thrombophlebitis in LE, despite ongoing coumadin + baby ASA daily. She has not had fever, did use warm soaks when symptoms noticed on 12-25, areas no worse today. She had worn knee high teds prior to this exacerbation, wonders if these were too tight at top band.  INR was slightly high at 3.8 on 12-22 when she was on 5 mg daily, with coumadin held that day then resumed 12-23 at 5 mg daily except 3.75mg  on 12-24 and 12-26. Otherwise she has felt fairly well with exception of slight achiness/ mild flu-like symptoms again 2 days after last gemzar. I have told her that these symptoms are often seen with gemzar and generally improve with tylenol, which is fine for her to use. She has had no increased SOB and no new neurologic symptoms.  PICC is still fine, flushed today. As she has to come to [facility] twice weekly for PICC flushes, we will try to coordinate coumadin clinic/ INR checks with those visits.   ONCOLOGIC HISTORY Patient developed constipation with some right abdominal pain in May 2013. She saw GI physician in ~ Aug 2013, but was unable to tolerate prep for colonoscopy such that this was never accomplished. She developed LLE superficial phlebitis in Jan 2014 (dopplers by PCP negative for DVT then), followed by swelling and discomfort LLE with venous doppler at [employer] [facility] [facility] in [facility] on 05-21-12 with acute thrombus left common femoral vein. 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 She was begun on lovenox and transitioned to Xarelto by Dr Everlene Other. CA 125 was 569 initially. She saw Dr Duard Brady on 06-12-12, exam remarkable for distended abdomen with fluid wave and 15 cm fixed pelvic mass.  She had US paracentesis by IR 06-12-12 for 2.8 liters, cytology ( NZB14 - 126) adenocarcinoma. Neoadjuvant dose dense carbo/taxol was beun 06-21-12 with 3 cycles given thru 08-16-12. She needed neupogen days 2,8 and 16 on this regimen. She had good partial response by CTs 08-19-12 and had improvement in CA 125 from 1041 in Feb 2014 down to 92 on 08-21-12, after 3 cycles dose dense chemo. She had IVC filter placed prior to exploratory laparotomy with extensive lysis of adhesions, left ureteral lysis, and bilateral salpingo-oophorectomy by Dr Duard Brady on 09-10-12, suboptimal debulking. Operative findings:diffuse carcinomatosis of the residual omentum involving the entire transverse colon. Transverse colon densely adherent to the anterior abdominal wall. No disease throughout the small bowel. Mesenteric adenopathy with lymph nodes measuring 1 cm, involving the root of the small bowel mesentery. ~5 centimeter right ovarian mass and ~ 6 cm left ovarian mass. Small bowel densely adherent to the pelvis. 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 She resumed chemotherapy with dose dense taxol carboplatin on 09-27-12, completing cycle 5 on 11-08-12. CA 125 increased from 92 in April to 130 post op in June, then 225 late July and repeat 318. CT CAP done 11-19-12 showed increase in nodular thickening along falciform ligament and small peritoneal nodule right iliac fossa. Dr Duard Brady recommended change in chemotherapy to doxil with avastin, however echocardiogram 11-27-12 unexpectedly had EF of 15%. 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 She was hospitalized 8-13 thru 12-11-12, had some bleeding into the embolic CVA on 12-05-12, and treated with heparin transitioned to coumadin. She was stable to resume gemzar with cycle 2 at 800 mg/m2 given on 12-30-12,then platelets down to 70k by day 8. She had progressive superficial thrombophlebitis LE on coumadin by 12-25-12 and was changed to full dose lovenox then; there has been difficulty getting lovenox covered by insurance. 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 Gemzar has been tolerated well at 500 mg/m2, without significant thrombocytopenia.     Review of systems as above, also: Bowels moving regularly. No bleeding. No chest pain. No pedal edema. Did sleep last pm Remainder of 10 point Review of Systems negative.  Objective:  Vital signs in last 24 hours: Temp 97, BP 115/52, HR 71, resp 16 not labored RA   Alert, oriented and appropriate, very pleasant as always. Ambulatory without  difficulty.  Alopecia  HEENT:PERRL, sclerae not icteric. Oral mucosa moist without lesions, posterior pharynx clear.  Neck supple. No JVD.  Lymphatics:no cervical,suraclavicular or inguinal adenopathy Resp: clear to  auscultation bilaterally and normal percussion bilaterally Cardio: regular rate and rhythm. No gallop. GI: soft, nontender, not distended. Normally active bowel sounds.  Musculoskeletal/ Extremities: LLE with tender, erythematous superficial cords just above knee medially and upper thigh medially, each 4-5 cm long. RLE with slightly more prominent cord medial upper leg to knee, slightly more tender, no erythema or heat.No pedal edema bilaterally. Neuro: no peripheral neuropathy. Speech almost entirely fluent, otherwise nonfocal. Psych  Skin without rash, ecchymosis, petechiae PICC RUE dressing intact, no tenderness or swelling.   Lab Results:  Results for orders placed in visit on 04/18/13  CBC WITH DIFFERENTIAL      Result Value Range   WBC 5.0  3.9 - 10.3 10e3/uL   NEUT# 4.1  1.5 - 6.5 10e3/uL   HGB 9.9 (*) 11.6 - 15.9 g/dL   HCT 21.3 (*) 08.6 - 57.8 %   Platelets 210  145 - 400 10e3/uL   MCV 91.0  79.5 - 101.0 fL   MCH 29.8  25.1 - 34.0 pg   MCHC 32.8  31.5 - 36.0 g/dL   RBC 4.69 (*) 6.29 - 5.28 10e6/uL   RDW 17.5 (*) 11.2 - 14.5 %   lymph# 0.7 (*) 0.9 - 3.3 10e3/uL   MONO# 0.1  0.1 - 0.9 10e3/uL   Eosinophils Absolute 0.1  0.0 - 0.5 10e3/uL   Basophils  Absolute 0.0  0.0 - 0.1 10e3/uL   NEUT% 81.8 (*) 38.4 - 76.8 %   LYMPH% 14.6  14.0 - 49.7 %   MONO% 1.8  0.0 - 14.0 %   EOS% 1.4  0.0 - 7.0 %   BASO% 0.4  0.0 - 2.0 %  PROTIME-INR      Result Value Range   Protime 24.0 (*) 10.6 - 13.4 Seconds   INR 2.00  2.00 - 3.50   Lovenox No       Studies/Results:  No results found.  Medications: I have reviewed the patient's current medications. She has been given written and oral instructions to take baby ASA bid today and tomorrow then back to one daily, and will increase coumadin to 5 mg daily. We will give Keflex 500 mg tid x 1 week.   DISCUSSION: Warm soaks to areas of the superficial thrombophlebitis as much as possible.  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 If we cannot control this way, would try SQ heparin as next intervention. She will let us know if this does not promptly improve. 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 Consider repeat scans when I see her next in Jan, continue gemzar for now with prn taxol for flu-like symptoms after chemo. 3.PICC in. Flushes 2x weekly at this office. PICC necessary for present gemzar, and have not wanted to interrupt anticoagulation for [facility] placement 4.IVC filter in 5.low EF but no CHF symptoms now 6.long past tobacco 7.advance directives and HCPOA in place 8.flu vaccine done. 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 Discussed. She does not feel she is very symptomatic with this.      Reece Packer, MD   04/18/2013, 6:41 PM

## 2013-04-21 ENCOUNTER — Ambulatory Visit (HOSPITAL_BASED_OUTPATIENT_CLINIC_OR_DEPARTMENT_OTHER): Payer: Medicare Other

## 2013-04-21 ENCOUNTER — Ambulatory Visit: Payer: Medicare Other | Admitting: Pharmacist

## 2013-04-21 VITALS — BP 103/52 | HR 68 | Temp 97.5°F

## 2013-04-21 DIAGNOSIS — Z86718 Personal history of other venous thrombosis and embolism: Secondary | ICD-10-CM

## 2013-04-21 DIAGNOSIS — I639 Cerebral infarction, unspecified: Secondary | ICD-10-CM

## 2013-04-21 DIAGNOSIS — Z452 Encounter for adjustment and management of vascular access device: Secondary | ICD-10-CM

## 2013-04-21 DIAGNOSIS — I8003 Phlebitis and thrombophlebitis of superficial vessels of lower extremities, bilateral: Secondary | ICD-10-CM

## 2013-04-21 DIAGNOSIS — C569 Malignant neoplasm of unspecified ovary: Secondary | ICD-10-CM

## 2013-04-21 DIAGNOSIS — I8000 Phlebitis and thrombophlebitis of superficial vessels of unspecified lower extremity: Secondary | ICD-10-CM

## 2013-04-21 LAB — PROTIME-INR

## 2013-04-21 MED ORDER — HEPARIN SOD (PORK) LOCK FLUSH 100 UNIT/ML IV SOLN
500.0000 [IU] | Freq: Once | INTRAVENOUS | Status: AC
  Administered 2013-04-21: 500 [IU] via INTRAVENOUS
  Filled 2013-04-21: qty 5

## 2013-04-21 MED ORDER — SODIUM CHLORIDE 0.9 % IJ SOLN
10.0000 mL | INTRAMUSCULAR | Status: DC | PRN
  Administered 2013-04-21: 10 mL via INTRAVENOUS
  Filled 2013-04-21: qty 10

## 2013-04-21 NOTE — Patient Instructions (Signed)
INR at goal No changes Continue to take 5 mg daily Recheck INR on 04/28/13 at 10 am for lab & 10:30 visit with Dr. Darrold Span, 11:30am for infusion and we will see you in infusion

## 2013-04-21 NOTE — Patient Instructions (Signed)
Peripherally Inserted Central Catheter (PICC) Home Guide A peripherally inserted central catheter (PICC) is a long, thin, flexible tube that is inserted into a vein in the upper arm. It is a form of intravenous (IV) access. It is considered to be a "central" line because the tip of the PICC ends in a large vein in your chest. This large vein is called the superior vena cava (SVC). The PICC tip ends in the SVC because there is a lot of blood flow in the SVC. This allows medicines and IV fluids to be quickly distributed throughout the body. The PICC is inserted using a sterile technique by a specially trained nurse or physician. After the PICC is inserted, a chest X-ray is done to be sure it is in the correct place.  A PICC may be placed for different reasons, such as:  To give medicines and liquid nutrition that can only be given through a central line. Examples are:  Certain antibiotic treatments.  Chemotherapy.  Total parenteral nutrition (TPN).  To take frequent blood samples.  To give IV fluids and blood products.  If there is difficulty placing a peripheral intravenous (PIV) catheter. If taken care of properly, a PICC can remain in place for several months. A PICC can also allow patients to go home early. Medicine and PICC care can be managed at home by a family member or home healthcare team. RISKS AND COMPLICATIONS Possible problems with a PICC can occasionally occur. This may include:  A clot (thrombus) forming in or at the tip of the PICC. This can cause the PICC to become clogged. A "clot-busting" medicine called tissue plasminogen activator (tPA) can be inserted into the PICC to help break up the clot.  Inflammation of the vein (phlebitis) in which the PICC is placed. Signs of inflammation may include redness, pain at the insertion site, red streaks, or being able to feel a "cord" in the vein where the PICC is located.  Infection in the PICC or at the insertion site. Signs of  infection may include fever, chills, redness, swelling, or pus drainage from the PICC insertion site.  PICC movement (malposition). The PICC tip may migrate from its original position due to excessive physical activity, forceful coughing, sneezing, or vomiting.  A break or cut in the PICC. It is important to not use scissors near the PICC.  Nerve or tendon irritation or injury during PICC insertion. HOME CARE INSTRUCTIONS Activity  You may bend your arm and move it freely. If your PICC is near or at the bend of your elbow, avoid activity with repeated motion at the elbow.  Avoid lifting heavy objects as instructed by your caregiver.  Avoid using a crutch with the arm on the same side as your PICC. You may need to use a walker. PICC Dressing  Keep your PICC bandage (dressing) clean and dry to prevent infection.  Ask your caregiver when you may shower. Ask your caregiver to teach you how to wrap the PICC when you do take a shower.  Do not bathe, swim, or use hot tubs when you have a PICC.  Change the PICC dressing as instructed by your caregiver.  Change your PICC dressing if it becomes loose or wet. General PICC Care  Check the PICC insertion site daily for leakage, redness, swelling, or pain.  Flush the PICC as directed by your caregiver. Let your caregiver know right away if the PICC is difficult to flush or does not flush. Do not use force   to flush the PICC.  Do not use a syringe that is less than 10 mLs to flush the PICC.  Never pull or tug on the PICC.  Avoid blood pressure checks on the arm with the PICC.  Keep your PICC identification card with you at all times.  Do not take the PICC out yourself. Only a trained clinical professional should remove the PICC. SEEK IMMEDIATE MEDICAL CARE IF:  Your PICC is accidently pulled all the way out. If this happens, cover the insertion site with a bandage or gauze dressing. Do not throw the PICC away. Your caregiver will need to  inspect it.  Your PICC was tugged or pulled and has partially come out. Do not  push the PICC back in.  There is any type of drainage, redness, or swelling where the PICC enters the skin.  You cannot flush the PICC, it is difficult to flush, or the PICC leaks around the insertion site when it is flushed.  You hear a "flushing" sound when the PICC is flushed.  You have pain, discomfort, or numbness in your arm, shoulder, or jaw on the same side as the PICC .  You feel your heart "racing" or skipping beats.  You notice a hole or tear in the PICC.  You develop chills or a fever. MAKE SURE YOU:   Understand these instructions.  Will watch your condition.  Will get help right away if you are not doing well or get worse. Document Released: 10/15/2002 Document Revised: 07/03/2011 Document Reviewed: 08/15/2010 ExitCare Patient Information 2014 [employer].  

## 2013-04-21 NOTE — Progress Notes (Signed)
INR at goal Pt is doing well with no complaints other then recurrent superficial thrombophlebitis Dr. Darrold Span increased Ms. Steck's coumadin back to 5 mg daily on 12/26 when her INR was on the low end of goal at 2.  Ms. Beahm INR has now trended up nicely over the weekend to 2.7. 5mg  of coumadin daily may be slightly on the higher side for Ms. Hogland but she feels most comfortable being towards the high end of goal range and does not want to decrease her dose at this time I feel this is reasonable and we will recheck her in one week. If her INR is elevated on 04/28/13 recommend adding in 1 or 2 days of 3.75 mg (half of 7.5 mg tablet) Pt reports no bleeding or bruising No missed or extra doses Currently taking Keflex tid x 1 week (will finish on Friday) this may have slight increase to INR. Will follow up on Monday No changes to regimen for now Plan: Continue to take 5 mg daily Recheck INR on 04/28/13 at 10 am for lab & 10:30 visit with Dr. Darrold Span, 11:30am for infusion and we will see you in infusion

## 2013-04-22 ENCOUNTER — Telehealth: Payer: Self-pay | Admitting: *Deleted

## 2013-04-22 NOTE — Telephone Encounter (Signed)
Notified patient Dr Darrold Span wants her to complete keflex and let us know if she has any problems before seeing Dr Darrold Span on 04/28/12

## 2013-04-22 NOTE — Telephone Encounter (Signed)
Pt left a voice mail stating her "phlebitis" is better and she has enough keflex to last until Thursday. Is wondering if she needs to do anything else?

## 2013-04-23 ENCOUNTER — Ambulatory Visit: Payer: Medicare Other

## 2013-04-23 ENCOUNTER — Other Ambulatory Visit: Payer: Medicare Other

## 2013-04-25 ENCOUNTER — Telehealth: Payer: Self-pay

## 2013-04-25 ENCOUNTER — Ambulatory Visit (HOSPITAL_BASED_OUTPATIENT_CLINIC_OR_DEPARTMENT_OTHER): Payer: Medicare Other

## 2013-04-25 VITALS — BP 118/43 | HR 69 | Temp 97.8°F

## 2013-04-25 DIAGNOSIS — I8003 Phlebitis and thrombophlebitis of superficial vessels of lower extremities, bilateral: Secondary | ICD-10-CM

## 2013-04-25 DIAGNOSIS — Z452 Encounter for adjustment and management of vascular access device: Secondary | ICD-10-CM

## 2013-04-25 DIAGNOSIS — C569 Malignant neoplasm of unspecified ovary: Secondary | ICD-10-CM

## 2013-04-25 MED ORDER — CEPHALEXIN 500 MG PO CAPS: 500.0000 mg | ORAL_CAPSULE | Freq: Three times a day (TID) | ORAL | Status: DC

## 2013-04-25 MED ORDER — SODIUM CHLORIDE 0.9 % IJ SOLN
10.0000 mL | INTRAMUSCULAR | Status: DC | PRN
  Administered 2013-04-25: 10 mL via INTRAVENOUS
  Filled 2013-04-25: qty 10

## 2013-04-25 MED ORDER — HEPARIN SOD (PORK) LOCK FLUSH 100 UNIT/ML IV SOLN
500.0000 [IU] | Freq: Once | INTRAVENOUS | Status: AC
  Administered 2013-04-25: 250 [IU] via INTRAVENOUS
  Filled 2013-04-25: qty 5

## 2013-04-25 NOTE — Telephone Encounter (Signed)
Ms. Penaflor called stating that she took her last dose of keflex this am at 0600. The areas are improving except on the right area just above the ankle is hard , red, and painful.  The right leg is a little swollen. Sent in prescription for 3 more days of keflex.  Patient to continue Baby ASA 81 mg bid per Dr. Marko Plume.  Ms. Dortch verbalized understanding.

## 2013-04-25 NOTE — Patient Instructions (Signed)
Peripherally Inserted Central Catheter (PICC) Home Guide A peripherally inserted central catheter (PICC) is a long, thin, flexible tube that is inserted into a vein in the upper arm. It is a form of intravenous (IV) access. It is considered to be a "central" line because the tip of the PICC ends in a large vein in your chest. This large vein is called the superior vena cava (SVC). The PICC tip ends in the SVC because there is a lot of blood flow in the SVC. This allows medicines and IV fluids to be quickly distributed throughout the body. The PICC is inserted using a sterile technique by a specially trained nurse or physician. After the PICC is inserted, a chest X-ray is done to be sure it is in the correct place.  A PICC may be placed for different reasons, such as:  To give medicines and liquid nutrition that can only be given through a central line. Examples are:  Certain antibiotic treatments.  Chemotherapy.  Total parenteral nutrition (TPN).  To take frequent blood samples.  To give IV fluids and blood products.  If there is difficulty placing a peripheral intravenous (PIV) catheter. If taken care of properly, a PICC can remain in place for several months. A PICC can also allow patients to go home early. Medicine and PICC care can be managed at home by a family member or home healthcare team. RISKS AND COMPLICATIONS Possible problems with a PICC can occasionally occur. This may include:  A clot (thrombus) forming in or at the tip of the PICC. This can cause the PICC to become clogged. A "clot-busting" medicine called tissue plasminogen activator (tPA) can be inserted into the PICC to help break up the clot.  Inflammation of the vein (phlebitis) in which the PICC is placed. Signs of inflammation may include redness, pain at the insertion site, red streaks, or being able to feel a "cord" in the vein where the PICC is located.  Infection in the PICC or at the insertion site. Signs of  infection may include fever, chills, redness, swelling, or pus drainage from the PICC insertion site.  PICC movement (malposition). The PICC tip may migrate from its original position due to excessive physical activity, forceful coughing, sneezing, or vomiting.  A break or cut in the PICC. It is important to not use scissors near the PICC.  Nerve or tendon irritation or injury during PICC insertion. HOME CARE INSTRUCTIONS Activity  You may bend your arm and move it freely. If your PICC is near or at the bend of your elbow, avoid activity with repeated motion at the elbow.  Avoid lifting heavy objects as instructed by your caregiver.  Avoid using a crutch with the arm on the same side as your PICC. You may need to use a walker. PICC Dressing  Keep your PICC bandage (dressing) clean and dry to prevent infection.  Ask your caregiver when you may shower. Ask your caregiver to teach you how to wrap the PICC when you do take a shower.  Do not bathe, swim, or use hot tubs when you have a PICC.  Change the PICC dressing as instructed by your caregiver.  Change your PICC dressing if it becomes loose or wet. General PICC Care  Check the PICC insertion site daily for leakage, redness, swelling, or pain.  Flush the PICC as directed by your caregiver. Let your caregiver know right away if the PICC is difficult to flush or does not flush. Do not use force   to flush the PICC.  Do not use a syringe that is less than 10 mLs to flush the PICC.  Never pull or tug on the PICC.  Avoid blood pressure checks on the arm with the PICC.  Keep your PICC identification card with you at all times.  Do not take the PICC out yourself. Only a trained clinical professional should remove the PICC. SEEK IMMEDIATE MEDICAL CARE IF:  Your PICC is accidently pulled all the way out. If this happens, cover the insertion site with a bandage or gauze dressing. Do not throw the PICC away. Your caregiver will need to  inspect it.  Your PICC was tugged or pulled and has partially come out. Do not  push the PICC back in.  There is any type of drainage, redness, or swelling where the PICC enters the skin.  You cannot flush the PICC, it is difficult to flush, or the PICC leaks around the insertion site when it is flushed.  You hear a "flushing" sound when the PICC is flushed.  You have pain, discomfort, or numbness in your arm, shoulder, or jaw on the same side as the PICC .  You feel your heart "racing" or skipping beats.  You notice a hole or tear in the PICC.  You develop chills or a fever. MAKE SURE YOU:   Understand these instructions.  Will watch your condition.  Will get help right away if you are not doing well or get worse. Document Released: 10/15/2002 Document Revised: 07/03/2011 Document Reviewed: 08/15/2010 ExitCare Patient Information 2014 [employer].  

## 2013-04-27 ENCOUNTER — Other Ambulatory Visit: Payer: Self-pay | Admitting: Oncology

## 2013-04-28 ENCOUNTER — Other Ambulatory Visit: Payer: Self-pay | Admitting: *Deleted

## 2013-04-28 ENCOUNTER — Ambulatory Visit (HOSPITAL_BASED_OUTPATIENT_CLINIC_OR_DEPARTMENT_OTHER): Payer: Medicare Other | Admitting: Pharmacist

## 2013-04-28 ENCOUNTER — Encounter: Payer: Self-pay | Admitting: Oncology

## 2013-04-28 ENCOUNTER — Ambulatory Visit (HOSPITAL_BASED_OUTPATIENT_CLINIC_OR_DEPARTMENT_OTHER): Payer: Medicare Other | Admitting: Oncology

## 2013-04-28 ENCOUNTER — Other Ambulatory Visit: Payer: Medicare Other

## 2013-04-28 ENCOUNTER — Telehealth: Payer: Self-pay | Admitting: Oncology

## 2013-04-28 ENCOUNTER — Other Ambulatory Visit (HOSPITAL_BASED_OUTPATIENT_CLINIC_OR_DEPARTMENT_OTHER): Payer: Medicare Other

## 2013-04-28 ENCOUNTER — Ambulatory Visit (HOSPITAL_BASED_OUTPATIENT_CLINIC_OR_DEPARTMENT_OTHER): Payer: Medicare Other

## 2013-04-28 VITALS — BP 133/75 | HR 74 | Temp 99.1°F | Resp 18 | Ht 67.0 in | Wt 149.2 lb

## 2013-04-28 DIAGNOSIS — I8000 Phlebitis and thrombophlebitis of superficial vessels of unspecified lower extremity: Secondary | ICD-10-CM

## 2013-04-28 DIAGNOSIS — I63432 Cerebral infarction due to embolism of left posterior cerebral artery: Secondary | ICD-10-CM

## 2013-04-28 DIAGNOSIS — Z5111 Encounter for antineoplastic chemotherapy: Secondary | ICD-10-CM

## 2013-04-28 DIAGNOSIS — I8003 Phlebitis and thrombophlebitis of superficial vessels of lower extremities, bilateral: Secondary | ICD-10-CM

## 2013-04-28 DIAGNOSIS — C569 Malignant neoplasm of unspecified ovary: Secondary | ICD-10-CM

## 2013-04-28 DIAGNOSIS — D6859 Other primary thrombophilia: Secondary | ICD-10-CM

## 2013-04-28 DIAGNOSIS — C561 Malignant neoplasm of right ovary: Secondary | ICD-10-CM

## 2013-04-28 DIAGNOSIS — D649 Anemia, unspecified: Secondary | ICD-10-CM

## 2013-04-28 LAB — COMPREHENSIVE METABOLIC PANEL (CC13)
ALT: 38 U/L (ref 0–55)
AST: 46 U/L — AB (ref 5–34)
Albumin: 3.5 g/dL (ref 3.5–5.0)
Alkaline Phosphatase: 189 U/L — ABNORMAL HIGH (ref 40–150)
Anion Gap: 9 mEq/L (ref 3–11)
BILIRUBIN TOTAL: 0.71 mg/dL (ref 0.20–1.20)
BUN: 16.9 mg/dL (ref 7.0–26.0)
CO2: 24 meq/L (ref 22–29)
Calcium: 9.8 mg/dL (ref 8.4–10.4)
Chloride: 104 mEq/L (ref 98–109)
Creatinine: 0.8 mg/dL (ref 0.6–1.1)
Glucose: 91 mg/dl (ref 70–140)
Potassium: 5 mEq/L (ref 3.5–5.1)
SODIUM: 137 meq/L (ref 136–145)
TOTAL PROTEIN: 7.8 g/dL (ref 6.4–8.3)

## 2013-04-28 LAB — CBC WITH DIFFERENTIAL/PLATELET
BASO%: 0.3 % (ref 0.0–2.0)
Basophils Absolute: 0 10*3/uL (ref 0.0–0.1)
EOS%: 1.4 % (ref 0.0–7.0)
Eosinophils Absolute: 0.1 10*3/uL (ref 0.0–0.5)
HCT: 31.5 % — ABNORMAL LOW (ref 34.8–46.6)
HGB: 10.2 g/dL — ABNORMAL LOW (ref 11.6–15.9)
LYMPH%: 15.9 % (ref 14.0–49.7)
MCH: 30.2 pg (ref 25.1–34.0)
MCHC: 32.4 g/dL (ref 31.5–36.0)
MCV: 93.2 fL (ref 79.5–101.0)
MONO#: 0.7 10*3/uL (ref 0.1–0.9)
MONO%: 10.7 % (ref 0.0–14.0)
NEUT#: 4.9 10*3/uL (ref 1.5–6.5)
NEUT%: 71.7 % (ref 38.4–76.8)
PLATELETS: 189 10*3/uL (ref 145–400)
RBC: 3.38 10*6/uL — AB (ref 3.70–5.45)
RDW: 18.2 % — ABNORMAL HIGH (ref 11.2–14.5)
WBC: 6.9 10*3/uL (ref 3.9–10.3)
lymph#: 1.1 10*3/uL (ref 0.9–3.3)

## 2013-04-28 LAB — PROTIME-INR
INR: 3.6 — ABNORMAL HIGH (ref 2.00–3.50)
Protime: 43.2 Seconds — ABNORMAL HIGH (ref 10.6–13.4)

## 2013-04-28 MED ORDER — CEPHALEXIN 500 MG PO CAPS: 500.0000 mg | ORAL_CAPSULE | Freq: Three times a day (TID) | ORAL | Status: DC

## 2013-04-28 MED ORDER — DEXAMETHASONE SODIUM PHOSPHATE 20 MG/5ML IJ SOLN
INTRAMUSCULAR | Status: AC
  Filled 2013-04-28: qty 5

## 2013-04-28 MED ORDER — SODIUM CHLORIDE 0.9 % IJ SOLN
10.0000 mL | INTRAMUSCULAR | Status: DC | PRN
  Administered 2013-04-28: 10 mL
  Filled 2013-04-28: qty 10

## 2013-04-28 MED ORDER — ONDANSETRON 8 MG/NS 50 ML IVPB
INTRAVENOUS | Status: AC
  Filled 2013-04-28: qty 8

## 2013-04-28 MED ORDER — DEXAMETHASONE SODIUM PHOSPHATE 10 MG/ML IJ SOLN
4.0000 mg | Freq: Once | INTRAMUSCULAR | Status: AC
  Administered 2013-04-28: 4 mg via INTRAVENOUS

## 2013-04-28 MED ORDER — ACETAMINOPHEN 325 MG PO TABS
ORAL_TABLET | ORAL | Status: AC
  Filled 2013-04-28: qty 2

## 2013-04-28 MED ORDER — SODIUM CHLORIDE 0.9 % IV SOLN
500.0000 mg/m2 | Freq: Once | INTRAVENOUS | Status: AC
  Administered 2013-04-28: 912 mg via INTRAVENOUS
  Filled 2013-04-28: qty 23.99

## 2013-04-28 MED ORDER — WARFARIN SODIUM 5 MG PO TABS: 5.0000 mg | ORAL_TABLET | Freq: Every day | ORAL | Status: DC

## 2013-04-28 MED ORDER — SODIUM CHLORIDE 0.9 % IV SOLN
Freq: Once | INTRAVENOUS | Status: AC
  Administered 2013-04-28: 12:00:00 via INTRAVENOUS

## 2013-04-28 MED ORDER — DEXAMETHASONE SODIUM PHOSPHATE 10 MG/ML IJ SOLN
INTRAMUSCULAR | Status: AC
  Filled 2013-04-28: qty 1

## 2013-04-28 MED ORDER — ONDANSETRON 8 MG/50ML IVPB (CHCC)
8.0000 mg | Freq: Once | INTRAVENOUS | Status: AC
  Administered 2013-04-28: 8 mg via INTRAVENOUS

## 2013-04-28 MED ORDER — HEPARIN SOD (PORK) LOCK FLUSH 100 UNIT/ML IV SOLN
250.0000 [IU] | Freq: Once | INTRAVENOUS | Status: AC | PRN
  Administered 2013-04-28: 250 [IU]
  Filled 2013-04-28: qty 5

## 2013-04-28 MED ORDER — ACETAMINOPHEN 325 MG PO TABS
650.0000 mg | ORAL_TABLET | Freq: Once | ORAL | Status: AC
  Administered 2013-04-28: 650 mg via ORAL

## 2013-04-28 NOTE — Patient Instructions (Signed)
Take 3.75mg  today.  On 04/29/13, continue 5mg  daily.  Recheck INR on 05/02/13 per MD.

## 2013-04-28 NOTE — Patient Instructions (Signed)
[facility] Discharge Instructions for Patients Receiving Chemotherapy  Today you received the following chemotherapy agents Gemzar.  To help prevent nausea and vomiting after your treatment, we encourage you to take your nausea medication as prescribed.   If you develop nausea and vomiting that is not controlled by your nausea medication, call the clinic.   BELOW ARE SYMPTOMS THAT SHOULD BE REPORTED IMMEDIATELY:  *FEVER GREATER THAN 100.5 F  *CHILLS WITH OR WITHOUT FEVER  NAUSEA AND VOMITING THAT IS NOT CONTROLLED WITH YOUR NAUSEA MEDICATION  *UNUSUAL SHORTNESS OF BREATH  *UNUSUAL BRUISING OR BLEEDING  TENDERNESS IN MOUTH AND THROAT WITH OR WITHOUT PRESENCE OF ULCERS  *URINARY PROBLEMS  *BOWEL PROBLEMS  UNUSUAL RASH Items with * indicate a potential emergency and should be followed up as soon as possible.  Feel free to call the clinic you have any questions or concerns. The clinic phone number is [phone].    

## 2013-04-28 NOTE — Progress Notes (Signed)
OFFICE PROGRESS NOTE   04/28/2013   Physicians:.P.Gehrig, Phineas Inches, MD ([facility]P [facility])/ [place], Kara Pacer   INTERVAL HISTORY:  Patient is seen, alone for visit, in continuing attention to advanced high grade serous carcinoma of bilateral ovaries, situation complicated by embolic CVAs, low EF and recurrent superficial thrombophlebitis of LE for which she continues coumadin + baby ASA. She has just completed another 10 days of Keflex, with improvement but not yet complete resolution of another flare of the LE thrombophlebitis. She continues every 2 week gemzar, cycle 10 due today; this was begun 12-02-12 instead of doxil due to low EF. She has PICC in as the gemzar requires central line and as we have not wanted to stop anticoagulation to have PAC placed (has IVC filter in, but note embolic CVAs). CA 125 has not decreased further in past several weeks despite ongoing chemo. Last CT AP was 11-19-12. She missed last appointment with Dr Alycia Rossetti due to hospitalizations with CVAs. Clinically she has not had obvious abdominal or pelvic symptoms in last several months and has not had CHF or other clear cardiac symptoms. IVC filter in. PICC in since 12-26-12.  ONCOLOGIC HISTORY Patient developed constipation with some right abdominal pain in May 2013. She saw GI physician in ~ Aug 2013, but was unable to tolerate prep for colonoscopy. She developed LLE superficial phlebitis in Jan 2014 , followed by swelling and discomfort LLE with venous doppler at [facility] in [facility] on 05-21-12 with acute thrombus left common femoral vein. 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 She was begun on lovenox and transitioned to Xarelto by Dr Coletta Memos. CA 125 was 569. She saw Dr Alycia Rossetti on 06-12-12, with distended abdomen and 15 cm fixed pelvic mass. She had US paracentesis by IR 06-12-12 for 2.8 liters, cytology ( NZB14 - 126) adenocarcinoma. Neoadjuvant dose dense carbo/taxol was beun 06-21-12 with 3 cycles given thru 08-16-12. She needed neupogen days 2,8 and 16 on this regimen. She had good partial response by CTs 08-19-12 and had improvement in CA 125 from 1041 in Feb 2014 down to 92 on 08-21-12, after 3 cycles dose dense chemo. She had IVC filter placed prior to exploratory laparotomy with extensive lysis of adhesions, left ureteral lysis, and bilateral salpingo-oophorectomy by Dr Alycia Rossetti on 09-10-12, suboptimal debulking. Operative findings:diffuse carcinomatosis of the residual omentum involving the entire transverse colon. Transverse colon densely adherent to the anterior abdominal wall. No disease throughout the small bowel. Mesenteric adenopathy with lymph nodes measuring 1 cm, involving the root of the small bowel mesentery. ~5 centimeter right ovarian mass and ~ 6 cm left ovarian mass. Small bowel densely adherent to the pelvis. 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 Pathology [phone]): high grade serous carcinoma of bilateral ovaries. Chemotherapy resumed with dose dense taxol carboplatin on 09-27-12, completing cycle 5 on 11-08-12.  CA 125 increased from 92 in April to 130 post op in June, then 225 late July and repeat 318. CT CAP done 11-19-12 showed increase in nodular thickening along falciform ligament and small peritoneal nodule right iliac fossa. Plan was to change in chemotherapy to doxil with avastin, however echocardiogram 11-27-12 unexpectedly had EF of 15%. 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 She was hospitalized 8-13 thru 12-11-12, had some bleeding into the embolic CVA on [id number], and treated with heparin transitioned to coumadin. She was stable to resume gemzar with cycle 2 at 800 mg/m2 given on 12-30-12,then platelets down to 70k by day 8. She had progressive superficial thrombophlebitis LE on coumadin by 12-25-12 and was changed to full dose lovenox then; there was difficulty getting lovenox covered by insurance. Cycle 3 gemzar 01-13-13 was dose reduced to 600 mg/m2, however platelets still dropped to 90k; she had recurrent embolic CVAs on [id number] and was hospitalized thru 01-28-13, treated with continuous heparin transitioned back to coumadin, plus baby ASA daily. Gemzar has been tolerated well at 500 mg/m2, without significant thrombocytopenia.  Review of systems as above, also: New area of tenderness right medial lower leg on 04-25-13 less tender just today. No fever. Bowels ok. No bleeding. Rarely needing nausea meds. No abdominal or pelvic swelling or pain. No SOB, no chest pain. PICC still ok, being flushed 2x weekly at this office. She does sponge baths, no showering and does not bathe in tub with PICC. Remainder of 10 point Review of Systems negative.  She mentions so much time at this office with coumadin monitoring, PICC flushes, MD, chemo. She is willing to continue with anything that is helpful, but we did discuss fact that, if chemo changes, hopefully treatment can be by peripheral vein and PICC DCd. Prefers chemo on Fridays if we  change regimen and if MD visits coordinate.  Home computer access is difficult, cannot get onto MyChart; we will need to call with labs.  Objective:  Vital signs in last 24 hours:  BP 133/75  Pulse 74  Temp(Src) 99.1 F (37.3 C) (Oral)  Resp 18  Ht 5\' 7"  (1.702 m)  Wt 149 lb 3.2 oz (67.677 kg)  BMI 23.36 kg/m2  Alert, oriented and appropriate. Ambulatory without difficulty.  Hair is growing back tho she is wearing wig.  HEENT:PERRL, sclerae not icteric. Oral mucosa moist without lesions, posterior pharynx clear.  Neck supple. No JVD.  Lymphatics:no cervical,suraclavicular, axillary or inguinal adenopathy Resp: clear to auscultation bilaterally and normal percussion bilaterally Cardio: regular rate and rhythm. No gallop. GI: soft, nontender, not distended, no appreciable mass or organomegaly. Normally active bowel sounds. Surgical incision not remarkable. Musculoskeletal/ Extremities: without pitting edema. Improving areas of thrombophlebitis upper thighs, above left knee posteromedially with tiny residual induration not tender, discoloration without heat or erythema there. Right medial lower leg with induration 2x5 cm, minimally tender, slightly pink, not hot Neuro: Speech mostly fluent. CN, motor, cerebellar no focal deficits.  Skin without rash, ecchymosis, petechiae PICC dressed, no swelling or tenderness.  Lab Results:  Results for orders placed in visit on 04/28/13  CBC WITH DIFFERENTIAL      Result Value Range   WBC 6.9  3.9 - 10.3 10e3/uL  NEUT# 4.9  1.5 - 6.5 10e3/uL   HGB 10.2 (*) 11.6 - 15.9 g/dL   HCT 31.5 (*) 34.8 - 46.6 %   Platelets 189  145 - 400 10e3/uL   MCV 93.2  79.5 - 101.0 fL   MCH 30.2  25.1 - 34.0 pg   MCHC 32.4  31.5 - 36.0 g/dL   RBC 3.38 (*) 3.70 - 5.45 10e6/uL   RDW 18.2 (*) 11.2 - 14.5 %   lymph# 1.1  0.9 - 3.3 10e3/uL   MONO# 0.7  0.1 - 0.9 10e3/uL   Eosinophils Absolute 0.1  0.0 - 0.5 10e3/uL   Basophils Absolute 0.0  0.0 - 0.1 10e3/uL    NEUT% 71.7  38.4 - 76.8 %   LYMPH% 15.9  14.0 - 49.7 %   MONO% 10.7  0.0 - 14.0 %   EOS% 1.4  0.0 - 7.0 %   BASO% 0.3  0.0 - 2.0 %  PROTIME-INR      Result Value Range   Protime 43.2 (*) 10.6 - 13.4 Seconds   INR 3.60 (*) 2.00 - 3.50   Lovenox No     INR likely higher with present Keflex.   CA 125 available after visit up to 3049, this having been 2700 in early Dec and 2330 in Nov.   Studies/Results:  No results found. Patient in agreement with repeating CT AP, as last scan of this type was 11-19-12. We will coordinate scan shortly prior to seeing Dr Alycia Rossetti, as patient will be anxious to know results.  Medications: I have reviewed the patient's current medications. She will decrease coumadin to 1/2 of 7.5 mg tablet = 3.75 mg today, then continue 5 mg daily + salad each day that she is on Keflex (another 4 days of antibiotic). She will use baby ASA only one daily while on antibiotic. We will recheck INR with PICC flush on 05-02-13. Continue present Keflex to complete total 14 days this course. She can use tylenol 500 mg q 6 hrs x ~ 2 days for fever/ aches after gemzar. If symptoms worse than usual or not improved with this she will call office, due to widespread influenza.  DISCUSSION: Clinically she still seems clinically stable from standpoint of extensive gyn cancer with present gemzar, but no improvement/ increase in marker (available after visit) and ongoing hypercoagulable state. Will reevaluate with CT AP, Dr Alycia Rossetti able to see her Jan 21. Note she is not candidate for doxil due to low EF and I have been concerned about chemo related thrombocytopenia due to need to keep her well anticoagulated. I do not think avastin appropriate due to prior bleeding into the embolic CVAs. She has not had topotecan or oral VP16, or taxotere; would obviously have to watch platelets closely with these. She is remarkably functional despite CVAs and EF 15%, but very high risk.  Assessment/Plan:  1.high  grade serous carcinoma of bilateral ovaries: initial response to taxol carbo, suboptimal debulking, progression on further taxol [place], probable early progression now on gemzar. Multiple significant comorbidities limiting treatment options. For repeat CT and gyn oncology follow up as above. She was given cycle 10 gemzar today. 2.recurrent superficial thrombophlebitis: continue Keflex for 4 more days due to area right lower leg. Continue coumadin trying to keep INR 3.0: she will take 1/2 of 7.5 mg today then continue 5 mg daily + baby ASA one daily while on Keflex + salad daily while on Keflex. Will repeat INR with PICC flush on 05-02-13. If  cannot control with coumadin, would try SQ heparin as next intervention. 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 Some subsequent bleeding into the CVAs. She is minimally symptomatic including speech now. Known to neurology. 4.IVC filter in due to DVT early in cancer course 5.Ejection fraction 15% by echo 11-27-12. Dr Daneen Schick following. 5.PICC in as gemzar has required central line. If treatment changes, hopefully will not need PICC ongoing. 6.long past tobacco 7.flu vaccine done 8.anemia: multifactorial, slightly better, not more symptomatic. Follow 9.Advanced directives done, friend Clair Gulling is HCPOA  Patient in agreement with plan, tho anxious about scans.  This note routed to Dr Alycia Rossetti. Time spent 45 min including >50% counseling and coordination of care  [place], MD   04/28/2013, 11:44 AM

## 2013-04-28 NOTE — Patient Instructions (Signed)
Coumadin 1/2 of 7.5 mg tablet = 3.75 mg today, then continue 5 mg daily + salad daily while on Keflex.  Will continue Keflex another 4 days for total 14 days.  Baby ASA just once daily while on Keflex

## 2013-04-28 NOTE — Progress Notes (Signed)
INR = 3.6 Pt seen by MD today. Coumadin 3.75mg  today, then continue 5mg  daily. Recheck INR on 05/02/13

## 2013-04-29 ENCOUNTER — Encounter: Payer: Self-pay | Admitting: *Deleted

## 2013-04-29 ENCOUNTER — Telehealth: Payer: Self-pay | Admitting: *Deleted

## 2013-04-29 LAB — CA 125: CA 125: 3049.7 U/mL — ABNORMAL HIGH (ref 0.0–30.2)

## 2013-04-29 NOTE — Telephone Encounter (Signed)
Message copied by Cherylynn Ridges on Tue Apr 29, 2013  3:17 PM ------      Message from: Gordy Levan      Created: Tue Apr 29, 2013  8:46 AM       Labs seen and need follow up:ok to let her know marker is still trending up. I think repeat scans as we have planned are a good idea now, and I'm glad she will be seeing Dr Alycia Rossetti again after the scans. Can let her know this on Jan 6 or Jan 7 ok.  thanks ------

## 2013-04-29 NOTE — Telephone Encounter (Signed)
Called patient about labs, scans and follow up as indicated by Dr. Marko Plume.

## 2013-04-30 ENCOUNTER — Telehealth: Payer: Self-pay | Admitting: *Deleted

## 2013-04-30 NOTE — Telephone Encounter (Signed)
Pt states she spoke with Triage nurse yesterday regarding this message

## 2013-04-30 NOTE — Telephone Encounter (Signed)
Message copied by Patton Salles on Wed Apr 30, 2013 12:08 PM ------      Message from: Gordy Levan      Created: Tue Apr 29, 2013  8:46 AM       Labs seen and need follow up:ok to let her know marker is still trending up. I think repeat scans as we have planned are a good idea now, and I'm glad she will be seeing Dr Alycia Rossetti again after the scans. Can let her know this on Jan 6 or Jan 7 ok.  thanks ------

## 2013-05-02 ENCOUNTER — Ambulatory Visit: Payer: Medicare Other | Admitting: Pharmacist

## 2013-05-02 ENCOUNTER — Telehealth: Payer: Self-pay | Admitting: Oncology

## 2013-05-02 ENCOUNTER — Ambulatory Visit (HOSPITAL_BASED_OUTPATIENT_CLINIC_OR_DEPARTMENT_OTHER): Payer: Medicare Other | Admitting: Lab

## 2013-05-02 ENCOUNTER — Ambulatory Visit: Payer: Medicare Other

## 2013-05-02 VITALS — BP 103/64 | HR 70 | Temp 97.6°F

## 2013-05-02 DIAGNOSIS — Z86718 Personal history of other venous thrombosis and embolism: Secondary | ICD-10-CM

## 2013-05-02 DIAGNOSIS — I639 Cerebral infarction, unspecified: Secondary | ICD-10-CM

## 2013-05-02 DIAGNOSIS — I8000 Phlebitis and thrombophlebitis of superficial vessels of unspecified lower extremity: Secondary | ICD-10-CM

## 2013-05-02 DIAGNOSIS — I8003 Phlebitis and thrombophlebitis of superficial vessels of lower extremities, bilateral: Secondary | ICD-10-CM

## 2013-05-02 DIAGNOSIS — I63432 Cerebral infarction due to embolism of left posterior cerebral artery: Secondary | ICD-10-CM

## 2013-05-02 DIAGNOSIS — Z452 Encounter for adjustment and management of vascular access device: Secondary | ICD-10-CM

## 2013-05-02 LAB — PROTIME-INR
INR: 3.3 (ref 2.00–3.50)
Protime: 39.6 Seconds — ABNORMAL HIGH (ref 10.6–13.4)

## 2013-05-02 LAB — POCT INR: INR: 3.3

## 2013-05-02 MED ORDER — HEPARIN SOD (PORK) LOCK FLUSH 100 UNIT/ML IV SOLN
500.0000 [IU] | Freq: Once | INTRAVENOUS | Status: AC
  Administered 2013-05-02: 250 [IU] via INTRAVENOUS
  Filled 2013-05-02: qty 5

## 2013-05-02 MED ORDER — SODIUM CHLORIDE 0.9 % IJ SOLN
10.0000 mL | INTRAMUSCULAR | Status: DC | PRN
  Administered 2013-05-02: 10 mL via INTRAVENOUS
  Filled 2013-05-02: qty 10

## 2013-05-02 NOTE — Progress Notes (Signed)
INR = 3.3 on Coumadin 5 mg daily Pt completes 14 days of Keflex today for cellulitis/phlebitis inner thigh & RLE. Having small nosebleeds, occasional (not new) when she blows her nose.  She has scabs from dry weather. No missed Coumadin doses.   She had a small salad last night w/ some spinach leaves. INR a little elevated.  She will stay on the same Coumadin dose since she is going off Keflex today.  We may see a lowering of her INR once she is off Keflex. She is taking 1 baby ASA daily for now when her INR is up a little.  Dr. Marko Plume has been guiding her on this. CT scan on 1/20 so we'll see her that day. Kennith Center, Pharm.D., CPP 05/02/2013@10 :44 AM

## 2013-05-02 NOTE — Patient Instructions (Signed)
Peripherally Inserted Central Catheter (PICC) Home Guide A peripherally inserted central catheter (PICC) is a long, thin, flexible tube that is inserted into a vein in the upper arm. It is a form of intravenous (IV) access. It is considered to be a "central" line because the tip of the PICC ends in a large vein in your chest. This large vein is called the superior vena cava (SVC). The PICC tip ends in the SVC because there is a lot of blood flow in the SVC. This allows medicines and IV fluids to be quickly distributed throughout the body. The PICC is inserted using a sterile technique by a specially trained nurse or physician. After the PICC is inserted, a chest X-ray is done to be sure it is in the correct place.  A PICC may be placed for different reasons, such as:  To give medicines and liquid nutrition that can only be given through a central line. Examples are:  Certain antibiotic treatments.  Chemotherapy.  Total parenteral nutrition (TPN).  To take frequent blood samples.  To give IV fluids and blood products.  If there is difficulty placing a peripheral intravenous (PIV) catheter. If taken care of properly, a PICC can remain in place for several months. A PICC can also allow patients to go home early. Medicine and PICC care can be managed at home by a family member or home healthcare team. RISKS AND COMPLICATIONS Possible problems with a PICC can occasionally occur. This may include:  A clot (thrombus) forming in or at the tip of the PICC. This can cause the PICC to become clogged. A "clot-busting" medicine called tissue plasminogen activator (tPA) can be inserted into the PICC to help break up the clot.  Inflammation of the vein (phlebitis) in which the PICC is placed. Signs of inflammation may include redness, pain at the insertion site, red streaks, or being able to feel a "cord" in the vein where the PICC is located.  Infection in the PICC or at the insertion site. Signs of  infection may include fever, chills, redness, swelling, or pus drainage from the PICC insertion site.  PICC movement (malposition). The PICC tip may migrate from its original position due to excessive physical activity, forceful coughing, sneezing, or vomiting.  A break or cut in the PICC. It is important to not use scissors near the PICC.  Nerve or tendon irritation or injury during PICC insertion. HOME CARE INSTRUCTIONS Activity  You may bend your arm and move it freely. If your PICC is near or at the bend of your elbow, avoid activity with repeated motion at the elbow.  Avoid lifting heavy objects as instructed by your caregiver.  Avoid using a crutch with the arm on the same side as your PICC. You may need to use a walker. PICC Dressing  Keep your PICC bandage (dressing) clean and dry to prevent infection.  Ask your caregiver when you may shower. Ask your caregiver to teach you how to wrap the PICC when you do take a shower.  Do not bathe, swim, or use hot tubs when you have a PICC.  Change the PICC dressing as instructed by your caregiver.  Change your PICC dressing if it becomes loose or wet. General PICC Care  Check the PICC insertion site daily for leakage, redness, swelling, or pain.  Flush the PICC as directed by your caregiver. Let your caregiver know right away if the PICC is difficult to flush or does not flush. Do not use force   to flush the PICC.  Do not use a syringe that is less than 10 mLs to flush the PICC.  Never pull or tug on the PICC.  Avoid blood pressure checks on the arm with the PICC.  Keep your PICC identification card with you at all times.  Do not take the PICC out yourself. Only a trained clinical professional should remove the PICC. SEEK IMMEDIATE MEDICAL CARE IF:  Your PICC is accidently pulled all the way out. If this happens, cover the insertion site with a bandage or gauze dressing. Do not throw the PICC away. Your caregiver will need to  inspect it.  Your PICC was tugged or pulled and has partially come out. Do not  push the PICC back in.  There is any type of drainage, redness, or swelling where the PICC enters the skin.  You cannot flush the PICC, it is difficult to flush, or the PICC leaks around the insertion site when it is flushed.  You hear a "flushing" sound when the PICC is flushed.  You have pain, discomfort, or numbness in your arm, shoulder, or jaw on the same side as the PICC .  You feel your heart "racing" or skipping beats.  You notice a hole or tear in the PICC.  You develop chills or a fever. MAKE SURE YOU:   Understand these instructions.  Will watch your condition.  Will get help right away if you are not doing well or get worse. Document Released: 10/15/2002 Document Revised: 07/03/2011 Document Reviewed: 08/15/2010 ExitCare Patient Information 2014 [employer].  

## 2013-05-02 NOTE — Telephone Encounter (Signed)
Gave pt appt for flush and dressing change every Monday and Thursday per pt rqst, until she see MD on February 2015

## 2013-05-05 ENCOUNTER — Ambulatory Visit: Payer: Medicare Other

## 2013-05-05 ENCOUNTER — Ambulatory Visit (HOSPITAL_BASED_OUTPATIENT_CLINIC_OR_DEPARTMENT_OTHER): Payer: Medicare Other | Admitting: Oncology

## 2013-05-05 ENCOUNTER — Encounter: Payer: Self-pay | Admitting: Oncology

## 2013-05-05 ENCOUNTER — Other Ambulatory Visit: Payer: Self-pay

## 2013-05-05 ENCOUNTER — Ambulatory Visit (HOSPITAL_BASED_OUTPATIENT_CLINIC_OR_DEPARTMENT_OTHER): Payer: Medicare Other

## 2013-05-05 VITALS — BP 108/50 | HR 74 | Temp 98.1°F

## 2013-05-05 DIAGNOSIS — Z87891 Personal history of nicotine dependence: Secondary | ICD-10-CM

## 2013-05-05 DIAGNOSIS — I428 Other cardiomyopathies: Secondary | ICD-10-CM

## 2013-05-05 DIAGNOSIS — C561 Malignant neoplasm of right ovary: Secondary | ICD-10-CM

## 2013-05-05 DIAGNOSIS — C569 Malignant neoplasm of unspecified ovary: Secondary | ICD-10-CM

## 2013-05-05 DIAGNOSIS — Z8673 Personal history of transient ischemic attack (TIA), and cerebral infarction without residual deficits: Secondary | ICD-10-CM

## 2013-05-05 DIAGNOSIS — I8000 Phlebitis and thrombophlebitis of superficial vessels of unspecified lower extremity: Secondary | ICD-10-CM

## 2013-05-05 DIAGNOSIS — Z452 Encounter for adjustment and management of vascular access device: Secondary | ICD-10-CM

## 2013-05-05 MED ORDER — SODIUM CHLORIDE 0.9 % IJ SOLN
10.0000 mL | INTRAMUSCULAR | Status: DC | PRN
  Administered 2013-05-05: 10 mL via INTRAVENOUS
  Filled 2013-05-05: qty 10

## 2013-05-05 MED ORDER — HEPARIN SOD (PORK) LOCK FLUSH 100 UNIT/ML IV SOLN
500.0000 [IU] | Freq: Once | INTRAVENOUS | Status: AC
  Administered 2013-05-05: 250 [IU] via INTRAVENOUS
  Filled 2013-05-05: qty 5

## 2013-05-05 NOTE — Patient Instructions (Signed)
Peripherally Inserted Central Catheter (PICC) Removal and Care After A peripherally inserted catheter (PICC) is removed when it is no longer needed, when it is clotted, or when it may be infected.  PROCEDURE  The removal of a PICC is usually painless. Removing the tape that holds the PICC in place may be the most discomfort you have.  A physicians order needs to be obtained to have the PICC removed.  A PICC can be removed in the hospital or in an outpatient setting.  Never remove or take out the PICC yourself. Only a trained clinical professional, such as a PICC nurse, should remove the PICC.  If a PICC is suspected to be infected, the PICC tip is sent to the lab for culture. HOME CARE INSTRUCTIONS  When the PICC is out, pressure is applied at the insertion site to prevent bleeding. An antibiotic ointment may be applied to the insertion site. A dry, sterile gauze is then taped over the insertion site. This dressing should stay on for 24 hours.  After the 24 hours is up, the dressing may be removed. The PICC insertion site is very small. A small scab may develop over the insertion site. It is okay to wash the site gently with soap and water. Be careful to not remove or pick the scab off. After washing, gently pat the site dry. You do not need to put another dressing over the insertion site after you wash it.  Avoid heavy, strenuous physical activity for 24 hours after the PICC is removed. This includes things like:  Weight lifting.  Strenuous yard work.  Any physical activity with repetitive arm movement. SEEK MEDICAL CARE IF:  Call or see your caregiver as soon as possible if you develop the following conditions in the arm in which the PICC was inserted:  Swelling or puffiness.  Increasing tenderness or pain. SEEK IMMEDIATE MEDICAL CARE IF:  You develop any of the following conditions in the arm that had the PICC:  Numbness or tingling in your fingers, hand, or arm.  You arm has  a bluish color and it is cold to the touch.  Redness around the insertion site or a red-streak that goes up your arm.  Any type of drainage from the PICC insertion site. This includes drainage such as:  Bleeding from the insertion site. (If this happens, apply firm, direct pressure to the PICC insertion site with a clean towel.)  Drainage that is yellow or tan in color.  You have an oral temperature above 102 F (38.9 C), not controlled by medicine. Document Released: 09/28/2009 Document Revised: 07/03/2011 Document Reviewed: 09/28/2009 [employer] Patient Information 2014 [employer].  

## 2013-05-05 NOTE — Progress Notes (Signed)
Pt's Picc area has red, patchy areas. Pt states it is itchy and tender to the touch. Denies fever, not warm to the touch.  Dr. Marko Plume came to see the pt and advised [place] to remove the picc.

## 2013-05-05 NOTE — Progress Notes (Signed)
Per Dr. Marko Plume direction, PICC removed at 1150.  Patient in reclining position during procedure and during 30 minute post observation period.  Pressure applied to site for 5 minutes, dressed with gauze and coban.  Patient given written and verbal instructions for care.  Discharged to home, ambulatory.

## 2013-05-05 NOTE — Progress Notes (Signed)
OFFICE PROGRESS NOTE   05/05/2013   Physicians:P.Clayton Bibles, MD (PCP [facility])/ [place], Kara Pacer   INTERVAL HISTORY:  Patient is seen in flush room for unscheduled visit due to skin irritation around PICC. Patient reports that she may have scratched under dressing edges in her sleep, as the prep seemed irritating. She has had no fever, no swelling or streaking in the arm, no other related symptoms. The PICC was placed by IR on 12-26-12 and has been used for gemzar, which we may decide not to continue after reevaluation scans upcoming. Her peripheral IV access is otherwise good.   Otherwise the areas of recurrent superficial thrombophlebitis bilateral LE have continued to improve, including right ankle, and no new areas of concern. She has had no significant bleeding, still on coumadin and baby ASA.   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 Most recent chemotherapy has been every other week gemzar from 12-02-2012 thru 04-28-2013. She is for CT 05-13-13 and to see Dr Alycia Rossetti on 05-14-13. She is clinically stable from standpoint of gyn cancer, tho marker remains elevated. For full details of oncologic history, see my note of 04-28-13 and prior.   No new or different neurologic complaints. Remainder of 10 point Review of Systems negative/ unchanged..  Objective:  Vital signs in last 24 hours:  108/50, 74 regular, 98.1, respirations 18/ not labored RA. Last weight 149 lbs. Alert, oriented and appropriate. NAD seated in flush room. Ambulatory without difficulty.   HEENT:PERRL, sclerae not icteric. Oral mucosa moist Heart RRR. Respirations not labored RA RUE no swelling or tenderness. PICC insertion site not remarkable. Out at least 3 cm from insertion site, at periphery of dressing area,  are erythematous, excoriated areas without drainage.  LE: trace pedal edema right, none left. No longer erythema or heat at right medial lower leg, not tender, much softer. Minimal cord left medial thigh. Extensive varicose veins as baseline. Neuro: speech fluent and appropriate. CN, motor, cerebellar nonfocal. Psych normal affect.     Lab Results:  Results for orders placed in visit on 05/02/13  POCT INR      Result Value Range   INR 3.3       Studies/Results:  No results found.  Medications: I have reviewed the patient's current medications.  DISCUSSION: I have recommended that PICC be removed today, as this is significant irritation in area of line now in place for 4 months. If we decide to continue gemzar we can get another central line, or if regimen is best changed after restaging scan upcoming, she may be fine with just peripheral IVs. Patient agrees with this plan. She will keep the skin areas clean and will let us know if they do not progressively improve.  Assessment/Plan: 1.skin irritation away from insertion site of PICC that has been in for 4 months: as PICC may well not be needed ongoing, it will be removed now, patient to be taken to bed in infusion area for this procedure by trained RN. Plan as above. 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 Plan as above. Choices for further chemo limited by poor cardiac function and need for continuous full anticoagulation. 3.embolic CVAs fall 2025  4.recurrent superficial thrombophlebitis: see course in my last notes, now trying to keep INR ~ 3.0 and on baby ASA, 2 recent courses of Keflex helpful. If need to change anticoagulation would go to SQ heparin. Has IVC filter in.  5.cardiomyopathy with EF 15% 6.DNR patient's request but still treating in attempt to control  disease. 7.long past tobacco 8.flu vaccine done    Patient is comfortable with discussion and plan. PICC flushes DCd. Continue coumadin management appointments. Time spent 25 min including >50% counseling and coordination of care.  Gordy Levan, MD   05/05/2013, 12:54 PM

## 2013-05-06 ENCOUNTER — Encounter: Payer: Self-pay | Admitting: Oncology

## 2013-05-13 ENCOUNTER — Other Ambulatory Visit: Payer: Self-pay | Admitting: Oncology

## 2013-05-13 ENCOUNTER — Ambulatory Visit (HOSPITAL_COMMUNITY)
Admission: RE | Admit: 2013-05-13 | Discharge: 2013-05-13 | Disposition: A | Discharge status: Home / Self Care | Payer: Medicare Other | Source: Ambulatory Visit | Attending: Oncology | Admitting: Oncology

## 2013-05-13 ENCOUNTER — Other Ambulatory Visit (HOSPITAL_BASED_OUTPATIENT_CLINIC_OR_DEPARTMENT_OTHER): Payer: Medicare Other

## 2013-05-13 ENCOUNTER — Ambulatory Visit (HOSPITAL_BASED_OUTPATIENT_CLINIC_OR_DEPARTMENT_OTHER): Payer: Medicare Other

## 2013-05-13 ENCOUNTER — Ambulatory Visit (HOSPITAL_BASED_OUTPATIENT_CLINIC_OR_DEPARTMENT_OTHER): Payer: Medicare Other | Admitting: Pharmacist

## 2013-05-13 VITALS — Temp 98.4°F | Wt 154.5 lb

## 2013-05-13 DIAGNOSIS — I639 Cerebral infarction, unspecified: Secondary | ICD-10-CM

## 2013-05-13 DIAGNOSIS — Z86718 Personal history of other venous thrombosis and embolism: Secondary | ICD-10-CM

## 2013-05-13 DIAGNOSIS — C569 Malignant neoplasm of unspecified ovary: Secondary | ICD-10-CM

## 2013-05-13 DIAGNOSIS — R1909 Other intra-abdominal and pelvic swelling, mass and lump: Secondary | ICD-10-CM | POA: Insufficient documentation

## 2013-05-13 DIAGNOSIS — C561 Malignant neoplasm of right ovary: Secondary | ICD-10-CM

## 2013-05-13 DIAGNOSIS — R599 Enlarged lymph nodes, unspecified: Secondary | ICD-10-CM | POA: Insufficient documentation

## 2013-05-13 DIAGNOSIS — I8000 Phlebitis and thrombophlebitis of superficial vessels of unspecified lower extremity: Secondary | ICD-10-CM

## 2013-05-13 DIAGNOSIS — C787 Secondary malignant neoplasm of liver and intrahepatic bile duct: Secondary | ICD-10-CM | POA: Insufficient documentation

## 2013-05-13 DIAGNOSIS — J90 Pleural effusion, not elsewhere classified: Secondary | ICD-10-CM | POA: Insufficient documentation

## 2013-05-13 DIAGNOSIS — Z9221 Personal history of antineoplastic chemotherapy: Secondary | ICD-10-CM | POA: Insufficient documentation

## 2013-05-13 DIAGNOSIS — R188 Other ascites: Secondary | ICD-10-CM | POA: Insufficient documentation

## 2013-05-13 LAB — CBC WITH DIFFERENTIAL/PLATELET
BASO%: 1 % (ref 0.0–2.0)
BASOS ABS: 0.1 10*3/uL (ref 0.0–0.1)
EOS ABS: 0.1 10*3/uL (ref 0.0–0.5)
EOS%: 1.1 % (ref 0.0–7.0)
HCT: 29.2 % — ABNORMAL LOW (ref 34.8–46.6)
HEMOGLOBIN: 9.6 g/dL — AB (ref 11.6–15.9)
LYMPH%: 11.5 % — ABNORMAL LOW (ref 14.0–49.7)
MCH: 31.6 pg (ref 25.1–34.0)
MCHC: 32.7 g/dL (ref 31.5–36.0)
MCV: 96.5 fL (ref 79.5–101.0)
MONO#: 1.1 10*3/uL — ABNORMAL HIGH (ref 0.1–0.9)
MONO%: 14.3 % — ABNORMAL HIGH (ref 0.0–14.0)
NEUT%: 72.1 % (ref 38.4–76.8)
NEUTROS ABS: 5.3 10*3/uL (ref 1.5–6.5)
Platelets: 223 10*3/uL (ref 145–400)
RBC: 3.03 10*6/uL — ABNORMAL LOW (ref 3.70–5.45)
RDW: 20.5 % — ABNORMAL HIGH (ref 11.2–14.5)
WBC: 7.4 10*3/uL (ref 3.9–10.3)
lymph#: 0.8 10*3/uL — ABNORMAL LOW (ref 0.9–3.3)

## 2013-05-13 LAB — PROTIME-INR
INR: 4.5 — ABNORMAL HIGH (ref 2.00–3.50)
Protime: 54 Seconds — ABNORMAL HIGH (ref 10.6–13.4)

## 2013-05-13 LAB — POCT INR: INR: 4.5

## 2013-05-13 MED ORDER — IOHEXOL 300 MG/ML  SOLN
100.0000 mL | Freq: Once | INTRAMUSCULAR | Status: AC | PRN
  Administered 2013-05-13: 100 mL via INTRAVENOUS

## 2013-05-13 MED ORDER — IOHEXOL 300 MG/ML  SOLN
50.0000 mL | Freq: Once | INTRAMUSCULAR | Status: AC | PRN
  Administered 2013-05-13: 50 mL via ORAL

## 2013-05-13 NOTE — Progress Notes (Signed)
INR = 4.5 on Coumadin 5 mg daily Hgb = 9.6; Pltc = 223 Temp = 98.4 Wt = 154.5 lb (up from 149 lbs on 04/28/13) RUE swelling above PICC removal site.  Slightly warm, red & tender.  Temp = 98.7 (oral).  States she was afebrile at home today earlier.  A PA in Radiology will evaluate this today at CT scan. Face "puffy".  B LE swelling.  She is not wearing support hose due to them potentially causing phlebitis. SOB at times; not constant.  She states her rings feel tight.  She had difficulty removing one of them in clinic today. Not eating a lot of extra sodium.  She generally avoids salty foods. She has CT scan today so she has been drinking 16 oz of contrast.  She noticed the swelling before today, however.  She stated in the AM's her legs have not been swollen. INR elevated.  Hold Coumadin & baby ASA (pt had already taken her ASA today; she'll hold it tomorrow). Recheck INR tomorrow.  Pt is most comfortable w/ this rather than holding multiple days of Coumadin.  She is fearful the phlebitis will return. She will come for INR tomorrow before her appt w/ Dr. Alycia Rossetti.  We can communicate INR results, etc w/ Dr Marko Plume if needed. I have d/w Dr Marko Plume over the phone today.  She will keep a look out for CT scan results.  If pts SOB worsens, she should be in touch w/ Dr. Tamala Julian (cardiology; decr EF) or go to ER. Kennith Center, Pharm.D., CPP 05/13/2013@12 :32 PM

## 2013-05-14 ENCOUNTER — Other Ambulatory Visit: Payer: Self-pay | Admitting: Oncology

## 2013-05-14 ENCOUNTER — Other Ambulatory Visit (HOSPITAL_BASED_OUTPATIENT_CLINIC_OR_DEPARTMENT_OTHER): Payer: Medicare Other

## 2013-05-14 ENCOUNTER — Ambulatory Visit: Payer: Medicare Other | Attending: Gynecologic Oncology | Admitting: Gynecologic Oncology

## 2013-05-14 ENCOUNTER — Telehealth: Payer: Self-pay | Admitting: Oncology

## 2013-05-14 ENCOUNTER — Telehealth: Payer: Self-pay

## 2013-05-14 ENCOUNTER — Ambulatory Visit (HOSPITAL_BASED_OUTPATIENT_CLINIC_OR_DEPARTMENT_OTHER): Payer: Medicare Other | Admitting: Pharmacist

## 2013-05-14 ENCOUNTER — Encounter: Payer: Self-pay | Admitting: Gynecologic Oncology

## 2013-05-14 VITALS — BP 128/67 | HR 82 | Temp 98.6°F | Resp 18 | Ht 67.0 in | Wt 152.8 lb

## 2013-05-14 DIAGNOSIS — Z87891 Personal history of nicotine dependence: Secondary | ICD-10-CM | POA: Insufficient documentation

## 2013-05-14 DIAGNOSIS — R1900 Intra-abdominal and pelvic swelling, mass and lump, unspecified site: Secondary | ICD-10-CM | POA: Insufficient documentation

## 2013-05-14 DIAGNOSIS — I8010 Phlebitis and thrombophlebitis of unspecified femoral vein: Secondary | ICD-10-CM | POA: Insufficient documentation

## 2013-05-14 DIAGNOSIS — R599 Enlarged lymph nodes, unspecified: Secondary | ICD-10-CM | POA: Insufficient documentation

## 2013-05-14 DIAGNOSIS — Z9079 Acquired absence of other genital organ(s): Secondary | ICD-10-CM | POA: Insufficient documentation

## 2013-05-14 DIAGNOSIS — Z86718 Personal history of other venous thrombosis and embolism: Secondary | ICD-10-CM

## 2013-05-14 DIAGNOSIS — Z79899 Other long term (current) drug therapy: Secondary | ICD-10-CM | POA: Insufficient documentation

## 2013-05-14 DIAGNOSIS — K219 Gastro-esophageal reflux disease without esophagitis: Secondary | ICD-10-CM | POA: Insufficient documentation

## 2013-05-14 DIAGNOSIS — I8000 Phlebitis and thrombophlebitis of superficial vessels of unspecified lower extremity: Secondary | ICD-10-CM

## 2013-05-14 DIAGNOSIS — C569 Malignant neoplasm of unspecified ovary: Secondary | ICD-10-CM | POA: Insufficient documentation

## 2013-05-14 DIAGNOSIS — C787 Secondary malignant neoplasm of liver and intrahepatic bile duct: Secondary | ICD-10-CM | POA: Insufficient documentation

## 2013-05-14 DIAGNOSIS — I69928 Other speech and language deficits following unspecified cerebrovascular disease: Secondary | ICD-10-CM | POA: Insufficient documentation

## 2013-05-14 DIAGNOSIS — I63432 Cerebral infarction due to embolism of left posterior cerebral artery: Secondary | ICD-10-CM

## 2013-05-14 DIAGNOSIS — Z7901 Long term (current) use of anticoagulants: Secondary | ICD-10-CM | POA: Insufficient documentation

## 2013-05-14 DIAGNOSIS — M79609 Pain in unspecified limb: Secondary | ICD-10-CM | POA: Insufficient documentation

## 2013-05-14 LAB — PROTIME-INR
INR: 3.3 (ref 2.00–3.50)
Protime: 39.6 Seconds — ABNORMAL HIGH (ref 10.6–13.4)

## 2013-05-14 LAB — POCT INR: INR: 3.3

## 2013-05-14 MED ORDER — CEPHALEXIN 500 MG PO CAPS: 500.0000 mg | ORAL_CAPSULE | Freq: Four times a day (QID) | ORAL | Status: DC

## 2013-05-14 NOTE — Patient Instructions (Signed)
Topotecan injection What is this medicine? TOPOTECAN (TOE poe TEE kan) is a chemotherapy drug. It is used to treat lung cancer, ovarian cancer, and cervical cancer. This medicine may be used for other purposes; ask your health care provider or pharmacist if you have questions. COMMON BRAND NAME(S): Hycamtin What should I tell my health care provider before I take this medicine? They need to know if you have any of these conditions: -blood disorders -dehydration -diarrhea -immune system problems -infection (especially a virus infection such as chickenpox, cold sores, or herpes) -kidney disease -low blood counts, like low white cell, platelet, or red cell counts -recent or ongoing radiation therapy -an unusual or allergic reaction to topotecan, other medicines, foods, dyes, or preservatives -pregnant or trying to get pregnant -breast-feeding How should I use this medicine? This medicine is for infusion into a vein. It is usually given by a health care professional in a hospital or clinic setting. In rare cases, you might get this medicine at home. You will be taught how to give this medicine. Use exactly as directed. Take your medicine at regular intervals. Do not take your medicine more often than directed. It is important that you put your used needles and syringes in a special sharps container. Do not put them in a trash can. If you do not have a sharps container, call your pharmacist or healthcare provider to get one. Talk to your pediatrician regarding the use of this medicine in children. Special care may be needed. Overdosage: If you think you have taken too much of this medicine contact a poison control center or emergency room at once. NOTE: This medicine is only for you. Do not share this medicine with others. What if I miss a dose? It is important not to miss your dose. Call your doctor or health care professional if you are unable to keep an appointment. What may interact with  this medicine? -amiodarone -antiviral medicines for HIV or AIDS -cisplatin -clarithromycin -cyclosporine -diltiazem -erythromycin -grapefruit or grapefruit juice -medicines for fungal infections like ketoconazole and itraconazole -mefloquine -mifepristone, RU-486 -nicardipine -phenytoin -propafenone -quinidine -tacrolimus -tamoxifen -testosterone -vaccines -verapamil Talk to your prescriber or health care professional before taking any of these medicines: -aspirin -acetaminophen -ibuprofen -naproxen -ketoprofen This list may not describe all possible interactions. Give your health care provider a list of all the medicines, herbs, non-prescription drugs, or dietary supplements you use. Also tell them if you smoke, drink alcohol, or use illegal drugs. Some items may interact with your medicine. What should I watch for while using this medicine? This drug may make you feel generally unwell. This is not uncommon, as chemotherapy can affect healthy cells as well as cancer cells. Report any side effects. Continue your course of treatment even though you feel ill unless your doctor tells you to stop. Call your doctor or health care professional for advice if you get a fever, chills or sore throat, or other symptoms of a cold or flu. Do not treat yourself. This drug decreases your body's ability to fight infections. Try to avoid being around people who are sick. This medicine may increase your risk to bruise or bleed. Call your doctor or health care professional if you notice any unusual bleeding. Be careful brushing and flossing your teeth or using a toothpick because you may get an infection or bleed more easily. If you have any dental work done, tell your dentist you are receiving this medicine. Avoid taking products that contain aspirin, acetaminophen, ibuprofen, naproxen, or  ketoprofen unless instructed by your doctor. These medicines may hide a fever. Do not become pregnant while  taking this medicine. Women should inform their doctor if they wish to become pregnant or think they might be pregnant. There is a potential for serious side effects to an unborn child. Talk to your health care professional or pharmacist for more information. Do not breast-feed an infant while taking this medicine. What side effects may I notice from receiving this medicine? Side effects that you should report to your doctor or health care professional as soon as possible: -allergic reactions like skin rash, itching or hives, swelling of the face, lips, or tongue -breathing difficulties -diarrhea -dizziness -fever or chills, sore throat -mouth sores or pain -pain, tingling, numbness in the hands or feet -unusual bleeding or bruising -unusually weak or tired -yellowing of the eyes or skin Side effects that usually do not require medical attention (report to your doctor or health care professional if they continue or are bothersome): -hair loss -headache -loss of appetite -nausea, vomiting -stomach pain This list may not describe all possible side effects. Call your doctor for medical advice about side effects. You may report side effects to FDA at 1-800-FDA-1088. Where should I keep my medicine? Keep out of the reach of children. This drug is usually given in a hospital or clinic and will not be stored at home. In rare cases, this medicine may be given at home. If you are using this medicine at home, you will be instructed on how to store this medicine. Throw away any unused medicine after the expiration date on the label. NOTE: This sheet is a summary. It may not cover all possible information. If you have questions about this medicine, talk to your doctor, pharmacist, or health care provider.  2014, [employer]. (2007-12-25 17:25:53)  Pemetrexed injection What is this medicine? PEMETREXED (PEM e TREX ed) is a chemotherapy drug. This medicine affects cells that are rapidly  growing, such as cancer cells and cells in your mouth and stomach. It is usually used to treat lung cancers like non-small cell lung cancer and mesothelioma. It may also be used to treat other cancers. This medicine may be used for other purposes; ask your health care provider or pharmacist if you have questions. COMMON BRAND NAME(S): Alimta What should I tell my health care provider before I take this medicine? They need to know if you have any of these conditions: -if you frequently drink alcohol containing beverages -infection (especially a virus infection such as chickenpox, cold sores, or herpes) -kidney disease -liver disease -low blood counts, like low platelets, red bloods, or white blood cells -an unusual or allergic reaction to pemetrexed, mannitol, other medicines, foods, dyes, or preservatives -pregnant or trying to get pregnant -breast-feeding How should I use this medicine? This drug is given as an infusion into a vein. It is administered in a hospital or clinic by a specially trained health care professional. Talk to your pediatrician regarding the use of this medicine in children. Special care may be needed. Overdosage: If you think you have taken too much of this medicine contact a poison control center or emergency room at once. NOTE: This medicine is only for you. Do not share this medicine with others. What if I miss a dose? It is important not to miss your dose. Call your doctor or health care professional if you are unable to keep an appointment. What may interact with this medicine? -aspirin and aspirin-like medicines -medicines to  increase blood counts like filgrastim, pegfilgrastim, sargramostim -methotrexate -NSAIDS, medicines for pain and inflammation, like ibuprofen or naproxen -probenecid -pyrimethamine -vaccines Talk to your doctor or health care professional before taking any of these  medicines: -acetaminophen -aspirin -ibuprofen -ketoprofen -naproxen This list may not describe all possible interactions. Give your health care provider a list of all the medicines, herbs, non-prescription drugs, or dietary supplements you use. Also tell them if you smoke, drink alcohol, or use illegal drugs. Some items may interact with your medicine. What should I watch for while using this medicine? Visit your doctor for checks on your progress. This drug may make you feel generally unwell. This is not uncommon, as chemotherapy can affect healthy cells as well as cancer cells. Report any side effects. Continue your course of treatment even though you feel ill unless your doctor tells you to stop. In some cases, you may be given additional medicines to help with side effects. Follow all directions for their use. Call your doctor or health care professional for advice if you get a fever, chills or sore throat, or other symptoms of a cold or flu. Do not treat yourself. This drug decreases your body's ability to fight infections. Try to avoid being around people who are sick. This medicine may increase your risk to bruise or bleed. Call your doctor or health care professional if you notice any unusual bleeding. Be careful brushing and flossing your teeth or using a toothpick because you may get an infection or bleed more easily. If you have any dental work done, tell your dentist you are receiving this medicine. Avoid taking products that contain aspirin, acetaminophen, ibuprofen, naproxen, or ketoprofen unless instructed by your doctor. These medicines may hide a fever. Call your doctor or health care professional if you get diarrhea or mouth sores. Do not treat yourself. To protect your kidneys, drink water or other fluids as directed while you are taking this medicine. Men and women must use effective birth control while taking this medicine. You may also need to continue using effective birth  control for a time after stopping this medicine. Do not become pregnant while taking this medicine. Tell your doctor right away if you think that you or your partner might be pregnant. There is a potential for serious side effects to an unborn child. Talk to your health care professional or pharmacist for more information. Do not breast-feed an infant while taking this medicine. This medicine may lower sperm counts. What side effects may I notice from receiving this medicine? Side effects that you should report to your doctor or health care professional as soon as possible: -allergic reactions like skin rash, itching or hives, swelling of the face, lips, or tongue -low blood counts - this medicine may decrease the number of white blood cells, red blood cells and platelets. You may be at increased risk for infections and bleeding. -signs of infection - fever or chills, cough, sore throat, pain or difficulty passing urine -signs of decreased platelets or bleeding - bruising, pinpoint red spots on the skin, black, tarry stools, blood in the urine -signs of decreased red blood cells - unusually weak or tired, fainting spells, lightheadedness -breathing problems, like a dry cough -changes in emotions or moods -chest pain -confusion -diarrhea -high blood pressure -mouth or throat sores or ulcers -pain, swelling, warmth in the leg -pain on swallowing -swelling of the ankles, feet, hands -trouble passing urine or change in the amount of urine -vomiting -yellowing of the eyes or  skin Side effects that usually do not require medical attention (report to your doctor or health care professional if they continue or are bothersome): -hair loss -loss of appetite -nausea -stomach upset This list may not describe all possible side effects. Call your doctor for medical advice about side effects. You may report side effects to FDA at 1-800-FDA-1088. Where should I keep my medicine? This drug is given in a  hospital or clinic and will not be stored at home. NOTE: This sheet is a summary. It may not cover all possible information. If you have questions about this medicine, talk to your doctor, pharmacist, or health care provider.  2014, [employer]. (2007-11-12 13:24:03)   Nanoparticle Albumin-Bound Paclitaxel injection What is this medicine? NANOPARTICLE ALBUMIN-BOUND PACLITAXEL (Na no PAHR ti kuhl al BYOO muhn-bound PAK li TAX el) is a chemotherapy drug. It targets fast dividing cells, like cancer cells, and causes these cells to die. This medicine is used to treat advanced breast cancer and advanced lung cancer. This medicine may be used for other purposes; ask your health care provider or pharmacist if you have questions. COMMON BRAND NAME(S): Abraxane What should I tell my health care provider before I take this medicine? They need to know if you have any of these conditions: -kidney disease -liver disease -low blood counts, like low platelets, red blood cells, or white blood cells -recent or ongoing radiation therapy -an unusual or allergic reaction to paclitaxel, albumin, other chemotherapy, other medicines, foods, dyes, or preservatives -pregnant or trying to get pregnant -breast-feeding How should I use this medicine? This drug is given as an infusion into a vein. It is administered in a hospital or clinic by a specially trained health care professional. Talk to your pediatrician regarding the use of this medicine in children. Special care may be needed. Overdosage: If you think you have taken too much of this medicine contact a poison control center or emergency room at once. NOTE: This medicine is only for you. Do not share this medicine with others. What if I miss a dose? It is important not to miss your dose. Call your doctor or health care professional if you are unable to keep an appointment. What may interact with this  medicine? -cyclosporine -diazepam -ketoconazole -medicines to increase blood counts like filgrastim, pegfilgrastim, sargramostim -other chemotherapy drugs like cisplatin, doxorubicin, epirubicin, etoposide, teniposide, vincristine -quinidine -testosterone -vaccines -verapamil Talk to your doctor or health care professional before taking any of these medicines: -acetaminophen -aspirin -ibuprofen -ketoprofen -naproxen This list may not describe all possible interactions. Give your health care provider a list of all the medicines, herbs, non-prescription drugs, or dietary supplements you use. Also tell them if you smoke, drink alcohol, or use illegal drugs. Some items may interact with your medicine. What should I watch for while using this medicine? Your condition will be monitored carefully while you are receiving this medicine. You will need important blood work done while you are taking this medicine. This drug may make you feel generally unwell. This is not uncommon, as chemotherapy can affect healthy cells as well as cancer cells. Report any side effects. Continue your course of treatment even though you feel ill unless your doctor tells you to stop. In some cases, you may be given additional medicines to help with side effects. Follow all directions for their use. Call your doctor or health care professional for advice if you get a fever, chills or sore throat, or other symptoms of a cold or flu.  Do not treat yourself. This drug decreases your body's ability to fight infections. Try to avoid being around people who are sick. This medicine may increase your risk to bruise or bleed. Call your doctor or health care professional if you notice any unusual bleeding. Be careful brushing and flossing your teeth or using a toothpick because you may get an infection or bleed more easily. If you have any dental work done, tell your dentist you are receiving this medicine. Avoid taking products that  contain aspirin, acetaminophen, ibuprofen, naproxen, or ketoprofen unless instructed by your doctor. These medicines may hide a fever. Do not become pregnant while taking this medicine. Women should inform their doctor if they wish to become pregnant or think they might be pregnant. There is a potential for serious side effects to an unborn child. Talk to your health care professional or pharmacist for more information. Do not breast-feed an infant while taking this medicine. Men are advised not to father a child while receiving this medicine. What side effects may I notice from receiving this medicine? Side effects that you should report to your doctor or health care professional as soon as possible: -allergic reactions like skin rash, itching or hives, swelling of the face, lips, or tongue -low blood counts - This drug may decrease the number of white blood cells, red blood cells and platelets. You may be at increased risk for infections and bleeding. -signs of infection - fever or chills, cough, sore throat, pain or difficulty passing urine -signs of decreased platelets or bleeding - bruising, pinpoint red spots on the skin, black, tarry stools, nosebleeds -signs of decreased red blood cells - unusually weak or tired, fainting spells, lightheadedness -breathing problems -changes in vision -chest pain -high or low blood pressure -mouth sores -nausea and vomiting -pain, swelling, redness or irritation at the injection site -pain, tingling, numbness in the hands or feet -slow or irregular heartbeat -swelling of the ankle, feet, hands Side effects that usually do not require medical attention (report to your doctor or health care professional if they continue or are bothersome): -aches, pains -changes in the color of fingernails -diarrhea -hair loss -loss of appetite This list may not describe all possible side effects. Call your doctor for medical advice about side effects. You may report  side effects to FDA at 1-800-FDA-1088. Where should I keep my medicine? This drug is given in a hospital or clinic and will not be stored at home. NOTE: This sheet is a summary. It may not cover all possible information. If you have questions about this medicine, talk to your doctor, pharmacist, or health care provider.  2014, [employer]. (2012-06-03 16:48:50)   Vinorelbine injection What is this medicine? VINORELBINE (vi NOR el been) is a chemotherapy drug. It targets fast dividing cells, like cancer cells, and causes these cells to die. This medicine is used to treat cancer, like lung cancer. This medicine may be used for other purposes; ask your health care provider or pharmacist if you have questions. COMMON BRAND NAME(S): Navelbine What should I tell my health care provider before I take this medicine? They need to know if you have any of these conditions: -blood disorders -infection (especially chickenpox and herpes) -liver disease -lung disease -nervous system disease -previous or current radiation therapy -an unusual or allergic reaction to vinorelbine, other chemotherapy agents, other medicines, foods, dyes, or preservatives -pregnant or trying to get pregnant -breast-feeding How should I use this medicine? This drug is given as an infusion  into a vein. It is administered in a hospital or clinic by a specially trained health care professional. If you have pain, swelling, burning or any unusual feeling around the site of your injection, tell your health care professional right away. Talk to your pediatrician regarding the use of this medicine in children. Special care may be needed. Overdosage: If you think you have taken too much of this medicine contact a poison control center or emergency room at once. NOTE: This medicine is only for you. Do not share this medicine with others. What if I miss a dose? It is important not to miss your dose. Call your doctor or  health care professional if you are unable to keep an appointment. What may interact with this medicine? Do not take this medicine with any of the following medications: -itraconazole -voriconazole This medicine may also interact with the following medications: -cyclosporine -erythromycin -fluconazole -ketoconazole -medicines for HIV like delavirdine, efavirenz, nevirapine -medicines for seizures like ethotoin, fosphenotoin, phenytoin -medicines to increase blood counts like filgrastim, pegfilgrastim, sargramostim -other chemotherapy drugs like cisplatin, mitomycin, paclitaxel -vaccines Talk to your doctor or health care professional before taking any of these medicines: -acetaminophen -aspirin -ibuprofen -ketoprofen -naproxen This list may not describe all possible interactions. Give your health care provider a list of all the medicines, herbs, non-prescription drugs, or dietary supplements you use. Also tell them if you smoke, drink alcohol, or use illegal drugs. Some items may interact with your medicine. What should I watch for while using this medicine? Your condition will be monitored carefully while you are receiving this medicine. You will need important blood work done while you are taking this medicine. This drug may make you feel generally unwell. This is not uncommon, as chemotherapy can affect healthy cells as well as cancer cells. Report any side effects. Continue your course of treatment even though you feel ill unless your doctor tells you to stop. In some cases, you may be given additional medicines to help with side effects. Follow all directions for their use. Call your doctor or health care professional for advice if you get a fever, chills or sore throat, or other symptoms of a cold or flu. Do not treat yourself. This drug decreases your body's ability to fight infections. Try to avoid being around people who are sick. This medicine may increase your risk to bruise or  bleed. Call your doctor or health care professional if you notice any unusual bleeding. Be careful brushing and flossing your teeth or using a toothpick because you may get an infection or bleed more easily. If you have any dental work done, tell your dentist you are receiving this medicine. Avoid taking products that contain aspirin, acetaminophen, ibuprofen, naproxen, or ketoprofen unless instructed by your doctor. These medicines may hide a fever. Do not become pregnant while taking this medicine. Women should inform their doctor if they wish to become pregnant or think they might be pregnant. There is a potential for serious side effects to an unborn child. Talk to your health care professional or pharmacist for more information. Do not breast-feed an infant while taking this medicine. What side effects may I notice from receiving this medicine? Side effects that you should report to your doctor or health care professional as soon as possible: -allergic reactions like skin rash, itching or hives, swelling of the face, lips, or tongue -low blood counts - This drug may decrease the number of white blood cells, red blood cells and platelets. You may  be at increased risk for infections and bleeding. -signs of infection - fever or chills, cough, sore throat, pain or difficulty passing urine -signs of decreased platelets or bleeding - bruising, pinpoint red spots on the skin, black, tarry stools, nosebleeds -signs of decreased red blood cells - unusually weak or tired, fainting spells, lightheadedness -breathing problems -chest pain -constipation -cough -mouth sores -nausea and vomiting -pain, swelling, redness or irritation at the injection site -pain, tingling, numbness in the hands or feet -stomach pain -trouble passing urine or change in the amount of urine Side effects that usually do not require medical attention (report to your doctor or health care professional if they continue or are  bothersome): -diarrhea -hair loss -jaw pain -loss of appetite This list may not describe all possible side effects. Call your doctor for medical advice about side effects. You may report side effects to FDA at 1-800-FDA-1088. Where should I keep my medicine? This drug is given in a hospital or clinic and will not be stored at home. NOTE: This sheet is a summary. It may not cover all possible information. If you have questions about this medicine, talk to your doctor, pharmacist, or health care provider.  2014, [employer]. (2008-01-06 17:18:15)    Docetaxel injection What is this medicine? DOCETAXEL (doe se TAX el) is a chemotherapy drug. It targets fast dividing cells, like cancer cells, and causes these cells to die. This medicine is used to treat many types of cancers like breast cancer, certain stomach cancers, head and neck cancer, lung cancer, and prostate cancer. This medicine may be used for other purposes; ask your health care provider or pharmacist if you have questions. COMMON BRAND NAME(S): Docefrez , Taxotere What should I tell my health care provider before I take this medicine? They need to know if you have any of these conditions: -infection (especially a virus infection such as chickenpox, cold sores, or herpes) -liver disease -low blood counts, like low white cell, platelet, or red cell counts -an unusual or allergic reaction to docetaxel, polysorbate 80, other chemotherapy agents, other medicines, foods, dyes, or preservatives -pregnant or trying to get pregnant -breast-feeding How should I use this medicine? This drug is given as an infusion into a vein. It is administered in a hospital or clinic by a specially trained health care professional. Talk to your pediatrician regarding the use of this medicine in children. Special care may be needed. Overdosage: If you think you have taken too much of this medicine contact a poison control center or emergency  room at once. NOTE: This medicine is only for you. Do not share this medicine with others. What if I miss a dose? It is important not to miss your dose. Call your doctor or health care professional if you are unable to keep an appointment. What may interact with this medicine? -cyclosporine -erythromycin -ketoconazole -medicines to increase blood counts like filgrastim, pegfilgrastim, sargramostim -vaccines Talk to your doctor or health care professional before taking any of these medicines: -acetaminophen -aspirin -ibuprofen -ketoprofen -naproxen This list may not describe all possible interactions. Give your health care provider a list of all the medicines, herbs, non-prescription drugs, or dietary supplements you use. Also tell them if you smoke, drink alcohol, or use illegal drugs. Some items may interact with your medicine. What should I watch for while using this medicine? Your condition will be monitored carefully while you are receiving this medicine. You will need important blood work done while you are taking this medicine.  This drug may make you feel generally unwell. This is not uncommon, as chemotherapy can affect healthy cells as well as cancer cells. Report any side effects. Continue your course of treatment even though you feel ill unless your doctor tells you to stop. In some cases, you may be given additional medicines to help with side effects. Follow all directions for their use. Call your doctor or health care professional for advice if you get a fever, chills or sore throat, or other symptoms of a cold or flu. Do not treat yourself. This drug decreases your body's ability to fight infections. Try to avoid being around people who are sick. This medicine may increase your risk to bruise or bleed. Call your doctor or health care professional if you notice any unusual bleeding. Be careful brushing and flossing your teeth or using a toothpick because you may get an infection  or bleed more easily. If you have any dental work done, tell your dentist you are receiving this medicine. Avoid taking products that contain aspirin, acetaminophen, ibuprofen, naproxen, or ketoprofen unless instructed by your doctor. These medicines may hide a fever. Do not become pregnant while taking this medicine. Women should inform their doctor if they wish to become pregnant or think they might be pregnant. There is a potential for serious side effects to an unborn child. Talk to your health care professional or pharmacist for more information. Do not breast-feed an infant while taking this medicine. What side effects may I notice from receiving this medicine? Side effects that you should report to your doctor or health care professional as soon as possible: -allergic reactions like skin rash, itching or hives, swelling of the face, lips, or tongue -low blood counts - This drug may decrease the number of white blood cells, red blood cells and platelets. You may be at increased risk for infections and bleeding. -signs of infection - fever or chills, cough, sore throat, pain or difficulty passing urine -signs of decreased platelets or bleeding - bruising, pinpoint red spots on the skin, black, tarry stools, nosebleeds -signs of decreased red blood cells - unusually weak or tired, fainting spells, lightheadedness -breathing problems -fast or irregular heartbeat -low blood pressure -mouth sores -nausea and vomiting -pain, swelling, redness or irritation at the injection site -pain, tingling, numbness in the hands or feet -swelling of the ankle, feet, hands -weight gain Side effects that usually do not require medical attention (report to your prescriber or health care professional if they continue or are bothersome): -bone pain -complete hair loss including hair on your head, underarms, pubic hair, eyebrows, and eyelashes -diarrhea -excessive tearing -changes in the color of  fingernails -loosening of the fingernails -nausea -muscle pain -red flush to skin -sweating -weak or tired This list may not describe all possible side effects. Call your doctor for medical advice about side effects. You may report side effects to FDA at 1-800-FDA-1088. Where should I keep my medicine? This drug is given in a hospital or clinic and will not be stored at home. NOTE: This sheet is a summary. It may not cover all possible information. If you have questions about this medicine, talk to your doctor, pharmacist, or health care provider.  2014, [employer] Standard. (2008-03-23 11:52:10)  Cyclophosphamide tablets What is this medicine? CYCLOPHOSPHAMIDE (sye kloe FOSS fa mide) is a chemotherapy drug. It slows the growth of cancer cells. This medicine is used to treat many types of cancer like lymphoma, myeloma, leukemia, breast cancer, and ovarian cancer, to  name a few. It is also used to treat nephrotic syndrome in children. This medicine may be used for other purposes; ask your health care provider or pharmacist if you have questions. COMMON BRAND NAME(S): Cytoxan What should I tell my health care provider before I take this medicine? They need to know if you have any of these conditions: -blood disorders -infection -kidney disease -liver disease -recent or ongoing radiation therapy -an unusual or allergic reaction to cyclophosphamide, other chemotherapy, other medicines, foods, dyes, or preservatives -pregnant or trying to get pregnant -breast-feeding How should I use this medicine? Take this medicine by mouth with a glass of water. Follow the directions on the prescription label. Do not cut, crush or chew this medicine. Take your medicine at regular intervals. Do not take it more often than directed. Do not stop taking except on your doctor's advice. Talk to your pediatrician regarding the use of this medicine in children. Special care may be needed. Overdosage: If you  think you have taken too much of this medicine contact a poison control center or emergency room at once. NOTE: This medicine is only for you. Do not share this medicine with others. What if I miss a dose? If you miss a dose, take it as soon as you can. If it is almost time for your next dose, take only that dose. Do not take double or extra doses. What may interact with this medicine? This medicine may interact with the following medications: -amiodarone -amphotericin B -azathioprine -certain antiviral medicines for HIV or AIDS such as protease inhibitors (e.g., indinavir, ritonavir) and zidovudine -certain blood pressure medications such as benazepril, captopril, enalapril, fosinopril, lisinopril, moexipril, monopril, perindopril, quinapril, ramipril, trandolapril -certain cancer medications such as anthracyclines (e.g., daunorubicin, doxorubicin), busulfan, cytarabine, paclitaxel, pentostatin, tamoxifen, trastuzumab -certain diuretics such as chlorothiazide, chlorthalidone, hydrochlorothiazide, indapamide, metolazone -certain medicines that treat or prevent blood clots like warfarin -certain muscle relaxants such as succinylcholine -cyclosporine -etanercept -indomethacin -medicines to increase blood counts like filgrastim, pegfilgrastim, sargramostim -medicines used as general anesthesia -metronidazole -natalizumab This list may not describe all possible interactions. Give your health care provider a list of all the medicines, herbs, non-prescription drugs, or dietary supplements you use. Also tell them if you smoke, drink alcohol, or use illegal drugs. Some items may interact with your medicine. What should I watch for while using this medicine? Visit your doctor for checks on your progress. This drug may make you feel generally unwell. This is not uncommon, as chemotherapy can affect healthy cells as well as cancer cells. Report any side effects. Continue your course of treatment even  though you feel ill unless your doctor tells you to stop. Drink water or other fluids as directed. Urinate often, even at night. In some cases, you may be given additional medicines to help with side effects. Follow all directions for their use. Call your doctor or health care professional for advice if you get a fever, chills or sore throat, or other symptoms of a cold or flu. Do not treat yourself. This drug decreases your body's ability to fight infections. Try to avoid being around people who are sick. This medicine may increase your risk to bruise or bleed. Call your doctor or health care professional if you notice any unusual bleeding. Be careful brushing and flossing your teeth or using a toothpick because you may get an infection or bleed more easily. If you have any dental work done, tell your dentist you are receiving this medicine. You may get  drowsy or dizzy. Do not drive, use machinery, or do anything that needs mental alertness until you know how this medicine affects you. Do not become pregnant while taking this medicine or for 1 year after stopping it. Women should inform their doctor if they wish to become pregnant or think they might be pregnant. Men should not father a child while taking this medicine and for 4 months after stopping it. There is a potential for serious side effects to an unborn child. Talk to your health care professional or pharmacist for more information. Do not breast-feed an infant while taking this medicine. This medicine may interfere with the ability to have a child. This medicine has caused ovarian failure in some women. This medicine has caused reduced sperm counts in some men. You should talk with your doctor or health care professional if you are concerned about your fertility. If you are going to have surgery, tell your doctor or health care professional that you have taken this medicine. What side effects may I notice from receiving this medicine? Side  effects that you should report to your doctor or health care professional as soon as possible: -allergic reactions like skin rash, itching or hives, swelling of the face, lips, or tongue -low blood counts - this medicine may decrease the number of white blood cells, red blood cells and platelets. You may be at increased risk for infections and bleeding. -signs of infection - fever or chills, cough, sore throat, pain or difficulty passing urine -signs of decreased platelets or bleeding - bruising, pinpoint red spots on the skin, black, tarry stools, blood in the urine -signs of decreased red blood cells - unusually weak or tired, fainting spells, lightheadedness -breathing problems -dark urine -dizziness -palpitations -swelling of the ankles, feet, hands -trouble passing urine or change in the amount of urine -weight gain -yellowing of the eyes or skin Side effects that usually do not require medical attention (report to your doctor or health care professional if they continue or are bothersome): -changes in nail or skin color -hair loss -missed menstrual periods -mouth sores -nausea, vomiting This list may not describe all possible side effects. Call your doctor for medical advice about side effects. You may report side effects to FDA at 1-800-FDA-1088. Where should I keep my medicine? Keep out of the reach of children. Store at room temperature at or below 25 degrees C (77 degrees F). This medicine can be stored at room temperatures of up to 30 degrees C (86 degrees F) for a short time. Protect from temperatures above 30 degrees C (86 degrees F). Throw away any unused medicine after the expiration date. NOTE: This sheet is a summary. It may not cover all possible information. If you have questions about this medicine, talk to your doctor, pharmacist, or health care provider.  2014, [employer] Standard. (2012-02-23 16:42:57)  Etoposide, VP-16 capsules What is this  medicine? ETOPOSIDE, VP-16 (e toe POE side) is a chemotherapy drug. It is used to treat small cell lung cancer and other cancers. This medicine may be used for other purposes; ask your health care provider or pharmacist if you have questions. COMMON BRAND NAME(S): VePesid What should I tell my health care provider before I take this medicine? They need to know if you have any of these conditions: -infection -kidney disease -low blood counts, like low white cell, platelet, or red cell counts -an unusual or allergic reaction to etoposide, other chemotherapeutic agents, other medicines, foods, dyes, or preservatives -pregnant or  trying to get pregnant -breast-feeding How should I use this medicine? Take this medicine by mouth with a glass of water. Follow the directions on the prescription label. Do not open, crush, or chew the capsules. It is advisable to wear gloves when handling this medicine. Take your medicine at regular intervals. Do not take it more often than directed. Do not stop taking except on your doctor's advice. Talk to your pediatrician regarding the use of this medicine in children. Special care may be needed. Overdosage: If you think you have taken too much of this medicine contact a poison control center or emergency room at once. NOTE: This medicine is only for you. Do not share this medicine with others. What if I miss a dose? If you miss a dose, take it as soon as you can. If it is almost time for your next dose, take only that dose. Do not take double or extra doses. What may interact with this medicine? -cyclosporine -medicines to increase blood counts like filgrastim, pegfilgrastim, sargramostim -vaccines This list may not describe all possible interactions. Give your health care provider a list of all the medicines, herbs, non-prescription drugs, or dietary supplements you use. Also tell them if you smoke, drink alcohol, or use illegal drugs. Some items may interact with  your medicine. What should I watch for while using this medicine? Visit your doctor for checks on your progress. This drug may make you feel generally unwell. This is not uncommon, as chemotherapy can affect healthy cells as well as cancer cells. Report any side effects. Continue your course of treatment even though you feel ill unless your doctor tells you to stop. In some cases, you may be given additional medicines to help with side effects. Follow all directions for their use. Call your doctor or health care professional for advice if you get a fever, chills or sore throat, or other symptoms of a cold or flu. Do not treat yourself. This drug decreases your body's ability to fight infections. Try to avoid being around people who are sick. This medicine may increase your risk to bruise or bleed. Call your doctor or health care professional if you notice any unusual bleeding. Be careful brushing and flossing your teeth or using a toothpick because you may get an infection or bleed more easily. If you have any dental work done, tell your dentist you are receiving this medicine. Avoid taking products that contain aspirin, acetaminophen, ibuprofen, naproxen, or ketoprofen unless instructed by your doctor. These medicines may hide a fever. Do not become pregnant while taking this medicine. Women should inform their doctor if they wish to become pregnant or think they might be pregnant. There is a potential for serious side effects to an unborn child. Talk to your health care professional or pharmacist for more information. Do not breast-feed an infant while taking this medicine. What side effects may I notice from receiving this medicine? Side effects that you should report to your doctor or health care professional as soon as possible: -allergic reactions like skin rash, itching or hives, swelling of the face, lips, or tongue -low blood counts - this medicine may decrease the number of white blood cells,  red blood cells and platelets. You may be at increased risk for infections and bleeding. -signs of infection - fever or chills, cough, sore throat, pain or difficulty passing urine -signs of decreased platelets or bleeding - bruising, pinpoint red spots on the skin, black, tarry stools, blood in the urine -signs  of decreased red blood cells - unusually weak or tired, fainting spells, lightheadedness -breathing problems -changes in vision -mouth or throat sores or ulcers -pain, tingling, numbness in the hands or feet -redness, blistering, peeling or loosening of the skin, including inside the mouth -seizures -vomiting Side effects that usually do not require medical attention (report to your doctor or health care professional if they continue or are bothersome): -change in taste -diarrhea -hair loss -nausea -stomach pain This list may not describe all possible side effects. Call your doctor for medical advice about side effects. You may report side effects to FDA at 1-800-FDA-1088. Where should I keep my medicine? Keep out of the reach of children. Store in a refrigerator between 2 and 8 degrees C (36 and 46 degrees F). Do not freeze. Throw away any unused medicine after the expiration date. NOTE: This sheet is a summary. It may not cover all possible information. If you have questions about this medicine, talk to your doctor, pharmacist, or health care provider.  2014, [employer]. (2007-08-12 17:22:30)

## 2013-05-14 NOTE — Telephone Encounter (Signed)
per 1/20 pof cxd all flush appts picc is out. pt already arrived for lb today. lmonvm for pt confirming cancellation of flush appts and next appt for lb/LL 2/9

## 2013-05-14 NOTE — Progress Notes (Signed)
Consult Note: Gyn-Onc  Monique [name] 68 y.o. female  CC:  Chief Complaint  Patient presents with  . Ovarian Cancer    Follow up     HPI: ONCOLOGIC HISTORY   Patient developed constipation with some right abdominal pain in May 2013. She saw GI physician in ~ Aug 2013, but was unable to tolerate prep for colonoscopy. She developed LLE superficial phlebitis in Jan 2014 , followed by swelling and discomfort LLE with venous doppler at [employer] [facility] in [facility] on 05-21-12 with acute thrombus left common femoral vein. She had CT AP at [place] in [facility] 05-29-2012, reportedly with extensive ascites thru abdomen and pelvis, small right and minimal left pleural effusions, focal PE RLL pulmonary artery, mass near dome of liver 1.6 x 1.4 cm without other focal liver lesions, mass in omentum abutting dome of liver 3.4 x 2.3 cm, slightly nodular contour of liver, no hydronephrosis, complex cystic and solid mass 12.8 x 6.6 cm in bilateral pelvis not involving sidewalls, extensive pelvic DVT with probable chronic thrombus in IVC, no bowel obstruction, multiple small retroperitoneal nodes, left inguinal node 1.8 x 1.6 cm and right inguinal node 2.2 x 1.5 cm. She was begun on lovenox and transitioned to Xarelto by Dr Coletta Memos. CA 125 was 569. She saw me on 06-12-12, with distended abdomen and 15 cm fixed pelvic mass. She had US paracentesis by IR 06-12-12 for 2.8 liters, cytology ( NZB14 - 126) adenocarcinoma. Neoadjuvant dose dense carbo/taxol was beun 06-21-12 with 3 cycles given thru 08-16-12. She needed neupogen days 2,8 and 16 on this regimen. She had good partial response by CTs 08-19-12 and had improvement in CA 125 from 1041 in Feb 2014 down to 92 on 08-21-12, after 3 cycles dose dense chemo.   She had IVC filter placed prior to exploratory laparotomy with extensive lysis of adhesions, left ureteral lysis, and bilateral salpingo-oophorectomy on 09-10-12, suboptimal debulking. Operative findings:diffuse  carcinomatosis of the residual omentum involving the entire transverse colon. Transverse colon densely adherent to the anterior abdominal wall. No disease throughout the small bowel. Mesenteric adenopathy with lymph nodes measuring 1 cm, involving the root of the small bowel mesentery. ~5 centimeter right ovarian mass and ~ 6 cm left ovarian mass. Small bowel densely adherent to the pelvis. 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 Chemotherapy resumed with dose dense taxol carboplatin on 09-27-12, completing cycle 5 on 11-08-12. CA 125 increased from 92 in April to 130 post op in June, then 225 late July and repeat 318. CT CAP done 11-19-12 showed increase in nodular thickening along falciform ligament and small peritoneal nodule right iliac fossa. Plan was to change in chemotherapy to doxil with avastin, however echocardiogram 11-27-12 unexpectedly had EF of 15%. 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 She was hospitalized 8-13 thru 12-11-12, had some bleeding into the embolic CVA on [id number], and treated with heparin transitioned to coumadin. She was stable to resume gemzar with cycle 2 at 800 mg/m2 given on 12-30-12,then platelets down to 70k by day 8. She had progressive superficial thrombophlebitis LE on coumadin by 12-25-12 and was changed to full dose  lovenox then; there was difficulty getting lovenox covered by insurance. 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 Gemzar has been tolerated well at 500 mg/m2, without significant thrombocytopenia.  Interval History:   CA 125 has steadily been increasing most recently was 3049. Should a CT scan on January 20 that revealed:  FINDINGS:  Innumerable small hypovascular masses are seen throughout the right and left hepatic lobes which are new since previous study, consistent with diffuse liver metastases. The pancreas, spleen, adrenal glands, and kidneys are normal in appearance. No evidence hydronephrosis. Mild ascites is increased since previous study. Increased size of soft tissue mass seen in the central pelvis pre disease involving the uterus or hysterectomy bed) which currently measures 5.5 x 5.4 cm on image 67, consistent with metastatic disease. There is also increased bilateral external iliac lymphadenopathy and mild retroperitoneal lymphadenopathy in the left paraaortic region, consistent with metastatic disease. Mild lymphadenopathy in the central small bowel mesentery remains stable. No suspicious bone lesions identified. IVC filter remains in place. Tiny right pleural effusion also seen.   IMPRESSION:  Diffuse liver metastases, new since previous study. Increased size of central pelvic soft tissue mass, consistent with metastatic disease. Increased retroperitoneal and bilateral pelvic lymphadenopathy, consistent with metastatic disease. Stable mild mesenteric lymphadenopathy. Increased mild ascites and small right pleural effusion.  Review of Systems: She is overall feeling quite well. She did have her PICC line removed and is notice some pain in her right arm. She is anxious about her CT scan results. She is fearful that she has progressive disease as her CA 125 had been rising. She comes alone to clinic today.  Constitutional:  Denies fever. Skin: No rash, sores, jaundice, itching, or dryness.  Cardiovascular: No chest pain, shortness of  breath, or edema  Pulmonary: No cough or wheeze.  Gastro Intestinal:  No nausea, vomiting, constipation, or diarrhea reported. No bright red blood per rectum or change in bowel movement.  Genitourinary: No frequency, urgency, or dysuria.  Denies vaginal bleeding and discharge.  Musculoskeletal: + pain right arm above elbow Neurologic: No weakness, numbness, or change in gait.  Psychology: No acute changes   Current Meds:  Outpatient Encounter Prescriptions as of 05/14/2013  Medication Sig  . aspirin 81 MG tablet Take 81 mg by mouth daily.   Marland Kitchen lisinopril (PRINIVIL,ZESTRIL) 2.5 MG tablet Take 1 tablet (2.5 mg total) by mouth daily.  . metoprolol succinate (TOPROL-XL) 25 MG 24 hr tablet Take 0.5 tablets (12.5 mg total) by mouth 3 (three) times daily.  . pantoprazole (PROTONIX) 40 MG tablet Take 1 tablet (40 mg total) by mouth daily.  . polyethylene glycol (MIRALAX / GLYCOLAX) packet Take 17 g by mouth daily as needed (for constipation).   . sennosides-docusate sodium (SENOKOT-S) 8.6-50 MG tablet Take 1-2 tablets by mouth daily as needed for constipation.  Marland Kitchen spironolactone (ALDACTONE) 25 MG tablet Take 1 tablet (25 mg total) by mouth daily.  Marland Kitchen warfarin (COUMADIN) 5 MG tablet Take 1 tablet (5 mg total) by mouth daily.    Allergy:  Allergies  Allergen Reactions  . Septra [Sulfamethoxazole-Tmp Ds] Rash and Other (See Comments)    High fever, Burning skin  . Ampicillin Itching  . Fentanyl And Related Hives    Per Dr. Marcell Barlow, pt reports she is ok with fentanyl (09/10/12).  Patient states it was epidural form of Fentanyl that she reacted to.   . Neosporin [Neomycin-Bacitracin Zn-Polymyx] Swelling and Other (See Comments)    opthalmic solution only can use topically. Swelling and inflamed eyes    Social Hx:   History   Social History  . Marital Status: Single    Spouse Name: N/A    Number of Children: N/A  . Years of Education: N/A   Occupational History  . Not on file.   Social  History Main Topics  . Smoking status: Former Smoker -- 30 years    Types: Cigarettes    Quit date: 04/20/2012  . Smokeless tobacco: Never Used  . Alcohol Use: Yes     Comment: 01/21/2013 "long time ago I was a social drinker"  . Drug Use: No  . Sexual Activity: Not Currently   Other Topics Concern  . Not on file   Social History Narrative  . No narrative on file    Past Surgical Hx:  Past Surgical History  Procedure Laterality Date  . Appendectomy  1966    ruptured, hospital for 3 weeks  . Rhinoplasty  1976  . Vein ligation and stripping Right 1998    leg  . Mohs surgery  2004, 2005    basal cell of the face  . Tooth extraction  03/2011?    "just one; had a root canal that got infected" (01/21/2013)  . Laparotomy N/A 09/10/2012    Procedure: EXPLORATORY LAPAROTOMY, BILATERAL SALPINGO OOPHORECTOMY, TUMOR DEBULKING;  Surgeon: Imagene Gurney A. Alycia Rossetti, MD;  Location: [facility];  Service: Gynecology;  Laterality: N/A;  . Exploratory laparotomy  09/10/12    Lysis of adhesions, BSO, suboptimal tumor debulking  . Vena cava filter placement  08/2012    Past Medical Hx:  Past Medical History  Diagnosis Date  . Acute venous embolism and thrombosis of unspecified deep vessels of lower extremity   . Bronchitis     several times  . Abdominal or pelvic swelling, mass or lump, unspecified site   . Phlebitis and thrombophlebitis of unspecified site   . Pelvic mass   . GERD (gastroesophageal reflux disease)   . PONV (postoperative nausea and vomiting)     "when fentanyl is used, will break out in itchy rast"  . Basal cell carcinoma of face   . Ovarian cancer   . CHF (congestive heart failure)     "just since 11/2012; they think it's from the chemo" (01/21/2013)  . 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 DVT (deep venous thrombosis) 04/2012    "BLE" (01/21/2013)  . Chronic bronchitis     "I had it several times; I used to smoke" (01/21/2013)  .  Shortness of breath     "trouble taking a deep breath at times before RX started; I'm fine now" (01/21/2013)  . Anemia     "because of the chemo" (01/21/2013)  . Arthritis     "little in my right hand" (01/21/2013)  . Anxiety     "related to ovarian cancer; don't take RX for it" (01/21/2013)    Oncology Hx:    Ovarian cancer   06/12/2012 Initial Diagnosis Ovarian cancer   06/21/2012 - 08/16/2012 Chemotherapy dose dense paclitaxel and carboplatin   09/10/2012 Surgery suboptimal debulking    Chemotherapy liposomal dox planned but EF 15%   12/02/2012 -  Chemotherapy gemcitabine    Family Hx:  Family History  Problem Relation Age of  Onset  . Pneumonia Mother   . Heart attack Father     Vitals:  Blood pressure 128/67, pulse 82, temperature 98.6 F (37 C), temperature source Oral, resp. rate 18, height 5\' 7"  (1.702 m), weight 152 lb 12.8 oz (69.31 kg).  Physical Exam: Well-nourished well-developed female in no acute distress.  Right upper extremity evidence of phlebitis with hard of and tubal vein. There is also a 5 cm warm slightly raised indurated area just above with the prior PICC line site was. There is no fluctuance. The area was marked with a pen.  Assessment/Plan: 68 year old with progressive ovarian carcinoma. Greater than 30 minutes face to face time was done with patient reviewing her CT scan results. We reviewed the report as well as showed her the images of the liver metastases on the computer screen. She is understandably upset took the news very well. She states that she felt that this was occurring as her CA 125 was rising. We discussed several options. We discussed both intravenous options including topotecan, Abraxane, Taxotere, navelbine, and Alimta. We also discussed that options including Cytoxan and etoposide. She was given printed information regarding all of these medications. She states that she would like to continue with chemotherapy if the options that may be helpful  to her. She has an appointment with Dr. Marko Plume next week. I will notify Dr. Marko Plume of the CT scan findings if she can be aware of. The patient will review the chemotherapy options that were provided to her. I discussed with her that this was mixed not exhaustive is merely a list of the agents that I typically used most often in this setting.  Her questions were answered to her satisfaction. A prescription was sent to her pharmacy for on Keflex. The area on her right upper tremulous marked. She was encouraged not to wash off the pen marking and will call us should the erythema extend outside of this area. She states her arm looks better today than it did yesterday.  Nancy Marus A., MD 05/14/2013, 12:56 PM

## 2013-05-14 NOTE — Telephone Encounter (Signed)
Message copied by Baruch Merl on Wed May 14, 2013  5:38 PM ------      Message from: Gordy Levan      Created: Wed May 14, 2013  2:37 PM       If you have a chance, please let patient know that I saw CT and Dr Elenora Gamma note, & I can see her Friday 1-23 to check her arm, check coumadin and figure out treatment plan.       I have sent POF to scheduler now for 1-23 using new pt spot. (if not 1-23, I can see her 1-28 last of day)      She will need chemo teaching by RN day of my visit.      thanks ------

## 2013-05-14 NOTE — Telephone Encounter (Signed)
Spoke with Ms. Ziemba  Regarding information noted below by Dr. Marko Plume and she is pleased to have an appointment on 05-16-13 at 3:15 for lab and 3:30 for visit.

## 2013-05-14 NOTE — Progress Notes (Signed)
INR = 3.3    Goal 2-3 Patient seen in Coumadin clinic yesterday.   At that time INR = 4.5 Patient held Coumadin dose last night and ASA 81mg  this morning. She ate a large salad last night. Her arm remains swollen and erythematous where PIC line was removed last week. She has some bruising on her arm from 1 week ago, but no new bruising. She states that when she blows her nose in the winter there is sometimes a small amount of blood on the tissue, but nothing significant. She is concerned about her INR falling too low when her Coumadin is held, she is afraid her phlebitis will return. We will resume Coumadin 5 mg today. She can resume her ASA 81 mg tomorrow am. She has an appointment 05/19/13 with Dr. Marko Plume, we will see her after her MD appt.  Theone Murdoch, PharmD

## 2013-05-15 ENCOUNTER — Encounter: Payer: Self-pay | Admitting: Oncology

## 2013-05-15 NOTE — Progress Notes (Unsigned)
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

## 2013-05-16 ENCOUNTER — Ambulatory Visit (HOSPITAL_BASED_OUTPATIENT_CLINIC_OR_DEPARTMENT_OTHER): Payer: Medicare Other | Admitting: Pharmacist

## 2013-05-16 ENCOUNTER — Ambulatory Visit (HOSPITAL_BASED_OUTPATIENT_CLINIC_OR_DEPARTMENT_OTHER): Payer: Medicare Other | Admitting: Oncology

## 2013-05-16 ENCOUNTER — Encounter: Payer: Self-pay | Admitting: Oncology

## 2013-05-16 ENCOUNTER — Other Ambulatory Visit (HOSPITAL_BASED_OUTPATIENT_CLINIC_OR_DEPARTMENT_OTHER): Payer: Medicare Other

## 2013-05-16 VITALS — BP 121/77 | HR 83 | Temp 97.4°F | Resp 18 | Ht 67.0 in | Wt 153.0 lb

## 2013-05-16 DIAGNOSIS — C569 Malignant neoplasm of unspecified ovary: Secondary | ICD-10-CM

## 2013-05-16 DIAGNOSIS — C787 Secondary malignant neoplasm of liver and intrahepatic bile duct: Secondary | ICD-10-CM

## 2013-05-16 DIAGNOSIS — D649 Anemia, unspecified: Secondary | ICD-10-CM

## 2013-05-16 DIAGNOSIS — I8000 Phlebitis and thrombophlebitis of superficial vessels of unspecified lower extremity: Secondary | ICD-10-CM

## 2013-05-16 DIAGNOSIS — Z8673 Personal history of transient ischemic attack (TIA), and cerebral infarction without residual deficits: Secondary | ICD-10-CM

## 2013-05-16 DIAGNOSIS — Z86718 Personal history of other venous thrombosis and embolism: Secondary | ICD-10-CM

## 2013-05-16 DIAGNOSIS — I808 Phlebitis and thrombophlebitis of other sites: Secondary | ICD-10-CM

## 2013-05-16 DIAGNOSIS — I8003 Phlebitis and thrombophlebitis of superficial vessels of lower extremities, bilateral: Secondary | ICD-10-CM

## 2013-05-16 LAB — PROTIME-INR
INR: 2.6 (ref 2.00–3.50)
PROTIME: 31.2 s — AB (ref 10.6–13.4)

## 2013-05-16 LAB — POCT INR: INR: 2.6

## 2013-05-16 MED ORDER — FOLIC ACID 0.8 MG PO CAPS: 1.0000 | ORAL_CAPSULE | Freq: Every day | ORAL | Status: DC

## 2013-05-16 MED ORDER — DEXAMETHASONE 4 MG PO TABS: ORAL_TABLET | ORAL | Status: DC

## 2013-05-16 MED ORDER — CYANOCOBALAMIN 1000 MCG/ML IJ SOLN
1000.0000 ug | Freq: Once | INTRAMUSCULAR | Status: AC
  Administered 2013-05-16: 1000 ug via INTRAMUSCULAR

## 2013-05-16 NOTE — Progress Notes (Signed)
OFFICE PROGRESS NOTE   05/16/2013   P.Clayton Bibles, MD ([facility]P [facility])/ [place], Daneen Schick, Antony Contras  INTERVAL HISTORY:  Patient is seen, alone for visit, to discuss further treatment of progressive ovarian cancer. She had repeat CT AP on 05-13-13 and saw Dr Alycia Rossetti on 05-14-13. The CT unfortunately shows new tiny liver mets diffusely, small ascites and mild external iliac and retroperitoneal adenopathy; the central pelvic mass mentioned in CT report is uterus, which was not removed. Dr Alycia Rossetti mentioned a number of chemotherapy options, some of which would require another central line and some of which might have more thrombocytopenia than others. 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 She continues coumadin and has also been on baby ASA, and has been on Keflex since 05-14-13 with improvement in RUE problem in past 24 hrs. Situation is also complicated by low EF. She has IVC filter in. PICC was in from Sept 2014 until removed 05-05-13. The swelling in RUE began  05-13-13.   ONCOLOGIC HISTORY   Ovarian cancer   06/12/2012 Initial Diagnosis Ovarian cancer   06/21/2012 - 08/16/2012 Chemotherapy dose dense paclitaxel and carboplatin   09/10/2012 Surgery suboptimal debulking    Chemotherapy liposomal dox planned but EF 15%   12/02/2012 -  Chemotherapy gemcitabine  Patient developed constipation with some right abdominal pain in May 2013. She saw GI physician in ~ Aug 2013, but was unable to tolerate prep for colonoscopy. She developed LLE superficial phlebitis in Jan 2014 , followed by swelling and discomfort LLE with venous doppler at [facility] in [facility] on 05-21-12 with acute thrombus left common femoral vein. 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 She was begun on lovenox and transitioned to Xarelto by Dr Coletta Memos. CA 125 was 569. She saw Dr Alycia Rossetti on 06-12-12, with distended abdomen and 15 cm fixed pelvic mass. She had US paracentesis by IR 06-12-12 for 2.8 liters, cytology ( NZB14 - 126) adenocarcinoma. Neoadjuvant dose dense carbo/taxol was beun 06-21-12 with 3 cycles given thru 08-16-12. She needed neupogen days 2,8 and 16 on this regimen. She had good partial response by CTs 08-19-12 and had improvement in CA 125 from 1041 in Feb 2014 down to 92 on 08-21-12, after 3 cycles dose dense chemo. She had IVC filter placed prior to exploratory laparotomy with extensive lysis of adhesions, left ureteral lysis, and bilateral salpingo-oophorectomy by Dr Alycia Rossetti on 09-10-12, suboptimal debulking. Operative findings:diffuse carcinomatosis of the residual omentum involving the entire transverse colon. Transverse colon densely adherent to the anterior abdominal wall. No disease throughout the small bowel. Mesenteric adenopathy with lymph nodes measuring 1 cm, involving the root of the small bowel mesentery. ~5 centimeter right ovarian mass and ~ 6 cm left ovarian mass. Small bowel densely adherent  to the pelvis. 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 Chemotherapy resumed with dose dense taxol carboplatin on 09-27-12, completing cycle 5 on 11-08-12. CA 125 increased from 92 in April to 130 post op in June, then 225 late July and repeat 318. CT CAP done 11-19-12 showed increase in nodular thickening along falciform ligament and small peritoneal nodule right iliac fossa. Plan was to change in chemotherapy to doxil with avastin, however echocardiogram 11-27-12 unexpectedly had EF of 15%. She received one cycle of gemzar on 12-02-12, given peripherally, then had embolic CVAs on 5-40-08 while still on xarelto. She was hospitalized 8-13 thru 12-11-12, had some bleeding into the embolic CVA on 6-76-19, and treated with heparin transitioned to coumadin. She was stable to resume gemzar with cycle 2 at 800 mg/m2 given on 12-30-12,then platelets down to 70k by day 8. She had progressive superficial thrombophlebitis LE on coumadin by 12-25-12 and was changed to full dose lovenox then; there was difficulty getting lovenox covered by insurance. Cycle 3 gemzar 01-13-13 was dose reduced to 600 mg/m2, however platelets still dropped to 90k; she had recurrent embolic CVAs on 08-31-30 and was hospitalized thru 01-28-13, treated with continuous heparin transitioned back to coumadin, plus baby ASA daily. Gemzar was tolerated well at 500 mg/m2, without significant thrombocytopenia, given from 12-02-2012 thru 04-28-2013 (10 cycles). CT 05-13-13 found new liver involvement with some adenopathy and ascites. CA 125 did not improve significantly during gemzar and had increased to 3050 in early Jan 2015.   Review of systems as above, also: No bleeding. Less erythema/ swelling/ tenderness RUE in past 24 hrs. No fever or shaking chills. Appetite at baseline, no nausea or vomiting. Slight incease in pedal edema, more salt in food since she is not doing much  cooking. No increase in SOB. Bowels ok. No new or different neurologic symptoms. Slight scaly and puritic skin areas on legs which she scratches at times. Not very active at home, but tolerates this without cardiac symptoms. No new areas of thrombophlebitis LE. Remainder of 10 point Review of Systems negative.  Objective:  Vital signs in last 24 hours:  BP 121/77  Pulse 83  Temp(Src) 97.4 F (36.3 C) (Oral)  Resp 18  Ht 5\' 7"  (1.702 m)  Wt 153 lb (69.4 kg)  BMI 23.96 kg/m2  Alert, oriented and appropriate. Speech mostly fluent. Ambulatory without difficulty.  Alopecia, wearing wig.  HEENT:PERRL, sclerae not icteric. Oral mucosa moist without lesions, posterior pharynx clear.  Neck supple. No JVD.  Lymphatics:no cervical,suraclavicular adenopathy Resp: clear to auscultation bilaterally and normal percussion to bases bilaterally Cardio: regular rate and rhythm. No gallop. GI: soft, nontender, not obviously distended, no mass or organomegaly. Normally active bowel sounds. Surgical incision not remarkable. Musculoskeletal/ Extremities: LE without tender cords or obvious areas of new thrombophlebitis, extensive superficial varicosities. 1+ pedal edema bilaterally, new. Right upper arm with area of swelling ~ 6 cm diameter, just outside of marked area inferiorly but less erythematous and less tender overall today. Slight cord palpable inferiorly and proximally at that area. Not hot. No swelling of arm otherwise. Previous insertion site for PICC was several cm medial to this erythema, closed and not remarkable. Skin excoriations healed. Neuro: Speech as above, Otherwise  nonfocal Skin without rash, ecchymosis, petechiae otherwise   Lab Results:  Results for orders placed in visit on 05/16/13  PROTIME-INR      Result Value Range   Protime 31.2 (*) 10.6 - 13.4 Seconds   INR 2.60  2.00 - 3.50   Lovenox No      INR is lower than I would have expected after missing only one dose of coumadin  and continuing 5 mg (+Keflex). Discussed with [facility] pharmacist, continue 5 mg daily and will recheck next week.  CBC 05-13-13 with WBC 7.4, ANC 5.3, Hgb 9.6 and plt 223k  Studies/Results:  CT ABDOMEN AND PELVIS WITH CONTRAST 05-13-13 COMPARISON: 11/19/2012  FINDINGS:  Innumerable small hypovascular masses are seen throughout the right  and left hepatic lobes which are new since previous study,  consistent with diffuse liver metastases.  The pancreas, spleen, adrenal glands, and kidneys are normal in  appearance. No evidence hydronephrosis.  Mild ascites is increased since previous study. Increased size of  soft tissue mass seen in the central pelvis pre disease involving  the uterus or hysterectomy bed) which currently measures 5.5 x 5.4  cm on image 67, consistent with metastatic disease. There is also  increased bilateral external iliac lymphadenopathy and mild  retroperitoneal lymphadenopathy in the left paraaortic region,  consistent with metastatic disease. Mild lymphadenopathy in the  central small bowel mesentery remains stable. No suspicious bone  lesions identified. IVC filter remains in place. Tiny right pleural  effusion also seen.  IMPRESSION:  Diffuse liver metastases, new since previous study.  Increased size of central pelvic soft tissue mass, consistent with  metastatic disease.  Increased retroperitoneal and bilateral pelvic lymphadenopathy,  consistent with metastatic disease. Stable mild mesenteric  lymphadenopathy.  Increased mild ascites and small right pleural effusion.  Medications: I have reviewed the patient's current medications. She will continue Keflex 500 mg qid as begun by Dr Alycia Rossetti. Discussed coumadin with [facility] pharmacist: continue 5 mg daily, recheck with treatment next week. B12 injection done today in anticipation of beginning Alimta next week. She will have decadron prescription for 4 mg bid x 3 days beginning day prior to alimta, and daily folic  acid. She will begin Hibiclens body wash 1-2x weekly in attempt to decrease skin related infections  DISCUSSION: CT information reviewed with patient and operative note from 09-10-12 also reviewed at her request. She does want to try additional chemotherapy in attempt to control disease longer; she is well aware that this is not potentially curable. We have considered possible chemotherapeutic agents: topotecan, abraxane, taxotere, navelbine, alimta, oral cytoxan, oral VP16.  As her peripheral veins are good as long as agent is not vesicant or significant irritant, I would prefer not to have another central line for now. I have recommended either alimta ~ every 3 weeks or oral VP16 now. She is in agreement with alimta, teaching done by RN now including use of [id number], folic acid and decadron. We will begin treatment with alimta in ~ one week.Preauthorization staff aware, no problems anticipated with alimta as it is approved for this indication.  She has had full teaching for alimta by RN separately from conversation with MD today.   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 Will change to alimta in continuing attempt to control disease. First B12 for alimta given today. Chemotherapy orders for alimta entered for next week. 2.recurrent superficial thrombophlebitis: LE  better presently and area on RUE improving. Continue coumadin with baby ASA and complete oral Keflex for RUE probable thrombophlebitis. If cannot control with coumadin/ASA would try SQ heparin. Add Hibiclens body wash 1-2x weekly. 3.multiple embolic CVAs Aug/Oct 8466. Some subsequent bleeding into the CVAs. She is minimally symptomatic including speech now. Known to neurology.  4.IVC filter in due to DVT early in cancer course  5.Ejection fraction 15% by echo 11-27-12. Dr Daneen Schick following.   5.PICC used for gemzar, now removed.  6.long past tobacco  7.flu vaccine done  8.anemia: multifactorial, not more symptomatic. Follow  9.Advanced directives done, friend Clair Gulling is HCPOA    Patient is in agreement with plan and has had all questions answered to her satisfaction. She expresses appreciation for care at this office and by gyn oncology. Time spent 40+ min including >50% counseling and coordination of care.   Gordy Levan, MD   05/16/2013, 5:32 PM

## 2013-05-16 NOTE — Progress Notes (Signed)
INR at goal  Pt dosed today by Dr. Marko Plume. Pt will continue same dose of 5 mg daily. Pt has started Keflex 500mg  4 times a day. This may interact and cause a small increase in her INR, we will watch this closely.  Pt will be set up for chemo with Alimta next Thursday or Friday. She will have labs and coumadin clinic will be scheduled and we will see Ms. Quintin in the treatment area

## 2013-05-16 NOTE — Patient Instructions (Signed)
INR at goal  Continue 5 mg daily.  Recheck INR on 05/23/13 with appointments and infusion we will see you in infusion

## 2013-05-19 ENCOUNTER — Telehealth: Payer: Self-pay | Admitting: Oncology

## 2013-05-21 ENCOUNTER — Ambulatory Visit: Payer: Medicare Other | Admitting: Oncology

## 2013-05-22 ENCOUNTER — Ambulatory Visit (HOSPITAL_BASED_OUTPATIENT_CLINIC_OR_DEPARTMENT_OTHER): Payer: Medicare Other

## 2013-05-22 ENCOUNTER — Ambulatory Visit (HOSPITAL_BASED_OUTPATIENT_CLINIC_OR_DEPARTMENT_OTHER): Payer: Medicare Other | Admitting: Pharmacist

## 2013-05-22 VITALS — BP 127/62 | HR 72 | Temp 97.4°F | Resp 16

## 2013-05-22 DIAGNOSIS — I8003 Phlebitis and thrombophlebitis of superficial vessels of lower extremities, bilateral: Secondary | ICD-10-CM

## 2013-05-22 DIAGNOSIS — C569 Malignant neoplasm of unspecified ovary: Secondary | ICD-10-CM

## 2013-05-22 DIAGNOSIS — Z5111 Encounter for antineoplastic chemotherapy: Secondary | ICD-10-CM

## 2013-05-22 DIAGNOSIS — Z86718 Personal history of other venous thrombosis and embolism: Secondary | ICD-10-CM

## 2013-05-22 DIAGNOSIS — I808 Phlebitis and thrombophlebitis of other sites: Secondary | ICD-10-CM

## 2013-05-22 DIAGNOSIS — I8000 Phlebitis and thrombophlebitis of superficial vessels of unspecified lower extremity: Secondary | ICD-10-CM

## 2013-05-22 DIAGNOSIS — I6350 Cerebral infarction due to unspecified occlusion or stenosis of unspecified cerebral artery: Secondary | ICD-10-CM

## 2013-05-22 LAB — CBC WITH DIFFERENTIAL/PLATELET
BASO%: 0.1 % (ref 0.0–2.0)
BASOS ABS: 0 10*3/uL (ref 0.0–0.1)
EOS ABS: 0 10*3/uL (ref 0.0–0.5)
EOS%: 0 % (ref 0.0–7.0)
HCT: 29.5 % — ABNORMAL LOW (ref 34.8–46.6)
HEMOGLOBIN: 9.4 g/dL — AB (ref 11.6–15.9)
LYMPH#: 0.7 10*3/uL — AB (ref 0.9–3.3)
LYMPH%: 7 % — ABNORMAL LOW (ref 14.0–49.7)
MCH: 29.8 pg (ref 25.1–34.0)
MCHC: 31.9 g/dL (ref 31.5–36.0)
MCV: 93.7 fL (ref 79.5–101.0)
MONO#: 0.5 10*3/uL (ref 0.1–0.9)
MONO%: 5.2 % (ref 0.0–14.0)
NEUT%: 87.7 % — ABNORMAL HIGH (ref 38.4–76.8)
NEUTROS ABS: 8.6 10*3/uL — AB (ref 1.5–6.5)
NRBC: 0 % (ref 0–0)
Platelets: 207 10*3/uL (ref 145–400)
RBC: 3.15 10*6/uL — AB (ref 3.70–5.45)
RDW: 17.7 % — AB (ref 11.2–14.5)
WBC: 9.8 10*3/uL (ref 3.9–10.3)

## 2013-05-22 LAB — POCT INR: INR: 3.9

## 2013-05-22 LAB — PROTIME-INR
INR: 3.9 — AB (ref 2.00–3.50)
Protime: 46.8 Seconds — ABNORMAL HIGH (ref 10.6–13.4)

## 2013-05-22 MED ORDER — SODIUM CHLORIDE 0.9 % IV SOLN
500.0000 mg/m2 | Freq: Once | INTRAVENOUS | Status: AC
  Administered 2013-05-22: 900 mg via INTRAVENOUS
  Filled 2013-05-22: qty 36

## 2013-05-22 MED ORDER — DEXAMETHASONE SODIUM PHOSPHATE 10 MG/ML IJ SOLN
10.0000 mg | Freq: Once | INTRAMUSCULAR | Status: AC
  Administered 2013-05-22: 10 mg via INTRAVENOUS

## 2013-05-22 MED ORDER — ONDANSETRON 8 MG/50ML IVPB (CHCC)
8.0000 mg | Freq: Once | INTRAVENOUS | Status: AC
  Administered 2013-05-22: 8 mg via INTRAVENOUS

## 2013-05-22 MED ORDER — SODIUM CHLORIDE 0.9 % IV SOLN
Freq: Once | INTRAVENOUS | Status: AC
  Administered 2013-05-22: 14:00:00 via INTRAVENOUS

## 2013-05-22 MED ORDER — DEXAMETHASONE SODIUM PHOSPHATE 10 MG/ML IJ SOLN
INTRAMUSCULAR | Status: AC
  Filled 2013-05-22: qty 1

## 2013-05-22 MED ORDER — ONDANSETRON 8 MG/NS 50 ML IVPB
INTRAVENOUS | Status: AC
  Filled 2013-05-22: qty 8

## 2013-05-22 NOTE — Patient Instructions (Signed)
Hold dose today then continue 5 mg daily.  Recheck INR on 05/29/13 with appointments already scheduled

## 2013-05-22 NOTE — Patient Instructions (Signed)
[facility] Discharge Instructions for Patients Receiving Chemotherapy  Today you received the following chemotherapy agents Alimta To help prevent nausea and vomiting after your treatment, we encourage you to take your nausea medication as prescribed.  If you develop nausea and vomiting that is not controlled by your nausea medication, call the clinic.   BELOW ARE SYMPTOMS THAT SHOULD BE REPORTED IMMEDIATELY:  *FEVER GREATER THAN 100.5 F  *CHILLS WITH OR WITHOUT FEVER  NAUSEA AND VOMITING THAT IS NOT CONTROLLED WITH YOUR NAUSEA MEDICATION  *UNUSUAL SHORTNESS OF BREATH  *UNUSUAL BRUISING OR BLEEDING  TENDERNESS IN MOUTH AND THROAT WITH OR WITHOUT PRESENCE OF ULCERS  *URINARY PROBLEMS  *BOWEL PROBLEMS  UNUSUAL RASH Items with * indicate a potential emergency and should be followed up as soon as possible.  Feel free to call the clinic you have any questions or concerns. The clinic phone number is [phone].     Pemetrexed injection (Alimta) What is this medicine? PEMETREXED (PEM e TREX ed) is a chemotherapy drug. This medicine affects cells that are rapidly growing, such as cancer cells and cells in your mouth and stomach. It is usually used to treat lung cancers like non-small cell lung cancer and mesothelioma. It may also be used to treat other cancers. This medicine may be used for other purposes; ask your health care provider or pharmacist if you have questions. COMMON BRAND NAME(S): Alimta What should I tell my health care provider before I take this medicine? They need to know if you have any of these conditions: -if you frequently drink alcohol containing beverages -infection (especially a virus infection such as chickenpox, cold sores, or herpes) -kidney disease -liver disease -low blood counts, like low platelets, red bloods, or white blood cells -an unusual or allergic reaction to pemetrexed, mannitol, other medicines, foods, dyes, or  preservatives -pregnant or trying to get pregnant -breast-feeding How should I use this medicine? This drug is given as an infusion into a vein. It is administered in a hospital or clinic by a specially trained health care professional. Talk to your pediatrician regarding the use of this medicine in children. Special care may be needed. Overdosage: If you think you have taken too much of this medicine contact a poison control center or emergency room at once. NOTE: This medicine is only for you. Do not share this medicine with others. What if I miss a dose? It is important not to miss your dose. Call your doctor or health care professional if you are unable to keep an appointment. What may interact with this medicine? -aspirin and aspirin-like medicines -medicines to increase blood counts like filgrastim, pegfilgrastim, sargramostim -methotrexate -NSAIDS, medicines for pain and inflammation, like ibuprofen or naproxen -probenecid -pyrimethamine -vaccines Talk to your doctor or health care professional before taking any of these medicines: -acetaminophen -aspirin -ibuprofen -ketoprofen -naproxen This list may not describe all possible interactions. Give your health care provider a list of all the medicines, herbs, non-prescription drugs, or dietary supplements you use. Also tell them if you smoke, drink alcohol, or use illegal drugs. Some items may interact with your medicine. What should I watch for while using this medicine? Visit your doctor for checks on your progress. This drug may make you feel generally unwell. This is not uncommon, as chemotherapy can affect healthy cells as well as cancer cells. Report any side effects. Continue your course of treatment even though you feel ill unless your doctor tells you to stop. In some cases, you   may be given additional medicines to help with side effects. Follow all directions for their use. Call your doctor or health care professional for  advice if you get a fever, chills or sore throat, or other symptoms of a cold or flu. Do not treat yourself. This drug decreases your body's ability to fight infections. Try to avoid being around people who are sick. This medicine may increase your risk to bruise or bleed. Call your doctor or health care professional if you notice any unusual bleeding. Be careful brushing and flossing your teeth or using a toothpick because you may get an infection or bleed more easily. If you have any dental work done, tell your dentist you are receiving this medicine. Avoid taking products that contain aspirin, acetaminophen, ibuprofen, naproxen, or ketoprofen unless instructed by your doctor. These medicines may hide a fever. Call your doctor or health care professional if you get diarrhea or mouth sores. Do not treat yourself. To protect your kidneys, drink water or other fluids as directed while you are taking this medicine. Men and women must use effective birth control while taking this medicine. You may also need to continue using effective birth control for a time after stopping this medicine. Do not become pregnant while taking this medicine. Tell your doctor right away if you think that you or your partner might be pregnant. There is a potential for serious side effects to an unborn child. Talk to your health care professional or pharmacist for more information. Do not breast-feed an infant while taking this medicine. This medicine may lower sperm counts. What side effects may I notice from receiving this medicine? Side effects that you should report to your doctor or health care professional as soon as possible: -allergic reactions like skin rash, itching or hives, swelling of the face, lips, or tongue -low blood counts - this medicine may decrease the number of white blood cells, red blood cells and platelets. You may be at increased risk for infections and bleeding. -signs of infection - fever or chills,  cough, sore throat, pain or difficulty passing urine -signs of decreased platelets or bleeding - bruising, pinpoint red spots on the skin, black, tarry stools, blood in the urine -signs of decreased red blood cells - unusually weak or tired, fainting spells, lightheadedness -breathing problems, like a dry cough -changes in emotions or moods -chest pain -confusion -diarrhea -high blood pressure -mouth or throat sores or ulcers -pain, swelling, warmth in the leg -pain on swallowing -swelling of the ankles, feet, hands -trouble passing urine or change in the amount of urine -vomiting -yellowing of the eyes or skin Side effects that usually do not require medical attention (report to your doctor or health care professional if they continue or are bothersome): -hair loss -loss of appetite -nausea -stomach upset This list may not describe all possible side effects. Call your doctor for medical advice about side effects. You may report side effects to FDA at 1-800-FDA-1088. Where should I keep my medicine? This drug is given in a hospital or clinic and will not be stored at home. NOTE: This sheet is a summary. It may not cover all possible information. If you have questions about this medicine, talk to your doctor, pharmacist, or health care provider.  2014, [employer] Standard. (2007-11-12 13:24:03)  

## 2013-05-22 NOTE — Progress Notes (Signed)
Pt seen during infusion today INR=3.9 on 5mg  daily Pt is one week into a 10 day course of Keflex No diet or medication changes other than ABX Will hold one dose today, then resume 5 mg daily She will be back next week to see Dr. Marko Plume We will see in CC same day

## 2013-05-22 NOTE — Patient Instructions (Signed)
[facility] Discharge Instructions for Patients Receiving Chemotherapy  Today you received the following chemotherapy agents Alimta To help prevent nausea and vomiting after your treatment, we encourage you to take your nausea medication as prescribed.  If you develop nausea and vomiting that is not controlled by your nausea medication, call the clinic.   BELOW ARE SYMPTOMS THAT SHOULD BE REPORTED IMMEDIATELY:  *FEVER GREATER THAN 100.5 F  *CHILLS WITH OR WITHOUT FEVER  NAUSEA AND VOMITING THAT IS NOT CONTROLLED WITH YOUR NAUSEA MEDICATION  *UNUSUAL SHORTNESS OF BREATH  *UNUSUAL BRUISING OR BLEEDING  TENDERNESS IN MOUTH AND THROAT WITH OR WITHOUT PRESENCE OF ULCERS  *URINARY PROBLEMS  *BOWEL PROBLEMS  UNUSUAL RASH Items with * indicate a potential emergency and should be followed up as soon as possible.  Feel free to call the clinic you have any questions or concerns. The clinic phone number is [phone].     Pemetrexed injection (Alimta) What is this medicine? PEMETREXED (PEM e TREX ed) is a chemotherapy drug. This medicine affects cells that are rapidly growing, such as cancer cells and cells in your mouth and stomach. It is usually used to treat lung cancers like non-small cell lung cancer and mesothelioma. It may also be used to treat other cancers. This medicine may be used for other purposes; ask your health care provider or pharmacist if you have questions. COMMON BRAND NAME(S): Alimta What should I tell my health care provider before I take this medicine? They need to know if you have any of these conditions: -if you frequently drink alcohol containing beverages -infection (especially a virus infection such as chickenpox, cold sores, or herpes) -kidney disease -liver disease -low blood counts, like low platelets, red bloods, or white blood cells -an unusual or allergic reaction to pemetrexed, mannitol, other medicines, foods, dyes, or  preservatives -pregnant or trying to get pregnant -breast-feeding How should I use this medicine? This drug is given as an infusion into a vein. It is administered in a hospital or clinic by a specially trained health care professional. Talk to your pediatrician regarding the use of this medicine in children. Special care may be needed. Overdosage: If you think you have taken too much of this medicine contact a poison control center or emergency room at once. NOTE: This medicine is only for you. Do not share this medicine with others. What if I miss a dose? It is important not to miss your dose. Call your doctor or health care professional if you are unable to keep an appointment. What may interact with this medicine? -aspirin and aspirin-like medicines -medicines to increase blood counts like filgrastim, pegfilgrastim, sargramostim -methotrexate -NSAIDS, medicines for pain and inflammation, like ibuprofen or naproxen -probenecid -pyrimethamine -vaccines Talk to your doctor or health care professional before taking any of these medicines: -acetaminophen -aspirin -ibuprofen -ketoprofen -naproxen This list may not describe all possible interactions. Give your health care provider a list of all the medicines, herbs, non-prescription drugs, or dietary supplements you use. Also tell them if you smoke, drink alcohol, or use illegal drugs. Some items may interact with your medicine. What should I watch for while using this medicine? Visit your doctor for checks on your progress. This drug may make you feel generally unwell. This is not uncommon, as chemotherapy can affect healthy cells as well as cancer cells. Report any side effects. Continue your course of treatment even though you feel ill unless your doctor tells you to stop. In some cases, you   may be given additional medicines to help with side effects. Follow all directions for their use. Call your doctor or health care professional for  advice if you get a fever, chills or sore throat, or other symptoms of a cold or flu. Do not treat yourself. This drug decreases your body's ability to fight infections. Try to avoid being around people who are sick. This medicine may increase your risk to bruise or bleed. Call your doctor or health care professional if you notice any unusual bleeding. Be careful brushing and flossing your teeth or using a toothpick because you may get an infection or bleed more easily. If you have any dental work done, tell your dentist you are receiving this medicine. Avoid taking products that contain aspirin, acetaminophen, ibuprofen, naproxen, or ketoprofen unless instructed by your doctor. These medicines may hide a fever. Call your doctor or health care professional if you get diarrhea or mouth sores. Do not treat yourself. To protect your kidneys, drink water or other fluids as directed while you are taking this medicine. Men and women must use effective birth control while taking this medicine. You may also need to continue using effective birth control for a time after stopping this medicine. Do not become pregnant while taking this medicine. Tell your doctor right away if you think that you or your partner might be pregnant. There is a potential for serious side effects to an unborn child. Talk to your health care professional or pharmacist for more information. Do not breast-feed an infant while taking this medicine. This medicine may lower sperm counts. What side effects may I notice from receiving this medicine? Side effects that you should report to your doctor or health care professional as soon as possible: -allergic reactions like skin rash, itching or hives, swelling of the face, lips, or tongue -low blood counts - this medicine may decrease the number of white blood cells, red blood cells and platelets. You may be at increased risk for infections and bleeding. -signs of infection - fever or chills,  cough, sore throat, pain or difficulty passing urine -signs of decreased platelets or bleeding - bruising, pinpoint red spots on the skin, black, tarry stools, blood in the urine -signs of decreased red blood cells - unusually weak or tired, fainting spells, lightheadedness -breathing problems, like a dry cough -changes in emotions or moods -chest pain -confusion -diarrhea -high blood pressure -mouth or throat sores or ulcers -pain, swelling, warmth in the leg -pain on swallowing -swelling of the ankles, feet, hands -trouble passing urine or change in the amount of urine -vomiting -yellowing of the eyes or skin Side effects that usually do not require medical attention (report to your doctor or health care professional if they continue or are bothersome): -hair loss -loss of appetite -nausea -stomach upset This list may not describe all possible side effects. Call your doctor for medical advice about side effects. You may report side effects to FDA at 1-800-FDA-1088. Where should I keep my medicine? This drug is given in a hospital or clinic and will not be stored at home. NOTE: This sheet is a summary. It may not cover all possible information. If you have questions about this medicine, talk to your doctor, pharmacist, or health care provider.  2014, [employer] Standard. (2007-11-12 13:24:03)  

## 2013-05-23 ENCOUNTER — Telehealth: Payer: Self-pay | Admitting: *Deleted

## 2013-05-23 NOTE — Telephone Encounter (Signed)
Called pt to f/u after new alimta chemo yest.  She reports that she is OK & doesn't have any problems at this time.  She did not have any questions or concerns at this time but was encouraged to call any time that she does.

## 2013-05-23 NOTE — Telephone Encounter (Signed)
Message copied by Jesse Fall on Fri May 23, 2013 11:45 AM ------      Message from: Christa See      Created: Thu May 22, 2013  3:02 PM      Regarding: 1st time Alimta f/u call      Contact: [phone]       Can you call and see how patient is after 1st time alimta on Thursday? MD is Livesay. Thanks! Kristen  ------

## 2013-05-29 ENCOUNTER — Ambulatory Visit: Payer: Medicare Other

## 2013-05-29 ENCOUNTER — Other Ambulatory Visit (HOSPITAL_BASED_OUTPATIENT_CLINIC_OR_DEPARTMENT_OTHER): Payer: Medicare Other

## 2013-05-29 ENCOUNTER — Telehealth: Payer: Self-pay | Admitting: Oncology

## 2013-05-29 ENCOUNTER — Ambulatory Visit (HOSPITAL_BASED_OUTPATIENT_CLINIC_OR_DEPARTMENT_OTHER): Payer: Medicare Other | Admitting: Oncology

## 2013-05-29 ENCOUNTER — Encounter: Payer: Self-pay | Admitting: Oncology

## 2013-05-29 VITALS — BP 115/75 | HR 91 | Temp 97.9°F | Resp 18 | Ht 67.0 in | Wt 148.1 lb

## 2013-05-29 DIAGNOSIS — I63432 Cerebral infarction due to embolism of left posterior cerebral artery: Secondary | ICD-10-CM

## 2013-05-29 DIAGNOSIS — I8003 Phlebitis and thrombophlebitis of superficial vessels of lower extremities, bilateral: Secondary | ICD-10-CM

## 2013-05-29 DIAGNOSIS — C779 Secondary and unspecified malignant neoplasm of lymph node, unspecified: Secondary | ICD-10-CM

## 2013-05-29 DIAGNOSIS — C569 Malignant neoplasm of unspecified ovary: Secondary | ICD-10-CM

## 2013-05-29 DIAGNOSIS — I8000 Phlebitis and thrombophlebitis of superficial vessels of unspecified lower extremity: Secondary | ICD-10-CM

## 2013-05-29 DIAGNOSIS — C787 Secondary malignant neoplasm of liver and intrahepatic bile duct: Secondary | ICD-10-CM

## 2013-05-29 DIAGNOSIS — Z86718 Personal history of other venous thrombosis and embolism: Secondary | ICD-10-CM

## 2013-05-29 LAB — CBC & DIFF AND RETIC
BASO%: 0 % (ref 0.0–2.0)
BASOS ABS: 0 10*3/uL (ref 0.0–0.1)
EOS%: 0 % (ref 0.0–7.0)
Eosinophils Absolute: 0 10*3/uL (ref 0.0–0.5)
HEMATOCRIT: 29.1 % — AB (ref 34.8–46.6)
HEMOGLOBIN: 9.6 g/dL — AB (ref 11.6–15.9)
IMMATURE RETIC FRACT: 3.3 % (ref 1.60–10.00)
LYMPH#: 0.5 10*3/uL — AB (ref 0.9–3.3)
LYMPH%: 23.4 % (ref 14.0–49.7)
MCH: 30.2 pg (ref 25.1–34.0)
MCHC: 33 g/dL (ref 31.5–36.0)
MCV: 91.5 fL (ref 79.5–101.0)
MONO#: 0.3 10*3/uL (ref 0.1–0.9)
MONO%: 12.4 % (ref 0.0–14.0)
NEUT%: 64.2 % (ref 38.4–76.8)
NEUTROS ABS: 1.3 10*3/uL — AB (ref 1.5–6.5)
Platelets: 54 10*3/uL — ABNORMAL LOW (ref 145–400)
RBC: 3.18 10*6/uL — ABNORMAL LOW (ref 3.70–5.45)
RDW: 16.4 % — AB (ref 11.2–14.5)
RETIC CT ABS: 6.36 10*3/uL — AB (ref 33.70–90.70)
WBC: 2.1 10*3/uL — AB (ref 3.9–10.3)
nRBC: 0 % (ref 0–0)

## 2013-05-29 LAB — COMPREHENSIVE METABOLIC PANEL (CC13)
ALBUMIN: 2.9 g/dL — AB (ref 3.5–5.0)
ALT: 16 U/L (ref 0–55)
AST: 28 U/L (ref 5–34)
Alkaline Phosphatase: 191 U/L — ABNORMAL HIGH (ref 40–150)
Anion Gap: 9 mEq/L (ref 3–11)
BUN: 37.1 mg/dL — AB (ref 7.0–26.0)
CO2: 21 mEq/L — ABNORMAL LOW (ref 22–29)
Calcium: 9.4 mg/dL (ref 8.4–10.4)
Chloride: 103 mEq/L (ref 98–109)
Creatinine: 0.8 mg/dL (ref 0.6–1.1)
Glucose: 148 mg/dl — ABNORMAL HIGH (ref 70–140)
POTASSIUM: 4.5 meq/L (ref 3.5–5.1)
Sodium: 133 mEq/L — ABNORMAL LOW (ref 136–145)
Total Bilirubin: 0.66 mg/dL (ref 0.20–1.20)
Total Protein: 7 g/dL (ref 6.4–8.3)

## 2013-05-29 LAB — PROTIME-INR
INR: 3.3 (ref 2.00–3.50)
PROTIME: 39.6 s — AB (ref 10.6–13.4)

## 2013-05-29 MED ORDER — PEGFILGRASTIM INJECTION 6 MG/0.6ML
6.0000 mg | Freq: Once | SUBCUTANEOUS | Status: AC
Start: 2013-05-29 — End: 2013-05-29
  Administered 2013-05-29: 6 mg via SUBCUTANEOUS
  Filled 2013-05-29: qty 0.6

## 2013-05-29 NOTE — Patient Instructions (Signed)
For rash: one more decadron when you get home today, with food. OK to use benadryl 25 mg every 6 hrs if bad itching. Sarna lotion OTC has been helpful.  Take Claritin (lortadine) 10 mg when you get home today, to decrease aches from neulasta shot for white count.  No aspirin between now and visit next week  Coumadin 2.5 mg today   Recheck CBC and INR Fri 2-6, as we will need to hold coumadin if platelets still dropping

## 2013-05-29 NOTE — Progress Notes (Signed)
OFFICE PROGRESS NOTE   05/29/2013   Physicians:P.Clayton Bibles, MD ([facility])/ [place], Daneen Schick, Antony Contras   INTERVAL HISTORY:  Patient is seen, alone for visit, having had first Alimta on 05-22-13 for progressive ovarian cancer. She has had puritic rash on upper chest for ~ 2 days, some fatigue and some nausea controlled with zofran x2. She has had no bleeding, still on coumadin 5 mg daily and platelets down to 54k today with INR 3.3. She took decadron 4 mg last pm for the puritic rash; she has not used benadryl or any topicals. The rash seems slightly better today. She did take 3 days of steroids around Alimta. Peripheral IV access was easily accomplished for the Alimta. She is eating some and trying to drink fluids. Bowels are moving. She has no symptoms of superficial thrombophlebitis now and no new neurologic symptoms.   ONCOLOGIC HISTORY   Ovarian cancer   06/12/2012 Initial Diagnosis Ovarian cancer   06/21/2012 - 08/16/2012 Chemotherapy dose dense paclitaxel and carboplatin   09/10/2012 Surgery suboptimal debulking    Chemotherapy liposomal dox planned but EF 15%   12/02/2012 -  Chemotherapy gemcitabine    Patient developed constipation with some right abdominal pain in May 2013. She saw GI physician in ~ Aug 2013, but was unable to tolerate prep for colonoscopy. She developed LLE superficial phlebitis in Jan 2014 , followed by swelling and discomfort LLE with venous doppler at [facility] in [facility] on 05-21-12 with acute thrombus left common femoral vein. 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 She was begun on lovenox and transitioned to Xarelto by Dr Coletta Memos. CA 125 was 569. She saw Dr Alycia Rossetti on 06-12-12, with distended abdomen and 15 cm fixed pelvic mass. She had US paracentesis by IR 06-12-12 for 2.8 liters, cytology ( NZB14 - 126) adenocarcinoma. Neoadjuvant dose dense carbo/taxol was beun 06-21-12 with 3 cycles given thru 08-16-12. She needed neupogen days 2,8 and 16 on this regimen. She had good partial response by CTs 08-19-12 and had improvement in CA 125 from 1041 in Feb 2014 down to 92 on 08-21-12, after 3 cycles dose dense chemo. She had IVC filter placed prior to exploratory laparotomy with extensive lysis of adhesions, left ureteral lysis, and bilateral salpingo-oophorectomy by Dr Alycia Rossetti on 09-10-12, suboptimal debulking. Operative findings:diffuse carcinomatosis of the residual omentum involving the entire transverse colon. Transverse colon densely adherent to the anterior abdominal wall. No disease throughout the small bowel. Mesenteric adenopathy with lymph nodes measuring 1 cm, involving the root of the small bowel mesentery. ~5 centimeter right ovarian mass and ~ 6 cm left ovarian mass. Small bowel densely adherent to the pelvis. 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 Chemotherapy resumed with dose dense taxol carboplatin on 09-27-12, completing cycle 5 on 11-08-12. CA 125 increased from 92 in April to 130  post op in June, then 225 late July and repeat 318. CT CAP done 11-19-12 showed increase in nodular thickening along falciform ligament and small peritoneal nodule right iliac fossa. Plan was to change in chemotherapy to doxil with avastin, however echocardiogram 11-27-12 unexpectedly had EF of 15%. 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 She was hospitalized 8-13 thru 12-11-12, had some bleeding into the embolic CVA on 2-69-48, and treated with heparin transitioned to coumadin. She was stable to resume gemzar with cycle 2 at 800 mg/m2 given on 12-30-12,then platelets down to 70k by day 8. She had progressive superficial thrombophlebitis LE on coumadin by 12-25-12 and was changed to full dose lovenox then; there was difficulty getting lovenox covered by insurance. 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 Gemzar was tolerated well at 500 mg/m2, without significant thrombocytopenia, given thru 04-28-13. She progressed in liver and nodes by CT 05-13-13. She wanted to continue treatment, such that Alimta was begun 05-22-13. Platelets were ~ 50k by day 8; neulasta was given day 8.    Review of systems as above, also: No fever or symptoms of infection. No abdominal pain. No increased SOB. No mucositis symptoms. Minimal dried blood from nose occasionally, unchanged. Remainder of 10 point Review of Systems negative.  Objective:  Vital signs in last 24 hours:  BP 115/75  Pulse 91  Temp(Src) 97.9 F (36.6 C) (Oral)  Resp 18  Ht 5\' 7"  (1.702 m)  Wt 148 lb 1.6 oz (67.178 kg)  BMI 23.19 kg/m2 Weight is down 5 lbs. Alert, oriented and appropriate. Ambulatory without assistance.  Alopecia  HEENT:PERRL, sclerae not icteric. Oral  mucosa moist without lesions, posterior pharynx clear.  Neck supple. No JVD.  Lymphatics:no cervical,suraclavicularl adenopathy Resp: clear to auscultation bilaterally and normal percussion bilaterally Cardio: regular rate and rhythm. No gallop. GI: soft, nontender, not distended, no appreciable mass or organomegaly. Some bowel sounds. Surgical incision not remarkable. Musculoskeletal/ Extremities: without pitting edema, cords, tenderness. Prominent superficial varicosities LE baseline. Neuro: no change peripheral neuropathy. Speech fluent. No focal motor deficits. CN intact. Cerebellar intact. Skin erythema without palpable rash anterior chest and neck, no petechiae or SQ bleeding. 3 cm ecchymosis right upper arm. 4 mm verrucous lesion left medial thigh, not tender.   Lab Results:  Results for orders placed in visit on 05/29/13  CBC & DIFF AND RETIC      Result Value Range   WBC 2.1 (*) 3.9 - 10.3 10e3/uL   NEUT# 1.3 (*) 1.5 - 6.5 10e3/uL   HGB 9.6 (*) 11.6 - 15.9 g/dL   HCT 29.1 (*) 34.8 - 46.6 %   Platelets 54 (*) 145 - 400 10e3/uL   MCV 91.5  79.5 - 101.0 fL   MCH 30.2  25.1 - 34.0 pg   MCHC 33.0  31.5 - 36.0 g/dL   RBC 3.18 (*) 3.70 - 5.45 10e6/uL   RDW 16.4 (*) 11.2 - 14.5 %   lymph# 0.5 (*) 0.9 - 3.3 10e3/uL  MONO# 0.3  0.1 - 0.9 10e3/uL   Eosinophils Absolute 0.0  0.0 - 0.5 10e3/uL   Basophils Absolute 0.0  0.0 - 0.1 10e3/uL   NEUT% 64.2  38.4 - 76.8 %   LYMPH% 23.4  14.0 - 49.7 %   MONO% 12.4  0.0 - 14.0 %   EOS% 0.0  0.0 - 7.0 %   BASO% 0.0  0.0 - 2.0 %   nRBC 0  0 - 0 %   Retic % <0.38 (*) 0.5 - 1.5 %   Retic Ct Abs 6.36 (*) 33.70 - 90.70 10e3/uL   Immature Retic Fract 3.30  1.60 - 10.00 %  COMPREHENSIVE METABOLIC PANEL (0000000)      Result Value Range   Sodium 133 (*) 136 - 145 mEq/L   Potassium 4.5  3.5 - 5.1 mEq/L   Chloride 103  98 - 109 mEq/L   CO2 21 (*) 22 - 29 mEq/L   Glucose 148 (*) 70 - 140 mg/dl   BUN 37.1 (*) 7.0 - 26.0 mg/dL   Creatinine 0.8  0.6  - 1.1 mg/dL   Total Bilirubin 0.66  0.20 - 1.20 mg/dL   Alkaline Phosphatase 191 (*) 40 - 150 U/L   AST 28  5 - 34 U/L   ALT 16  0 - 55 U/L   Total Protein 7.0  6.4 - 8.3 g/dL   Albumin 2.9 (*) 3.5 - 5.0 g/dL   Calcium 9.4  8.4 - 10.4 mg/dL   Anion Gap 9  3 - 11 mEq/L  PROTIME-INR      Result Value Range   Protime 39.6 (*) 10.6 - 13.4 Seconds   INR 3.30  2.00 - 3.50   Lovenox No       Studies/Results:  No results found.  Medications: I have reviewed the patient's current medications. Neulasta given today. She will hold baby ASA. She will take 2.5 mg coumadin today. We will dose coumadin tomorrow based on repeat CBC and INR then. Further Alimta will need to be dose reduced at least by 25% with counts from today, possibly more depending on nadir counts. She can take decadron 4 mg again today for Alimta rash, with prn benadryl and Sarna lotion. She is given neulasta today.  DISCUSSION: patient understands that counts likely are not yet at nadir; she will need dose reduction per Alimta guidelines for further cycles. She has previously dropped INR quickly with holding coumadin, and frequently then had flares of the superficial thromboses, so will give 2.5 mg of coumadin today even with this platelet count, and recheck both CBC and INR tomorrow with MD to dose coumadin then. She knows to call if significant bleeding or symptoms of infection.  She has requested and been given copy of op note. We have reviewed recent CT information concerning "pelvic mass" that is uterus.   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 First Alimta [id number] complicated by puritic rash despite decadron used around Alimta, and dropping WBC and platelets not yet at nadir. Medications as above including neulasta today. Will need dose decrease on further alimta based on nadir counts. 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 IVC filter in. Do not need INR any higher with thrombocytopenia: dosing as above. Repeat INR with CBC tomorrow. Liver mets likely will increase sensitivity to coumadin. 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  5.long past tobacco  6.flu vaccine done 7.PICC out   She is still remarkably functional and reasonable quality of life, but is well aware of severity of medical problems.     Kriste Broman P, MD   05/29/2013, 9:54 PM

## 2013-05-30 ENCOUNTER — Other Ambulatory Visit (HOSPITAL_BASED_OUTPATIENT_CLINIC_OR_DEPARTMENT_OTHER): Payer: Medicare Other

## 2013-05-30 ENCOUNTER — Telehealth: Payer: Self-pay

## 2013-05-30 DIAGNOSIS — C569 Malignant neoplasm of unspecified ovary: Secondary | ICD-10-CM

## 2013-05-30 DIAGNOSIS — Z86718 Personal history of other venous thrombosis and embolism: Secondary | ICD-10-CM

## 2013-05-30 DIAGNOSIS — I8000 Phlebitis and thrombophlebitis of superficial vessels of unspecified lower extremity: Secondary | ICD-10-CM

## 2013-05-30 DIAGNOSIS — I63432 Cerebral infarction due to embolism of left posterior cerebral artery: Secondary | ICD-10-CM

## 2013-05-30 LAB — CBC WITH DIFFERENTIAL/PLATELET
BASO%: 0.1 % (ref 0.0–2.0)
Basophils Absolute: 0 10*3/uL (ref 0.0–0.1)
EOS ABS: 0 10*3/uL (ref 0.0–0.5)
EOS%: 0.4 % (ref 0.0–7.0)
HCT: 29 % — ABNORMAL LOW (ref 34.8–46.6)
HGB: 9.5 g/dL — ABNORMAL LOW (ref 11.6–15.9)
LYMPH%: 10.6 % — AB (ref 14.0–49.7)
MCH: 30.7 pg (ref 25.1–34.0)
MCHC: 32.7 g/dL (ref 31.5–36.0)
MCV: 93.9 fL (ref 79.5–101.0)
MONO#: 0.9 10*3/uL (ref 0.1–0.9)
MONO%: 12.6 % (ref 0.0–14.0)
NEUT#: 5.5 10*3/uL (ref 1.5–6.5)
NEUT%: 76.3 % (ref 38.4–76.8)
Platelets: 43 10*3/uL — ABNORMAL LOW (ref 145–400)
RBC: 3.09 10*6/uL — ABNORMAL LOW (ref 3.70–5.45)
RDW: 16.9 % — AB (ref 11.2–14.5)
WBC: 7.3 10*3/uL (ref 3.9–10.3)
lymph#: 0.8 10*3/uL — ABNORMAL LOW (ref 0.9–3.3)

## 2013-05-30 LAB — PROTIME-INR
INR: 4.4 — ABNORMAL HIGH (ref 2.00–3.50)
PROTIME: 52.8 s — AB (ref 10.6–13.4)

## 2013-05-30 LAB — CA 125: CA 125: 3107.6 U/mL — ABNORMAL HIGH (ref 0.0–30.2)

## 2013-05-30 NOTE — Telephone Encounter (Signed)
Reviewed with patient bleeding precautions as her platelets are 43 k today and INR is 4.4.  ANC good at 5.5. She is to hold her coumadin and not use ASA products until visit 06-02-13.  No bleeding noted except some slight blood tinged drainage from nose, which is not new. Monique [name] will get saline nasal spray and use as directed.  She has humidified air at home.  She will have some afrin on hand incase there is increased nasal bleeding. She is to call the office or go to the ED if significant bleeding occurs over the weekend per Dr. Marko Plume.  Monique [name] encouraged to eat some salads over the weekend.  Patient verbalized understanding.

## 2013-05-31 ENCOUNTER — Other Ambulatory Visit: Payer: Self-pay | Admitting: Oncology

## 2013-06-02 ENCOUNTER — Telehealth: Payer: Self-pay | Admitting: Oncology

## 2013-06-02 ENCOUNTER — Encounter: Payer: Self-pay | Admitting: Oncology

## 2013-06-02 ENCOUNTER — Other Ambulatory Visit (HOSPITAL_BASED_OUTPATIENT_CLINIC_OR_DEPARTMENT_OTHER): Payer: Medicare Other

## 2013-06-02 ENCOUNTER — Ambulatory Visit: Payer: Medicare Other | Admitting: Pharmacist

## 2013-06-02 ENCOUNTER — Ambulatory Visit (HOSPITAL_BASED_OUTPATIENT_CLINIC_OR_DEPARTMENT_OTHER): Payer: Medicare Other | Admitting: Oncology

## 2013-06-02 VITALS — BP 114/73 | HR 74 | Temp 99.4°F | Resp 18 | Ht 67.0 in | Wt 150.2 lb

## 2013-06-02 DIAGNOSIS — I639 Cerebral infarction, unspecified: Secondary | ICD-10-CM

## 2013-06-02 DIAGNOSIS — Z79899 Other long term (current) drug therapy: Secondary | ICD-10-CM

## 2013-06-02 DIAGNOSIS — C787 Secondary malignant neoplasm of liver and intrahepatic bile duct: Secondary | ICD-10-CM

## 2013-06-02 DIAGNOSIS — I63432 Cerebral infarction due to embolism of left posterior cerebral artery: Secondary | ICD-10-CM

## 2013-06-02 DIAGNOSIS — Z86718 Personal history of other venous thrombosis and embolism: Secondary | ICD-10-CM

## 2013-06-02 DIAGNOSIS — I808 Phlebitis and thrombophlebitis of other sites: Secondary | ICD-10-CM

## 2013-06-02 DIAGNOSIS — I428 Other cardiomyopathies: Secondary | ICD-10-CM

## 2013-06-02 DIAGNOSIS — C569 Malignant neoplasm of unspecified ovary: Secondary | ICD-10-CM

## 2013-06-02 DIAGNOSIS — I8003 Phlebitis and thrombophlebitis of superficial vessels of lower extremities, bilateral: Secondary | ICD-10-CM

## 2013-06-02 DIAGNOSIS — M545 Low back pain, unspecified: Secondary | ICD-10-CM

## 2013-06-02 DIAGNOSIS — C561 Malignant neoplasm of right ovary: Secondary | ICD-10-CM

## 2013-06-02 DIAGNOSIS — C779 Secondary and unspecified malignant neoplasm of lymph node, unspecified: Secondary | ICD-10-CM

## 2013-06-02 DIAGNOSIS — Z87891 Personal history of nicotine dependence: Secondary | ICD-10-CM

## 2013-06-02 LAB — CBC WITH DIFFERENTIAL/PLATELET
BASO%: 0.5 % (ref 0.0–2.0)
BASOS ABS: 0.2 10*3/uL — AB (ref 0.0–0.1)
EOS ABS: 0.1 10*3/uL (ref 0.0–0.5)
EOS%: 0.2 % (ref 0.0–7.0)
HCT: 26.1 % — ABNORMAL LOW (ref 34.8–46.6)
HGB: 8.6 g/dL — ABNORMAL LOW (ref 11.6–15.9)
LYMPH%: 6.7 % — AB (ref 14.0–49.7)
MCH: 30.4 pg (ref 25.1–34.0)
MCHC: 33 g/dL (ref 31.5–36.0)
MCV: 92.2 fL (ref 79.5–101.0)
MONO#: 6.2 10*3/uL — ABNORMAL HIGH (ref 0.1–0.9)
MONO%: 13.9 % (ref 0.0–14.0)
NEUT%: 78.7 % — ABNORMAL HIGH (ref 38.4–76.8)
NEUTROS ABS: 35.2 10*3/uL — AB (ref 1.5–6.5)
PLATELETS: 54 10*3/uL — AB (ref 145–400)
RBC: 2.83 10*6/uL — ABNORMAL LOW (ref 3.70–5.45)
RDW: 17.4 % — ABNORMAL HIGH (ref 11.2–14.5)
WBC: 44.7 10*3/uL — ABNORMAL HIGH (ref 3.9–10.3)
lymph#: 3 10*3/uL (ref 0.9–3.3)
nRBC: 0 % (ref 0–0)

## 2013-06-02 LAB — COMPREHENSIVE METABOLIC PANEL (CC13)
ALT: 23 U/L (ref 0–55)
ANION GAP: 7 meq/L (ref 3–11)
AST: 34 U/L (ref 5–34)
Albumin: 2.8 g/dL — ABNORMAL LOW (ref 3.5–5.0)
Alkaline Phosphatase: 269 U/L — ABNORMAL HIGH (ref 40–150)
BILIRUBIN TOTAL: 0.62 mg/dL (ref 0.20–1.20)
BUN: 23.4 mg/dL (ref 7.0–26.0)
CO2: 23 meq/L (ref 22–29)
Calcium: 9 mg/dL (ref 8.4–10.4)
Chloride: 103 mEq/L (ref 98–109)
Creatinine: 0.9 mg/dL (ref 0.6–1.1)
GLUCOSE: 114 mg/dL (ref 70–140)
Potassium: 4.2 mEq/L (ref 3.5–5.1)
Sodium: 133 mEq/L — ABNORMAL LOW (ref 136–145)
Total Protein: 6.3 g/dL — ABNORMAL LOW (ref 6.4–8.3)

## 2013-06-02 LAB — PROTIME-INR
INR: 1.7 — AB (ref 2.00–3.50)
Protime: 20.4 Seconds — ABNORMAL HIGH (ref 10.6–13.4)

## 2013-06-02 LAB — POCT INR: INR: 1.7

## 2013-06-02 NOTE — Telephone Encounter (Signed)
, °

## 2013-06-02 NOTE — Patient Instructions (Signed)
resume baby ASA today and to take coumadin 5 mg today, 2.5 mg 2-10, 5 mg 2-11 and 2-12 prior to repeat INR and CBC on 06-06-13.

## 2013-06-02 NOTE — Progress Notes (Signed)
OFFICE PROGRESS NOTE   06/02/2013   Physicians:P.Clayton Bibles, MD ([facility])/ [place], Daneen Schick, Antony Contras   INTERVAL HISTORY:  Patient is seen, alone for visit, in continuing attention to recently progressive metastatic ovarian cancer for which she began Alimta 05-22-13, in follow up of low blood counts last week (thrombocytopenia, leukopenia, anemia) related to chemotherapy, puritic rash from Alimta and excessively elevated INR also last week. She held coumadin 2-6, 2-7 and 2-8, and held baby ASA since 2-5. She had neulasta on 05-29-13. She had no significant bleeding over this weekend and is feeling a little better overall than last week, tho she notices some low back pain likely neulasta related. She has felt different firm cords right inner thigh and left inner calf in past 24 hours, without erythema or tenderness. Rash less puritic, Sarna lotion helpful. Otherwise she denies abdominal or pelvic pain, LE swelling, increased SOB or other cardiac symptoms. She does not have central catheter now.  ONCOLOGIC HISTORY   Ovarian cancer   06/12/2012 Initial Diagnosis Ovarian cancer   06/21/2012 - 08/16/2012 Chemotherapy dose dense paclitaxel and carboplatin   09/10/2012 Surgery suboptimal debulking    Chemotherapy liposomal dox planned but EF 15%   12/02/2012 -  Chemotherapy gemcitabine   Patient developed constipation with some right abdominal pain in May 2013. She saw GI physician in ~ Aug 2013, but was unable to tolerate prep for colonoscopy. She developed LLE superficial phlebitis in Jan 2014 , followed by swelling and discomfort LLE with venous doppler at [facility] on 05-21-12 with acute thrombus left common femoral vein. 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 She was begun on lovenox and transitioned to Xarelto by Dr Coletta Memos. CA 125 was 569. She saw Dr Alycia Rossetti on 06-12-12, with distended abdomen and 15 cm fixed pelvic mass. She had US paracentesis by IR 06-12-12 for 2.8 liters, cytology ( NZB14 - 126) adenocarcinoma. Neoadjuvant dose dense carbo/taxol was beun 06-21-12 with 3 cycles given thru 08-16-12. She needed neupogen days 2,8 and 16 on this regimen. She had good partial response by CTs 08-19-12 and had improvement in CA 125 from 1041 in Feb 2014 down to 92 on 08-21-12, after 3 cycles dose dense chemo. She had IVC filter placed prior to exploratory laparotomy with extensive lysis of adhesions, left ureteral lysis, and bilateral salpingo-oophorectomy by Dr Alycia Rossetti on 09-10-12, suboptimal debulking. Operative findings:diffuse carcinomatosis of the residual omentum involving the entire transverse colon. Transverse colon densely adherent to the anterior abdominal wall. No disease throughout the small bowel. Mesenteric adenopathy with lymph nodes measuring 1 cm, involving the root of the small bowel mesentery. ~5 centimeter right ovarian mass and ~ 6 cm left ovarian mass. Small bowel densely adherent to the pelvis. 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 Chemotherapy resumed with dose dense taxol carboplatin on 09-27-12, completing cycle 5 on 11-08-12. CA 125 increased from 92 in April to 130 post op in June, then 225 late July and repeat 318. CT CAP done 11-19-12 showed increase in nodular thickening along falciform ligament and small peritoneal nodule right iliac fossa. Plan was to change in chemotherapy to doxil with avastin, however echocardiogram 11-27-12 unexpectedly had EF of 15%. 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 She was hospitalized 8-13 thru 12-11-12, had some bleeding into the embolic CVA on 10-21-50, and treated with heparin transitioned to coumadin. She was stable to resume gemzar with cycle 2 at 800 mg/m2 given on 12-30-12,then platelets down to 70k by day 8. She had progressive superficial thrombophlebitis LE on coumadin by 12-25-12 and was changed to full dose lovenox then; there was difficulty getting lovenox covered by insurance. 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 Gemzar was tolerated well at 500 mg/m2, without significant thrombocytopenia, given thru 04-28-13. She progressed in liver and nodes by CT 05-13-13. She wanted to continue treatment, such that Alimta was begun 05-22-13. Platelets were 43k by day 9 cycle 1; neulasta was given day 8.      Review of systems as above, also: No fever. No nausea now. Slept well last pm. Remainder of 10 point Review of Systems negative.  Objective:  Vital signs in last 24 hours:  BP 114/73  Pulse 74  Temp(Src) 99.4 F (37.4 C) (Oral)  Resp 18  Ht 5\' 7"  (1.702 m)  Wt 150 lb 3.2 oz (68.13 kg)  BMI 23.52 kg/m2  Alert, oriented and appropriate. Ambulatory without assistance.   Alopecia, wearing wig  HEENT:PERRL, sclerae not icteric. Oral mucosa moist without lesions, posterior pharynx clear.  Neck supple. No JVD.  Lymphatics:no cervical,suraclavicular or inguinal adenopathy Resp: clear to auscultation bilaterally and normal percussion bilaterally Cardio: regular rate and rhythm. No gallop. GI: soft, nontender, not distended, no mass or organomegaly. Normally active bowel sounds. Surgical incision not remarkable. Musculoskeletal/ Extremities: without pitting edema or tenderness. Right mid thigh medially with 8-9cm cord not tender, minimal dull discoloration; left calf medially with 4 cm cord also no tenderness or erythema Neuro: Speech essentially fluent, CN intact, no new deficits Skin: nonpalpable sight rash across anterior chest centrally minimally improved, without ecchymosis, petechiae elsewhere tho skin dry thruout.   Lab Results:  Results for orders placed in visit on 06/02/13  CBC WITH DIFFERENTIAL      Result Value Range   WBC 44.7 (*) 3.9 - 10.3 10e3/uL   NEUT# 35.2 (*) 1.5 - 6.5 10e3/uL   HGB 8.6 (*) 11.6 - 15.9 g/dL   HCT 26.1 (*) 34.8 - 46.6 %   Platelets 54 (*) 145 - 400 10e3/uL   MCV 92.2  79.5 - 101.0 fL   MCH 30.4  25.1 - 34.0 pg   MCHC 33.0  31.5 - 36.0 g/dL   RBC 2.83 (*) 3.70 - 5.45 10e6/uL   RDW 17.4 (*) 11.2 - 14.5 %   lymph# 3.0  0.9 - 3.3 10e3/uL   MONO# 6.2 (*) 0.1 - 0.9 10e3/uL   Eosinophils Absolute 0.1  0.0 - 0.5 10e3/uL   Basophils Absolute 0.2 (*) 0.0 - 0.1 10e3/uL   NEUT% 78.7 (*) 38.4 - 76.8 %   LYMPH% 6.7 (*) 14.0 - 49.7 %   MONO% 13.9  0.0 - 14.0 %   EOS% 0.2  0.0 - 7.0 %   BASO% 0.5  0.0 - 2.0 %   nRBC 0  0 - 0 %  COMPREHENSIVE METABOLIC PANEL (HB71)      Result Value Range   Sodium 133 (*) 136 - 145 mEq/L   Potassium 4.2  3.5 - 5.1 mEq/L   Chloride 103  98 - 109 mEq/L   CO2 23  22 - 29 mEq/L   Glucose 114  70 - 140 mg/dl   BUN 23.4  7.0 - 26.0 mg/dL   Creatinine 0.9  0.6 - 1.1 mg/dL   Total Bilirubin 0.62   0.20 - 1.20 mg/dL   Alkaline Phosphatase 269 (*) 40 - 150 U/L   AST 34  5 - 34 U/L   ALT 23  0 - 55 U/L   Total Protein 6.3 (*) 6.4 - 8.3 g/dL   Albumin 2.8 (*) 3.5 - 5.0 g/dL   Calcium 9.0  8.4 - 10.4 mg/dL   Anion Gap 7  3 - 11 mEq/L  PROTIME-INR      Result Value Range   Protime 20.4 (*) 10.6 - 13.4 Seconds   INR 1.70 (*) 2.00 - 3.50   Lovenox No      Leukocytosis/ neutrophilia related to neulasta.   CA125 05-29-12: 3107, this having been 3049 on 04-28-13 prior to first Alimta on 05-22-13.  Studies/Results:  No results found.  Medications: I have reviewed the patient's current medications. She is given written and oral instructions to resume baby ASA today and to take coumadin 5 mg today, 2.5 mg 2-10, 5 mg 2-11 and 2-12 prior to repeat INR and CBC on 06-06-13.  DISCUSSION Discussed marker as above, hopefully leveling out with the initial Alimta. None of symptoms suggest further progression of cancer since treatment with Alimta began. Will dose reduce cycke 2 alimta per recommendations, based on platelets <50k no bleeding. Coumadin and ASA as above. Repeat CBC and INR on 2-13. Patient to call if bleeding. We have discussed marker and she has printed copy of lab.  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 Chemo changed to Alimta 6-96-78 complicated by puritic rash despite decadron, and low counts. WBC up today post neulasta, platelets hopefully beginning to recover.. Will need dose decrease on further alimta based on nadir counts. Due cycle 2 alimta on 06-12-13 and will give neulasta 2-20. I will see her 2-23 and 3-11 prior to cycle 3 due 3-12. 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 IVC filter in.  Liver mets likely will increase sensitivity to coumadin; she has previously had rapid recurrence of superficial thrombophlebitis off anticoagulation. Resume coumadin  and ASA as above. Repeat INR with CBC on 2-13 3.cardiomyopathy: EF 15%, followed by Dr Tamala Julian 4.DNR patient's request but still treating in attempt to control disease.  5.long past tobacco  6.flu vaccine done  7.PICC out   Patient is in agreement with plan and will call if needed between visits.      Kelyse Pask P, MD   06/02/2013, 11:47 AM

## 2013-06-02 NOTE — Patient Instructions (Signed)
Resume baby aspirin daily  Coumadin 5 mg today, 2.5 mg on 2-10, then 5 mg 2-11 and 2-12.  We will recheck platelets and INR on 2-13.

## 2013-06-02 NOTE — Progress Notes (Signed)
Patient seen by Dr. Marko Plume today Dr. Marko Plume dosed Monique [name]'s coumadin Plan per Dr. Marko Plume: Pt can resume baby ASA today and take coumadin 5 mg today On 2/10 take 2.5 mg Take 5 mg on 2/11 and 2/12 prior to repeat INR and CBC on 06/06/13. Dr. Marko Plume will again Dose patient's coumadin on 06/06/13 Patient will be scheduled for lab, chemo and coumadin clinic on 06/12/13 and pharmacist will see Monique [name] in infusion area

## 2013-06-03 ENCOUNTER — Telehealth: Payer: Self-pay | Admitting: *Deleted

## 2013-06-03 LAB — CA 125: CA 125: 1922.7 U/mL — AB (ref 0.0–30.2)

## 2013-06-03 NOTE — Telephone Encounter (Signed)
Per staff message and POF I have scheduled appts.  JMW  

## 2013-06-04 ENCOUNTER — Telehealth: Payer: Self-pay

## 2013-06-04 DIAGNOSIS — I8003 Phlebitis and thrombophlebitis of superficial vessels of lower extremities, bilateral: Secondary | ICD-10-CM

## 2013-06-04 MED ORDER — CEPHALEXIN 500 MG PO CAPS: 500.0000 mg | ORAL_CAPSULE | Freq: Four times a day (QID) | ORAL | Status: DC

## 2013-06-04 NOTE — Telephone Encounter (Signed)
Told her that Dr. Marko Plume ordered Keflex 500 mg qid x 7 days.  She is to call the office or go to the ED if if temp increases above 101.8 and or if she feels worse.  She is to keep lab appointment on 06-06-13 as scheduled. Keflex prescription sent to pharmacy.

## 2013-06-04 NOTE — Telephone Encounter (Signed)
Monique [name] states that the right thigh has a 7 inch long red area and the right thigh has a 3 inch  Long red area along the vein.  Both areas are on the inner aspect of each thigh.   She has been applying warm compressess.   Temp 100.5 to 101.8.  No sheking chills bu feels chilly.  No n/v or diarrhea.   Encouraged her to drink 8 oz of fluid every 1-1/2 hours. Told her the results of the Ca-125 as noted below by  Dr. Marko Plume.

## 2013-06-04 NOTE — Telephone Encounter (Signed)
Message copied by Baruch Merl on Wed Jun 04, 2013 11:57 AM ------      Message from: Gordy Levan      Created: Wed Jun 04, 2013 11:13 AM       Labs seen and need follow up: please let her know ca125 marker down to 1922. ------

## 2013-06-06 ENCOUNTER — Telehealth: Payer: Self-pay

## 2013-06-06 ENCOUNTER — Telehealth: Payer: Self-pay | Admitting: Dietician

## 2013-06-06 ENCOUNTER — Ambulatory Visit (HOSPITAL_BASED_OUTPATIENT_CLINIC_OR_DEPARTMENT_OTHER): Payer: Medicare Other

## 2013-06-06 VITALS — BP 115/61 | HR 105 | Temp 100.2°F | Resp 16

## 2013-06-06 DIAGNOSIS — C569 Malignant neoplasm of unspecified ovary: Secondary | ICD-10-CM

## 2013-06-06 DIAGNOSIS — I8003 Phlebitis and thrombophlebitis of superficial vessels of lower extremities, bilateral: Secondary | ICD-10-CM

## 2013-06-06 DIAGNOSIS — I8000 Phlebitis and thrombophlebitis of superficial vessels of unspecified lower extremity: Secondary | ICD-10-CM

## 2013-06-06 DIAGNOSIS — Z86718 Personal history of other venous thrombosis and embolism: Secondary | ICD-10-CM

## 2013-06-06 DIAGNOSIS — Z79899 Other long term (current) drug therapy: Secondary | ICD-10-CM

## 2013-06-06 LAB — PROTIME-INR
INR: 2 (ref 2.00–3.50)
PROTIME: 24 s — AB (ref 10.6–13.4)

## 2013-06-06 LAB — CBC WITH DIFFERENTIAL/PLATELET
BASO%: 0.1 % (ref 0.0–2.0)
Basophils Absolute: 0.1 10*3/uL (ref 0.0–0.1)
EOS%: 0.2 % (ref 0.0–7.0)
Eosinophils Absolute: 0.1 10*3/uL (ref 0.0–0.5)
HCT: 29 % — ABNORMAL LOW (ref 34.8–46.6)
HGB: 9.5 g/dL — ABNORMAL LOW (ref 11.6–15.9)
LYMPH#: 1.5 10*3/uL (ref 0.9–3.3)
LYMPH%: 4.4 % — ABNORMAL LOW (ref 14.0–49.7)
MCH: 30.5 pg (ref 25.1–34.0)
MCHC: 32.8 g/dL (ref 31.5–36.0)
MCV: 93.2 fL (ref 79.5–101.0)
MONO#: 2.5 10*3/uL — ABNORMAL HIGH (ref 0.1–0.9)
MONO%: 7.4 % (ref 0.0–14.0)
NEUT%: 87.9 % — ABNORMAL HIGH (ref 38.4–76.8)
NEUTROS ABS: 29.6 10*3/uL — AB (ref 1.5–6.5)
Platelets: 108 10*3/uL — ABNORMAL LOW (ref 145–400)
RBC: 3.11 10*6/uL — ABNORMAL LOW (ref 3.70–5.45)
RDW: 18.9 % — AB (ref 11.2–14.5)
WBC: 33.7 10*3/uL — ABNORMAL HIGH (ref 3.9–10.3)

## 2013-06-06 NOTE — Telephone Encounter (Signed)
Monique [name] here for pt/inr and cbc. Dr. Marko Plume said for her to continue coukadin 5mg  daily.  Plts. up to 108K.  No bleeding reported.  Right thig vein hard to the touch.  Area a dull red.  The left thig the wein is hard with slight red discoloration. Pt. with a runny nose. Clear drainage.  Temp 100.2 today.  Pt. On keflex.  Dr. Marko Plume will follow up with her on 06-09-13. Monique [name] knows to call if temp is 101 or higher.  Pt. Verbalized understanding.  Updated  appointment schedule given to Monique [name].

## 2013-06-06 NOTE — Telephone Encounter (Signed)
Brief Outpatient Oncology Nutrition Note  Patient has been identified to be at risk on malnutrition screen.  Wt Readings from Last 10 Encounters:  06/02/13 150 lb 3.2 oz (68.13 kg)  05/29/13 148 lb 1.6 oz (67.178 kg)  05/16/13 153 lb (69.4 kg)  05/14/13 152 lb 12.8 oz (69.31 kg)  05/13/13 154 lb 8 oz (70.081 kg)  04/28/13 149 lb 3.2 oz (67.677 kg)  03/31/13 145 lb (65.772 kg)  03/11/13 143 lb (64.864 kg)  03/10/13 141 lb 12.8 oz (64.32 kg)  03/05/13 143 lb 6.4 oz (65.046 kg)    Dx:  Ovarian Cancer  Called patient who reports that she is eating well with good appetite and has no needs at this time.  Eugene RD available as needed.  Antonieta Iba, RD, LDN

## 2013-06-09 ENCOUNTER — Ambulatory Visit (HOSPITAL_BASED_OUTPATIENT_CLINIC_OR_DEPARTMENT_OTHER): Payer: Medicare Other | Admitting: Oncology

## 2013-06-09 ENCOUNTER — Other Ambulatory Visit: Payer: Self-pay | Admitting: Oncology

## 2013-06-09 ENCOUNTER — Encounter: Payer: Self-pay | Admitting: Oncology

## 2013-06-09 ENCOUNTER — Other Ambulatory Visit (HOSPITAL_BASED_OUTPATIENT_CLINIC_OR_DEPARTMENT_OTHER): Payer: Medicare Other

## 2013-06-09 VITALS — BP 122/80 | HR 101 | Temp 97.6°F | Resp 20 | Ht 67.0 in | Wt 145.5 lb

## 2013-06-09 DIAGNOSIS — C569 Malignant neoplasm of unspecified ovary: Secondary | ICD-10-CM

## 2013-06-09 DIAGNOSIS — I8003 Phlebitis and thrombophlebitis of superficial vessels of lower extremities, bilateral: Secondary | ICD-10-CM

## 2013-06-09 DIAGNOSIS — Z86718 Personal history of other venous thrombosis and embolism: Secondary | ICD-10-CM

## 2013-06-09 DIAGNOSIS — Z79899 Other long term (current) drug therapy: Secondary | ICD-10-CM

## 2013-06-09 DIAGNOSIS — R21 Rash and other nonspecific skin eruption: Secondary | ICD-10-CM

## 2013-06-09 DIAGNOSIS — I428 Other cardiomyopathies: Secondary | ICD-10-CM

## 2013-06-09 LAB — CBC WITH DIFFERENTIAL/PLATELET
BASO%: 0.2 % (ref 0.0–2.0)
Basophils Absolute: 0 10*3/uL (ref 0.0–0.1)
EOS%: 0.4 % (ref 0.0–7.0)
Eosinophils Absolute: 0.1 10*3/uL (ref 0.0–0.5)
HCT: 26.2 % — ABNORMAL LOW (ref 34.8–46.6)
HGB: 8.5 g/dL — ABNORMAL LOW (ref 11.6–15.9)
LYMPH%: 7.5 % — AB (ref 14.0–49.7)
MCH: 30.1 pg (ref 25.1–34.0)
MCHC: 32.4 g/dL (ref 31.5–36.0)
MCV: 92.9 fL (ref 79.5–101.0)
MONO#: 1.2 10*3/uL — AB (ref 0.1–0.9)
MONO%: 7.2 % (ref 0.0–14.0)
NEUT#: 14.3 10*3/uL — ABNORMAL HIGH (ref 1.5–6.5)
NEUT%: 84.7 % — ABNORMAL HIGH (ref 38.4–76.8)
NRBC: 0 % (ref 0–0)
PLATELETS: 239 10*3/uL (ref 145–400)
RBC: 2.82 10*6/uL — AB (ref 3.70–5.45)
RDW: 18.6 % — AB (ref 11.2–14.5)
WBC: 16.9 10*3/uL — ABNORMAL HIGH (ref 3.9–10.3)
lymph#: 1.3 10*3/uL (ref 0.9–3.3)

## 2013-06-09 LAB — PROTIME-INR
INR: 4.8 — ABNORMAL HIGH (ref 2.00–3.50)
Protime: 57.6 Seconds — ABNORMAL HIGH (ref 10.6–13.4)

## 2013-06-09 MED ORDER — HYDROCORTISONE 2.5 % EX CREA: TOPICAL_CREAM | CUTANEOUS | Status: DC

## 2013-06-09 MED ORDER — HYDROCORTISONE 1 % EX CREA: 1.0000 "application " | TOPICAL_CREAM | Freq: Two times a day (BID) | CUTANEOUS | Status: DC

## 2013-06-09 NOTE — Patient Instructions (Signed)
No coumadin today, fine to eat salad today. Coumadin 2.5 mg (=1/2 of 5 mg tablet on 2-17) and 5 mg on 2-28. We will recheck on 2-19.  Use hydrocortisone cream when you pick that up, then twice daily starting day of chemo for next several days. Use the Sarna lotion regularly starting on or before chemo.  Let us know how legs are when you are finished Keflex.   We will need to delay chemo if fever then.

## 2013-06-09 NOTE — Progress Notes (Signed)
OFFICE PROGRESS NOTE   06/09/2013   Physicians:P.Clayton Bibles, MD (PCP [facility])/ [place], Daneen Schick, Antony Contras   INTERVAL HISTORY:  Patient is seen, alone for visit, in continuing attention to metastatic ovarian cancer to liver, now on third line treatment with Alimta. First cycle of Alimta was 05-22-13 and she had neulasta on 05-29-13. She tolerated first Alimta fairly well other than puritic rash, now improved. Course since diagnosis in 09-9676 has been complicated by DVT/PE initially, multiple embolic CVAs 12-3808 and 04-7508, unexpected EF of 15%, recurrent superficial thrombophlebitis, and progression while on taxol and carboplatin.  She has had 2 areas of recurrent superficial thrombophlebitis bilateral LE this week, with coumadin supratherapeutic today. She has been on Keflex since 2-13 and still on ASA. She had fever 100.5 - 101.8 on 2-11, none since. No symptoms of DVT, no bleeding, no new neurologic symptoms. The central chest rash is noticeable but not symptomatic now. Appetite is reasonable, bowels are moving well, no abdominal or pelvic distension or discomfort.  She has IVC filter. She does not have central catheter now.   ONCOLOGIC HISTORY   Ovarian cancer   06/12/2012 Initial Diagnosis Ovarian cancer   06/21/2012 - 08/16/2012 Chemotherapy dose dense paclitaxel and carboplatin   09/10/2012 Surgery suboptimal debulking    Chemotherapy liposomal dox planned but EF 15%   12/02/2012 -  Chemotherapy gemcitabine  Patient developed abdominal symptoms in May 2013, saw GI Aug 2013, but was unable to tolerate prep for colonoscopy. She developed LLE superficial phlebitis and swelling in Jan 2014 , with acute thrombus left common femoral vein.  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 She was begun on lovenox and transitioned to Xarelto by PCP. CA 125 was 569. She saw Dr Alycia Rossetti on 06-12-12, with 15 cm fixed pelvic mass.  US paracentesis by IR 06-12-12 for 2.8 liters had cytology ( NZB14 - 126) positive for adenocarcinoma. Neoadjuvant dose dense carbo/taxol was given  x 3 cycles  06-21-12 thru 08-16-12, with good partial response by CT 08-19-12 and CA 125 down from 1041 in Feb 2014 to 92 on 08-21-12. IVC filter placed prior to exploratory laparotomy with extensive lysis of adhesions, left ureteral lysis, and bilateral salpingo-oophorectomy by Dr Alycia Rossetti on 09-10-12, suboptimal debulking. Operative findings:diffuse carcinomatosis of the residual omentum involving the entire transverse colon. Transverse colon densely adherent to the anterior abdominal wall. No disease throughout the small bowel. Mesenteric adenopathy with lymph nodes measuring 1 cm, involving the root of the small bowel mesentery. ~5 centimeter right ovarian mass and ~ 6 cm left ovarian mass. Small bowel densely adherent to the pelvis. 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 Chemotherapy resumed with dose dense taxol carboplatin on 09-27-12, completing cycle 5 on 11-08-12. CA 125 increased from 92 in April to 130 post op in June, then 225 late July and repeat 318. CT CAP done 11-19-12 showed increase in nodular thickening along  falciform ligament and small peritoneal nodule right iliac fossa. Plan was to change in chemotherapy to doxil with avastin, however echocardiogram 11-27-12 unexpectedly had EF of 15%. She  received one cycle of gemzar on 12-02-12, given peripherally, then had embolic CVAs on 05-23-84 while still on xarelto, with bleeding into the embolic CVA , and treated with heparin transitioned to coumadin. She was stable to resume gemzar with cycle 2 at 800 mg/m2 given on 12-30-12,then platelets down to 70k by day 8. She had progressive superficial thrombophlebitis LE and was changed to full dose lovenox then; there was difficulty getting lovenox covered by insurance. Cycle 3 gemzar 01-13-13 was dose reduced to 600 mg/m2, however platelets still dropped to 90k; she had recurrent embolic CVAs on 5-78-46 and was hospitalized thru 01-28-13, treated with continuous heparin transitioned back to coumadin, plus baby ASA daily. Gemzar was subsequently tolerated well at 500 mg/m2, without significant thrombocytopenia, total 10 cycles given thru 04-28-13. She progressed in liver and nodes by CT 05-13-13. She wanted to continue treatment, with Alimta chosen due to comorbidities,  begun 05-22-13. Platelets were 43k by day 9 cycle 1; neulasta was given day 8.   Review of systems as above, also: No increased SOB. No chest pain. Slight clear rhinorrhea seems to be environmental allergies. No other localizing symptoms of infection. The superficial cords seemed to be improving until past 12 hours, when slightly increased erythema distally on right.  Remainder of 10 point Review of Systems negative.  Objective:  Vital signs in last 24 hours:  BP 122/80  Pulse 101  Temp(Src) 97.6 F (36.4 C) (Oral)  Resp 20  Ht 5\' 7"  (1.702 m)  Wt 145 lb 8 oz (65.998 kg)  BMI 22.78 kg/m2  Alert, oriented and appropriate. Ambulatory without difficulty.  Alopecia  HEENT:PERRL, sclerae not icteric. Oral mucosa moist without lesions, posterior pharynx clear.  Neck supple. No JVD.  Lymphatics:no cervical,suraclavicular or inguinal adenopathy Resp: clear to auscultation bilaterally and normal percussion bilaterally Cardio: regular rate  and rhythm. No gallop. GI: soft, nontender, not distended, no mass or organomegaly. Normal bowel sounds. Surgical incision not remarkable. Musculoskeletal/ Extremities: without pitting edema. Superficial cord right medial lower thigh ~ 9 cm long, not hot or tender and dull erythema other than 2 cm just above knee more erythema and slightly tender. Much smaller area not hot or tender left medial thigh. Other varicosities not changed. Neuro: Speech fluent. No peripheral neuropathy. Otherwise nonfocal Skin minimal rash scattered anterior chest not palpable.  Ecchymosis 2 cm right forearm, no petechiae  Lab Results:  Results for orders placed in visit on 06/09/13  CBC WITH DIFFERENTIAL      Result Value Ref Range   WBC 16.9 (*) 3.9 - 10.3 10e3/uL   NEUT# 14.3 (*) 1.5 - 6.5 10e3/uL   HGB 8.5 (*) 11.6 - 15.9 g/dL   HCT 26.2 (*) 34.8 - 46.6 %   Platelets 239  145 - 400 10e3/uL   MCV 92.9  79.5 - 101.0 fL   MCH 30.1  25.1 - 34.0 pg   MCHC 32.4  31.5 - 36.0 g/dL   RBC 2.82 (*) 3.70 - 5.45 10e6/uL   RDW 18.6 (*) 11.2 - 14.5 %   lymph# 1.3  0.9 - 3.3 10e3/uL   MONO# 1.2 (*) 0.1 - 0.9 10e3/uL   Eosinophils Absolute 0.1  0.0 - 0.5 10e3/uL   Basophils Absolute 0.0  0.0 - 0.1 10e3/uL   NEUT% 84.7 (*) 38.4 - 76.8 %  LYMPH% 7.5 (*) 14.0 - 49.7 %   MONO% 7.2  0.0 - 14.0 %   EOS% 0.4  0.0 - 7.0 %   BASO% 0.2  0.0 - 2.0 %   nRBC 0  0 - 0 %  PROTIME-INR      Result Value Ref Range   Protime 57.6 (*) 10.6 - 13.4 Seconds   INR 4.80 (*) 2.00 - 3.50   Lovenox No      CA 125 down to 1922 on 06-02-13, tho may not be accurate.  Studies/Results:  No results found.  Medications: I have reviewed the patient's current medications. Hold coumadin today, take 2.5 mg on 2-17, 5 mg on 2-18 and repeat INR 2-19. Complete Keflex. If cannot control with coumadin, would try SQ heparin (unless new insurance covers lovenox). She will use hydrocortisone cream 1% bid if any rash, as well as Sarna lotion. Continue daily  folate. B12 given 05-16-13.  DISCUSSION Patient wants to continue alimta and is aware that further cycles will be dose reduced because of thrombocytopenia cycle 1.   Assessment/Plan: 1. 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 Cycle 1 Alimta [id number] complicated by puritic rash despite decadron, leukopenia and platelets 43k .  Cycle 2 alimta on 06-12-13 with dose reduction, and  neulasta 2-20. I will see her 2-23 and 3-11 prior to cycle 3 due 3-12.  2.embolic CVAs fall [id number] and recurrent episodes of superficial thrombophlebitis. IVC filter in. Liver mets likely will increase sensitivity to coumadin; she has previously had rapid recurrence of superficial thrombophlebitis off anticoagulation. Plan as above 3.cardiomyopathy: EF 15%, followed by Dr Daneen Schick  4.DNR patient's request but still treating in attempt to control disease. HCPOA is friend Clair Gulling. 5.long past tobacco  6.flu vaccine done  7.PICC out     Gordy Levan, MD   06/09/2013, 2:55 PM

## 2013-06-12 ENCOUNTER — Other Ambulatory Visit (HOSPITAL_BASED_OUTPATIENT_CLINIC_OR_DEPARTMENT_OTHER): Payer: Medicare Other

## 2013-06-12 ENCOUNTER — Ambulatory Visit (HOSPITAL_BASED_OUTPATIENT_CLINIC_OR_DEPARTMENT_OTHER): Payer: Self-pay | Admitting: Pharmacist

## 2013-06-12 ENCOUNTER — Other Ambulatory Visit: Payer: Self-pay | Admitting: Oncology

## 2013-06-12 ENCOUNTER — Ambulatory Visit (HOSPITAL_BASED_OUTPATIENT_CLINIC_OR_DEPARTMENT_OTHER): Payer: Medicare Other

## 2013-06-12 VITALS — BP 113/72 | HR 79 | Temp 98.0°F | Resp 20

## 2013-06-12 DIAGNOSIS — Z5111 Encounter for antineoplastic chemotherapy: Secondary | ICD-10-CM

## 2013-06-12 DIAGNOSIS — Z86718 Personal history of other venous thrombosis and embolism: Secondary | ICD-10-CM

## 2013-06-12 DIAGNOSIS — C569 Malignant neoplasm of unspecified ovary: Secondary | ICD-10-CM

## 2013-06-12 DIAGNOSIS — I8000 Phlebitis and thrombophlebitis of superficial vessels of unspecified lower extremity: Secondary | ICD-10-CM

## 2013-06-12 DIAGNOSIS — I8003 Phlebitis and thrombophlebitis of superficial vessels of lower extremities, bilateral: Secondary | ICD-10-CM

## 2013-06-12 DIAGNOSIS — Z79899 Other long term (current) drug therapy: Secondary | ICD-10-CM

## 2013-06-12 LAB — CBC WITH DIFFERENTIAL/PLATELET
BASO%: 0.1 % (ref 0.0–2.0)
Basophils Absolute: 0 10*3/uL (ref 0.0–0.1)
EOS%: 0 % (ref 0.0–7.0)
Eosinophils Absolute: 0 10*3/uL (ref 0.0–0.5)
HCT: 26.5 % — ABNORMAL LOW (ref 34.8–46.6)
HGB: 8.4 g/dL — ABNORMAL LOW (ref 11.6–15.9)
LYMPH#: 0.8 10*3/uL — AB (ref 0.9–3.3)
LYMPH%: 5.3 % — AB (ref 14.0–49.7)
MCH: 30.1 pg (ref 25.1–34.0)
MCHC: 31.7 g/dL (ref 31.5–36.0)
MCV: 95 fL (ref 79.5–101.0)
MONO#: 0.7 10*3/uL (ref 0.1–0.9)
MONO%: 4.7 % (ref 0.0–14.0)
NEUT#: 12.8 10*3/uL — ABNORMAL HIGH (ref 1.5–6.5)
NEUT%: 89.9 % — ABNORMAL HIGH (ref 38.4–76.8)
PLATELETS: 282 10*3/uL (ref 145–400)
RBC: 2.79 10*6/uL — ABNORMAL LOW (ref 3.70–5.45)
RDW: 18.8 % — ABNORMAL HIGH (ref 11.2–14.5)
WBC: 14.2 10*3/uL — ABNORMAL HIGH (ref 3.9–10.3)
nRBC: 0 % (ref 0–0)

## 2013-06-12 LAB — POCT INR: INR: 3.1

## 2013-06-12 LAB — PROTIME-INR
INR: 3.1 (ref 2.00–3.50)
Protime: 37.2 Seconds — ABNORMAL HIGH (ref 10.6–13.4)

## 2013-06-12 MED ORDER — SODIUM CHLORIDE 0.9 % IV SOLN
375.0000 mg/m2 | Freq: Once | INTRAVENOUS | Status: AC
  Administered 2013-06-12: 675 mg via INTRAVENOUS
  Filled 2013-06-12: qty 27

## 2013-06-12 MED ORDER — DEXAMETHASONE SODIUM PHOSPHATE 10 MG/ML IJ SOLN
INTRAMUSCULAR | Status: AC
  Filled 2013-06-12: qty 1

## 2013-06-12 MED ORDER — ONDANSETRON 8 MG/50ML IVPB (CHCC)
8.0000 mg | Freq: Once | INTRAVENOUS | Status: AC
  Administered 2013-06-12: 8 mg via INTRAVENOUS

## 2013-06-12 MED ORDER — DEXAMETHASONE SODIUM PHOSPHATE 10 MG/ML IJ SOLN
10.0000 mg | Freq: Once | INTRAMUSCULAR | Status: AC
  Administered 2013-06-12: 10 mg via INTRAVENOUS

## 2013-06-12 MED ORDER — SODIUM CHLORIDE 0.9 % IV SOLN
Freq: Once | INTRAVENOUS | Status: AC
  Administered 2013-06-12: 12:00:00 via INTRAVENOUS

## 2013-06-12 MED ORDER — ONDANSETRON 8 MG/NS 50 ML IVPB
INTRAVENOUS | Status: AC
  Filled 2013-06-12: qty 8

## 2013-06-12 NOTE — Patient Instructions (Signed)
[facility] Discharge Instructions for Patients Receiving Chemotherapy  Today you received the following chemotherapy agents alimta.  To help prevent nausea and vomiting after your treatment, we encourage you to take your nausea medication zofran.   If you develop nausea and vomiting that is not controlled by your nausea medication, call the clinic.   BELOW ARE SYMPTOMS THAT SHOULD BE REPORTED IMMEDIATELY:  *FEVER GREATER THAN 100.5 F  *CHILLS WITH OR WITHOUT FEVER  NAUSEA AND VOMITING THAT IS NOT CONTROLLED WITH YOUR NAUSEA MEDICATION  *UNUSUAL SHORTNESS OF BREATH  *UNUSUAL BRUISING OR BLEEDING  TENDERNESS IN MOUTH AND THROAT WITH OR WITHOUT PRESENCE OF ULCERS  *URINARY PROBLEMS  *BOWEL PROBLEMS  UNUSUAL RASH Items with * indicate a potential emergency and should be followed up as soon as possible.  Feel free to call the clinic you have any questions or concerns. The clinic phone number is [phone].    

## 2013-06-12 NOTE — Progress Notes (Signed)
PT seen during infusion today INR=3.1 on 5mg  daily with 2.5 mg on Mondays Continue same dose We will check INR/PT with next infusion on 3/12 Pt states she just finished her course of Keflex last night

## 2013-06-12 NOTE — Patient Instructions (Signed)
Continue coumadin 5 mg daily with 2.5 mg on Mondays repeat INR on 3/12 with infusion appmt

## 2013-06-13 ENCOUNTER — Other Ambulatory Visit: Payer: Self-pay | Admitting: Oncology

## 2013-06-13 ENCOUNTER — Ambulatory Visit (HOSPITAL_BASED_OUTPATIENT_CLINIC_OR_DEPARTMENT_OTHER): Payer: Medicare Other

## 2013-06-13 VITALS — BP 121/79 | HR 94 | Temp 97.5°F

## 2013-06-13 DIAGNOSIS — C569 Malignant neoplasm of unspecified ovary: Secondary | ICD-10-CM

## 2013-06-13 DIAGNOSIS — Z5189 Encounter for other specified aftercare: Secondary | ICD-10-CM

## 2013-06-13 MED ORDER — PEGFILGRASTIM INJECTION 6 MG/0.6ML
6.0000 mg | Freq: Once | SUBCUTANEOUS | Status: AC
Start: 2013-06-13 — End: 2013-06-13
  Administered 2013-06-13: 6 mg via SUBCUTANEOUS
  Filled 2013-06-13: qty 0.6

## 2013-06-16 ENCOUNTER — Encounter: Payer: Self-pay | Admitting: Oncology

## 2013-06-16 ENCOUNTER — Other Ambulatory Visit (HOSPITAL_BASED_OUTPATIENT_CLINIC_OR_DEPARTMENT_OTHER): Payer: Medicare Other

## 2013-06-16 ENCOUNTER — Ambulatory Visit (HOSPITAL_BASED_OUTPATIENT_CLINIC_OR_DEPARTMENT_OTHER): Payer: Medicare Other | Admitting: Oncology

## 2013-06-16 VITALS — BP 121/78 | HR 93 | Temp 98.1°F | Resp 18 | Ht 67.0 in | Wt 147.3 lb

## 2013-06-16 DIAGNOSIS — I8003 Phlebitis and thrombophlebitis of superficial vessels of lower extremities, bilateral: Secondary | ICD-10-CM

## 2013-06-16 DIAGNOSIS — Z86718 Personal history of other venous thrombosis and embolism: Secondary | ICD-10-CM

## 2013-06-16 DIAGNOSIS — C787 Secondary malignant neoplasm of liver and intrahepatic bile duct: Secondary | ICD-10-CM

## 2013-06-16 DIAGNOSIS — I8000 Phlebitis and thrombophlebitis of superficial vessels of unspecified lower extremity: Secondary | ICD-10-CM

## 2013-06-16 DIAGNOSIS — C569 Malignant neoplasm of unspecified ovary: Secondary | ICD-10-CM

## 2013-06-16 DIAGNOSIS — Z79899 Other long term (current) drug therapy: Secondary | ICD-10-CM

## 2013-06-16 DIAGNOSIS — I63432 Cerebral infarction due to embolism of left posterior cerebral artery: Secondary | ICD-10-CM

## 2013-06-16 LAB — CBC WITH DIFFERENTIAL/PLATELET
BASO%: 0.2 % (ref 0.0–2.0)
Basophils Absolute: 0.1 10*3/uL (ref 0.0–0.1)
EOS%: 0.2 % (ref 0.0–7.0)
Eosinophils Absolute: 0.1 10*3/uL (ref 0.0–0.5)
HCT: 28.2 % — ABNORMAL LOW (ref 34.8–46.6)
HGB: 9.1 g/dL — ABNORMAL LOW (ref 11.6–15.9)
LYMPH%: 3.6 % — ABNORMAL LOW (ref 14.0–49.7)
MCH: 30.3 pg (ref 25.1–34.0)
MCHC: 32.3 g/dL (ref 31.5–36.0)
MCV: 94 fL (ref 79.5–101.0)
MONO#: 0.1 10*3/uL (ref 0.1–0.9)
MONO%: 0.4 % (ref 0.0–14.0)
NEUT#: 28.6 10*3/uL — ABNORMAL HIGH (ref 1.5–6.5)
NEUT%: 95.6 % — ABNORMAL HIGH (ref 38.4–76.8)
Platelets: 258 10*3/uL (ref 145–400)
RBC: 3 10*6/uL — AB (ref 3.70–5.45)
RDW: 19.2 % — ABNORMAL HIGH (ref 11.2–14.5)
WBC: 29.9 10*3/uL — AB (ref 3.9–10.3)
lymph#: 1.1 10*3/uL (ref 0.9–3.3)

## 2013-06-16 LAB — TECHNOLOGIST REVIEW

## 2013-06-16 LAB — PROTIME-INR
INR: 4.7 — ABNORMAL HIGH (ref 2.00–3.50)
PROTIME: 56.4 s — AB (ref 10.6–13.4)

## 2013-06-16 NOTE — Progress Notes (Signed)
OFFICE PROGRESS NOTE   06/16/2013   Physicians:P.Clayton Bibles, MD (PCP [facility])/ [place], Daneen Schick, Antony Contras   INTERVAL HISTORY:  Patient is seen, alone for visit, in continuing attention to recurrent ovarian cancer for which she is now on treatment with Alimta. 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 Cycle 1 Alimta 07-21-90 was complicated by platelets 43k and severe puritic rash on chest; Cycle 2 Alimta was given on 06-12-13, dose reduced by 25% due to thrombocytopenia cycle 1. Patient has tolerated this second treatment well thus far, with slight rash not puritic upper left chest now. She has had minimal nausea, no abdominal or pelvic discomfort. Bowels are moving. She had neulasta on 06-13-13.  She has just completed Keflex for most recent exacerbation of LE thrombophlebitis. She has residual slight cords, nontender, and discoloration medial thighs, but no active symptoms from this problem. She has had no significant bleeding.  She has no central catheter since PICC used for gemzar was DCd.   ONCOLOGIC HISTORY   Ovarian cancer   06/12/2012 Initial Diagnosis Ovarian cancer   06/21/2012 - 08/16/2012 Chemotherapy dose dense paclitaxel and carboplatin   09/10/2012 Surgery suboptimal debulking    Chemotherapy liposomal dox planned but EF 15%   12/02/2012 -  Chemotherapy gemcitabine  Patient developed abdominal symptoms in May 2013, saw GI Aug 2013, but was unable to tolerate prep for colonoscopy. She developed LLE superficial phlebitis and swelling in Jan 2014 , with acute thrombus left common femoral vein. 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 She was begun on lovenox and transitioned to Xarelto by PCP. CA 125 was 569. She saw Dr Alycia Rossetti on 06-12-12, with 15 cm fixed pelvic mass. US paracentesis by IR 06-12-12 for 2.8 liters had cytology ( [id number] - 126) positive for adenocarcinoma. Neoadjuvant dose dense carbo/taxol was given x 3 cycles 06-21-12 thru 08-16-12, with good partial response by CT 08-19-12 and CA 125 down from 1041 in Feb 2014 to 92 on 08-21-12. IVC filter placed prior to exploratory laparotomy with extensive lysis of adhesions, left ureteral lysis, and bilateral salpingo-oophorectomy by Dr Alycia Rossetti on 09-10-12, suboptimal debulking. Operative findings:diffuse carcinomatosis of the residual omentum involving the entire transverse colon. Transverse colon densely adherent to the anterior abdominal wall. No disease throughout the small bowel. Mesenteric adenopathy with lymph nodes measuring 1 cm, involving the root of the small bowel mesentery. ~5 centimeter right ovarian mass and ~ 6 cm left ovarian mass. Small bowel densely adherent to the pelvis. 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 Chemotherapy resumed with dose dense taxol carboplatin on 09-27-12, completing cycle 5 on 11-08-12. CA 125 increased from 92 in April to 130 post op in June, then 225 late July and repeat 318. CT CAP done 11-19-12 showed increase in nodular thickening along falciform ligament and small peritoneal nodule right iliac fossa. Plan was to change in  chemotherapy to doxil with avastin, however echocardiogram 11-27-12 unexpectedly had EF of 15%. She received one cycle of gemzar on 12-02-12, given peripherally, then  had embolic CVAs on [id number] while still on xarelto, with bleeding into the embolic CVA , and treated with heparin transitioned to coumadin. She was stable to resume gemzar with cycle 2 at 800 mg/m2 given on 12-30-12,then platelets down to 70k by day 8. She had progressive superficial thrombophlebitis LE and was changed to full dose lovenox then; there was difficulty getting lovenox covered by insurance. Cycle 3 gemzar 01-13-13 was dose reduced to 600 mg/m2, however platelets still dropped to 90k; she had recurrent embolic CVAs on [id number] and was hospitalized thru 01-28-13, treated with continuous heparin transitioned back to coumadin, plus baby ASA daily. Gemzar was subsequently tolerated well at 500 mg/m2, without significant thrombocytopenia, total 10 cycles given thru 04-28-13. She progressed in liver and nodes by CT 05-13-13. She wanted to continue treatment, with Alimta chosen due to comorbidities, begun 05-22-13. Platelets were 43k by day 9 cycle 1; neulasta was given day 8. Cycle 2 Alimta was dose reduced by 25% due to platelet nadir.   Review of systems as above, also: No increased SOB, no chest pain, no LE swelling. No new neurologic symptoms. Discoloration and SQ tiny bleeds on index fingers bilaterally from using difficult key. Remainder of 10 point Review of Systems negative.  Objective:  Vital signs in last 24 hours:  BP 121/78  Pulse 93  Temp(Src) 98.1 F (36.7 C) (Oral)  Resp 18  Ht 5\' 7"  (1.702 m)  Wt 147 lb 4.8 oz (66.815 kg)  BMI 23.07 kg/m2 Weight is up 2 lbs. Alert, oriented and appropriate. Ambulatory without assistance.  Alopecia  HEENT:PERRL, sclerae not icteric. Oral mucosa moist without lesions, posterior pharynx clear.  Neck supple. No JVD.  Lymphatics:no cervical,suraclavicular or inguinal adenopathy Resp: clear to auscultation bilaterally and normal percussion bilaterally Cardio: regular rate and rhythm. No gallop. GI: soft, nontender, not distended, no mass or  organomegaly. Some bowel sounds. Surgical incision not remarkable. Musculoskeletal/ Extremities: without pitting edema, cords, tenderness. Slight nontender cord extending over ~ 10 cm medial right thigh with dull discoloration. Prominent superficial varicosities bilaterally.  Neuro: no peripheral neuropathy. Otherwise nonfocal Skin palpable erythematous rash anterior left upper chest and scattered areas across anterior chest centrally. Bluish discoloration and tiny SQ hemorrages index fingers distally R>L   Lab Results:  CBC today WBC 29.9, ANC 28.6 post neulasta 2-20. Hgb 9.1, plt 258   INR today 4.7    Studies/Results:  No results found.  Medications: I have reviewed the patient's current medications. She has been given written and oral instructions for coumadin: hold coumadin and continue holding ASA today. Resume coumadin with 2.5 mg on 2-24, 5 mg on 2-25 and will recheck thru coumadin clinic on 2-26. Note she tends to drop INR quickly with held doses, has just completed Keflex, has new liver mets.   DISCUSSION: as long as she is clinically stable and tolerating Alimta, will probably repeat scans after 3-4 cycles. CA 125 will be repeated 3-11.   Assessment/Plan:  1. 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 Cycle 1 Alimta [id number] complicated by puritic rash despite decadron, leukopenia and platelets 43k . Cycle 2 alimta on 06-12-13 with dose reduction, and neulasta 2-20. She will have CBC with coumadin clinic visit on 06-26-13.  I will see her 3-11 prior to cycle 3 due  3-20.  2.embolic CVAs fall 2334 and recurrent episodes of superficial thrombophlebitis. IVC filter in. Liver mets likely will increase sensitivity to coumadin; she has previously had rapid recurrence of superficial thrombophlebitis off anticoagulation. Appreciate assistance from [facility] coumadin clinic on 2-26 and 06-26-13.   3.cardiomyopathy: EF  15%, followed by Dr Daneen Schick  4.DNR patient's request but still treating in attempt to control disease. HCPOA is friend Clair Gulling.  5.long past tobacco  6.flu vaccine done  7.PICC out       Gordy Levan, MD   06/16/2013, 8:09 PM

## 2013-06-16 NOTE — Telephone Encounter (Signed)
appts made and printed...td 

## 2013-06-16 NOTE — Patient Instructions (Signed)
COUMADIN:   None today, 2.5 mg 2-24, 5 mg 2-25,  recheck on 2-26.  Aspirin none today then resume 81 mg daily  Warm soaks to right thigh regularly today.

## 2013-06-19 ENCOUNTER — Ambulatory Visit (HOSPITAL_BASED_OUTPATIENT_CLINIC_OR_DEPARTMENT_OTHER): Payer: Medicare Other | Admitting: Pharmacist

## 2013-06-19 ENCOUNTER — Other Ambulatory Visit (HOSPITAL_BASED_OUTPATIENT_CLINIC_OR_DEPARTMENT_OTHER): Payer: Medicare Other

## 2013-06-19 DIAGNOSIS — Z86718 Personal history of other venous thrombosis and embolism: Secondary | ICD-10-CM

## 2013-06-19 DIAGNOSIS — I808 Phlebitis and thrombophlebitis of other sites: Secondary | ICD-10-CM

## 2013-06-19 DIAGNOSIS — I8003 Phlebitis and thrombophlebitis of superficial vessels of lower extremities, bilateral: Secondary | ICD-10-CM

## 2013-06-19 DIAGNOSIS — I428 Other cardiomyopathies: Secondary | ICD-10-CM

## 2013-06-19 DIAGNOSIS — I639 Cerebral infarction, unspecified: Secondary | ICD-10-CM

## 2013-06-19 DIAGNOSIS — I63432 Cerebral infarction due to embolism of left posterior cerebral artery: Secondary | ICD-10-CM

## 2013-06-19 LAB — PROTIME-INR
INR: 2 (ref 2.00–3.50)
Protime: 24 Seconds — ABNORMAL HIGH (ref 10.6–13.4)

## 2013-06-19 LAB — POCT INR: INR: 2

## 2013-06-19 NOTE — Patient Instructions (Signed)
Continue coumadin 5 mg daily with 2.5 mg on Mondays repeat INR on 3/5 with lab at 10:45 and clinic at 11:00

## 2013-06-19 NOTE — Progress Notes (Signed)
Pt seen in clinic today.  INR =2.0 after holding day and decreasing a day This decreased her weekly dose by 7.5 mg.   Will incorporate a 5mg  weekly decrease over next week total Will continue 5mg  daily and 2.5 mg on Monday She does have liver mets, so this could contribute to fluctuating INRs Return to clinic in one week on 3/5 at 10:45 lab and 11:00 CC Call before if anything is needed or she feels we need to see her sooner

## 2013-06-26 ENCOUNTER — Ambulatory Visit (HOSPITAL_BASED_OUTPATIENT_CLINIC_OR_DEPARTMENT_OTHER): Payer: Medicare Other | Admitting: Pharmacist

## 2013-06-26 ENCOUNTER — Other Ambulatory Visit (HOSPITAL_BASED_OUTPATIENT_CLINIC_OR_DEPARTMENT_OTHER): Payer: Medicare Other

## 2013-06-26 DIAGNOSIS — I808 Phlebitis and thrombophlebitis of other sites: Secondary | ICD-10-CM

## 2013-06-26 DIAGNOSIS — I63432 Cerebral infarction due to embolism of left posterior cerebral artery: Secondary | ICD-10-CM

## 2013-06-26 DIAGNOSIS — C569 Malignant neoplasm of unspecified ovary: Secondary | ICD-10-CM

## 2013-06-26 DIAGNOSIS — Z7901 Long term (current) use of anticoagulants: Secondary | ICD-10-CM

## 2013-06-26 DIAGNOSIS — I8003 Phlebitis and thrombophlebitis of superficial vessels of lower extremities, bilateral: Secondary | ICD-10-CM

## 2013-06-26 DIAGNOSIS — Z86718 Personal history of other venous thrombosis and embolism: Secondary | ICD-10-CM

## 2013-06-26 LAB — CBC WITH DIFFERENTIAL/PLATELET
BASO%: 0.2 % (ref 0.0–2.0)
Basophils Absolute: 0 10*3/uL (ref 0.0–0.1)
EOS ABS: 0.2 10*3/uL (ref 0.0–0.5)
EOS%: 1.7 % (ref 0.0–7.0)
HCT: 27.9 % — ABNORMAL LOW (ref 34.8–46.6)
HGB: 8.7 g/dL — ABNORMAL LOW (ref 11.6–15.9)
LYMPH#: 1.1 10*3/uL (ref 0.9–3.3)
LYMPH%: 11.3 % — AB (ref 14.0–49.7)
MCH: 30.3 pg (ref 25.1–34.0)
MCHC: 31.2 g/dL — ABNORMAL LOW (ref 31.5–36.0)
MCV: 97.2 fL (ref 79.5–101.0)
MONO#: 0.8 10*3/uL (ref 0.1–0.9)
MONO%: 9 % (ref 0.0–14.0)
NEUT%: 77.8 % — ABNORMAL HIGH (ref 38.4–76.8)
NEUTROS ABS: 7.2 10*3/uL — AB (ref 1.5–6.5)
Platelets: 206 10*3/uL (ref 145–400)
RBC: 2.87 10*6/uL — AB (ref 3.70–5.45)
RDW: 20.2 % — ABNORMAL HIGH (ref 11.2–14.5)
WBC: 9.3 10*3/uL (ref 3.9–10.3)

## 2013-06-26 LAB — POCT INR: INR: 2.4

## 2013-06-26 LAB — PROTIME-INR
INR: 2.4 (ref 2.00–3.50)
Protime: 28.8 Seconds — ABNORMAL HIGH (ref 10.6–13.4)

## 2013-06-26 NOTE — Progress Notes (Signed)
INR = 2.4    Goal 2-3 INR is within goal range. Patient has a bruise on the back of her calf where she ran into a stepstool.   I looked at bruise with patient, it is about an inch in diameter and is resolving. She has had no medication or dietary changes. She will continue Coumadin 5 mg daily except 2.5 mg on Monday. She will have labs drawn 3/11 prior to appt with Dr. Marko Plume. She already has an appointment to the see the Coumadin clinic pharmacist in the infusion area during chemotherapy on 3/12. She will call us if she has problems or questions in the meantime.  Theone Murdoch, PharmD

## 2013-07-01 ENCOUNTER — Other Ambulatory Visit: Payer: Self-pay | Admitting: Oncology

## 2013-07-02 ENCOUNTER — Ambulatory Visit (HOSPITAL_BASED_OUTPATIENT_CLINIC_OR_DEPARTMENT_OTHER): Payer: Medicare Other | Admitting: Oncology

## 2013-07-02 ENCOUNTER — Other Ambulatory Visit (HOSPITAL_BASED_OUTPATIENT_CLINIC_OR_DEPARTMENT_OTHER): Payer: Medicare Other

## 2013-07-02 ENCOUNTER — Telehealth: Payer: Self-pay | Admitting: *Deleted

## 2013-07-02 VITALS — BP 126/83 | HR 87 | Temp 98.4°F | Resp 18 | Ht 67.0 in | Wt 144.8 lb

## 2013-07-02 DIAGNOSIS — Z86718 Personal history of other venous thrombosis and embolism: Secondary | ICD-10-CM

## 2013-07-02 DIAGNOSIS — C569 Malignant neoplasm of unspecified ovary: Secondary | ICD-10-CM

## 2013-07-02 DIAGNOSIS — I776 Arteritis, unspecified: Secondary | ICD-10-CM

## 2013-07-02 DIAGNOSIS — I63432 Cerebral infarction due to embolism of left posterior cerebral artery: Secondary | ICD-10-CM

## 2013-07-02 DIAGNOSIS — D5912 Cold autoimmune hemolytic anemia: Secondary | ICD-10-CM

## 2013-07-02 DIAGNOSIS — D591 Other autoimmune hemolytic anemias: Secondary | ICD-10-CM

## 2013-07-02 DIAGNOSIS — D6859 Other primary thrombophilia: Secondary | ICD-10-CM

## 2013-07-02 DIAGNOSIS — I8003 Phlebitis and thrombophlebitis of superficial vessels of lower extremities, bilateral: Secondary | ICD-10-CM

## 2013-07-02 DIAGNOSIS — C779 Secondary and unspecified malignant neoplasm of lymph node, unspecified: Secondary | ICD-10-CM

## 2013-07-02 DIAGNOSIS — Z79899 Other long term (current) drug therapy: Secondary | ICD-10-CM

## 2013-07-02 DIAGNOSIS — C787 Secondary malignant neoplasm of liver and intrahepatic bile duct: Secondary | ICD-10-CM

## 2013-07-02 LAB — COMPREHENSIVE METABOLIC PANEL (CC13)
ALBUMIN: 2.9 g/dL — AB (ref 3.5–5.0)
ALT: 8 U/L (ref 0–55)
AST: 22 U/L (ref 5–34)
Alkaline Phosphatase: 147 U/L (ref 40–150)
Anion Gap: 8 mEq/L (ref 3–11)
BUN: 16 mg/dL (ref 7.0–26.0)
CHLORIDE: 106 meq/L (ref 98–109)
CO2: 25 meq/L (ref 22–29)
Calcium: 9.6 mg/dL (ref 8.4–10.4)
Creatinine: 0.9 mg/dL (ref 0.6–1.1)
Glucose: 97 mg/dl (ref 70–140)
POTASSIUM: 4.8 meq/L (ref 3.5–5.1)
Sodium: 138 mEq/L (ref 136–145)
Total Bilirubin: 0.22 mg/dL (ref 0.20–1.20)
Total Protein: 7.1 g/dL (ref 6.4–8.3)

## 2013-07-02 LAB — CBC WITH DIFFERENTIAL/PLATELET
BASO%: 1.1 % (ref 0.0–2.0)
BASOS ABS: 0.1 10*3/uL (ref 0.0–0.1)
EOS%: 3.7 % (ref 0.0–7.0)
Eosinophils Absolute: 0.3 10*3/uL (ref 0.0–0.5)
HCT: 28.7 % — ABNORMAL LOW (ref 34.8–46.6)
HEMOGLOBIN: 9.4 g/dL — AB (ref 11.6–15.9)
LYMPH#: 1 10*3/uL (ref 0.9–3.3)
LYMPH%: 13.9 % — ABNORMAL LOW (ref 14.0–49.7)
MCH: 31.9 pg (ref 25.1–34.0)
MCHC: 32.6 g/dL (ref 31.5–36.0)
MCV: 97.7 fL (ref 79.5–101.0)
MONO#: 1 10*3/uL — AB (ref 0.1–0.9)
MONO%: 14.3 % — ABNORMAL HIGH (ref 0.0–14.0)
NEUT%: 67 % (ref 38.4–76.8)
NEUTROS ABS: 4.9 10*3/uL (ref 1.5–6.5)
Platelets: 369 10*3/uL (ref 145–400)
RBC: 2.94 10*6/uL — ABNORMAL LOW (ref 3.70–5.45)
RDW: 20.4 % — AB (ref 11.2–14.5)
WBC: 7.3 10*3/uL (ref 3.9–10.3)

## 2013-07-02 LAB — PROTIME-INR
INR: 2.6 (ref 2.00–3.50)
Protime: 31.2 Seconds — ABNORMAL HIGH (ref 10.6–13.4)

## 2013-07-02 MED ORDER — METHYLPREDNISOLONE 4 MG PO TABS: ORAL_TABLET | ORAL | Status: DC

## 2013-07-02 NOTE — Telephone Encounter (Signed)
appts made and printed. Pt is aware that tx will be added. i emailed MW to add the tx...td 

## 2013-07-02 NOTE — Patient Instructions (Signed)
Continue coumadin 5 mg daily except 2.5 mg on Mondays  Medrol (steroid): for rash and for fingers.  Take each dose with food.  Take 8 mg when you get prescription today then 4 mg late afternoon today; then 4 mg with breakfast and 4 mg with lunch on 3-12, 3-13 and 3-14; then 4 mg with breakfast 3-15 and 3-16 then stop.

## 2013-07-02 NOTE — Telephone Encounter (Signed)
Per staff message and POF I have scheduled appts.  JMW  

## 2013-07-03 ENCOUNTER — Ambulatory Visit: Payer: Medicare Other

## 2013-07-03 ENCOUNTER — Telehealth: Payer: Self-pay

## 2013-07-03 ENCOUNTER — Encounter: Payer: Self-pay | Admitting: Oncology

## 2013-07-03 LAB — CA 125: CA 125: 231.2 U/mL — AB (ref 0.0–30.2)

## 2013-07-03 NOTE — Progress Notes (Signed)
OFFICE PROGRESS NOTE   07/02/2013   Physicians:P.Clayton Bibles, MD ([facility] [place], Kara Pacer   INTERVAL HISTORY:  Patient is seen, alone for visit, in continuing attention to metastatic ovarian cancer involving liver and hypercoagulable state. She had cycle 2 Alimta on 06-12-13 with neulasta on 1-19, complicated by mildly puritic rash beginning ~ day 6-7, tho platelets maintained well with dose reduction that cycle. Rash has been upper arms, chest and upper back, improved with Sarna lotion tho not clearly better with occasional hydrocortisone 1% topical; rash has been stable past several days.  Tips of index fingers bilaterally still have vasculitic/ ischemic type changes tho bruising/ subQ bleeding has improved. Fingers ache in cold despite gloves.  Neurologic symptoms are stable. She has no symptoms of LE thrombophlebitis now. She has had no overt bleeding. She has no abdominal discomfort.   She does not have central catheter.   ONCOLOGIC HISTORY   Ovarian cancer   06/12/2012 Initial Diagnosis Ovarian cancer   06/21/2012 - 08/16/2012 Chemotherapy dose dense paclitaxel and carboplatin   09/10/2012 Surgery suboptimal debulking    Chemotherapy liposomal dox planned but EF 15%   12/02/2012 -  Chemotherapy gemcitabine    Review of systems as above, also: No fever. No increased SOB. No LE swelling. Appetite fair.  Remainder of 10 point Review of Systems negative/ unchanged.  Objective:  Vital signs in last 24 hours:  BP 126/83  Pulse 87  Temp(Src) 98.4 F (36.9 C) (Oral)  Resp 18  Ht 5\' 7"  (1.702 m)  Wt 144 lb 12.8 oz (65.681 kg)  BMI 22.67 kg/m2 Weight is down 4 lbs. Alert, oriented and appropriate. Ambulatory without assistance.  No alopecia, not wearing wig.   HEENT:PERRL, sclerae not icteric. Oral mucosa moist without lesions, posterior pharynx clear.  Neck supple. No JVD.  Lymphatics:no cervical,suraclavicular,  inguinal adenopathy Resp: clear to auscultation bilaterally and normal percussion bilaterally Cardio: regular rate and rhythm. No gallop. GI: soft, nontender, not distended, no mass or organomegaly. Normally active bowel sounds. Surgical incision not remarkable. Musculoskeletal/ Extremities: without pitting edema, hot or tender cords, tenderness. Residual slight firmness along vein right medial lower thigh, extensive varicosities LE. Index fingertips bilaterally with scattered 2-3 mm dark areas, tender, slightly mottled color otherwise but improved over my last exam, no streaking or erythema, radial pulses intact. Neuro: no peripheral neuropathy. Otherwise nonfocal Skin palpable scattered erythematous rash more confluent upper arms, also anterior chest and upper back to shoulders. No apparent suprainfection, no desquamation.   Lab Results:  Results for orders placed in visit on 07/02/13  CBC WITH DIFFERENTIAL      Result Value Ref Range   WBC 7.3  3.9 - 10.3 10e3/uL   NEUT# 4.9  1.5 - 6.5 10e3/uL   HGB 9.4 (*) 11.6 - 15.9 g/dL   HCT 28.7 (*) 34.8 - 46.6 %   Platelets 369  145 - 400 10e3/uL   MCV 97.7  79.5 - 101.0 fL   MCH 31.9  25.1 - 34.0 pg   MCHC 32.6  31.5 - 36.0 g/dL   RBC 2.94 (*) 3.70 - 5.45 10e6/uL   RDW 20.4 (*) 11.2 - 14.5 %   lymph# 1.0  0.9 - 3.3 10e3/uL   MONO# 1.0 (*) 0.1 - 0.9 10e3/uL   Eosinophils Absolute 0.3  0.0 - 0.5 10e3/uL   Basophils Absolute 0.1  0.0 - 0.1 10e3/uL   NEUT% 67.0  38.4 - 76.8 %  LYMPH% 13.9 (*) 14.0 - 49.7 %   MONO% 14.3 (*) 0.0 - 14.0 %   EOS% 3.7  0.0 - 7.0 %   BASO% 1.1  0.0 - 2.0 %  COMPREHENSIVE METABOLIC PANEL (KV42)      Result Value Ref Range   Sodium 138  136 - 145 mEq/L   Potassium 4.8  3.5 - 5.1 mEq/L   Chloride 106  98 - 109 mEq/L   CO2 25  22 - 29 mEq/L   Glucose 97  70 - 140 mg/dl   BUN 16.0  7.0 - 26.0 mg/dL   Creatinine 0.9  0.6 - 1.1 mg/dL   Total Bilirubin 0.22  0.20 - 1.20 mg/dL   Alkaline Phosphatase 147  40 - 150  U/L   AST 22  5 - 34 U/L   ALT 8  0 - 55 U/L   Total Protein 7.1  6.4 - 8.3 g/dL   Albumin 2.9 (*) 3.5 - 5.0 g/dL   Calcium 9.6  8.4 - 10.4 mg/dL   Anion Gap 8  3 - 11 mEq/L  PROTIME-INR      Result Value Ref Range   Protime 31.2 (*) 10.6 - 13.4 Seconds   INR 2.60  2.00 - 3.50   Lovenox No    CA 125      Result Value Ref Range   CA 125 231.2 (*) 0.0 - 30.2 U/mL    Hgb up from 8.7 on 06-26-13  ca 125 available after visit down from 1922 on 06-02-13.  Studies/Results:  No results found.  Medications: I have reviewed the patient's current medications. Will hold cycle 3 alimta at least a week due to persistant rash, and add medrol taper (8mg  now then 4 mg later this afternoon, then 4 mg with breakfast and lunch x 3d, then 4 mg with breakfast x 2 days. Patient given written instructions.  DISCUSSION: oral steroids in hopes of improving Alimta rash more fully, and for possibility of vasculitic type process, tho this may be further embolic problem.  Assessment/Plan:  1. 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 Cycle 1 Alimta 5-95-63 complicated by puritic rash despite decadron, leukopenia and platelets 43k . Cycle 2 alimta on 06-12-13 with dose reduction tolerated better by counts, again rash, significant improvement in ca125. Add solumedrol oral taper and will see her back in a week with treatment same day if possible.   2.embolic CVAs fall 8756 and recurrent episodes of superficial thrombophlebitis. IVC filter in. Liver mets likely will increase sensitivity to coumadin; she has previously had rapid recurrence of superficial thrombophlebitis off anticoagulation. INR good at 2.6, continue coumadin 5 mg daily except 2.5 mg on Mondays. Repeat with visit next week. 3.cardiomyopathy: EF 15%, followed by Dr Daneen Schick  4.DNR patient's request but still treating in attempt to control disease. HCPOA is friend Clair Gulling.   5.long past tobacco  6.embolic or vasculitic changes fingertips: continuing coumadin and ASA, add steroids, check IgM and ANA with next labs. Keep hands warm.      Gordy Levan, MD

## 2013-07-03 NOTE — Telephone Encounter (Signed)
Message copied by Baruch Merl on Thu Jul 03, 2013  4:03 PM ------      Message from: Evlyn Clines P      Created: Thu Jul 03, 2013  2:19 PM       Labs seen and need follow up: please let her know the marker is lower again, at 231! Ask how rash is and how she is tolerating steroids begun 3-11 ------

## 2013-07-03 NOTE — Telephone Encounter (Signed)
Labs seen and need follow up: please let her know liver chemistries are better -- alk phos is now in normal range at 147 & AST normal at 22, these having been elevated in [place] and Feb. Rest of CMET normal except albumin a little low at 2.9, so as good of a diet as she can eat including protein would be best.

## 2013-07-04 NOTE — Telephone Encounter (Signed)
Told Ms. Larios her lab results from 07-02-13 as noted below by Dr. Marko Plume.  Monique [name] thrilled. She stated that she is tolerating the steroids well.The rash is much better on her arms.  The rash was much better the next day after beginning the medrol dose pack.

## 2013-07-10 ENCOUNTER — Other Ambulatory Visit: Payer: Self-pay | Admitting: Oncology

## 2013-07-11 ENCOUNTER — Ambulatory Visit: Payer: Medicare Other | Admitting: Pharmacist

## 2013-07-11 ENCOUNTER — Encounter: Payer: Self-pay | Admitting: Oncology

## 2013-07-11 ENCOUNTER — Ambulatory Visit (HOSPITAL_BASED_OUTPATIENT_CLINIC_OR_DEPARTMENT_OTHER): Payer: Medicare Other

## 2013-07-11 ENCOUNTER — Ambulatory Visit (HOSPITAL_BASED_OUTPATIENT_CLINIC_OR_DEPARTMENT_OTHER): Payer: Medicare Other | Admitting: Oncology

## 2013-07-11 ENCOUNTER — Telehealth: Payer: Self-pay | Admitting: Oncology

## 2013-07-11 VITALS — BP 124/77 | HR 76 | Temp 97.8°F | Resp 18 | Ht 67.0 in | Wt 145.8 lb

## 2013-07-11 DIAGNOSIS — D6859 Other primary thrombophilia: Secondary | ICD-10-CM

## 2013-07-11 DIAGNOSIS — Z86718 Personal history of other venous thrombosis and embolism: Secondary | ICD-10-CM

## 2013-07-11 DIAGNOSIS — I776 Arteritis, unspecified: Secondary | ICD-10-CM

## 2013-07-11 DIAGNOSIS — I8003 Phlebitis and thrombophlebitis of superficial vessels of lower extremities, bilateral: Secondary | ICD-10-CM

## 2013-07-11 DIAGNOSIS — C569 Malignant neoplasm of unspecified ovary: Secondary | ICD-10-CM

## 2013-07-11 DIAGNOSIS — C787 Secondary malignant neoplasm of liver and intrahepatic bile duct: Secondary | ICD-10-CM

## 2013-07-11 DIAGNOSIS — R21 Rash and other nonspecific skin eruption: Secondary | ICD-10-CM

## 2013-07-11 DIAGNOSIS — I63432 Cerebral infarction due to embolism of left posterior cerebral artery: Secondary | ICD-10-CM

## 2013-07-11 DIAGNOSIS — I8000 Phlebitis and thrombophlebitis of superficial vessels of unspecified lower extremity: Secondary | ICD-10-CM

## 2013-07-11 DIAGNOSIS — D5912 Cold autoimmune hemolytic anemia: Secondary | ICD-10-CM

## 2013-07-11 DIAGNOSIS — Z5111 Encounter for antineoplastic chemotherapy: Secondary | ICD-10-CM

## 2013-07-11 DIAGNOSIS — D591 Other autoimmune hemolytic anemias: Secondary | ICD-10-CM

## 2013-07-11 DIAGNOSIS — I639 Cerebral infarction, unspecified: Secondary | ICD-10-CM

## 2013-07-11 DIAGNOSIS — Z7901 Long term (current) use of anticoagulants: Secondary | ICD-10-CM

## 2013-07-11 DIAGNOSIS — I6340 Cerebral infarction due to embolism of unspecified cerebral artery: Secondary | ICD-10-CM

## 2013-07-11 LAB — CBC WITH DIFFERENTIAL/PLATELET
BASO%: 0.1 % (ref 0.0–2.0)
BASOS ABS: 0 10*3/uL (ref 0.0–0.1)
EOS ABS: 0 10*3/uL (ref 0.0–0.5)
EOS%: 0 % (ref 0.0–7.0)
HCT: 31 % — ABNORMAL LOW (ref 34.8–46.6)
HEMOGLOBIN: 9.8 g/dL — AB (ref 11.6–15.9)
LYMPH#: 0.9 10*3/uL (ref 0.9–3.3)
LYMPH%: 10.9 % — ABNORMAL LOW (ref 14.0–49.7)
MCH: 30.2 pg (ref 25.1–34.0)
MCHC: 31.6 g/dL (ref 31.5–36.0)
MCV: 95.4 fL (ref 79.5–101.0)
MONO#: 0.9 10*3/uL (ref 0.1–0.9)
MONO%: 11.4 % (ref 0.0–14.0)
NEUT%: 77.6 % — ABNORMAL HIGH (ref 38.4–76.8)
NEUTROS ABS: 6.2 10*3/uL (ref 1.5–6.5)
Platelets: 261 10*3/uL (ref 145–400)
RBC: 3.25 10*6/uL — ABNORMAL LOW (ref 3.70–5.45)
RDW: 17.4 % — ABNORMAL HIGH (ref 11.2–14.5)
WBC: 8 10*3/uL (ref 3.9–10.3)

## 2013-07-11 LAB — PROTIME-INR
INR: 2.2 (ref 2.00–3.50)
Protime: 26.4 Seconds — ABNORMAL HIGH (ref 10.6–13.4)

## 2013-07-11 LAB — POCT INR: INR: 2.2

## 2013-07-11 MED ORDER — ONDANSETRON 8 MG/NS 50 ML IVPB
INTRAVENOUS | Status: AC
  Filled 2013-07-11: qty 8

## 2013-07-11 MED ORDER — DEXAMETHASONE SODIUM PHOSPHATE 10 MG/ML IJ SOLN
INTRAMUSCULAR | Status: AC
  Filled 2013-07-11: qty 1

## 2013-07-11 MED ORDER — METHYLPREDNISOLONE 4 MG PO TABS: ORAL_TABLET | ORAL | Status: DC

## 2013-07-11 MED ORDER — CYANOCOBALAMIN 1000 MCG/ML IJ SOLN
1000.0000 ug | Freq: Once | INTRAMUSCULAR | Status: AC
  Administered 2013-07-11: 1000 ug via INTRAMUSCULAR

## 2013-07-11 MED ORDER — SODIUM CHLORIDE 0.9 % IV SOLN
Freq: Once | INTRAVENOUS | Status: AC
  Administered 2013-07-11: 12:00:00 via INTRAVENOUS

## 2013-07-11 MED ORDER — CYANOCOBALAMIN 1000 MCG/ML IJ SOLN
INTRAMUSCULAR | Status: AC
  Filled 2013-07-11: qty 1

## 2013-07-11 MED ORDER — SODIUM CHLORIDE 0.9 % IV SOLN
300.0000 mg/m2 | Freq: Once | INTRAVENOUS | Status: AC
  Administered 2013-07-11: 550 mg via INTRAVENOUS
  Filled 2013-07-11: qty 22

## 2013-07-11 MED ORDER — DEXAMETHASONE SODIUM PHOSPHATE 10 MG/ML IJ SOLN
10.0000 mg | Freq: Once | INTRAMUSCULAR | Status: AC
  Administered 2013-07-11: 10 mg via INTRAVENOUS

## 2013-07-11 MED ORDER — ONDANSETRON 8 MG/50ML IVPB (CHCC)
8.0000 mg | Freq: Once | INTRAVENOUS | Status: AC
  Administered 2013-07-11: 8 mg via INTRAVENOUS

## 2013-07-11 NOTE — Patient Instructions (Addendum)
[facility] Discharge Instructions for Patients Receiving Chemotherapy  Today you received the following chemotherapy agents Alimta.  To help prevent nausea and vomiting after your treatment, we encourage you to take your nausea medication Zofran 8 mg every 12 hours as needed.   If you develop nausea and vomiting that is not controlled by your nausea medication, call the clinic.   BELOW ARE SYMPTOMS THAT SHOULD BE REPORTED IMMEDIATELY:  *FEVER GREATER THAN 100.5 F  *CHILLS WITH OR WITHOUT FEVER  NAUSEA AND VOMITING THAT IS NOT CONTROLLED WITH YOUR NAUSEA MEDICATION  *UNUSUAL SHORTNESS OF BREATH  *UNUSUAL BRUISING OR BLEEDING  TENDERNESS IN MOUTH AND THROAT WITH OR WITHOUT PRESENCE OF ULCERS  *URINARY PROBLEMS  *BOWEL PROBLEMS  UNUSUAL RASH Items with * indicate a potential emergency and should be followed up as soon as possible.  Feel free to call the clinic should you have any questions or concerns. The clinic phone number is [phone].

## 2013-07-11 NOTE — Telephone Encounter (Signed)
, °

## 2013-07-11 NOTE — Progress Notes (Signed)
INR = 2.2   Pt taking Coumadin 5 mg daily except 2.5 mg on Mondays Pt took her 2.5 mg dose on Tues this week since she had not broken her Monday dose/tablet in half. She recently changed to a different generic Warfarin.  I assured her that as long as we continue to monitor her INR closely, she will be ok to switch generic products. Pt has had rash from Pemetrexed.  She required Medrol dose pack until 07/07/13 & this did help the rash & itching.  Pt reports the rash occurred more delayed this last cycle than w/ her 1st dose of Pemetrexed.  She is hoping she may not need the Medrol pack this time around but Dr Marko Plume has already rx'd so that she'd have it on hand if the rash returns. INR is therapeutic.  Appears the steroids did not alter her INR to a large degree. I will leave pts Coumadin dose the same. Plan to recheck her INR on 07/21/13 when she is here already for labs but pt knows that if she does not have to take the steroids this cycle, she can call us & resched her CC visit to a later day. Kennith Center, Pharm.D., CPP 07/11/2013@12 :09 PM

## 2013-07-11 NOTE — Progress Notes (Signed)
OFFICE PROGRESS NOTE   07/11/2013   Physicians:P.Clayton Bibles, MD ([facility])/ [place], Kara Pacer   INTERVAL HISTORY:  Patient is seen, alone for visit, in continuing attention to metastatic ovarian cancer involving liver and hypercoagulable state. She appears to be having a dramatic early response to Alimta, with CA 125 down from 3107 shortly after cycle 1 Alimta to 231 on 07-02-13. The Alimta has been complicated by thrombocytopenia cycle 1, managed with dose reduction cycle 2, and puritic rash after each of these treatments. Cycle 3 was delayed last week due to persistant rash, which improved with solumedrol taper over 5 days then. She still has area of rash left upper arm and minimal scattered areas anterior chest; she has had no mucositis and no blistering.  Index fingertips are improving from apparent tiny ischemic areas and discoloration, not clear if this is arterial emboli or vasculitic/ Raynaud's.  She remains on coumadin and ASA. She has had no bleeding, no new neurologic problems and no symptoms of LE thrombophlebitis now. INR is therapeutic today. She is otherwise feeling reasonably well, is encouraged by drop in marker and hopes to resume treatment today.  She does not have central catheter.  ONCOLOGIC HISTORY   Ovarian cancer   06/12/2012 Initial Diagnosis Ovarian cancer   06/21/2012 - 08/16/2012 Chemotherapy dose dense paclitaxel and carboplatin   09/10/2012 Surgery suboptimal debulking    Chemotherapy liposomal dox planned but EF 15%   12/02/2012 -  Chemotherapy gemcitabine  Patient developed abdominal symptoms in May 2013, saw GI Aug 2013, but was unable to tolerate prep for colonoscopy. She developed LLE superficial phlebitis and swelling in Jan 2014 , with acute thrombus left common femoral vein. CT AP 05-29-2012 had extensive ascites, small pleural effusions, focal PE, masses near dome of liver and omentum, complex mass  12.8 x 6.6 cm in pelvis, extensive pelvic DVT with probable chronic thrombus in IVC, multiple small retroperitoneal nodes, left inguinal node 1.8 x 1.6 cm and right inguinal node 2.2 x 1.5 cm. She was begun on lovenox and transitioned to Xarelto by PCP. CA 125 was 569. She saw Dr Alycia Rossetti on 06-12-12, with 15 cm fixed pelvic mass. US paracentesis by IR 06-12-12 for 2.8 liters had cytology ( NZB14 - 126) positive for adenocarcinoma. Neoadjuvant dose dense carbo/taxol was given x 3 cycles 06-21-12 thru 08-16-12, with good partial response by CT 08-19-12 and CA 125 down from 1041 in Feb 2014 to 92 on 08-21-12. IVC filter placed prior to exploratory laparotomy with extensive lysis of adhesions, left ureteral lysis, and bilateral salpingo-oophorectomy by Dr Alycia Rossetti on 09-10-12, suboptimal debulking. Operative findings:diffuse carcinomatosis of the residual omentum involving the entire transverse colon. Transverse colon densely adherent to the anterior abdominal wall. No disease throughout the small bowel. Mesenteric adenopathy with lymph nodes measuring 1 cm, involving the root of the small bowel mesentery. ~5 centimeter right ovarian mass and ~ 6 cm left ovarian mass. Small bowel densely adherent to the pelvis. 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 Chemotherapy resumed with dose dense taxol carboplatin on 09-27-12, completing cycle 5 on 11-08-12. CA 125 increased from 92 in April to 130 post op in June, then 225 late  July and repeat 318. CT CAP done 11-19-12 showed increase in nodular thickening along falciform ligament and small peritoneal nodule right iliac fossa. Plan was to change in chemotherapy to doxil  with avastin, however echocardiogram 11-27-12 unexpectedly had EF of 15%. 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 She was stable to resume gemzar with cycle 2 at 800 mg/m2 given on 12-30-12,then platelets down to 70k by day 8. She had progressive superficial thrombophlebitis LE and was changed to full dose lovenox then; there was difficulty getting lovenox covered by insurance. 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 Gemzar was subsequently tolerated well at 500 mg/m2, without significant thrombocytopenia, total 10 cycles given thru 04-28-13. She progressed in liver and nodes by CT 05-13-13. She wanted to continue treatment, with Alimta chosen due to comorbidities, begun 05-22-13. Platelets were 43k by day 9 cycle 1; neulasta was given day 8. Cycle 2 Alimta was dose reduced by 25% due to platelet nadir.   Review of systems as above, also: No fever. No increased SOB or other respiratory symptoms. No LE swelling. No pain. Appetite much better on steroids "150%".  Remainder of 10 point Review of Systems negative.  Objective:  Vital signs in last 24 hours:  BP 124/77  Pulse 76  Temp(Src) 97.8 F (36.6 C) (Oral)  Resp 18  Ht 5\' 7"  (1.702 m)  Wt 145 lb 12.8 oz (66.134 kg)  BMI 22.83 kg/m2  Alert, oriented and appropriate. Ambulatory without difficulty.  No alopecia  HEENT:PERRL, sclerae not icteric. Oral mucosa moist without lesions, posterior pharynx clear.  Neck supple. No JVD.  Lymphatics:no cervical,suraclavicular or inguinal adenopathy Resp: clear to auscultation bilaterally and normal percussion bilaterally Cardio:  regular rate and rhythm. No gallop. Clear heart sounds GI: soft, nontender, not distended, no mass or organomegaly. Normally active bowel sounds. Surgical incision not remarkable. Musculoskeletal/ Extremities: without pitting edema, acute cords, tenderness. Improving discoloration and slight thickening at medial lower right thigh site of acute superficial cord most recently. Varicosities and other areas of discoloration bilat LE as usual. Index fingertips more normal color tho still more pale R than L, with several superficial apparent dark areas of infarcts up to 2-3 mm bilaterally. No ecchymoses now. Neuro: no peripheral neuropathy. Speech entirely fluent today, mentation very clear and sharp. Psych: affect and mood appropriate and good. Skin  Erythematous, mostly confluent slightly palpable rash left upper arm over ~ 5 x 6 cm, no vesicles and no desquamation. Scattered <0.5 mm areas similar rash anterior chest. Skin not obviously dry.  Lab Results:  Results for orders placed in visit on 07/11/13  CBC WITH DIFFERENTIAL      Result Value Ref Range   WBC 8.0  3.9 - 10.3 10e3/uL   NEUT# 6.2  1.5 - 6.5 10e3/uL   HGB 9.8 (*) 11.6 - 15.9 g/dL   HCT 31.0 (*) 34.8 - 46.6 %   Platelets 261  145 - 400 10e3/uL   MCV 95.4  79.5 - 101.0 fL   MCH 30.2  25.1 - 34.0 pg   MCHC 31.6  31.5 - 36.0 g/dL   RBC 3.25 (*) 3.70 - 5.45 10e6/uL  RDW 17.4 (*) 11.2 - 14.5 %   lymph# 0.9  0.9 - 3.3 10e3/uL   MONO# 0.9  0.1 - 0.9 10e3/uL   Eosinophils Absolute 0.0  0.0 - 0.5 10e3/uL   Basophils Absolute 0.0  0.0 - 0.1 10e3/uL   NEUT% 77.6 (*) 38.4 - 76.8 %   LYMPH% 10.9 (*) 14.0 - 49.7 %   MONO% 11.4  0.0 - 14.0 %   EOS% 0.0  0.0 - 7.0 %   BASO% 0.1  0.0 - 2.0 %  PROTIME-INR      Result Value Ref Range   Protime 26.4 (*) 10.6 - 13.4 Seconds   INR 2.20  2.00 - 3.50   Lovenox No      ANA and SPEP pending in evaluation of ? Raynauds other etiology of the areas on fingers.  Studies/Results:  No results  found.  Medications: I have reviewed the patient's current medications. She is taking the 3 days of decadron around Alimta as recommended. Agree with coumadin clinic recommendation to continue same coumadin at 5 mg daily except 2.5 mg Mondays. She will have B12 with chemo today.  DISCUSSION: I have discussed Alimta rash with [facility] pharmacists today, who have also done information searches in addition to searches that I have done; they are not aware of other patients at [facility] with significant rash problems and there is no information that suggests rash would be expected to worsen, tho Avel Sensor is a reported possible complication of Alimta. WIth apparent good response of the cancer thus far, will proceed with cycle 3, tho I have dose reduced this slightly. Patient will have solumedrol refilled and is instructed to begin this at first sign of increase in rash. She will continue topical Sarna lotion (topical steroid cream did not seem helpful after cycle 1).  Assessment/Plan:  1. 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 Cycle 1 Alimta 7-56-43 complicated by puritic rash despite decadron, leukopenia and platelets 43k . Cycle 2 alimta on 06-12-13 with dose reduction tolerated better by counts, again rash, significant improvement in ca125. Cycle 3 today as above, with additional solumedrol to start immediately if more rash. 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 IVC filter in. Monitoring coumadin closely with liver mets and chemo.  3.cardiomyopathy: EF 15%, followed by Dr Daneen Schick  4.DNR patient's request but still treating in attempt to control disease. HCPOA is friend Clair Gulling.  5.long past tobacco  6.embolic or vasculitic changes fingertips: continuing coumadin and ASA, add steroids, check IgM and ANA pending. Keep hands warm. I doubt  vascular surgery evaluation could add to present interventions now.   Appreciate assistance from [facility] pharmacists. Time spent 35 min including >50% in discussion and coordination of care.   LIVESAY,LENNIS P, MD   07/11/2013, 10:08 PM

## 2013-07-12 ENCOUNTER — Ambulatory Visit (HOSPITAL_BASED_OUTPATIENT_CLINIC_OR_DEPARTMENT_OTHER): Payer: Medicare Other

## 2013-07-12 ENCOUNTER — Other Ambulatory Visit: Payer: Self-pay | Admitting: Oncology

## 2013-07-12 VITALS — BP 116/53 | HR 60 | Temp 98.4°F | Resp 18

## 2013-07-12 DIAGNOSIS — C569 Malignant neoplasm of unspecified ovary: Secondary | ICD-10-CM

## 2013-07-12 DIAGNOSIS — Z5189 Encounter for other specified aftercare: Secondary | ICD-10-CM

## 2013-07-12 DIAGNOSIS — C787 Secondary malignant neoplasm of liver and intrahepatic bile duct: Secondary | ICD-10-CM

## 2013-07-12 MED ORDER — PEGFILGRASTIM INJECTION 6 MG/0.6ML
6.0000 mg | Freq: Once | SUBCUTANEOUS | Status: AC
  Administered 2013-07-12: 6 mg via SUBCUTANEOUS

## 2013-07-14 ENCOUNTER — Other Ambulatory Visit: Payer: Self-pay | Admitting: *Deleted

## 2013-07-14 MED ORDER — LISINOPRIL 2.5 MG PO TABS: 2.5000 mg | ORAL_TABLET | Freq: Every day | ORAL | Status: DC

## 2013-07-15 ENCOUNTER — Other Ambulatory Visit: Payer: Self-pay | Admitting: Oncology

## 2013-07-16 LAB — SPEP & IFE WITH QIG
ALPHA-1-GLOBULIN: 4.8 % (ref 2.9–4.9)
Albumin ELP: 51.7 % — ABNORMAL LOW (ref 55.8–66.1)
Alpha-2-Globulin: 10.1 % (ref 7.1–11.8)
BETA 2: 3.9 % (ref 3.2–6.5)
Beta Globulin: 6.6 % (ref 4.7–7.2)
GAMMA GLOBULIN: 22.9 % — AB (ref 11.1–18.8)
IGA: 263 mg/dL (ref 69–380)
IGM, SERUM: 171 mg/dL (ref 52–322)
IgG (Immunoglobin G), Serum: 1700 mg/dL (ref 690–1700)
Total Protein, Serum Electrophoresis: 6.7 g/dL (ref 6.0–8.3)

## 2013-07-16 LAB — ANTI-NUCLEAR AB-TITER (ANA TITER)

## 2013-07-16 LAB — ANA: Anti Nuclear Antibody(ANA): POSITIVE — AB

## 2013-07-21 ENCOUNTER — Encounter: Payer: Self-pay | Admitting: Oncology

## 2013-07-21 ENCOUNTER — Ambulatory Visit: Payer: Medicare Other | Admitting: Pharmacist

## 2013-07-21 ENCOUNTER — Telehealth: Payer: Self-pay | Admitting: Oncology

## 2013-07-21 ENCOUNTER — Telehealth: Payer: Self-pay | Admitting: *Deleted

## 2013-07-21 ENCOUNTER — Ambulatory Visit (HOSPITAL_BASED_OUTPATIENT_CLINIC_OR_DEPARTMENT_OTHER): Payer: Medicare Other | Admitting: Oncology

## 2013-07-21 ENCOUNTER — Other Ambulatory Visit (HOSPITAL_BASED_OUTPATIENT_CLINIC_OR_DEPARTMENT_OTHER): Payer: Medicare Other

## 2013-07-21 VITALS — BP 115/74 | HR 77 | Temp 98.3°F | Resp 18 | Ht 67.0 in | Wt 148.7 lb

## 2013-07-21 DIAGNOSIS — I6340 Cerebral infarction due to embolism of unspecified cerebral artery: Secondary | ICD-10-CM

## 2013-07-21 DIAGNOSIS — I6350 Cerebral infarction due to unspecified occlusion or stenosis of unspecified cerebral artery: Secondary | ICD-10-CM

## 2013-07-21 DIAGNOSIS — Z86718 Personal history of other venous thrombosis and embolism: Secondary | ICD-10-CM

## 2013-07-21 DIAGNOSIS — I63432 Cerebral infarction due to embolism of left posterior cerebral artery: Secondary | ICD-10-CM

## 2013-07-21 DIAGNOSIS — I8003 Phlebitis and thrombophlebitis of superficial vessels of lower extremities, bilateral: Secondary | ICD-10-CM

## 2013-07-21 DIAGNOSIS — C569 Malignant neoplasm of unspecified ovary: Secondary | ICD-10-CM

## 2013-07-21 DIAGNOSIS — I8000 Phlebitis and thrombophlebitis of superficial vessels of unspecified lower extremity: Secondary | ICD-10-CM

## 2013-07-21 DIAGNOSIS — C787 Secondary malignant neoplasm of liver and intrahepatic bile duct: Secondary | ICD-10-CM

## 2013-07-21 DIAGNOSIS — I639 Cerebral infarction, unspecified: Secondary | ICD-10-CM

## 2013-07-21 LAB — CBC WITH DIFFERENTIAL/PLATELET
BASO%: 0.7 % (ref 0.0–2.0)
Basophils Absolute: 0.1 10*3/uL (ref 0.0–0.1)
EOS ABS: 0.2 10*3/uL (ref 0.0–0.5)
EOS%: 1.7 % (ref 0.0–7.0)
HCT: 30.9 % — ABNORMAL LOW (ref 34.8–46.6)
HGB: 9.8 g/dL — ABNORMAL LOW (ref 11.6–15.9)
LYMPH#: 0.9 10*3/uL (ref 0.9–3.3)
LYMPH%: 7.1 % — ABNORMAL LOW (ref 14.0–49.7)
MCH: 30.7 pg (ref 25.1–34.0)
MCHC: 31.8 g/dL (ref 31.5–36.0)
MCV: 96.5 fL (ref 79.5–101.0)
MONO#: 1.3 10*3/uL — ABNORMAL HIGH (ref 0.1–0.9)
MONO%: 9.7 % (ref 0.0–14.0)
NEUT%: 80.8 % — ABNORMAL HIGH (ref 38.4–76.8)
NEUTROS ABS: 10.7 10*3/uL — AB (ref 1.5–6.5)
Platelets: 102 10*3/uL — ABNORMAL LOW (ref 145–400)
RBC: 3.2 10*6/uL — ABNORMAL LOW (ref 3.70–5.45)
RDW: 17.9 % — ABNORMAL HIGH (ref 11.2–14.5)
WBC: 13.2 10*3/uL — AB (ref 3.9–10.3)

## 2013-07-21 LAB — COMPREHENSIVE METABOLIC PANEL (CC13)
ALT: 12 U/L (ref 0–55)
ANION GAP: 7 meq/L (ref 3–11)
AST: 23 U/L (ref 5–34)
Albumin: 3.1 g/dL — ABNORMAL LOW (ref 3.5–5.0)
Alkaline Phosphatase: 182 U/L — ABNORMAL HIGH (ref 40–150)
BUN: 22.4 mg/dL (ref 7.0–26.0)
CHLORIDE: 106 meq/L (ref 98–109)
CO2: 24 mEq/L (ref 22–29)
Calcium: 9.4 mg/dL (ref 8.4–10.4)
Creatinine: 0.9 mg/dL (ref 0.6–1.1)
GLUCOSE: 128 mg/dL (ref 70–140)
Potassium: 4.5 mEq/L (ref 3.5–5.1)
SODIUM: 136 meq/L (ref 136–145)
TOTAL PROTEIN: 6.9 g/dL (ref 6.4–8.3)
Total Bilirubin: 0.29 mg/dL (ref 0.20–1.20)

## 2013-07-21 LAB — PROTIME-INR
INR: 2.4 (ref 2.00–3.50)
Protime: 28.8 Seconds — ABNORMAL HIGH (ref 10.6–13.4)

## 2013-07-21 LAB — POCT INR: INR: 2.4

## 2013-07-21 LAB — CA 125: CA 125: 205 U/mL — ABNORMAL HIGH (ref 0.0–30.2)

## 2013-07-21 NOTE — Progress Notes (Signed)
OFFICE PROGRESS NOTE   07/21/2013   Physicians:P.Clayton Bibles, MD (PCP [facility])/ [place], Kara Pacer   INTERVAL HISTORY:  Patient is seen, alone for visit, in continuing attention to chemotherapy in process for metastatic ovarian cancer involving liver, also hypercoagulable state. She is having clinical response to Alimta, cycle 3 given 07-11-13 with neulasta 07-12-13; alimta dose has been reduced cycles 2 and 3 due to thrombocytopenia and rash. She tolerated most recent treatment the best so far, with near complete resolution of the minimal residual rash while on usual 3 days of steroids around the alimta. Beginning 3-28 she again had puritic rash on left upper arm; she has not taken further steroids for this, but Sarna lotion is soothing. Otherwise she is feeling the best that she has in months, tho stamina still poor. She has had no nausea, no mucositis or skin desquamation, no abdominal or pelvic pain, no new symptoms of superficial thrombophlebitis and no new neurologic symptoms. Index fingertips are improved. She had little aching from neulasta.   ONCOLOGIC HISTORY   Ovarian cancer   06/12/2012 Initial Diagnosis Ovarian cancer   06/21/2012 - 08/16/2012 Chemotherapy dose dense paclitaxel and carboplatin   09/10/2012 Surgery suboptimal debulking    Chemotherapy liposomal dox planned but EF 15%   12/02/2012 -  Chemotherapy gemcitabine  Patient developed abdominal symptoms in May 2013, saw GI Aug 2013, but was unable to tolerate prep for colonoscopy. She developed LLE superficial phlebitis and swelling in Jan 2014 , with acute thrombus left common femoral vein. CT AP 05-29-2012 had extensive ascites, small pleural effusions, focal PE, masses near dome of liver and omentum, complex mass 12.8 x 6.6 cm in pelvis, extensive pelvic DVT with probable chronic thrombus in IVC, multiple small retroperitoneal nodes, left inguinal node 1.8 x 1.6 cm and right  inguinal node 2.2 x 1.5 cm. She was begun on lovenox and transitioned to Xarelto by PCP. CA 125 was 569. She saw Dr Alycia Rossetti on 06-12-12, with 15 cm fixed pelvic mass. US paracentesis by IR 06-12-12 for 2.8 liters had cytology ( NZB14 - 126) positive for adenocarcinoma. Neoadjuvant dose dense carbo/taxol was given x 3 cycles 06-21-12 thru 08-16-12, with good partial response by CT 08-19-12 and CA 125 down from 1041 in Feb 2014 to 92 on 08-21-12. IVC filter placed prior to exploratory laparotomy with extensive lysis of adhesions, left ureteral lysis, and bilateral salpingo-oophorectomy by Dr Alycia Rossetti on 09-10-12, suboptimal debulking. Operative findings:diffuse carcinomatosis of the residual omentum involving the entire transverse colon. Transverse colon densely adherent to the anterior abdominal wall. No disease throughout the small bowel. Mesenteric adenopathy with lymph nodes measuring 1 cm, involving the root of the small bowel mesentery. ~5 centimeter right ovarian mass and ~ 6 cm left ovarian mass. Small bowel densely adherent to the pelvis. 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 Chemotherapy resumed with dose dense taxol carboplatin on 09-27-12, completing cycle 5 on 11-08-12. CA 125 increased from 92 in April to 130 post op in June, then 225 late July and repeat 318. CT CAP done 11-19-12 showed increase in nodular thickening along falciform ligament and small peritoneal nodule right iliac fossa. Plan was to change in chemotherapy  to doxil with avastin, however echocardiogram 11-27-12 unexpectedly had EF of 15%. She received one cycle of gemzar on 12-02-12, given peripherally, then had embolic CVAs on  3-32-95 while still on xarelto, with bleeding into the embolic CVA , and treated with heparin transitioned to coumadin. She was stable to resume gemzar with cycle 2 at 800 mg/m2 given on 12-30-12,then platelets down to 70k by day 8. She had progressive superficial thrombophlebitis LE and was changed to full dose lovenox then; there was difficulty getting lovenox covered by insurance. Cycle 3 gemzar 01-13-13 was dose reduced to 600 mg/m2, however platelets still dropped to 90k; she had recurrent embolic CVAs on 1-88-41 and was hospitalized thru 01-28-13, treated with continuous heparin transitioned back to coumadin, plus baby ASA daily. Gemzar was subsequently tolerated well at 500 mg/m2, without significant thrombocytopenia, total 10 cycles given thru 04-28-13. She progressed in liver and nodes by CT 05-13-13. She wanted to continue treatment, with Alimta chosen due to comorbidities, begun 05-22-13. Platelets were 43k by day 9 cycle 1; neulasta was given day 8. Cycle 2 Alimta was dose reduced by 25% due to platelet nadir.   Review of systems as above, also: No bleeding. Appetite some better. Bowels moving regularly. No chest pain or SOB with walking at home.  Remainder of 10 point Review of Systems negative.  Objective:  Vital signs in last 24 hours:  BP 115/74  Pulse 77  Temp(Src) 98.3 F (36.8 C) (Oral)  Resp 18  Ht 5\' 7"  (1.702 m)  Wt 148 lb 11.2 oz (67.45 kg)  BMI 23.28 kg/m2  SpO2 99% Weight is up 3 lbs. Alert, oriented and appropriate. Ambulatory without difficulty.  No alopecia  HEENT:PERRL, sclerae not icteric. Oral mucosa moist without lesions, posterior pharynx clear.  Neck supple. No JVD.  Lymphatics:no cervical,suraclavicular, axillary or inguinal adenopathy Resp: clear to auscultation bilaterally and normal percussion bilaterally Cardio: regular rate and rhythm. No gallop. GI: soft, nontender, not distended, no mass or organomegaly. Normally active bowel sounds. Surgical incision  not remarkable. Musculoskeletal/ Extremities: without pitting edema, new cords, tenderness. Previous residual cord right medial lower thigh now barely palpable in some areas; previous shorter residual cord medial left knee also improved. No new areas of concern, stable very obvious varicosities LE bilaterally. Neuro: no peripheral neuropathy. Speech fluent other than single minimal episode. No sensory, cerebellar, motor deficits noted. Skin Erythematous mostly confluent rash over area ~ 6 x 9 cm upper left arm, minimal vesicular changes scattered, no skin tears or desquamation.Index fingertips pink, only minimal desquamation on left and 2x3 mm superficial erosion on right without apparent infection; no rash otherwise including chest, upper back, RUE or trunk otherwise. Few 1-2 cm ecchymosis on forearms. No petechiae  No central catheter  Lab Results:  Results for orders placed in visit on 07/21/13  POCT INR      Result Value Ref Range   INR 2.4     CBC today with WBC 13.2 and ANC 10.7 post neulasta, Hgb stable at 9.8, plt 102k CMET available after visit with electrolytes normal, AP 182, AST 23, ALT 12, alb up to 3.1. CA 125 available after visit 205  ANA returned weakly positive at 1:80 speckled "usually not significant" SPEP normal  Studies/Results:  No results found.  Medications: I have reviewed the patient's current medications. We have decided not to start solumedrol for rash now, tho she has it at home and will call + start steroids if rash progresses. As no other med changes,  will leave coumadin dose same as discussed with [facility] pharmacist now. Note she had B12 with cycle 3 alimta.  DISCUSSION: Patient is in full agreement with continuing treatment with alimta; she would be glad to try some dose increase if that seems appropriate. We will let her know labs as above.  Assessment/Plan: 1. 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 Cycle 1 Alimta 7-67-34 complicated by puritic rash despite decadron, leukopenia and platelets 43k . Cycle 2 alimta on 06-12-13 with dose reduction tolerated better by counts, again rash, significant improvement in ca125. Cycle 3 given 07-11-13 as above. She will have CBC with INR/ coumadin clinic on 07-30-13, then cycle 4 Alimta on 08-01-13 as long as [place] >=1.5 and plt >=100k by labs 4-8. She will have neulasta day 2 and I will see her ~ 2 weeks after cycle 4.  2.embolic CVAs fall 1937 and recurrent episodes of superficial thrombophlebitis. IVC filter in. Monitoring coumadin closely with liver mets and chemo. Best in last 2 weeks that she has had in months, likely with response otherwise on present chemo. 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 She is not active but otherwise not usually symptomatic.  4.DNR patient's request but still treating in attempt to control disease. HCPOA is friend Clair Gulling.  5.long past tobacco  6.embolic or vasculitic changes fingertips +/- Raynauds: improved, also likely related to improvement in underlying malignancy now. Continue coumadin and ASA, keep hands warm. I doubt vascular surgery evaluation could add to present interventions now.    Patient in agreement with all of plan    Lenis Nettleton P, MD   07/21/2013, 2:09 PM

## 2013-07-21 NOTE — Telephone Encounter (Signed)
, °

## 2013-07-21 NOTE — Progress Notes (Signed)
INR = 2.4 on Coumadin 5 mg/day; 2.5 mg on Mondays. Pltc = 102 today Pt is w/o complaints re: anticoag.  No bleeding. She did not have to start her Medrol dose pack despite having continued rash from Alimta.  The rash is present on both arms.  Dr. Marko Plume is aware & is following.  She does not like to take steroids & would like to avoid taking them if possible.  If she does go on the Medrol, she understands this could cause her INR to increase.   INR is at goal.  No change to Coumadin dose. We will see her 08/01/13 in infusion & assess her INR at that visit. Kennith Center, Pharm.D., CPP 07/21/2013@10 :46 AM

## 2013-07-21 NOTE — Telephone Encounter (Signed)
Per staff message and POF I have scheduled appts.  JMW  

## 2013-07-22 ENCOUNTER — Telehealth: Payer: Self-pay

## 2013-07-22 NOTE — Telephone Encounter (Signed)
Told Monique [name] that Dr. Marko Plume said to hold the coumadin dose this evening and resume regular schedule beginning 07-23-13.. This bleeding in the eye can look bad but is not dangerous to vision.  It will resolve. Told Monique [name] the ca-125 level as noted by Dr. Marko Plume below.  Monique [name] verbalized understanding.

## 2013-07-22 NOTE — Telephone Encounter (Signed)
Message copied by Baruch Merl on Tue Jul 22, 2013 11:54 AM ------      Message from: Gordy Levan      Created: Mon Jul 21, 2013  8:35 PM       Labs seen and need follow up: please let her know ca125 down to 205 ------

## 2013-07-22 NOTE — Telephone Encounter (Signed)
Monique [name] stated that she could have possibly taken 10 mg of coumadin last evening.  She took 5 mg ~6 pm and thaen arose during the night and thought she had not taken evening dose so she took 5 mg ~10:30 pm. Notes from Coumadin clinic state dose on Monday is 2.5 mg. She felt some mascara in her left eye and rubbed it.  She saw herself in the mirror and the left outer sclera has bled in a cresent shape.  It is not on the iris or pupil.  It has not enlarged  Over the last 2 hours.  Denies any other bleeding. Should Ms. Delay hold tonight's dose? Dose is 5 mg daily except 2.5 on Monday's. [name] stated that she now is putting coumadin doses in a pill bax as instructed awhile ago. Next INR with Coumadin Clinic on 07-30-13.

## 2013-07-28 ENCOUNTER — Telehealth: Payer: Self-pay

## 2013-07-28 NOTE — Telephone Encounter (Signed)
Monique [name] was calling to let Dr. Marko Plume know that she began the Medrol dose pack Sat 07-26-13. The rash is better and the itching has decreased.

## 2013-07-30 ENCOUNTER — Other Ambulatory Visit: Payer: Medicare Other

## 2013-07-30 ENCOUNTER — Ambulatory Visit: Payer: Medicare Other

## 2013-07-31 ENCOUNTER — Other Ambulatory Visit: Payer: Self-pay | Admitting: Oncology

## 2013-07-31 ENCOUNTER — Other Ambulatory Visit: Payer: Self-pay

## 2013-08-01 ENCOUNTER — Ambulatory Visit: Payer: Medicare Other | Admitting: Pharmacist

## 2013-08-01 ENCOUNTER — Ambulatory Visit (HOSPITAL_BASED_OUTPATIENT_CLINIC_OR_DEPARTMENT_OTHER): Payer: Medicare Other

## 2013-08-01 ENCOUNTER — Other Ambulatory Visit (HOSPITAL_BASED_OUTPATIENT_CLINIC_OR_DEPARTMENT_OTHER): Payer: Medicare Other

## 2013-08-01 VITALS — BP 124/65 | HR 77 | Temp 98.7°F

## 2013-08-01 DIAGNOSIS — C569 Malignant neoplasm of unspecified ovary: Secondary | ICD-10-CM

## 2013-08-01 DIAGNOSIS — Z5111 Encounter for antineoplastic chemotherapy: Secondary | ICD-10-CM

## 2013-08-01 DIAGNOSIS — Z86718 Personal history of other venous thrombosis and embolism: Secondary | ICD-10-CM

## 2013-08-01 DIAGNOSIS — C787 Secondary malignant neoplasm of liver and intrahepatic bile duct: Secondary | ICD-10-CM

## 2013-08-01 DIAGNOSIS — I8000 Phlebitis and thrombophlebitis of superficial vessels of unspecified lower extremity: Secondary | ICD-10-CM

## 2013-08-01 DIAGNOSIS — I8003 Phlebitis and thrombophlebitis of superficial vessels of lower extremities, bilateral: Secondary | ICD-10-CM

## 2013-08-01 DIAGNOSIS — I639 Cerebral infarction, unspecified: Secondary | ICD-10-CM

## 2013-08-01 LAB — CBC WITH DIFFERENTIAL/PLATELET
BASO%: 0.1 % (ref 0.0–2.0)
BASOS ABS: 0 10*3/uL (ref 0.0–0.1)
EOS%: 0 % (ref 0.0–7.0)
Eosinophils Absolute: 0 10*3/uL (ref 0.0–0.5)
HCT: 32.5 % — ABNORMAL LOW (ref 34.8–46.6)
HGB: 10.4 g/dL — ABNORMAL LOW (ref 11.6–15.9)
LYMPH%: 10.2 % — ABNORMAL LOW (ref 14.0–49.7)
MCH: 30.5 pg (ref 25.1–34.0)
MCHC: 32 g/dL (ref 31.5–36.0)
MCV: 95.3 fL (ref 79.5–101.0)
MONO#: 0.8 10*3/uL (ref 0.1–0.9)
MONO%: 9.6 % (ref 0.0–14.0)
NEUT%: 80.1 % — AB (ref 38.4–76.8)
NEUTROS ABS: 6.3 10*3/uL (ref 1.5–6.5)
NRBC: 0 % (ref 0–0)
PLATELETS: 268 10*3/uL (ref 145–400)
RBC: 3.41 10*6/uL — AB (ref 3.70–5.45)
RDW: 16.8 % — ABNORMAL HIGH (ref 11.2–14.5)
WBC: 7.8 10*3/uL (ref 3.9–10.3)
lymph#: 0.8 10*3/uL — ABNORMAL LOW (ref 0.9–3.3)

## 2013-08-01 LAB — PROTIME-INR
INR: 2.1 (ref 2.00–3.50)
Protime: 25.2 Seconds — ABNORMAL HIGH (ref 10.6–13.4)

## 2013-08-01 LAB — POCT INR: INR: 2.1

## 2013-08-01 MED ORDER — DEXAMETHASONE SODIUM PHOSPHATE 10 MG/ML IJ SOLN
INTRAMUSCULAR | Status: AC
  Filled 2013-08-01: qty 1

## 2013-08-01 MED ORDER — ONDANSETRON 8 MG/NS 50 ML IVPB
INTRAVENOUS | Status: AC
  Filled 2013-08-01: qty 8

## 2013-08-01 MED ORDER — DEXAMETHASONE SODIUM PHOSPHATE 10 MG/ML IJ SOLN
10.0000 mg | Freq: Once | INTRAMUSCULAR | Status: AC
  Administered 2013-08-01: 10 mg via INTRAVENOUS

## 2013-08-01 MED ORDER — ONDANSETRON 8 MG/50ML IVPB (CHCC)
8.0000 mg | Freq: Once | INTRAVENOUS | Status: AC
  Administered 2013-08-01: 8 mg via INTRAVENOUS

## 2013-08-01 MED ORDER — SODIUM CHLORIDE 0.9 % IV SOLN
Freq: Once | INTRAVENOUS | Status: AC
  Administered 2013-08-01: 10:00:00 via INTRAVENOUS

## 2013-08-01 MED ORDER — SODIUM CHLORIDE 0.9 % IV SOLN
350.0000 mg/m2 | Freq: Once | INTRAVENOUS | Status: AC
  Administered 2013-08-01: 625 mg via INTRAVENOUS
  Filled 2013-08-01: qty 25

## 2013-08-01 NOTE — Patient Instructions (Signed)
[place] Discharge Instructions for Patients Receiving Chemotherapy  Today you received the following chemotherapy agents: alimta   To help prevent nausea and vomiting after your treatment, we encourage you to take your nausea medication.  Take it as often as prescribed.     If you develop nausea and vomiting that is not controlled by your nausea medication, call the clinic. If it is after clinic hours your family physician or the after hours number for the clinic or go to the Emergency Department.   BELOW ARE SYMPTOMS THAT SHOULD BE REPORTED IMMEDIATELY:  *FEVER GREATER THAN 100.5 F  *CHILLS WITH OR WITHOUT FEVER  NAUSEA AND VOMITING THAT IS NOT CONTROLLED WITH YOUR NAUSEA MEDICATION  *UNUSUAL SHORTNESS OF BREATH  *UNUSUAL BRUISING OR BLEEDING  TENDERNESS IN MOUTH AND THROAT WITH OR WITHOUT PRESENCE OF ULCERS  *URINARY PROBLEMS  *BOWEL PROBLEMS  UNUSUAL RASH Items with * indicate a potential emergency and should be followed up as soon as possible.  Feel free to call the clinic you have any questions or concerns. The clinic phone number is [phone].   I have been informed and understand all the instructions given to me. I know to contact the clinic, my physician, or go to the Emergency Department if any problems should occur. I do not have any questions at this time, but understand that I may call the clinic during office hours   should I have any questions or need assistance in obtaining follow up care.    __________________________________________  _____________  __________ Signature of Patient or Authorized Representative            Date                   Time    __________________________________________ Nurse's [place] Discharge Instructions for Patients Receiving Chemotherapy  Today you received the following chemotherapy agents:   To help prevent nausea and vomiting after your treatment, we encourage you  to take your nausea medication.  Take it as often as prescribed.     If you develop nausea and vomiting that is not controlled by your nausea medication, call the clinic. If it is after clinic hours your family physician or the after hours number for the clinic or go to the Emergency Department.   BELOW ARE SYMPTOMS THAT SHOULD BE REPORTED IMMEDIATELY:  *FEVER GREATER THAN 100.5 F  *CHILLS WITH OR WITHOUT FEVER  NAUSEA AND VOMITING THAT IS NOT CONTROLLED WITH YOUR NAUSEA MEDICATION  *UNUSUAL SHORTNESS OF BREATH  *UNUSUAL BRUISING OR BLEEDING  TENDERNESS IN MOUTH AND THROAT WITH OR WITHOUT PRESENCE OF ULCERS  *URINARY PROBLEMS  *BOWEL PROBLEMS  UNUSUAL RASH Items with * indicate a potential emergency and should be followed up as soon as possible.  Feel free to call the clinic you have any questions or concerns. The clinic phone number is [phone].   I have been informed and understand all the instructions given to me. I know to contact the clinic, my physician, or go to the Emergency Department if any problems should occur. I do not have any questions at this time, but understand that I may call the clinic during office hours   should I have any questions or need assistance in obtaining follow up care.    __________________________________________  _____________  __________ Signature of Patient or Authorized Representative            Date  Time    __________________________________________ Nurse's Signature

## 2013-08-01 NOTE — Progress Notes (Signed)
INR at goal Pt seen in infusion area Pt is doing well with no complaints other than continued rash from alimta (this is improving) Pt reports no unusual bleeding or bruising. She does have a popped blood vessel in her eye that is healing but still visible No medication or diet changes No missed or extra doses Plan: No changes Continue coumadin 5 mg daily with 2.5 mg on Mondays Return on 08/15/13 lab at 12:30, Dr. Marko Plume apt at 1:00pm and Coumadin clinic at 1:45pm

## 2013-08-01 NOTE — Patient Instructions (Signed)
INR at goal No changes Continue coumadin 5 mg daily with 2.5 mg on Mondays Return on 08/15/13 lab at 12:30, Dr. Marko Plume apt at 1:00pm and Coumadin clinic at 1:45pm

## 2013-08-02 ENCOUNTER — Ambulatory Visit (HOSPITAL_BASED_OUTPATIENT_CLINIC_OR_DEPARTMENT_OTHER): Payer: Medicare Other

## 2013-08-02 VITALS — BP 111/61 | HR 75 | Temp 97.8°F | Resp 20

## 2013-08-02 DIAGNOSIS — Z5189 Encounter for other specified aftercare: Secondary | ICD-10-CM

## 2013-08-02 DIAGNOSIS — C787 Secondary malignant neoplasm of liver and intrahepatic bile duct: Secondary | ICD-10-CM

## 2013-08-02 DIAGNOSIS — C569 Malignant neoplasm of unspecified ovary: Secondary | ICD-10-CM

## 2013-08-02 MED ORDER — PEGFILGRASTIM INJECTION 6 MG/0.6ML
6.0000 mg | Freq: Once | SUBCUTANEOUS | Status: AC
  Administered 2013-08-02: 6 mg via SUBCUTANEOUS

## 2013-08-02 NOTE — Patient Instructions (Signed)

## 2013-08-06 ENCOUNTER — Telehealth: Payer: Self-pay | Admitting: *Deleted

## 2013-08-06 ENCOUNTER — Other Ambulatory Visit: Payer: Self-pay | Admitting: *Deleted

## 2013-08-06 DIAGNOSIS — C569 Malignant neoplasm of unspecified ovary: Secondary | ICD-10-CM

## 2013-08-06 DIAGNOSIS — I8003 Phlebitis and thrombophlebitis of superficial vessels of lower extremities, bilateral: Secondary | ICD-10-CM

## 2013-08-06 DIAGNOSIS — I63432 Cerebral infarction due to embolism of left posterior cerebral artery: Secondary | ICD-10-CM

## 2013-08-06 MED ORDER — FLUCONAZOLE 100 MG PO TABS: 100.0000 mg | ORAL_TABLET | Freq: Every day | ORAL | Status: DC

## 2013-08-06 MED ORDER — WARFARIN SODIUM 5 MG PO TABS: 5.0000 mg | ORAL_TABLET | Freq: Every day | ORAL | Status: DC

## 2013-08-06 NOTE — Telephone Encounter (Signed)
Pt left a message stating she had chemo last Friday and her tongue was sore for a few days. Now tongue is coated in  yellow-white.

## 2013-08-06 NOTE — Telephone Encounter (Signed)
Message copied by Patton Salles on Wed Aug 06, 2013 10:43 AM ------      Message from: Evlyn Clines P      Created: Wed Aug 06, 2013 10:04 AM       Re message from RN that patient has sore coated tongue: begin diflucan 100 mg daily x 7      Thanks      Cc TH, [place] ------

## 2013-08-06 NOTE — Telephone Encounter (Signed)
Message copied by Patton Salles on Wed Aug 06, 2013 10:39 AM ------      Message from: Evlyn Clines P      Created: Wed Aug 06, 2013 10:04 AM       Re message from RN that patient has sore coated tongue: begin diflucan 100 mg daily x 7      Thanks      Cc TH, [place] ------

## 2013-08-06 NOTE — Telephone Encounter (Signed)
Called patient to let her know we sent in a prescription for diflucan to her pharmacy.

## 2013-08-12 ENCOUNTER — Other Ambulatory Visit: Payer: Self-pay | Admitting: Oncology

## 2013-08-14 ENCOUNTER — Other Ambulatory Visit: Payer: Self-pay

## 2013-08-14 MED ORDER — SPIRONOLACTONE 25 MG PO TABS: 25.0000 mg | ORAL_TABLET | Freq: Every day | ORAL | Status: DC

## 2013-08-15 ENCOUNTER — Other Ambulatory Visit (HOSPITAL_BASED_OUTPATIENT_CLINIC_OR_DEPARTMENT_OTHER): Payer: Medicare Other

## 2013-08-15 ENCOUNTER — Ambulatory Visit: Payer: Medicare Other | Admitting: Pharmacist

## 2013-08-15 ENCOUNTER — Telehealth: Payer: Self-pay | Admitting: Oncology

## 2013-08-15 ENCOUNTER — Encounter: Payer: Self-pay | Admitting: Oncology

## 2013-08-15 ENCOUNTER — Ambulatory Visit (HOSPITAL_BASED_OUTPATIENT_CLINIC_OR_DEPARTMENT_OTHER): Payer: Medicare Other | Admitting: Oncology

## 2013-08-15 VITALS — BP 115/76 | HR 78 | Temp 98.6°F | Resp 18 | Ht 67.0 in | Wt 148.9 lb

## 2013-08-15 DIAGNOSIS — C569 Malignant neoplasm of unspecified ovary: Secondary | ICD-10-CM

## 2013-08-15 DIAGNOSIS — Z86718 Personal history of other venous thrombosis and embolism: Secondary | ICD-10-CM

## 2013-08-15 DIAGNOSIS — I8003 Phlebitis and thrombophlebitis of superficial vessels of lower extremities, bilateral: Secondary | ICD-10-CM

## 2013-08-15 DIAGNOSIS — C787 Secondary malignant neoplasm of liver and intrahepatic bile duct: Secondary | ICD-10-CM

## 2013-08-15 DIAGNOSIS — I639 Cerebral infarction, unspecified: Secondary | ICD-10-CM

## 2013-08-15 DIAGNOSIS — B370 Candidal stomatitis: Secondary | ICD-10-CM

## 2013-08-15 DIAGNOSIS — I6350 Cerebral infarction due to unspecified occlusion or stenosis of unspecified cerebral artery: Secondary | ICD-10-CM

## 2013-08-15 LAB — CBC WITH DIFFERENTIAL/PLATELET
BASO%: 0.4 % (ref 0.0–2.0)
Basophils Absolute: 0 10*3/uL (ref 0.0–0.1)
EOS ABS: 0.1 10*3/uL (ref 0.0–0.5)
EOS%: 1.5 % (ref 0.0–7.0)
HCT: 35.3 % (ref 34.8–46.6)
HGB: 11.5 g/dL — ABNORMAL LOW (ref 11.6–15.9)
LYMPH%: 11 % — AB (ref 14.0–49.7)
MCH: 31.1 pg (ref 25.1–34.0)
MCHC: 32.5 g/dL (ref 31.5–36.0)
MCV: 95.7 fL (ref 79.5–101.0)
MONO#: 0.9 10*3/uL (ref 0.1–0.9)
MONO%: 9.8 % (ref 0.0–14.0)
NEUT%: 77.3 % — AB (ref 38.4–76.8)
NEUTROS ABS: 6.7 10*3/uL — AB (ref 1.5–6.5)
PLATELETS: 219 10*3/uL (ref 145–400)
RBC: 3.7 10*6/uL (ref 3.70–5.45)
RDW: 17 % — ABNORMAL HIGH (ref 11.2–14.5)
WBC: 8.7 10*3/uL (ref 3.9–10.3)
lymph#: 1 10*3/uL (ref 0.9–3.3)

## 2013-08-15 LAB — COMPREHENSIVE METABOLIC PANEL (CC13)
ALK PHOS: 161 U/L — AB (ref 40–150)
ALT: 12 U/L (ref 0–55)
ANION GAP: 8 meq/L (ref 3–11)
AST: 27 U/L (ref 5–34)
Albumin: 3.4 g/dL — ABNORMAL LOW (ref 3.5–5.0)
BILIRUBIN TOTAL: 0.24 mg/dL (ref 0.20–1.20)
BUN: 21.2 mg/dL (ref 7.0–26.0)
CO2: 25 meq/L (ref 22–29)
CREATININE: 1.1 mg/dL (ref 0.6–1.1)
Calcium: 9.7 mg/dL (ref 8.4–10.4)
Chloride: 104 mEq/L (ref 98–109)
GLUCOSE: 96 mg/dL (ref 70–140)
Potassium: 5.3 mEq/L — ABNORMAL HIGH (ref 3.5–5.1)
SODIUM: 137 meq/L (ref 136–145)
Total Protein: 7.5 g/dL (ref 6.4–8.3)

## 2013-08-15 LAB — PROTIME-INR

## 2013-08-15 LAB — POCT INR: INR: 6.92

## 2013-08-15 LAB — PROTHROMBIN TIME
INR: 6.92 (ref <1.50)
PROTHROMBIN TIME: 56.9 s — AB (ref 11.6–15.2)

## 2013-08-15 MED ORDER — NYSTATIN 100000 UNIT/ML MT SUSP: 5.0000 mL | Freq: Three times a day (TID) | OROMUCOSAL | Status: DC

## 2013-08-15 NOTE — Progress Notes (Signed)
INR above goal due to diflucan Pt is doing well with no complaints Pt saw Dr. Marko Plume this afternoon Pt reports minimal bruising (which is normal per Ms. Trickel) and no unusual bleeding No missed or extra doses No diet changes Pt started diflucan 100 mg daily x 7 on 08/06/13 and took her last dose on Tuesday 08/12/13 This is the cause of the elevated INR as patient had been stable on current dose Pt is concerned about holding coumadin but understands it is also just as dangerous to have an elevated INR. Plan: Hold coumadin tonight and over the weekend then take 5 mg on Monday and  Return on 08/19/13 lab at 8:30, and coumadin clinic at 8:45 and Dr. Alycia Rossetti at [facility]

## 2013-08-15 NOTE — Progress Notes (Signed)
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 She has had marked drop in CA125 with Alimta, this having been ~ 3000 at start of Alimta 05-22-13 and down to 205 by end of March. She has had improvement in abdomen and pelvic symptoms on Alimta, and less problem with superficial thrombophlebitis, but has had progressive rash with each treatment. She continues coumadin for embolic CVAs, DVTs and recurrent superficial thrombophlebitis. Alimta rash has been more visibly severe this cycle, tho not as pruritic as previously. Patient is not sure if she took solumedrol with most recent Rx.She had diflucan for reported oral thrush, likely causing increase in INR today. Tongue is still coated, mouth and throat not sore. She has not had overt bleeding. Left index finger is more discolored, possibly with trauma from repair of a window yesterday. She felt "arms and legs weak" with diflucan.  She does not have central catheter. She has IVC filter.   ONCOLOGIC HISTORY   Ovarian cancer   06/12/2012 Initial Diagnosis Ovarian cancer   06/21/2012 - 08/16/2012 Chemotherapy dose dense paclitaxel and carboplatin   09/10/2012 Surgery suboptimal debulking    Chemotherapy liposomal dox planned but EF 15%   12/02/2012 -  Chemotherapy gemcitabine  Patient developed abdominal symptoms in May 2013, saw GI Aug 2013, but was unable to tolerate prep for colonoscopy. She developed LLE superficial phlebitis and swelling in Jan 2014 , with acute thrombus left common femoral vein. 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 She was begun on lovenox and transitioned to Xarelto by PCP. CA 125 was 569. She saw Dr Alycia Rossetti on 06-12-12, with 15 cm fixed pelvic mass. US paracentesis by IR 06-12-12 for 2.8 liters had cytology ( [id number] - 126) positive for adenocarcinoma. Neoadjuvant dose dense carbo/taxol was given x 3 cycles 06-21-12 thru 08-16-12, with good partial response by CT 08-19-12 and CA 125 down from 1041 in Feb 2014 to 92 on 08-21-12. IVC filter placed prior to exploratory laparotomy with extensive lysis of adhesions, left ureteral lysis, and bilateral salpingo-oophorectomy by Dr Alycia Rossetti on 09-10-12, suboptimal debulking. Operative findings:diffuse carcinomatosis of the residual omentum involving the entire transverse colon. Transverse colon densely adherent to the anterior abdominal wall. No disease throughout the small bowel. Mesenteric adenopathy with lymph nodes measuring 1 cm, involving the root of the small bowel mesentery. ~5 centimeter right ovarian mass and ~ 6 cm left ovarian mass. Small bowel densely adherent to the pelvis. 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 Chemotherapy resumed with dose dense taxol carboplatin on 09-27-12, completing cycle 5 on 11-08-12. CA 125 increased from 92 in April to 130 post op in June, then 225 late July  and repeat 318. CT CAP done 11-19-12 showed increase in nodular thickening along falciform ligament and small peritoneal nodule right iliac fossa. Plan was to change in chemotherapy to doxil with avastin,  however echocardiogram 11-27-12 unexpectedly had EF of 15%. She received one cycle of gemzar on 12-02-12, given peripherally, then had embolic CVAs on 1-61-09 while still on xarelto, with bleeding into the embolic CVA , and treated with heparin transitioned to coumadin. She was stable to resume gemzar with cycle 2 at 800 mg/m2 given on 12-30-12,then platelets down to 70k by day 8. She had progressive superficial thrombophlebitis LE and was changed to full dose lovenox then; there was difficulty getting lovenox covered by insurance. Cycle 3 gemzar 01-13-13 was dose reduced to 600 mg/m2, however platelets still dropped to 90k; she had recurrent embolic CVAs on 09-25-52 and was hospitalized thru 01-28-13, treated with continuous heparin transitioned back to coumadin, plus baby ASA daily. Gemzar was subsequently tolerated well at 500 mg/m2, without significant thrombocytopenia, total 10 cycles given thru 04-28-13. She progressed in liver and nodes by CT 05-13-13. She wanted to continue treatment, with Alimta chosen due to comorbidities, begun 05-22-13. Platelets were 43k by day 9 cycle 1; neulasta was given day 8. Cycle 2 Alimta was dose reduced by 25% due to platelet nadir. CA 125 improved from >3000 at start of Alimta to 205 in late March prior to cycle 4.   Review of systems as above, also: No other neurologic symptoms reported. Bowels moving. No abdominal distension or different pain. No bleeding. No symptoms of superficial thrombophlebitis now. Remainder of 10 point Review of Systems negative.  Objective:  Vital signs in last 24 hours:  BP 115/76  Pulse 78  Temp(Src) 98.6 F (37 C) (Oral)  Resp 18  Ht 5\' 7"  (1.702 m)  Wt 148 lb 14.4 oz (67.541 kg)  BMI 23.32 kg/m2  Alert, oriented and appropriate. Ambulatory without assistance. Cannot recall if she took extra steroids with most recent treatment, which is first time she has been confused about meds. Face puffy suggesting steroid facies (no central  catheter).  HEENT:PERRL, sclerae not icteric. Oral mucosa moist, tongue with slight coating not clearly thrush, posterior pharynx clear. Sclerae slightly injected bilaterally. Neck supple. No JVD.  Lymphatics:no cervical,suraclavicular, axillary or inguinal adenopathy Resp: somewhat diminished BS bilaterally otherwise clear to auscultation bilaterally and normal percussion bilaterally Cardio: regular rate and rhythm. No gallop. Clear heart sounds GI: soft, nontender, not distended, no mass or organomegaly. Normally active bowel sounds. Surgical incision not remarkable. Musculoskeletal/ Extremities: without pitting edema, new cords, tenderness, erythema. Extensive varicosities LE and residual thickenings/ discoloration along sites of prior superficial thromboses. Neuro: Speech mostly fluent. No new focal deficits obvious Skin Extensive, bright red confluent rash upper arms bilaterally, less on anterior chest and neck, no desquamation or bullae. Scattered small SQ bleeds UE. Left index finger discolored, likely bruising but may be infarcts/ emboli. Minimal discoloration right index finger.   Lab Results:  Results for orders placed in visit on 08/15/13  CBC WITH DIFFERENTIAL      Result Value Ref Range   WBC 8.7  3.9 - 10.3 10e3/uL   NEUT# 6.7 (*) 1.5 - 6.5 10e3/uL   HGB 11.5 (*) 11.6 - 15.9 g/dL   HCT 35.3  34.8 - 46.6 %   Platelets 219  145 - 400 10e3/uL   MCV 95.7  79.5 - 101.0 fL   MCH 31.1  25.1 - 34.0 pg   MCHC 32.5  31.5 -  36.0 g/dL   RBC 3.70  3.70 - 5.45 10e6/uL   RDW 17.0 (*) 11.2 - 14.5 %   lymph# 1.0  0.9 - 3.3 10e3/uL   MONO# 0.9  0.1 - 0.9 10e3/uL   Eosinophils Absolute 0.1  0.0 - 0.5 10e3/uL   Basophils Absolute 0.0  0.0 - 0.1 10e3/uL   NEUT% 77.3 (*) 38.4 - 76.8 %   LYMPH% 11.0 (*) 14.0 - 49.7 %   MONO% 9.8  0.0 - 14.0 %   EOS% 1.5  0.0 - 7.0 %   BASO% 0.4  0.0 - 2.0 %  COMPREHENSIVE METABOLIC PANEL (YN82)      Result Value Ref Range   Sodium 137  136 - 145 mEq/L    Potassium 5.3 (*) 3.5 - 5.1 mEq/L   Chloride 104  98 - 109 mEq/L   CO2 25  22 - 29 mEq/L   Glucose 96  70 - 140 mg/dl   BUN 21.2  7.0 - 26.0 mg/dL   Creatinine 1.1  0.6 - 1.1 mg/dL   Total Bilirubin 0.24  0.20 - 1.20 mg/dL   Alkaline Phosphatase 161 (*) 40 - 150 U/L   AST 27  5 - 34 U/L   ALT 12  0 - 55 U/L   Total Protein 7.5  6.4 - 8.3 g/dL   Albumin 3.4 (*) 3.5 - 5.0 g/dL   Calcium 9.7  8.4 - 10.4 mg/dL   Anion Gap 8  3 - 11 mEq/L  PROTIME-INR      Result Value Ref Range   Protime Sent out for confirmation.  10.6 - 13.4 Seconds   INR Sent out for confirmation  2.00 - 3.50   Lovenox No    PROTHROMBIN TIME      Result Value Ref Range   Prothrombin Time 56.9 (*) 11.6 - 15.2 seconds   INR 6.92 (*) <1.50    CA 125 available after visit 264, this having been 205 on 3-30 and >3000 prior to first Alimta on 05-22-13. Studies/Results:  No results found. CT AP scheduled for 08-18-13.  Medications: I have reviewed the patient's current medications. She completed diflucan 4-21. Agree with coumadin clinic recommendations to hold coumadin thru weekend, take 5 mg on 4-27 and follow up with [facility] coumadin clinic on 4-28. Note INR usually falls rapidly off coumadin.  DISCUSSION: prior to seeing slight rise in CA 125, I had already told patient that we need to repeat staging CT before further Alimta, based on worse rash.  As always, patient requests to be treated regardless of side effects or risks otherwise, but does agree to scans first. Dr Alycia Rossetti also has availability at her clinic next week, which will be very helpful as we reevaluate now.  Assessment/Plan:  1. 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 Marked improvement in CA125 with Alimta since 9-56-21, tho complicated by progressive skin rash and intermittent thrombocytopenia. Repeat scans next week (#5 Alimta due 5-1 if continued).  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 IVC filter in. 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 INR excessively elevated today likely related to diflucan. Note superficial clots much better since appears to be responding to Alimta. Appointment with [facility] 08-28-13. 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 She is not active but otherwise not usually symptomatic.  4.DNR patient's request but still treating in attempt to  control disease. HCPOA is friend Clair Gulling.  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 Continue coumadin when INR improves, and ASA, keep hands warm. I doubt vascular surgery evaluation could add to present interventions now.    As always, any systemic chemo needs to take into account EF 15% and need to continue full anticoagulation (including avoiding low platelets). Therefore not candidate for doxil/adria and I think risk of avastin too high. I do not find ER PR information, tho hormonal blocker might be worth trying as maintenance if scans look good. I am glad to discuss with Dr Alycia Rossetti -- thanks.  Time spent 40 min including >50% discussion and coordination of care  Gordy Levan, MD   08/15/2013, 4:58 PM

## 2013-08-15 NOTE — Telephone Encounter (Signed)
gv pt appt schedule for april/may. central will contact pt re ct appt - per pt she goes 2hrs early to drink water based prep.

## 2013-08-15 NOTE — Patient Instructions (Signed)
INR above goal due to diflucan Hold coumadin tonight and over the weekend then take 5 mg on Monday and  Return on 08/19/13 lab at 8:30, and coumadin clinic at 8:45 and Dr. Alycia Rossetti at [facility]

## 2013-08-16 LAB — CA 125: CA 125: 264.1 U/mL — AB (ref 0.0–30.2)

## 2013-08-18 ENCOUNTER — Encounter (HOSPITAL_COMMUNITY): Payer: Self-pay

## 2013-08-18 ENCOUNTER — Ambulatory Visit (HOSPITAL_COMMUNITY)
Admission: RE | Admit: 2013-08-18 | Discharge: 2013-08-18 | Disposition: A | Discharge status: Home / Self Care | Payer: Medicare Other | Source: Ambulatory Visit | Attending: Oncology | Admitting: Oncology

## 2013-08-18 DIAGNOSIS — K8020 Calculus of gallbladder without cholecystitis without obstruction: Secondary | ICD-10-CM | POA: Insufficient documentation

## 2013-08-18 DIAGNOSIS — R1900 Intra-abdominal and pelvic swelling, mass and lump, unspecified site: Secondary | ICD-10-CM | POA: Insufficient documentation

## 2013-08-18 DIAGNOSIS — Z9221 Personal history of antineoplastic chemotherapy: Secondary | ICD-10-CM | POA: Insufficient documentation

## 2013-08-18 DIAGNOSIS — C569 Malignant neoplasm of unspecified ovary: Secondary | ICD-10-CM | POA: Insufficient documentation

## 2013-08-18 DIAGNOSIS — C787 Secondary malignant neoplasm of liver and intrahepatic bile duct: Secondary | ICD-10-CM | POA: Insufficient documentation

## 2013-08-18 MED ORDER — IOHEXOL 300 MG/ML  SOLN
100.0000 mL | Freq: Once | INTRAMUSCULAR | Status: AC | PRN
  Administered 2013-08-18: 100 mL via INTRAVENOUS

## 2013-08-18 MED ORDER — IOHEXOL 300 MG/ML  SOLN
50.0000 mL | Freq: Once | INTRAMUSCULAR | Status: AC | PRN
  Administered 2013-08-18: 50 mL via ORAL

## 2013-08-19 ENCOUNTER — Ambulatory Visit: Payer: Medicare Other | Attending: Gynecologic Oncology | Admitting: Gynecologic Oncology

## 2013-08-19 ENCOUNTER — Ambulatory Visit (HOSPITAL_BASED_OUTPATIENT_CLINIC_OR_DEPARTMENT_OTHER): Payer: Self-pay | Admitting: Pharmacist

## 2013-08-19 ENCOUNTER — Other Ambulatory Visit: Payer: Self-pay | Admitting: Oncology

## 2013-08-19 ENCOUNTER — Encounter: Payer: Self-pay | Admitting: Gynecologic Oncology

## 2013-08-19 ENCOUNTER — Other Ambulatory Visit (HOSPITAL_BASED_OUTPATIENT_CLINIC_OR_DEPARTMENT_OTHER): Payer: Medicare Other

## 2013-08-19 VITALS — BP 120/65 | HR 71 | Temp 98.8°F | Resp 17 | Wt 149.8 lb

## 2013-08-19 DIAGNOSIS — I6350 Cerebral infarction due to unspecified occlusion or stenosis of unspecified cerebral artery: Secondary | ICD-10-CM

## 2013-08-19 DIAGNOSIS — I8000 Phlebitis and thrombophlebitis of superficial vessels of unspecified lower extremity: Secondary | ICD-10-CM

## 2013-08-19 DIAGNOSIS — Z86718 Personal history of other venous thrombosis and embolism: Secondary | ICD-10-CM

## 2013-08-19 DIAGNOSIS — I639 Cerebral infarction, unspecified: Secondary | ICD-10-CM

## 2013-08-19 DIAGNOSIS — C569 Malignant neoplasm of unspecified ovary: Secondary | ICD-10-CM | POA: Insufficient documentation

## 2013-08-19 LAB — POCT INR: INR: 2

## 2013-08-19 LAB — PROTIME-INR
INR: 2 (ref 2.00–3.50)
Protime: 24 Seconds — ABNORMAL HIGH (ref 10.6–13.4)

## 2013-08-19 NOTE — Progress Notes (Signed)
INR = 2 Pt had supratherapeutic INR last week due to Fluconazole use for oral thrush.  Pt has improved sxs now.  She is only using Nystatin oral rinse. She has a mild cough.  Likely due to allergy sxs & an improving cold. She has had minor nosebleed but nothing unusual for her. INR now within goal.  I'll resume her previous dose of Coumadin: 5 mg daily w/ 2.5 mg once weekly.Marland Kitchenwe'll move it to Fridays now since she took 5 mg tab last night (Monday was her usual day to take 2.5 mg). Pt understands plan & she has a pill box for her Coumadin so she'll prepare that accordingly. She has appt w/ Dr. Alycia Rossetti today she is anxious to hear from scan yesterday.  (I think she'll be pleased!) We'll plan to see her next week w/ appt same day w/ Dr. Marko Plume on 08/27/13. Kennith Center, Pharm.D., CPP 08/19/2013@8 :59 AM

## 2013-08-19 NOTE — Progress Notes (Signed)
Consult Note: Gyn-Onc  Monique [name] 68 y.o. female  CC:  Chief Complaint  Patient presents with  . Ovarian Cancer    HPI: ONCOLOGIC HISTORY   Patient developed constipation with some right abdominal pain in May 2013. She saw GI physician in ~ Aug 2013, but was unable to tolerate prep for colonoscopy. She developed LLE superficial phlebitis in Jan 2014 , followed by swelling and discomfort LLE with venous doppler at [facility] in [facility] on 05-21-12 with acute thrombus left common femoral vein. 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 She was begun on lovenox and transitioned to Xarelto by Dr Coletta Memos. CA 125 was 569. She saw me on 06-12-12, with distended abdomen and 15 cm fixed pelvic mass. She had US paracentesis by IR 06-12-12 for 2.8 liters, cytology ( NZB14 - 126) adenocarcinoma. Neoadjuvant dose dense carbo/taxol was beun 06-21-12 with 3 cycles given thru 08-16-12. She needed neupogen days 2,8 and 16 on this regimen. She had good partial response by CTs 08-19-12 and had improvement in CA 125 from 1041 in Feb 2014 down to 92 on 08-21-12, after 3 cycles dose dense chemo.   She had IVC filter placed prior to exploratory laparotomy with extensive lysis of adhesions, left ureteral lysis, and bilateral salpingo-oophorectomy on 09-10-12, suboptimal debulking. Operative findings:diffuse carcinomatosis of  the residual omentum involving the entire transverse colon. Transverse colon densely adherent to the anterior abdominal wall. No disease throughout the small bowel. Mesenteric adenopathy with lymph nodes measuring 1 cm, involving the root of the small bowel mesentery. ~5 centimeter right ovarian mass and ~ 6 cm left ovarian mass. Small bowel densely adherent to the pelvis. 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 Chemotherapy resumed with dose dense taxol carboplatin on 09-27-12, completing cycle 5 on 11-08-12. CA 125 increased from 92 in April to 130 post op in June, then 225 late July and repeat 318. CT CAP done 11-19-12 showed increase in nodular thickening along falciform ligament and small peritoneal nodule right iliac fossa. Plan was to change in chemotherapy to doxil with avastin, however echocardiogram 11-27-12 unexpectedly had EF of 15%. 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 She was hospitalized 8-13 thru 12-11-12, had some bleeding into the embolic CVA on 0-17-51, and treated with heparin transitioned to coumadin. She was stable to resume gemzar with cycle 2 at 800 mg/m2 given on 12-30-12,then platelets down to 70k by day 8. She had progressive superficial thrombophlebitis LE on coumadin by 12-25-12 and was changed to full dose lovenox then; there was difficulty getting  lovenox covered by insurance. Cycle 3 gemzar 01-13-13 was dose reduced to 600 mg/m2, however platelets still dropped to 90k; she had recurrent embolic CVAs on [id number] and was hospitalized thru 01-28-13, treated with continuous heparin  transitioned back to coumadin, plus baby ASA daily. Gemzar has been tolerated well at 500 mg/m2, without significant thrombocytopenia.  CA 125 had been steadily increasing most recently was 3049. Should a CT scan on January 20 that revealed:  FINDINGS:  Innumerable small hypovascular masses are seen throughout the right and left hepatic lobes which are new since previous study, consistent with diffuse liver metastases. The pancreas, spleen, adrenal glands, and kidneys are normal in appearance. No evidence hydronephrosis. Mild ascites is increased since previous study. Increased size of soft tissue mass seen in the central pelvis pre disease involving the uterus or hysterectomy bed) which currently measures 5.5 x 5.4 cm on image 67, consistent with metastatic disease. There is also increased bilateral external iliac lymphadenopathy and mild retroperitoneal lymphadenopathy in the left paraaortic region, consistent with metastatic disease. Mild lymphadenopathy in the central small bowel mesentery remains stable. No suspicious bone lesions identified. IVC filter remains in place. Tiny right pleural effusion also seen.   IMPRESSION:  Diffuse liver metastases, new since previous study. Increased size of central pelvic soft tissue mass, consistent with metastatic disease. Increased retroperitoneal and bilateral pelvic lymphadenopathy, consistent with metastatic disease. Stable mild mesenteric lymphadenopathy. Increased mild ascites and small right pleural effusion.  Interval History:  She was on gemcitabine through January of 2015. At that time had progression in the liver and lymph nodes by CT imaging on January 20. The patient to continue treatment secondary to her comorbidities Alimta was started. She has been on Alimta starting January 29 the current time. She had a CT scan was recently performed on April 27. Her CA 125 has been declining from a high of 3107 most recently to 264. CT scan from April 27  revealed:  FINDINGS:  Diffuse liver metastases shows significant decrease in number and size compared to prior exam. Previously noted index lesion in the superior left hepatic lobe is no longer visualized on the current exam. Another index lesion in the right hepatic lobe measures 1.5 cm on image 21 compared with approximately 2.5 cm on previous exam. Mild ascites has resolved since prior exam. Numerous small less than 1 cm lymph nodes are implants in the central small bowel mesentery show no significant change. A central pelvic soft tissue mass currently measures 5.1 x 5.4 cm on image 66 compared to 5.5 x 5.4 cm previously.  Cholelithiasis again demonstrated, however there is no evidence of cholecystitis. The pancreas, spleen, adrenal glands, and kidneys are normal in appearance. No evidence hydronephrosis. IVC filter remains in place. No evidence of acute inflammatory process or abscess. No evidence of bowel obstruction. No suspicious bone lesions identified.  IMPRESSION:  -Significant interval decrease in diffuse liver metastases since prior study.  -Mild decrease in size of central pelvic mass. Interval resolution of ascites.  -Stable tiny less than 1 cm mesenteric lymph nodes versus peritoneal tumor.  -No new or progressive disease identified within the abdomen or pelvis.  -Cholelithiasis. No radiographic evidence of cholecystitis.  She is tolerating Alimta quite well. We discussed her CA 125 that went up slightly as well as her CT scan results. We will after several months of CT scan is together to track her disease status. She is overall feeling quite well. She states that she does scratch her upper extremities but  overall feels that the rash is improved. There is minimal pruritis. She is not really using the steroid cream that Dr. Marko Plume prescribed. She would very much like to stay on the alimta as it is working for her.  Review of Systems  Constitutional: Energy level is improved she  believes that she's eating quite well.Improved since stopping the diflucan Skin: Positive rash primarily her upper extremities. No pruritus. She believes it might be somewhat better. Cardiovascular: No chest pain, + shortness of breath with the gemcitabine, better now, or edema  Pulmonary: No cough or wheeze.  Gastro Intestinal: No nausea, vomiting, constipation, or diarrhea reported. No bright red blood per rectum or change in bowel movement.  Genitourinary: No frequency, urgency, or dysuria.  Denies vaginal bleeding and discharge.  Musculoskeletal: No myalgia, arthralgia, joint swelling or pain.  Neurologic: No weakness, numbness, or change in gait.  Psychology: Doing well   Current Meds:  Outpatient Encounter Prescriptions as of 08/19/2013  Medication Sig  . aspirin 81 MG tablet Take 81 mg by mouth daily.   Marland Kitchen dexamethasone (DECADRON) 4 MG tablet Take 1 tablet twice a day with food for 3 days.   Begin day prior to Alimta.  . Folic Acid 0.8 MG CAPS Take 1 capsule (0.8 mg total) by mouth daily.  . hydrocortisone cream 1 % Apply 1 application topically 2 (two) times daily. To rash as directed.  Marland Kitchen lisinopril (PRINIVIL,ZESTRIL) 2.5 MG tablet Take 1 tablet (2.5 mg total) by mouth daily.  . metoprolol succinate (TOPROL-XL) 25 MG 24 hr tablet Take 0.5 tablets (12.5 mg total) by mouth 3 (three) times daily.  Marland Kitchen nystatin (MYCOSTATIN) 100000 UNIT/ML suspension Take 5 mLs (500,000 Units total) by mouth 3 (three) times daily.  . ondansetron (ZOFRAN) 8 MG tablet Take 8 mg by mouth every 12 (twelve) hours as needed for nausea or vomiting.  . pantoprazole (PROTONIX) 40 MG tablet Take 1 tablet (40 mg total) by mouth daily.  . polyethylene glycol (MIRALAX / GLYCOLAX) packet Take 17 g by mouth daily as needed (for constipation).   . sennosides-docusate sodium (SENOKOT-S) 8.6-50 MG tablet Take 1-2 tablets by mouth daily as needed for constipation.  Marland Kitchen spironolactone (ALDACTONE) 25 MG tablet Take 1 tablet (25 mg  total) by mouth daily.  Marland Kitchen warfarin (COUMADIN) 5 MG tablet Take 1 tablet (5 mg total) by mouth daily. Except 2.5 mg on monday    Allergy:  Allergies  Allergen Reactions  . Septra [Sulfamethoxazole-Tmp Ds] Rash and Other (See Comments)    High fever, Burning skin  . Ampicillin Itching  . Fentanyl And Related Hives    Per Dr. Marcell Barlow, pt reports she is ok with fentanyl (09/10/12). Patient states it was epidural form of Fentanyl that she reacted to.   . Neosporin [Neomycin-Bacitracin Zn-Polymyx] Swelling and Other (See Comments)    opthalmic solution only can use topically. Swelling and inflamed eyes    Social Hx:   History   Social History  . Marital Status: Single    Spouse Name: N/A    Number of Children: N/A  . Years of Education: N/A   Occupational History  . Not on file.   Social History Main Topics  . Smoking status: Former Smoker -- 30 years    Types: Cigarettes    Quit date: 04/20/2012  . Smokeless tobacco: Never Used  . Alcohol Use: Yes     Comment: 01/21/2013 "long time ago I was a social drinker"  . Drug Use: No  . Sexual  Activity: Not Currently   Other Topics Concern  . Not on file   Social History Narrative  . No narrative on file    Past Surgical Hx:  Past Surgical History  Procedure Laterality Date  . Appendectomy  1966    ruptured, hospital for 3 weeks  . Rhinoplasty  1976  . Vein ligation and stripping Right 1998    leg  . Mohs surgery  2004, 2005    basal cell of the face  . Tooth extraction  03/2011?    "just one; had a root canal that got infected" (01/21/2013)  . Laparotomy N/A 09/10/2012    Procedure: EXPLORATORY LAPAROTOMY, BILATERAL SALPINGO OOPHORECTOMY, TUMOR DEBULKING;  Surgeon: Imagene Gurney A. Alycia Rossetti, MD;  Location: [facility];  Service: Gynecology;  Laterality: N/A;  . Exploratory laparotomy  09/10/12    Lysis of adhesions, BSO, suboptimal tumor debulking  . Vena cava filter placement  08/2012    Past Medical Hx:  Past Medical History   Diagnosis Date  . Acute venous embolism and thrombosis of unspecified deep vessels of lower extremity   . Bronchitis     several times  . Abdominal or pelvic swelling, mass or lump, unspecified site   . Phlebitis and thrombophlebitis of unspecified site   . Pelvic mass   . GERD (gastroesophageal reflux disease)   . PONV (postoperative nausea and vomiting)     "when fentanyl is used, will break out in itchy rast"  . Basal cell carcinoma of face   . Ovarian cancer   . CHF (congestive heart failure)     "just since 11/2012; they think it's from the chemo" (01/21/2013)  . 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 DVT (deep venous thrombosis) 04/2012    "BLE" (01/21/2013)  . Chronic bronchitis     "I had it several times; I used to smoke" (01/21/2013)  . Shortness of breath     "trouble taking a deep breath at times before RX started; I'm fine now" (01/21/2013)  . Anemia     "because of the chemo" (01/21/2013)  . Arthritis     "little in my right hand" (01/21/2013)  . Anxiety     "related to ovarian cancer; don't take RX for it" (01/21/2013)    Oncology Hx:    Ovarian cancer   06/12/2012 Initial Diagnosis Ovarian cancer   06/21/2012 - 08/16/2012 Chemotherapy dose dense paclitaxel and carboplatin   09/10/2012 Surgery suboptimal debulking    Chemotherapy liposomal dox planned but EF 15%   12/02/2012 - 04/28/2013 Chemotherapy gemcitabine x 10 cycles   05/22/2013 -  Chemotherapy Alimta     Family Hx:  Family History  Problem Relation Age of Onset  . Pneumonia Mother   . Heart attack Father     Vitals:  Blood pressure 120/65, pulse 71, temperature 98.8 F (37.1 C), temperature source Oral, resp. rate 17, weight 149 lb 12.8 oz (67.949 kg).  Physical Exam: Well-nourished well-developed female in no acute distress.  Neck: No lymphadenopathy no thyromegaly.  Lungs: Clear to auscultation bilaterally cardiovascular: Regular rate and  rhythm.  Abdomen: Soft, nontender nondistended.  Extremities: Patchy rash in the upper extremities and the deltoid region. No edema.  Pelvic: Normal external female genitalia. Bimanual examination the cervix is palpably normal. There is about a 6 cm pelvic mass is felt best on the posterior aspect of the cul-de-sac. It is smooth. There is no nodularity.    Assessment/Plan: 68 year old  with progressive ovarian carcinoma. Greater than 30 minutes face to face time was done with patient reviewing her CT scan results. We reviewed the report of his CT scan plus CT scans going back to 2014. We also reviewed her CA 125. I do believe that she's doing very well on the 11th.. The biggest issue is the rash that she is having. I believe is very self-limited and per the patient symptomatically she feels better. If Dr. Marko Plume is comfortable with the Alimta I would continue this regimen. We need to be cognizant of the fact that her CA 125 has just slightly risen to ensure that that is a continued trend. Her questions were elicited in answer to her satisfaction. Greater than 30 minutes face to face time was spent with the patient.  Can Lucci A. Alycia Rossetti, MD 08/19/2013, 9:23 AM

## 2013-08-19 NOTE — Patient Instructions (Signed)
Follow up with Dr. Livesay as scheduled. 

## 2013-08-20 ENCOUNTER — Other Ambulatory Visit: Payer: Self-pay

## 2013-08-20 DIAGNOSIS — C569 Malignant neoplasm of unspecified ovary: Secondary | ICD-10-CM

## 2013-08-20 DIAGNOSIS — I8003 Phlebitis and thrombophlebitis of superficial vessels of lower extremities, bilateral: Secondary | ICD-10-CM

## 2013-08-20 DIAGNOSIS — Z86718 Personal history of other venous thrombosis and embolism: Secondary | ICD-10-CM

## 2013-08-20 MED ORDER — DEXAMETHASONE 4 MG PO TABS: ORAL_TABLET | ORAL | Status: DC

## 2013-08-20 NOTE — Progress Notes (Signed)
Spokewith Monique [name] and she is fine with receiving Alimta on 08-27-13 after visit with Dr. Marko Plume.

## 2013-08-22 ENCOUNTER — Telehealth: Payer: Self-pay | Admitting: *Deleted

## 2013-08-22 ENCOUNTER — Ambulatory Visit: Payer: Medicare Other

## 2013-08-22 NOTE — Telephone Encounter (Signed)
Per staff message and POF I have scheduled appts.  JMW  

## 2013-08-27 ENCOUNTER — Encounter: Payer: Self-pay | Admitting: Oncology

## 2013-08-27 ENCOUNTER — Telehealth: Payer: Self-pay | Admitting: Oncology

## 2013-08-27 ENCOUNTER — Ambulatory Visit (HOSPITAL_BASED_OUTPATIENT_CLINIC_OR_DEPARTMENT_OTHER): Payer: Medicare Other | Admitting: Pharmacist

## 2013-08-27 ENCOUNTER — Ambulatory Visit (HOSPITAL_BASED_OUTPATIENT_CLINIC_OR_DEPARTMENT_OTHER): Payer: Medicare Other

## 2013-08-27 ENCOUNTER — Ambulatory Visit (HOSPITAL_BASED_OUTPATIENT_CLINIC_OR_DEPARTMENT_OTHER): Payer: Medicare Other | Admitting: Oncology

## 2013-08-27 VITALS — BP 121/75 | HR 71 | Temp 98.1°F | Resp 18 | Ht 67.0 in | Wt 151.9 lb

## 2013-08-27 DIAGNOSIS — C569 Malignant neoplasm of unspecified ovary: Secondary | ICD-10-CM

## 2013-08-27 DIAGNOSIS — Z86718 Personal history of other venous thrombosis and embolism: Secondary | ICD-10-CM

## 2013-08-27 DIAGNOSIS — C787 Secondary malignant neoplasm of liver and intrahepatic bile duct: Secondary | ICD-10-CM

## 2013-08-27 DIAGNOSIS — L270 Generalized skin eruption due to drugs and medicaments taken internally: Secondary | ICD-10-CM

## 2013-08-27 DIAGNOSIS — Z5111 Encounter for antineoplastic chemotherapy: Secondary | ICD-10-CM

## 2013-08-27 DIAGNOSIS — I6350 Cerebral infarction due to unspecified occlusion or stenosis of unspecified cerebral artery: Secondary | ICD-10-CM

## 2013-08-27 LAB — COMPREHENSIVE METABOLIC PANEL (CC13)
ALBUMIN: 3.6 g/dL (ref 3.5–5.0)
ALT: 9 U/L (ref 0–55)
ANION GAP: 8 meq/L (ref 3–11)
AST: 26 U/L (ref 5–34)
Alkaline Phosphatase: 124 U/L (ref 40–150)
BILIRUBIN TOTAL: 0.31 mg/dL (ref 0.20–1.20)
BUN: 25.3 mg/dL (ref 7.0–26.0)
CALCIUM: 10.3 mg/dL (ref 8.4–10.4)
CHLORIDE: 110 meq/L — AB (ref 98–109)
CO2: 20 meq/L — AB (ref 22–29)
Creatinine: 1 mg/dL (ref 0.6–1.1)
GLUCOSE: 110 mg/dL (ref 70–140)
POTASSIUM: 4.9 meq/L (ref 3.5–5.1)
SODIUM: 138 meq/L (ref 136–145)
Total Protein: 7.6 g/dL (ref 6.4–8.3)

## 2013-08-27 LAB — CBC WITH DIFFERENTIAL/PLATELET
BASO%: 0 % (ref 0.0–2.0)
Basophils Absolute: 0 10*3/uL (ref 0.0–0.1)
EOS%: 0 % (ref 0.0–7.0)
Eosinophils Absolute: 0 10*3/uL (ref 0.0–0.5)
HCT: 34.3 % — ABNORMAL LOW (ref 34.8–46.6)
HGB: 11.1 g/dL — ABNORMAL LOW (ref 11.6–15.9)
LYMPH#: 0.7 10*3/uL — AB (ref 0.9–3.3)
LYMPH%: 9.9 % — ABNORMAL LOW (ref 14.0–49.7)
MCH: 30.3 pg (ref 25.1–34.0)
MCHC: 32.4 g/dL (ref 31.5–36.0)
MCV: 93.7 fL (ref 79.5–101.0)
MONO#: 0.7 10*3/uL (ref 0.1–0.9)
MONO%: 9.6 % (ref 0.0–14.0)
NEUT%: 80.5 % — ABNORMAL HIGH (ref 38.4–76.8)
NEUTROS ABS: 6 10*3/uL (ref 1.5–6.5)
Platelets: 232 10*3/uL (ref 145–400)
RBC: 3.66 10*6/uL — AB (ref 3.70–5.45)
RDW: 15.5 % — ABNORMAL HIGH (ref 11.2–14.5)
WBC: 7.4 10*3/uL (ref 3.9–10.3)

## 2013-08-27 LAB — POCT INR: INR: 2.9

## 2013-08-27 LAB — PROTIME-INR
INR: 2.9 (ref 2.00–3.50)
PROTIME: 34.8 s — AB (ref 10.6–13.4)

## 2013-08-27 MED ORDER — DEXAMETHASONE SODIUM PHOSPHATE 10 MG/ML IJ SOLN
INTRAMUSCULAR | Status: AC
  Filled 2013-08-27: qty 1

## 2013-08-27 MED ORDER — DEXAMETHASONE SODIUM PHOSPHATE 10 MG/ML IJ SOLN
10.0000 mg | Freq: Once | INTRAMUSCULAR | Status: AC
  Administered 2013-08-27: 10 mg via INTRAVENOUS

## 2013-08-27 MED ORDER — ONDANSETRON 8 MG/50ML IVPB (CHCC)
8.0000 mg | Freq: Once | INTRAVENOUS | Status: AC
  Administered 2013-08-27: 8 mg via INTRAVENOUS

## 2013-08-27 MED ORDER — SODIUM CHLORIDE 0.9 % IV SOLN
300.0000 mg/m2 | Freq: Once | INTRAVENOUS | Status: AC
  Administered 2013-08-27: 550 mg via INTRAVENOUS
  Filled 2013-08-27: qty 22

## 2013-08-27 MED ORDER — ONDANSETRON 8 MG/NS 50 ML IVPB
INTRAVENOUS | Status: AC
  Filled 2013-08-27: qty 8

## 2013-08-27 MED ORDER — METHYLPREDNISOLONE 4 MG PO TABS: ORAL_TABLET | ORAL | Status: DC

## 2013-08-27 MED ORDER — SODIUM CHLORIDE 0.9 % IV SOLN
Freq: Once | INTRAVENOUS | Status: AC
  Administered 2013-08-27: 11:00:00 via INTRAVENOUS

## 2013-08-27 NOTE — Telephone Encounter (Signed)
, °

## 2013-08-27 NOTE — Progress Notes (Signed)
INR within goal today. Hg/Hct:  11.1/34.3, Pltc = 232 No changes in diet or medications. Pt was given a prescription for methylprednisolone (PRN skin irritation/itching from chemo). Pt has been eating more - gained some weight - types of foods has not changed. No missed doses. No problems or concerns regarding anticoagulation. No s/s of clotting noted. Pt does not like to alter her coumadin dose because it causes phlebitis. Continue 5 mg daily except 2.5 mg on Fridays. Return on 09/12/13; lab at 1:15pm, Dr. Marko Plume at 1:45pm and Coumadin clinic at 2:15pm.   Pt will call us if she begins the Methylprednisolone prescription before her next scheduled appointment.

## 2013-08-27 NOTE — Patient Instructions (Signed)
Continue 5 mg daily except 2.5 mg on Fridays. Return on 09/12/13; lab at 1:15pm, Dr. Marko Plume at 1:45pm and Coumadin clinic at 2:15pm.   Call us if you begin the Methylprednisolone prescription.

## 2013-08-27 NOTE — Progress Notes (Signed)
OFFICE PROGRESS NOTE   08/27/2013   Physicians:P.Clayton Bibles, MD (PCP [facility])/ [place], Kara Pacer   INTERVAL HISTORY:  Patient is seen, alone for visit, in continuing attention to metastatic ovarian cancer involving liver, which is responding to Alimta both by repeat imaging and by dramatic drop in marker. Alimta treatments have been complicated by rash, and course prior to Alimta complicated by embolic CVAs, hypercoagulable state and low EF. She had CT AP on 08-18-13, which had significant decrease in number and size of diffuse liver mets and resolution of small ascites, otherwise fairly stable. She saw  Dr Alycia Rossetti on 08-19-13, that exam with 6 cm pelvic mass.  Patient has felt fairly well since she was here last, with rash much improved and not puritic now. She has had no bleeding and no tender cords in LE. Index fingers bilaterally are bruised from using dental floss. She has no GERD now and would like to hold protonix other than when using steroids.  She has IVC filter. Peripheral venous access is fine for present chemo.   ONCOLOGIC HISTORY   Ovarian cancer   06/12/2012 Initial Diagnosis Ovarian cancer   06/21/2012 - 08/16/2012 Chemotherapy dose dense paclitaxel and carboplatin   09/10/2012 Surgery suboptimal debulking    Chemotherapy liposomal dox planned but EF 15%   12/02/2012 - 04/28/2013 Chemotherapy gemcitabine x 10 cycles   05/22/2013 -  Chemotherapy Alimta   Patient developed abdominal symptoms in May 2013, saw GI Aug 2013, but was unable to tolerate prep for colonoscopy. She developed LLE superficial phlebitis and swelling in Jan 2014 , with acute thrombus left common femoral vein. CT AP 05-29-2012 had extensive ascites, small pleural effusions, focal PE, masses near dome of liver and omentum, complex mass 12.8 x 6.6 cm in pelvis, extensive pelvic DVT with probable chronic thrombus in IVC, multiple small retroperitoneal nodes, left  inguinal node 1.8 x 1.6 cm and right inguinal node 2.2 x 1.5 cm. She was begun on lovenox and transitioned to Xarelto by PCP. CA 125 was 569. She saw Dr Alycia Rossetti on 06-12-12, with 15 cm fixed pelvic mass. US paracentesis by IR 06-12-12 for 2.8 liters had cytology ( NZB14 - 126) positive for adenocarcinoma. Neoadjuvant dose dense carbo/taxol was given x 3 cycles 06-21-12 thru 08-16-12, with good partial response by CT 08-19-12 and CA 125 down from 1041 in Feb 2014 to 92 on 08-21-12. IVC filter placed prior to exploratory laparotomy with extensive lysis of adhesions, left ureteral lysis, and bilateral salpingo-oophorectomy by Dr Alycia Rossetti on 09-10-12, suboptimal debulking. Operative findings:diffuse carcinomatosis of the residual omentum involving the entire transverse colon. Transverse colon densely adherent to the anterior abdominal wall. No disease throughout the small bowel. Mesenteric adenopathy with lymph nodes measuring 1 cm, involving the root of the small bowel mesentery. ~5 centimeter right ovarian mass and ~ 6 cm left ovarian mass. Small bowel densely adherent to the pelvis. 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 Chemotherapy resumed with dose dense taxol carboplatin on 09-27-12, completing cycle 5 on 11-08-12. CA 125 increased from 92 in April to 130 post op in June, then 225 late July and repeat 318. CT CAP done 11-19-12 showed increase in nodular  thickening along falciform ligament and small peritoneal nodule right iliac fossa. Plan was to change in chemotherapy to doxil with avastin, however echocardiogram 11-27-12 unexpectedly had EF of 15%. She received one cycle of gemzar on 12-02-12, given  peripherally, then had embolic CVAs on 9-98-33 while still on xarelto, with bleeding into the embolic CVA , and treated with heparin transitioned to coumadin. She was stable to resume gemzar with cycle 2 at 800 mg/m2 given on 12-30-12,then platelets down to 70k by day 8. She had progressive superficial thrombophlebitis LE and was changed to full dose lovenox then; there was difficulty getting lovenox covered by insurance. Cycle 3 gemzar 01-13-13 was dose reduced to 600 mg/m2, however platelets still dropped to 90k; she had recurrent embolic CVAs on 12-17-03 and was hospitalized thru 01-28-13, treated with continuous heparin transitioned back to coumadin, plus baby ASA daily. Gemzar was subsequently tolerated well at 500 mg/m2, without significant thrombocytopenia, total 10 cycles given thru 04-28-13. She progressed in liver and nodes by CT 05-13-13. She wanted to continue treatment, with Alimta chosen due to comorbidities, begun 05-22-13. Platelets were 43k by day 9 cycle 1; neulasta was given day 8. Cycle 2 Alimta was dose reduced by 25% due to platelet nadir. CA 125 improved from >3000 at start of Alimta to 205 in late March prior to cycle 4.   Review of systems as above, also: No new neurologic symptoms. No nausea. No mucositis. Bowels moving well. No increased SOB and no chest pain. Has been able to do some yard work at [employer]. Remainder of 10 point Review of Systems negative.  Objective:  Vital signs in last 24 hours:  BP 121/75  Pulse 71  Temp(Src) 98.1 F (36.7 C) (Oral)  Resp 18  Ht 5\' 7"  (1.702 m)  Wt 151 lb 14.4 oz (68.901 kg)  BMI 23.79 kg/m2  SpO2 98%  Alert, oriented and appropriate. Ambulatory without assistance.  Alopecia  HEENT:PERRL, sclerae not icteric. Oral mucosa moist without lesions, posterior pharynx clear.  Neck supple. No JVD.  Lymphatics:no cervical,suraclavicular, inguinal adenopathy Resp: clear to auscultation bilaterally and normal percussion bilaterally Cardio:  regular rate and rhythm. No gallop. GI: soft, nontender, not distended, no mass or organomegaly. Normally active bowel sounds. Surgical incision not remarkable. Musculoskeletal/ Extremities: without pitting edema, new or tender cords. Superficial varicosities bilateral LE as previously.  Neuro: no peripheral neuropathy. Otherwise nonfocal. Mood and affect appropriate Skin hyperpigmentation upper arms in area of previous rash. Discoloration index fingertips bilaterally which may be traua.   Lab Results:  Results for orders placed in visit on 08/27/13  PROTIME-INR      Result Value Ref Range   Protime 34.8 (*) 10.6 - 13.4 Seconds   INR 2.90  2.00 - 3.50   Lovenox No      CBC and CMET from 08-27-13 noted.  Studies/Results:     CT ABDOMEN AND PELVIS WITH CONTRAST  08-18-13  COMPARISON: 05/13/13  FINDINGS:  Diffuse liver metastases shows significant decrease in number and  size compared to prior exam. Previously noted index lesion in the  superior left hepatic lobe is no longer visualized on the current  exam. Another index lesion in the right hepatic lobe measures 1.5 cm  on image 21 compared with approximately 2.5 cm on previous exam.  Mild ascites has resolved since prior exam. Numerous small less than  1 cm lymph nodes are implants in the central small bowel mesentery  show no significant change. A central pelvic soft tissue  mass  currently measures 5.1 x 5.4 cm on image 66 compared to 5.5 x 5.4 cm  previously.  Cholelithiasis again demonstrated, however there is no evidence of  cholecystitis. The pancreas, spleen, adrenal glands, and kidneys are  normal in appearance. No evidence hydronephrosis. IVC filter remains  in place. No evidence of acute inflammatory process or abscess. No  evidence of bowel obstruction. No suspicious bone lesions  identified.  IMPRESSION:  Significant interval decrease in diffuse liver metastases since  prior study.  Mild decrease in size of central  pelvic mass. Interval resolution of  ascites.  Stable tiny less than 1 cm mesenteric lymph nodes versus peritoneal  tumor.  No new or progressive disease identified within the abdomen or  pelvis.  Cholelithiasis. No radiographic evidence of cholecystitis.     Medications: I have reviewed the patient's current medications. She will have medrol dose pack available to start if puritic rash is significant; she will use Sarna lotion and topical hydrocortisone prn.  Coumadin 5 mg daily except 2.5 mg on Mondays   DISCUSSION: patient is very motivated to continue treatment with Alimta, dose back to 300 mg/m2 this cycle, which I have confirmed with [facility] pharmacist.  Assessment/Plan:  1. 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 Marked improvement in CA125 and in liver mets with Alimta since 07-06-95, tho complicated by progressive skin rash and intermittent thrombocytopenia. Cycle 5 alimta today. She will begin medrol dose pack and let us know if rash severe, may need to be seen if so. 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 IVC filter in. 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  Note superficial clots much better since appears to be responding to Alimta. Appointment with [facility] 08-28-13.  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 She is not active but otherwise not usually symptomatic.  4.DNR patient's request but still treating in attempt to control disease. HCPOA is friend Clair Gulling.  5.long past tobacco with COPD 6.discoloration index fingers: I had thought possibly embolic, however may be just trauma from wrapping with dental floss.   I will see her back on 5-22 and she will have cycle 6 alimta (with B12) on 5-29 if stable.     Gordy Levan, MD   08/27/2013, 9:48 AM

## 2013-08-27 NOTE — Patient Instructions (Signed)
[facility] Discharge Instructions for Patients Receiving Chemotherapy  Today you received the following chemotherapy agents Alimta  To help prevent nausea and vomiting after your treatment, we encourage you to take your nausea medication     If you develop nausea and vomiting that is not controlled by your nausea medication, call the clinic.   BELOW ARE SYMPTOMS THAT SHOULD BE REPORTED IMMEDIATELY:  *FEVER GREATER THAN 100.5 F  *CHILLS WITH OR WITHOUT FEVER  NAUSEA AND VOMITING THAT IS NOT CONTROLLED WITH YOUR NAUSEA MEDICATION  *UNUSUAL SHORTNESS OF BREATH  *UNUSUAL BRUISING OR BLEEDING  TENDERNESS IN MOUTH AND THROAT WITH OR WITHOUT PRESENCE OF ULCERS  *URINARY PROBLEMS  *BOWEL PROBLEMS  UNUSUAL RASH Items with * indicate a potential emergency and should be followed up as soon as possible.  Feel free to call the clinic you have any questions or concerns. The clinic phone number is [phone].    

## 2013-08-28 ENCOUNTER — Ambulatory Visit (HOSPITAL_BASED_OUTPATIENT_CLINIC_OR_DEPARTMENT_OTHER): Payer: Medicare Other

## 2013-08-28 ENCOUNTER — Other Ambulatory Visit: Payer: Self-pay

## 2013-08-28 ENCOUNTER — Ambulatory Visit: Payer: Medicare Other | Admitting: Nurse Practitioner

## 2013-08-28 DIAGNOSIS — Z5189 Encounter for other specified aftercare: Secondary | ICD-10-CM

## 2013-08-28 DIAGNOSIS — C787 Secondary malignant neoplasm of liver and intrahepatic bile duct: Secondary | ICD-10-CM

## 2013-08-28 DIAGNOSIS — C569 Malignant neoplasm of unspecified ovary: Secondary | ICD-10-CM

## 2013-08-28 MED ORDER — PEGFILGRASTIM INJECTION 6 MG/0.6ML
6.0000 mg | Freq: Once | SUBCUTANEOUS | Status: AC
  Administered 2013-08-28: 6 mg via SUBCUTANEOUS
  Filled 2013-08-28: qty 0.6

## 2013-08-28 MED ORDER — PEGFILGRASTIM INJECTION 6 MG/0.6ML
6.0000 mg | Freq: Once | SUBCUTANEOUS | Status: DC
  Filled 2013-08-28: qty 0.6

## 2013-08-28 NOTE — Patient Instructions (Addendum)

## 2013-08-29 ENCOUNTER — Other Ambulatory Visit: Payer: Self-pay | Admitting: Oncology

## 2013-08-29 ENCOUNTER — Telehealth: Payer: Self-pay | Admitting: *Deleted

## 2013-08-29 DIAGNOSIS — Z86718 Personal history of other venous thrombosis and embolism: Secondary | ICD-10-CM

## 2013-08-29 NOTE — Telephone Encounter (Signed)
Patieent called and moved her appt from 5/27 to 5/29

## 2013-09-02 ENCOUNTER — Telehealth: Payer: Self-pay | Admitting: Oncology

## 2013-09-02 ENCOUNTER — Telehealth: Payer: Self-pay

## 2013-09-02 NOTE — Telephone Encounter (Signed)
ENCOUNTER OPENED IN ERROR

## 2013-09-02 NOTE — Telephone Encounter (Signed)
, °

## 2013-09-02 NOTE — Telephone Encounter (Signed)
Told Ms. Peltzer Dr. Mariana Kaufman suggestion of Claritin as noted below. Ms. Kauzlarich verbalized understanding.  She has not developed a rash this time thus far. Told her that Dr. Marko Plume sent an order to the schedulers to set up an appointment with the coumadin clinic for 5-14 or 5-15 to check her INR. Suggested that Ms. Charters call the [facility] scheduling dept 09-03-13 midday  if she has not received an appointment by that time.

## 2013-09-02 NOTE — Telephone Encounter (Signed)
Message copied by Baruch Merl on Tue Sep 02, 2013 12:36 PM ------      Message from: Gordy Levan      Created: Fri Aug 29, 2013 11:26 PM       #2 message on Ms.Sunderlin -- please suggest that she try claritin 10 mg daily in addition to other meds if the itchy rash happens after this Alimta.            Cc LA, TH ------

## 2013-09-04 ENCOUNTER — Other Ambulatory Visit (HOSPITAL_BASED_OUTPATIENT_CLINIC_OR_DEPARTMENT_OTHER): Payer: Medicare Other

## 2013-09-04 ENCOUNTER — Ambulatory Visit (HOSPITAL_BASED_OUTPATIENT_CLINIC_OR_DEPARTMENT_OTHER): Payer: Medicare Other | Admitting: Pharmacist

## 2013-09-04 DIAGNOSIS — I639 Cerebral infarction, unspecified: Secondary | ICD-10-CM

## 2013-09-04 DIAGNOSIS — Z86718 Personal history of other venous thrombosis and embolism: Secondary | ICD-10-CM

## 2013-09-04 DIAGNOSIS — Z7901 Long term (current) use of anticoagulants: Secondary | ICD-10-CM

## 2013-09-04 LAB — PROTIME-INR
INR: 2.5 (ref 2.00–3.50)
Protime: 30 Seconds — ABNORMAL HIGH (ref 10.6–13.4)

## 2013-09-04 LAB — POCT INR: INR: 2.5

## 2013-09-04 NOTE — Progress Notes (Signed)
INR = 2.5     Goal 2-3 INR therapeutic today. No new medications or dietary changes. Patient has a Medrol dosepack that she is to use if she continues to have a rash from pemetrexed. She has not yet had to use it and the rash is resolving. She will notify us if she starts Medrol She states that she never knows if a drug will interact with Coumadin or not; she had an elevated INR while on fluconazole. I advised her to call the Coumadin Clinic if she starts any new medications. She has had no complications of anticoagulation other some mild bruising. She will continue 5 mg daily except 2.5 mg on Fridays She will return on 09/12/13; lab at 1:15pm, Dr. Marko Plume at 1:45pm and Coumadin clinic at 2:15pm.    Theone Murdoch, PharmD

## 2013-09-10 ENCOUNTER — Other Ambulatory Visit: Payer: Self-pay | Admitting: Oncology

## 2013-09-12 ENCOUNTER — Ambulatory Visit (HOSPITAL_BASED_OUTPATIENT_CLINIC_OR_DEPARTMENT_OTHER): Payer: Medicare Other | Admitting: Oncology

## 2013-09-12 ENCOUNTER — Other Ambulatory Visit (HOSPITAL_BASED_OUTPATIENT_CLINIC_OR_DEPARTMENT_OTHER): Payer: Medicare Other

## 2013-09-12 ENCOUNTER — Ambulatory Visit: Payer: Medicare Other

## 2013-09-12 ENCOUNTER — Telehealth: Payer: Self-pay | Admitting: Oncology

## 2013-09-12 ENCOUNTER — Encounter: Payer: Self-pay | Admitting: Oncology

## 2013-09-12 VITALS — BP 118/73 | HR 66 | Temp 99.2°F | Resp 17 | Ht 67.0 in | Wt 151.4 lb

## 2013-09-12 DIAGNOSIS — C779 Secondary and unspecified malignant neoplasm of lymph node, unspecified: Secondary | ICD-10-CM

## 2013-09-12 DIAGNOSIS — D6959 Other secondary thrombocytopenia: Secondary | ICD-10-CM

## 2013-09-12 DIAGNOSIS — Z8673 Personal history of transient ischemic attack (TIA), and cerebral infarction without residual deficits: Secondary | ICD-10-CM

## 2013-09-12 DIAGNOSIS — I808 Phlebitis and thrombophlebitis of other sites: Secondary | ICD-10-CM

## 2013-09-12 DIAGNOSIS — C569 Malignant neoplasm of unspecified ovary: Secondary | ICD-10-CM

## 2013-09-12 DIAGNOSIS — Z87891 Personal history of nicotine dependence: Secondary | ICD-10-CM

## 2013-09-12 DIAGNOSIS — I428 Other cardiomyopathies: Secondary | ICD-10-CM

## 2013-09-12 DIAGNOSIS — J449 Chronic obstructive pulmonary disease, unspecified: Secondary | ICD-10-CM

## 2013-09-12 DIAGNOSIS — Z86718 Personal history of other venous thrombosis and embolism: Secondary | ICD-10-CM

## 2013-09-12 DIAGNOSIS — C787 Secondary malignant neoplasm of liver and intrahepatic bile duct: Secondary | ICD-10-CM

## 2013-09-12 LAB — COMPREHENSIVE METABOLIC PANEL (CC13)
ALK PHOS: 153 U/L — AB (ref 40–150)
ALT: 12 U/L (ref 0–55)
AST: 27 U/L (ref 5–34)
Albumin: 3.5 g/dL (ref 3.5–5.0)
Anion Gap: 12 mEq/L — ABNORMAL HIGH (ref 3–11)
BUN: 20.5 mg/dL (ref 7.0–26.0)
CALCIUM: 9.8 mg/dL (ref 8.4–10.4)
CO2: 24 mEq/L (ref 22–29)
Chloride: 103 mEq/L (ref 98–109)
Creatinine: 1.1 mg/dL (ref 0.6–1.1)
GLUCOSE: 91 mg/dL (ref 70–140)
Potassium: 5 mEq/L (ref 3.5–5.1)
Sodium: 139 mEq/L (ref 136–145)
Total Bilirubin: 0.31 mg/dL (ref 0.20–1.20)
Total Protein: 7.4 g/dL (ref 6.4–8.3)

## 2013-09-12 LAB — CBC WITH DIFFERENTIAL/PLATELET
BASO%: 0.8 % (ref 0.0–2.0)
BASOS ABS: 0.1 10*3/uL (ref 0.0–0.1)
EOS%: 1.6 % (ref 0.0–7.0)
Eosinophils Absolute: 0.1 10*3/uL (ref 0.0–0.5)
HEMATOCRIT: 35.2 % (ref 34.8–46.6)
HGB: 11.2 g/dL — ABNORMAL LOW (ref 11.6–15.9)
LYMPH%: 10.5 % — ABNORMAL LOW (ref 14.0–49.7)
MCH: 30.5 pg (ref 25.1–34.0)
MCHC: 31.9 g/dL (ref 31.5–36.0)
MCV: 95.5 fL (ref 79.5–101.0)
MONO#: 0.9 10*3/uL (ref 0.1–0.9)
MONO%: 11.2 % (ref 0.0–14.0)
NEUT%: 75.9 % (ref 38.4–76.8)
NEUTROS ABS: 6.1 10*3/uL (ref 1.5–6.5)
PLATELETS: 282 10*3/uL (ref 145–400)
RBC: 3.68 10*6/uL — ABNORMAL LOW (ref 3.70–5.45)
RDW: 15.4 % — ABNORMAL HIGH (ref 11.2–14.5)
WBC: 8 10*3/uL (ref 3.9–10.3)
lymph#: 0.8 10*3/uL — ABNORMAL LOW (ref 0.9–3.3)

## 2013-09-12 LAB — PROTIME-INR
INR: 2.7 (ref 2.00–3.50)
Protime: 32.4 Seconds — ABNORMAL HIGH (ref 10.6–13.4)

## 2013-09-12 NOTE — Telephone Encounter (Signed)
per pof to sch appt-gave pt copy of sch °

## 2013-09-12 NOTE — Progress Notes (Signed)
OFFICE PROGRESS NOTE   09/12/2013   Physicians:P.Clayton Bibles, MD (PCP [facility])/ [place], Daneen Schick, Antony Contras   INTERVAL HISTORY:   Patient is seen, alone for visit, in continuing attention to metastatic ovarian carcinoma involving liver for which she is being treated with Alimta. Repeat CT AP 08-18-13 compared with 04-2013 showed significant improvement in extensive liver mets, resolution of mild ascites, slight decrease in size of pelvic mass and stable lymph nodes/ implants in small bowel mesentery. She saw Dr Alycia Rossetti shortly after that CT, with no changes recommended now. She had cycle 5 Alimta on 08-27-13 with neulasta on 08-28-13; she was more fatigued for ~ a full week after most recent treatment, tho feels this was manageable. She had minimal rash this cycle. CA 125 was 264 in late April, having been 205 in March and 3100 in Feb.  She continues coumadin for recurrent thrombophlebitis and embolic CVAs, with therapeutic INR and no problems in this regard today.  She is to see Dr Tamala Julian in early June in follow up of cardiomyopathy.  She does not have PAC  ONCOLOGIC HISTORY   Ovarian cancer   06/12/2012 Initial Diagnosis Ovarian cancer   06/21/2012 - 08/16/2012 Chemotherapy dose dense paclitaxel and carboplatin   09/10/2012 Surgery suboptimal debulking    Chemotherapy liposomal dox planned but EF 15%   12/02/2012 - 04/28/2013 Chemotherapy gemcitabine x 10 cycles   05/22/2013 -  Chemotherapy Alimta   Patient developed abdominal symptoms in May 2013, saw GI Aug 2013, but was unable to tolerate prep for colonoscopy. She developed LLE superficial phlebitis and swelling in Jan 2014 , with acute thrombus left common femoral vein. CT AP 05-29-2012 had extensive ascites, small pleural effusions, focal PE, masses near dome of liver and omentum, complex mass 12.8 x 6.6 cm in pelvis, extensive pelvic DVT with probable chronic thrombus in IVC, multiple small  retroperitoneal nodes, left inguinal node 1.8 x 1.6 cm and right inguinal node 2.2 x 1.5 cm. She was begun on lovenox and transitioned to Xarelto by PCP. CA 125 was 569. She saw Dr Alycia Rossetti on 06-12-12, with 15 cm fixed pelvic mass. US paracentesis by IR 06-12-12 for 2.8 liters had cytology ( NZB14 - 126) positive for adenocarcinoma. Neoadjuvant dose dense carbo/taxol was given x 3 cycles 06-21-12 thru 08-16-12, with good partial response by CT 08-19-12 and CA 125 down from 1041 in Feb 2014 to 92 on 08-21-12. IVC filter placed prior to exploratory laparotomy with extensive lysis of adhesions, left ureteral lysis, and bilateral salpingo-oophorectomy by Dr Alycia Rossetti on 09-10-12, suboptimal debulking. Operative findings:diffuse carcinomatosis of the residual omentum involving the entire transverse colon. Transverse colon densely adherent to the anterior abdominal wall. No disease throughout the small bowel. Mesenteric adenopathy with lymph nodes measuring 1 cm, involving the root of the small bowel mesentery. ~5 centimeter right ovarian mass and ~ 6 cm left ovarian mass. Small bowel densely adherent to the pelvis. 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 Chemotherapy resumed with dose dense taxol carboplatin on 09-27-12, completing cycle 5 on 11-08-12. CA 125 increased from 92 in April to 130 post op in June, then 225 late July and repeat 318. CT CAP done 11-19-12 showed  increase in nodular thickening along falciform ligament and small peritoneal nodule right iliac fossa. Plan was to change in chemotherapy to doxil with avastin, however echocardiogram 11-27-12 unexpectedly had EF of 15%. She received one cycle of  gemzar on 12-02-12, given peripherally, then had embolic CVAs on 8-41-66 while still on xarelto, with bleeding into the embolic CVA , and treated with heparin transitioned to coumadin. She was stable to resume gemzar with cycle 2 at 800 mg/m2 given on 12-30-12,then platelets down to 70k by day 8. She had progressive superficial thrombophlebitis LE and was changed to full dose lovenox then; there was difficulty getting lovenox covered by insurance. Cycle 3 gemzar 01-13-13 was dose reduced to 600 mg/m2, however platelets still dropped to 90k; she had recurrent embolic CVAs on 0-63-01 and was hospitalized thru 01-28-13, treated with continuous heparin transitioned back to coumadin, plus baby ASA daily. Gemzar was subsequently tolerated well at 500 mg/m2, without significant thrombocytopenia, total 10 cycles given thru 04-28-13. She progressed in liver and nodes by CT 05-13-13. She wanted to continue treatment, with Alimta chosen due to comorbidities, begun 05-22-13. Platelets were 43k by day 9 cycle 1; neulasta was given day 8. Cycle 2 Alimta was dose reduced by 25% due to platelet nadir. CA 125 improved from >3000 at start of Alimta to 205 in late March prior to cycle 4.   Review of systems as above, also: More discoloration of index fingers intermittently, possibly Raynaud's or embolic. Bowels and bladder ok. Only abdominal discomfort is same occasional RUQ pain, unchanged. No phlebitis symptoms LE. Able to do some yard work. No increased SOB. Remainder of 10 point Review of Systems negative.  Objective:  Vital signs in last 24 hours:  BP 118/73  Pulse 66  Temp(Src) 99.2 F (37.3 C)  Resp 17  Ht 5\' 7"  (1.702 m)  Wt 151 lb 6.4 oz (68.675 kg)  BMI 23.71 kg/m2  SpO2 97% Weight is up 2 lbs. Alert, oriented and appropriate. Ambulatory without difficulty.  Alopecia  HEENT:PERRL, sclerae not icteric. Oral mucosa moist without lesions, posterior pharynx clear.  Neck supple. No JVD.  Lymphatics:no  cervical,suraclavicular or inguinal adenopathy Resp: clear to auscultation bilaterally and normal percussion bilaterally Cardio: regular rate and rhythm. No gallop. GI: soft, nontender including RUQ, not distended, no mass or organomegaly. Normally active bowel sounds. Surgical incision not remarkable. Musculoskeletal/ Extremities: without pitting edema, cords, tenderness. Prominent varicose veins LE bilaterally. Index fingers bluish to second mcp joint as previously. Neuro: no peripheral neuropathy. Speech fluent, no focal deficits otherwise Skin without rash, ecchymosis, petechiae   Lab Results:  Results for orders placed in visit on 09/12/13  CBC WITH DIFFERENTIAL      Result Value Ref Range   WBC 8.0  3.9 - 10.3 10e3/uL   NEUT# 6.1  1.5 - 6.5 10e3/uL   HGB 11.2 (*) 11.6 - 15.9 g/dL   HCT 35.2  34.8 - 46.6 %   Platelets 282  145 - 400 10e3/uL   MCV 95.5  79.5 - 101.0 fL   MCH 30.5  25.1 - 34.0 pg   MCHC 31.9  31.5 - 36.0 g/dL   RBC 3.68 (*) 3.70 - 5.45 10e6/uL   RDW 15.4 (*) 11.2 - 14.5 %   lymph# 0.8 (*) 0.9 - 3.3 10e3/uL   MONO# 0.9  0.1 - 0.9 10e3/uL   Eosinophils Absolute 0.1  0.0 - 0.5 10e3/uL   Basophils Absolute 0.1  0.0 - 0.1 10e3/uL   NEUT% 75.9  38.4 -  76.8 %   LYMPH% 10.5 (*) 14.0 - 49.7 %   MONO% 11.2  0.0 - 14.0 %   EOS% 1.6  0.0 - 7.0 %   BASO% 0.8  0.0 - 2.0 %  COMPREHENSIVE METABOLIC PANEL (JQ30)      Result Value Ref Range   Sodium 139  136 - 145 mEq/L   Potassium 5.0  3.5 - 5.1 mEq/L   Chloride 103  98 - 109 mEq/L   CO2 24  22 - 29 mEq/L   Glucose 91  70 - 140 mg/dl   BUN 20.5  7.0 - 26.0 mg/dL   Creatinine 1.1  0.6 - 1.1 mg/dL   Total Bilirubin 0.31  0.20 - 1.20 mg/dL   Alkaline Phosphatase 153 (*) 40 - 150 U/L   AST 27  5 - 34 U/L   ALT 12  0 - 55 U/L   Total Protein 7.4  6.4 - 8.3 g/dL   Albumin 3.5  3.5 - 5.0 g/dL   Calcium 9.8  8.4 - 10.4 mg/dL   Anion Gap 12 (*) 3 - 11 mEq/L  PROTIME-INR      Result Value Ref Range   Protime 32.4 (*) 10.6  - 13.4 Seconds   INR 2.70  2.00 - 3.50   Lovenox No       Studies/Results:  No results found.  Medications: I have reviewed the patient's current medications. Continue same coumadin, 5 mg daily except 2.5 mg on Fridays  DISCUSSION: Patient feels all side effects are very tolerable and strongly desires to continue treatment  Assessment/Plan:  1. 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 Marked improvement in CA125 and in liver mets with Alimta since 05-22-13. Rash minimal with most recent treatment and platelets maintaining on present dose. She will have cycle 6 on 09-19-13 as long as ANC >=1.5 and plt >=100k, with neulasta on 5-30. I will see her ~ 2 weeks after that treatment with labs.  2.embolic CVAs fall 0923 and recurrent episodes of superficial thrombophlebitis + previous DVT. IVC filter in. 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 Note superficial clots much better since appears to be responding to Alimta. Also on ASA. 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 She is not active but otherwise not usually symptomatic.  4.DNR patient's request but still treating in attempt to control disease. HCPOA is friend Clair Gulling.  5.long past tobacco with COPD  6.discoloration index fingers: ? Raynaud's vs embolic vs trauma (including dental floss)  Patient has had all questions answered and is in agreement with plan.  Gordy Levan, MD   09/12/2013, 2:05 PM

## 2013-09-13 LAB — CA 125: CA 125: 447.1 U/mL — ABNORMAL HIGH (ref 0.0–30.2)

## 2013-09-14 ENCOUNTER — Other Ambulatory Visit: Payer: Self-pay | Admitting: Oncology

## 2013-09-17 ENCOUNTER — Other Ambulatory Visit: Payer: Medicare Other

## 2013-09-17 ENCOUNTER — Ambulatory Visit: Payer: Medicare Other

## 2013-09-19 ENCOUNTER — Other Ambulatory Visit (HOSPITAL_BASED_OUTPATIENT_CLINIC_OR_DEPARTMENT_OTHER): Payer: Medicare Other

## 2013-09-19 ENCOUNTER — Ambulatory Visit (HOSPITAL_BASED_OUTPATIENT_CLINIC_OR_DEPARTMENT_OTHER): Payer: Medicare Other

## 2013-09-19 ENCOUNTER — Ambulatory Visit: Payer: Medicare Other | Admitting: Pharmacist

## 2013-09-19 VITALS — BP 112/74 | HR 77 | Temp 98.4°F | Resp 18

## 2013-09-19 DIAGNOSIS — C569 Malignant neoplasm of unspecified ovary: Secondary | ICD-10-CM

## 2013-09-19 DIAGNOSIS — I808 Phlebitis and thrombophlebitis of other sites: Secondary | ICD-10-CM

## 2013-09-19 DIAGNOSIS — Z86718 Personal history of other venous thrombosis and embolism: Secondary | ICD-10-CM

## 2013-09-19 DIAGNOSIS — C787 Secondary malignant neoplasm of liver and intrahepatic bile duct: Secondary | ICD-10-CM

## 2013-09-19 DIAGNOSIS — Z5111 Encounter for antineoplastic chemotherapy: Secondary | ICD-10-CM

## 2013-09-19 LAB — CBC WITH DIFFERENTIAL/PLATELET
BASO%: 0.1 % (ref 0.0–2.0)
Basophils Absolute: 0 10*3/uL (ref 0.0–0.1)
EOS%: 0.1 % (ref 0.0–7.0)
Eosinophils Absolute: 0 10*3/uL (ref 0.0–0.5)
HCT: 33.1 % — ABNORMAL LOW (ref 34.8–46.6)
HGB: 10.9 g/dL — ABNORMAL LOW (ref 11.6–15.9)
LYMPH%: 7.6 % — AB (ref 14.0–49.7)
MCH: 30.4 pg (ref 25.1–34.0)
MCHC: 32.9 g/dL (ref 31.5–36.0)
MCV: 92.5 fL (ref 79.5–101.0)
MONO#: 0.9 10*3/uL (ref 0.1–0.9)
MONO%: 9 % (ref 0.0–14.0)
NEUT#: 7.9 10*3/uL — ABNORMAL HIGH (ref 1.5–6.5)
NEUT%: 83.2 % — ABNORMAL HIGH (ref 38.4–76.8)
PLATELETS: 274 10*3/uL (ref 145–400)
RBC: 3.58 10*6/uL — AB (ref 3.70–5.45)
RDW: 14.8 % — ABNORMAL HIGH (ref 11.2–14.5)
WBC: 9.4 10*3/uL (ref 3.9–10.3)
lymph#: 0.7 10*3/uL — ABNORMAL LOW (ref 0.9–3.3)

## 2013-09-19 LAB — PROTIME-INR
INR: 2.3 (ref 2.00–3.50)
Protime: 27.6 Seconds — ABNORMAL HIGH (ref 10.6–13.4)

## 2013-09-19 LAB — POCT INR: INR: 2.3

## 2013-09-19 MED ORDER — SODIUM CHLORIDE 0.9 % IV SOLN
Freq: Once | INTRAVENOUS | Status: AC
  Administered 2013-09-19: 11:00:00 via INTRAVENOUS

## 2013-09-19 MED ORDER — ONDANSETRON 8 MG/NS 50 ML IVPB
INTRAVENOUS | Status: AC
  Filled 2013-09-19: qty 8

## 2013-09-19 MED ORDER — CYANOCOBALAMIN 1000 MCG/ML IJ SOLN
1000.0000 ug | Freq: Once | INTRAMUSCULAR | Status: AC
  Administered 2013-09-19: 1000 ug via INTRAMUSCULAR

## 2013-09-19 MED ORDER — ONDANSETRON 8 MG/50ML IVPB (CHCC)
8.0000 mg | Freq: Once | INTRAVENOUS | Status: AC
  Administered 2013-09-19: 8 mg via INTRAVENOUS

## 2013-09-19 MED ORDER — SODIUM CHLORIDE 0.9 % IV SOLN
300.0000 mg/m2 | Freq: Once | INTRAVENOUS | Status: AC
  Administered 2013-09-19: 550 mg via INTRAVENOUS
  Filled 2013-09-19: qty 22

## 2013-09-19 MED ORDER — CYANOCOBALAMIN 1000 MCG/ML IJ SOLN
INTRAMUSCULAR | Status: AC
  Filled 2013-09-19: qty 1

## 2013-09-19 MED ORDER — DEXAMETHASONE SODIUM PHOSPHATE 10 MG/ML IJ SOLN
10.0000 mg | Freq: Once | INTRAMUSCULAR | Status: AC
Start: 2013-09-19 — End: 2013-09-19
  Administered 2013-09-19: 10 mg via INTRAVENOUS

## 2013-09-19 MED ORDER — DEXAMETHASONE SODIUM PHOSPHATE 10 MG/ML IJ SOLN
INTRAMUSCULAR | Status: AC
  Filled 2013-09-19: qty 1

## 2013-09-19 NOTE — Patient Instructions (Signed)
Continue 5 mg daily except 2.5 mg on Mondays. Return on 10/06/13; lab at 8:45am, Dr. Marko Plume at 9:30am and Coumadin clinic at 10:00am.

## 2013-09-19 NOTE — Patient Instructions (Addendum)
[facility] Discharge Instructions for Patients Receiving Chemotherapy  Today you received the following chemotherapy agents: Alimta  To help prevent nausea and vomiting after your treatment, we encourage you to take your nausea medication as prescribed by your physician.    If you develop nausea and vomiting that is not controlled by your nausea medication, call the clinic.   BELOW ARE SYMPTOMS THAT SHOULD BE REPORTED IMMEDIATELY:  *FEVER GREATER THAN 100.5 F  *CHILLS WITH OR WITHOUT FEVER  NAUSEA AND VOMITING THAT IS NOT CONTROLLED WITH YOUR NAUSEA MEDICATION  *UNUSUAL SHORTNESS OF BREATH  *UNUSUAL BRUISING OR BLEEDING  TENDERNESS IN MOUTH AND THROAT WITH OR WITHOUT PRESENCE OF ULCERS  *URINARY PROBLEMS  *BOWEL PROBLEMS  UNUSUAL RASH Items with * indicate a potential emergency and should be followed up as soon as possible.  Feel free to call the clinic you have any questions or concerns. The clinic phone number is [phone].   Vitamin B12 Injections Every person needs vitamin B12. A deficiency develops when the body does not get enough of it. One way to overcome this is by getting B12 shots (injections). A B12 shot puts the vitamin directly into muscle tissue. This avoids any problems your body might have in absorbing it from food or a pill. In some people, the body has trouble using the vitamin correctly. This can cause a B12 deficiency. Not consuming enough of the vitamin can also cause a deficiency. Getting enough vitamin B12 can be hard for elderly people. Sometimes, they do not eat a well-balanced diet. The elderly are also more likely than younger people to have medical conditions or take medications that can lead to a deficiency. WHAT DOES VITAMIN B12 DO? Vitamin B12 does many things to help the body work right:  It helps the body make healthy red blood cells.  It helps maintain nerve cells.  It is involved in the body's process of converting  food into energy (metabolism).  It is needed to make the genetic material in all cells (DNA). VITAMIN B12 FOOD SOURCES Most people get plenty of vitamin B12 through the foods they eat. It is present in:  Meat, fish, poultry, and eggs.  Milk and milk products.  It also is added when certain foods are made, including some breads, cereals and yogurts. The food is then called "fortified". CAUSES The most common causes of vitamin B12 deficiency are:  Pernicious anemia. The condition develops when the body cannot make enough healthy red blood cells. This stems from a lack of a protein made in the stomach (intrinsic factor). People without this protein cannot absorb enough vitamin B12 from food.  Malabsorption. This is when the body cannot absorb the vitamin. It can be caused by:  Pernicious anemia.  Surgery to remove part or all of the stomach can lead to malabsorption. Removal of part or all of the small intestine can also cause malabsorption.  Vegetarian diet. People who are strict about not eating foods from animals could have trouble taking in enough vitamin B12 from diet alone.  Medications. Some medicines have been linked to B12 deficiency, such as Metformin (a drug prescribed for type 2 diabetes). Long-term use of stomach acid suppressants also can keep the vitamin from being absorbed.  Intestinal problems such as inflammatory bowel disease. If there are problems in the digestive tract, vitamin B12 may not be absorbed in good enough amounts. SYMPTOMS People who do not get enough B12 can develop problems. These can include:  Anemia.   This is when the body has too few red blood cells. Red blood cells carry oxygen to the rest of the body. Without a healthy supply of red blood cells, people can feel:  Tired (fatigued).  Weak.  Severe anemia can cause:  Shortness of breath.  Dizziness.  Rapid heart rate.  Paleness.  Other Vitamin B12 deficiency symptoms  include:  Diarrhea.  Numbness or tingling in the hands or feet.  Loss of appetite.  Confusion.  Sores on the tongue or in the mouth. LET YOUR CAREGIVER KNOW ABOUT:  Any allergies. It is very important to know if you are allergic or sensitive to cobalt. Vitamin B12 contains cobalt.  Any history of kidney disease.  All medications you are taking. Include prescription and over-the-counter medicines, herbs and creams.  Whether you are pregnant or breast-feeding.  If you have Leber's disease, a hereditary eye condition, vitamin B12 could make it worse. RISKS AND COMPLICATIONS Reactions to an injection are usually temporary. They might include:  Pain at the injection site.  Redness, swelling or tenderness at the site.  Headache, dizziness or weakness.  Nausea, upset stomach or diarrhea.  Numbness or tingling.  Fever.  Joint pain.  Itching or rash. If a reaction does not go away in a short while, talk with your healthcare provider. A change in the way the shots are given, or where they are given, might need to be made. BEFORE AN INJECTION To decide whether B12 injections are right for you, your healthcare provider will probably:  Ask about your medical history.  Ask questions about your diet.  Ask about symptoms such as:  Have you felt weak?  Do you feel unusually tired?  Do you get dizzy?  Order blood tests. These may include a test to:  Check the level of red cells in your blood.  Measure B12 levels.  Check for the presence of intrinsic factor. VITAMIN B12 INJECTIONS How often you will need a vitamin B12 injection will depend on how severe your deficiency is. This also will affect how long you will need to get them. People with pernicious anemia usually get injections for their entire life. Others might get them for a shorter period. For many people, injections are given daily or weekly for several weeks. Then, once B12 levels are normal, injections are  given just once a month. If the cause of the deficiency can be fixed, the injections can be stopped. Talk with your healthcare provider about what you should expect. For an injection:  The injection site will be cleaned with an alcohol swab.  Your healthcare provider will insert a needle directly into a muscle. Most any muscle can be used. Most often, an arm muscle is used. A buttocks muscle can also be used. Many people say shots in that area are less painful.  A small adhesive bandage may be put over the injection site. It usually can be taken off in an hour or less. Injections can be given by your healthcare provider. In some cases, family members give them. Sometimes, people give them to themselves. Talk with your healthcare provider about what would be best for you. If someone other than your healthcare provider will be giving the shots, the person will need to be trained to give them correctly. HOME CARE INSTRUCTIONS   You can remove the adhesive bandage within an hour of getting a shot.  You should be able to go about your normal activities right away.  Avoid drinking large amounts of   alcohol while taking vitamin B12 shots. Alcohol can interfere with the body's use of the vitamin. SEEK MEDICAL CARE IF:   Pain, redness, swelling or tenderness at the injection site does not get better or gets worse.  Headache, dizziness or weakness does not go away.  You develop a fever of more than 100.5 F (38.1 C). SEEK IMMEDIATE MEDICAL CARE IF:   You have chest pain.  You develop shortness of breath.  You have muscle weakness that gets worse.  You develop numbness, weakness or tingling on one side or one area of the body.  You have symptoms of an allergic reaction, such as:  Hives.  Difficulty breathing.  Swelling of the lips, face, tongue or throat.  You develop a fever of more than 102.0 F (38.9 C). MAKE SURE YOU:   Understand these instructions.  Will watch your  condition.  Will get help right away if you are not doing well or get worse. Document Released: 07/07/2008 Document Revised: 07/03/2011 Document Reviewed: 07/07/2008 [employer] Patient Information 2014 [employer].  

## 2013-09-19 NOTE — Progress Notes (Signed)
INR = 2.3 Goal 2-3  INR therapeutic today.  Reports now taking decadron the day before, day of, and day after treatment. No bleeding or unusual bruising. No changes in diet.   She will continue 5 mg daily except 2.5 mg on Mondays (patient reports she got off schedule and takes 2.5mg  on Mondays now). Patient will return on 10/06/13; lab at 8:45am, Dr. Marko Plume at 9:30am and Coumadin clinic at 10:00am.   Dicky Doe, PharmD, BCPS Clinical Pharmacist 09/19/2013 12:17 PM

## 2013-09-20 ENCOUNTER — Ambulatory Visit (HOSPITAL_BASED_OUTPATIENT_CLINIC_OR_DEPARTMENT_OTHER): Payer: Medicare Other

## 2013-09-20 VITALS — BP 106/69 | HR 59 | Temp 97.7°F

## 2013-09-20 DIAGNOSIS — Z5189 Encounter for other specified aftercare: Secondary | ICD-10-CM

## 2013-09-20 DIAGNOSIS — C787 Secondary malignant neoplasm of liver and intrahepatic bile duct: Secondary | ICD-10-CM

## 2013-09-20 DIAGNOSIS — C569 Malignant neoplasm of unspecified ovary: Secondary | ICD-10-CM

## 2013-09-20 MED ORDER — PEGFILGRASTIM INJECTION 6 MG/0.6ML
6.0000 mg | Freq: Once | SUBCUTANEOUS | Status: AC
  Administered 2013-09-20: 6 mg via SUBCUTANEOUS

## 2013-09-20 NOTE — Patient Instructions (Signed)

## 2013-09-22 ENCOUNTER — Ambulatory Visit (INDEPENDENT_AMBULATORY_CARE_PROVIDER_SITE_OTHER): Payer: Medicare Other | Admitting: Interventional Cardiology

## 2013-09-22 ENCOUNTER — Encounter: Payer: Self-pay | Admitting: Interventional Cardiology

## 2013-09-22 VITALS — BP 110/62 | HR 76 | Ht 67.0 in | Wt 153.0 lb

## 2013-09-22 DIAGNOSIS — I4729 Other ventricular tachycardia: Secondary | ICD-10-CM

## 2013-09-22 DIAGNOSIS — I472 Ventricular tachycardia, unspecified: Secondary | ICD-10-CM

## 2013-09-22 DIAGNOSIS — I5022 Chronic systolic (congestive) heart failure: Secondary | ICD-10-CM

## 2013-09-22 DIAGNOSIS — D6859 Other primary thrombophilia: Secondary | ICD-10-CM

## 2013-09-22 DIAGNOSIS — I6350 Cerebral infarction due to unspecified occlusion or stenosis of unspecified cerebral artery: Secondary | ICD-10-CM

## 2013-09-22 NOTE — Patient Instructions (Signed)
Your physician recommends that you continue on your current medications as directed. Please refer to the Current Medication list given to you today.  Monitor your sodium intake.  Call the office if you are experiencing chest pain, shortness of breath, swelling. [phone]  Your physician wants you to follow-up in: 6 -12 months You will receive a reminder letter in the mail two months in advance. If you don't receive a letter, please call our office to schedule the follow-up appointment.

## 2013-09-22 NOTE — Progress Notes (Signed)
Patient ID: Monique [name], female   DOB: [birth year], 68 y.o.   MRN: [id number]    [address] [place], [place]  [address] Phone: [phone] Fax:  [phone]  Date:  09/22/2013   ID:  Monique [name], Monique [name] [birth year], MRN [id number]  PCP:  Phineas Inches, MD   ASSESSMENT:  1. 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 Chronic anticoagulation  PLAN:  1. No change in the current medical regimen 2. Clinical followup in 6-9 months   SUBJECTIVE: Monique [name] is a 68 y.o. female who denies dyspnea. No orthopnea. No overt episodes of CHF. She denies chest pain. She has not had palpitations or syncope.   Wt Readings from Last 3 Encounters:  09/22/13 153 lb (69.4 kg)  09/12/13 151 lb 6.4 oz (68.675 kg)  08/27/13 151 lb 14.4 oz (68.901 kg)     Past Medical History  Diagnosis Date  . Acute venous embolism and thrombosis of unspecified deep vessels of lower extremity   . Bronchitis     several times  . Abdominal or pelvic swelling, mass or lump, unspecified site   . Phlebitis and thrombophlebitis of unspecified site   . Pelvic mass   . GERD (gastroesophageal reflux disease)   . PONV (postoperative nausea and vomiting)     "when fentanyl is used, will break out in itchy rast"  . Basal cell carcinoma of face   . Ovarian cancer   . CHF (congestive heart failure)     "just since 11/2012; they think it's from the chemo" (01/21/2013)  . 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 DVT (deep venous thrombosis) 04/2012    "BLE" (01/21/2013)  . Chronic bronchitis     "I had it several times; I used to smoke" (01/21/2013)  . Shortness of breath     "trouble taking a deep breath at times before RX started; I'm fine now" (01/21/2013)  . Anemia     "because of the chemo" (01/21/2013)  . Arthritis     "little in my right hand" (01/21/2013)  . Anxiety     "related to ovarian cancer; don't take RX  for it" (01/21/2013)    Current Outpatient Prescriptions  Medication Sig Dispense Refill  . aspirin 81 MG tablet Take 81 mg by mouth daily.       . camphor-menthol (SARNA) lotion Apply 1 application topically as needed for itching.      Marland Kitchen dexamethasone (DECADRON) 4 MG tablet Take 1 tablet twice a day with food for 3 days.   Begin day prior to Alimta.  12 tablet  1  . Folic Acid 0.8 MG CAPS Take 1 capsule (0.8 mg total) by mouth daily.  30 each  3  . hydrocortisone cream 1 % Apply 1 application topically 2 (two) times daily. To rash as directed.  30 g  1  . lisinopril (PRINIVIL,ZESTRIL) 2.5 MG tablet Take 1 tablet (2.5 mg total) by mouth daily.  30 tablet  1  . methylPREDNISolone (MEDROL) 4 MG tablet Take 2 tabs when prescription picked up and 1 tab  that evening.  Then take 1 tab with breakfast/ 1 with lunch x 3 days then 1 tab with breakfast x 2 days then stop. Each dose with food.  11 tablet  2  . metoprolol succinate (TOPROL-XL) 25 MG 24 hr tablet Take 0.5 tablets (12.5 mg total) by mouth 3 (three) times  daily.  50 tablet  6  . nystatin (MYCOSTATIN) 100000 UNIT/ML suspension Take 5 mLs (500,000 Units total) by mouth 3 (three) times daily.  240 mL  1  . ondansetron (ZOFRAN) 8 MG tablet Take 8 mg by mouth every 12 (twelve) hours as needed for nausea or vomiting.      . pantoprazole (PROTONIX) 40 MG tablet Take 1 tablet (40 mg total) by mouth daily.  30 tablet  3  . polyethylene glycol (MIRALAX / GLYCOLAX) packet Take 17 g by mouth daily as needed (for constipation).       . sennosides-docusate sodium (SENOKOT-S) 8.6-50 MG tablet Take 1-2 tablets by mouth daily as needed for constipation.      Marland Kitchen spironolactone (ALDACTONE) 25 MG tablet Take 1 tablet (25 mg total) by mouth daily.  30 tablet  5  . warfarin (COUMADIN) 5 MG tablet Take 1 tablet (5 mg total) by mouth daily. Except 2.5 mg on monday  30 tablet  3   No current facility-administered medications for this visit.    Allergies:      Allergies  Allergen Reactions  . Septra [Sulfamethoxazole-Tmp Ds] Rash and Other (See Comments)    High fever, Burning skin  . Ampicillin Itching  . Fentanyl And Related Hives    Per Dr. Marcell Barlow, pt reports she is ok with fentanyl (09/10/12). Patient states it was epidural form of Fentanyl that she reacted to.   . Neosporin [Neomycin-Bacitracin Zn-Polymyx] Swelling and Other (See Comments)    opthalmic solution only can use topically. Swelling and inflamed eyes    Social History:  The patient  reports that she quit smoking about 17 months ago. Her smoking use included Cigarettes. She smoked 0.00 packs per day for 30 years. She has never used smokeless tobacco. She reports that she drinks alcohol. She reports that she does not use illicit drugs.   ROS:  Please see the history of present illness.   No edema. No orthopnea.   All other systems reviewed and negative.   OBJECTIVE: VS:  Ht 5\' 7"  (1.702 m)  Wt 153 lb (69.4 kg)  BMI 23.96 kg/m2 Well nourished, well developed, in no acute distress, younger than stated age 30: normal Neck: JVD flat. Carotid bruit absent  Cardiac:  normal S1, S2; RRR; no murmur Lungs:  clear to auscultation bilaterally, no wheezing, rhonchi or rales Abd: soft, nontender, no hepatomegaly Ext: Edema absent. Pulses trace to 2+ Skin: warm and dry Neuro:  CNs 2-12 intact, no focal abnormalities noted  EKG:  Not repeated       Signed, Illene Labrador III, MD 09/22/2013 11:27 AM

## 2013-09-28 ENCOUNTER — Other Ambulatory Visit: Payer: Self-pay | Admitting: Oncology

## 2013-10-06 ENCOUNTER — Other Ambulatory Visit (HOSPITAL_BASED_OUTPATIENT_CLINIC_OR_DEPARTMENT_OTHER): Payer: Medicare Other

## 2013-10-06 ENCOUNTER — Ambulatory Visit (HOSPITAL_BASED_OUTPATIENT_CLINIC_OR_DEPARTMENT_OTHER): Payer: Medicare Other | Admitting: Oncology

## 2013-10-06 ENCOUNTER — Encounter: Payer: Self-pay | Admitting: Oncology

## 2013-10-06 ENCOUNTER — Encounter: Payer: Self-pay | Admitting: Pharmacist

## 2013-10-06 ENCOUNTER — Telehealth: Payer: Self-pay | Admitting: Oncology

## 2013-10-06 ENCOUNTER — Telehealth: Payer: Self-pay | Admitting: *Deleted

## 2013-10-06 ENCOUNTER — Ambulatory Visit: Payer: Medicare Other

## 2013-10-06 VITALS — BP 116/75 | HR 76 | Temp 98.4°F | Resp 18 | Ht 67.0 in | Wt 149.9 lb

## 2013-10-06 DIAGNOSIS — D6859 Other primary thrombophilia: Secondary | ICD-10-CM

## 2013-10-06 DIAGNOSIS — Z86718 Personal history of other venous thrombosis and embolism: Secondary | ICD-10-CM

## 2013-10-06 DIAGNOSIS — C569 Malignant neoplasm of unspecified ovary: Secondary | ICD-10-CM

## 2013-10-06 DIAGNOSIS — C787 Secondary malignant neoplasm of liver and intrahepatic bile duct: Secondary | ICD-10-CM

## 2013-10-06 DIAGNOSIS — J449 Chronic obstructive pulmonary disease, unspecified: Secondary | ICD-10-CM

## 2013-10-06 DIAGNOSIS — J4489 Other specified chronic obstructive pulmonary disease: Secondary | ICD-10-CM

## 2013-10-06 LAB — COMPREHENSIVE METABOLIC PANEL (CC13)
ALBUMIN: 3 g/dL — AB (ref 3.5–5.0)
ALK PHOS: 197 U/L — AB (ref 40–150)
ALT: 15 U/L (ref 0–55)
AST: 30 U/L (ref 5–34)
Anion Gap: 8 mEq/L (ref 3–11)
BILIRUBIN TOTAL: 0.27 mg/dL (ref 0.20–1.20)
BUN: 23.5 mg/dL (ref 7.0–26.0)
CO2: 25 mEq/L (ref 22–29)
Calcium: 9.7 mg/dL (ref 8.4–10.4)
Chloride: 104 mEq/L (ref 98–109)
Creatinine: 1.1 mg/dL (ref 0.6–1.1)
Glucose: 86 mg/dl (ref 70–140)
POTASSIUM: 4.8 meq/L (ref 3.5–5.1)
SODIUM: 136 meq/L (ref 136–145)
TOTAL PROTEIN: 7.3 g/dL (ref 6.4–8.3)

## 2013-10-06 LAB — CBC WITH DIFFERENTIAL/PLATELET
BASO%: 0.4 % (ref 0.0–2.0)
Basophils Absolute: 0 10*3/uL (ref 0.0–0.1)
EOS%: 2.1 % (ref 0.0–7.0)
Eosinophils Absolute: 0.2 10*3/uL (ref 0.0–0.5)
HCT: 32.9 % — ABNORMAL LOW (ref 34.8–46.6)
HGB: 10.5 g/dL — ABNORMAL LOW (ref 11.6–15.9)
LYMPH%: 14.3 % (ref 14.0–49.7)
MCH: 29.6 pg (ref 25.1–34.0)
MCHC: 31.9 g/dL (ref 31.5–36.0)
MCV: 92.7 fL (ref 79.5–101.0)
MONO#: 1.1 10*3/uL — ABNORMAL HIGH (ref 0.1–0.9)
MONO%: 14.7 % — AB (ref 0.0–14.0)
NEUT#: 4.9 10*3/uL (ref 1.5–6.5)
NEUT%: 68.5 % (ref 38.4–76.8)
PLATELETS: 306 10*3/uL (ref 145–400)
RBC: 3.55 10*6/uL — AB (ref 3.70–5.45)
RDW: 14.7 % — AB (ref 11.2–14.5)
WBC: 7.2 10*3/uL (ref 3.9–10.3)
lymph#: 1 10*3/uL (ref 0.9–3.3)

## 2013-10-06 LAB — PROTIME-INR
INR: 4.6 — ABNORMAL HIGH (ref 2.00–3.50)
Protime: 55.2 Seconds — ABNORMAL HIGH (ref 10.6–13.4)

## 2013-10-06 LAB — CA 125: CA 125: 1214 U/mL — ABNORMAL HIGH (ref 0.0–30.2)

## 2013-10-06 NOTE — Progress Notes (Signed)
INR=4.6.  Dr. Marko Plume saw Monique [name] today in office.  She instructed Monique [name] to hold coumadin x 1 day then resume usual dose and will check PT/INR with chemo on 6/19.  Will f/u at that time.

## 2013-10-06 NOTE — Telephone Encounter (Signed)
Per staff message and POF I have scheduled appts. Advised scheduler of appts. JMW  

## 2013-10-06 NOTE — Patient Instructions (Signed)
Hold coumadin today then resume regular dose on Tues 6-16. We will recheck with chemo on 6-19

## 2013-10-06 NOTE — Progress Notes (Signed)
OFFICE PROGRESS NOTE   10/06/2013   Physicians:P.Clayton Bibles, MD ([facility]P [facility])/ [place], Kara Pacer   INTERVAL HISTORY:  Patient is seen, alone for visit, in continuing attention to metastatic ovarian carcinoma involving liver, presently on Alimta, due cycle 7 on 10-10-13 if this is continued. She had cycle 1 Alimta on 05-22-13 and had marked drop in CA125 from >3000 at initiation of Alimta to low of 205 in late March. Most recent CA125 value just prior to cycle 6 was 447. Patient is tolerating Alimta very well now, with no significant rash since that problem early in course. She has more fatigue for a week after treatments, but is feeling back to baseline now, doing regular home activities.She denies abdominal or pelvic discomfort other than same RUQ slight discomfort intermittently. She has had no bleeding and no recent clotting problems, continuing coumadin. Neurologic problems stable.  She does not have PAC.   ONCOLOGIC HISTORY   Ovarian cancer   06/12/2012 Initial Diagnosis Ovarian cancer   06/21/2012 - 08/16/2012 Chemotherapy dose dense paclitaxel and carboplatin   09/10/2012 Surgery suboptimal debulking    Chemotherapy liposomal dox planned but EF 15%   12/02/2012 - 04/28/2013 Chemotherapy gemcitabine x 10 cycles   05/22/2013 -  Chemotherapy Alimta   Patient developed abdominal symptoms in May 2013, saw GI Aug 2013, but was unable to tolerate prep for colonoscopy. She developed LLE superficial phlebitis and swelling in Jan 2014 , with acute thrombus left common femoral vein. 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 She was begun on lovenox and transitioned to Xarelto  by PCP. CA 125 was 569. She saw Dr Alycia Rossetti on 06-12-12, with 15 cm fixed pelvic mass. US paracentesis by IR 06-12-12 for 2.8 liters had cytology ( NZB14 - 126) positive for adenocarcinoma. Neoadjuvant dose dense carbo/taxol was given x 3 cycles 06-21-12 thru 08-16-12, with good partial response by CT 08-19-12 and CA 125 down from 1041 in Feb 2014 to 92 on 08-21-12. IVC filter placed prior to exploratory laparotomy with extensive lysis of adhesions, left ureteral lysis, and bilateral salpingo-oophorectomy by Dr Alycia Rossetti on 09-10-12, suboptimal debulking. Operative findings:diffuse carcinomatosis of the residual omentum involving the entire transverse colon. Transverse colon densely adherent to the anterior abdominal wall. No disease throughout the small bowel. Mesenteric adenopathy with lymph nodes measuring 1 cm, involving the root of the small bowel mesentery. ~5 centimeter right ovarian mass and ~ 6 cm left ovarian mass. Small bowel densely adherent to the pelvis. 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 Chemotherapy resumed with dose dense taxol carboplatin on 09-27-12, completing cycle 5 on 11-08-12. CA 125 increased from 92 in April to 130 post op in June, then 225 late July and repeat 318. CT CAP done 11-19-12 showed increase in nodular thickening along falciform ligament and small peritoneal nodule right iliac fossa. Plan was to change in chemotherapy to doxil with avastin, however echocardiogram 11-27-12 unexpectedly  had EF of 15%. 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 She was stable to resume gemzar with cycle 2 at 800 mg/m2 given on 12-30-12,then platelets down to 70k by day 8. She had progressive superficial thrombophlebitis LE and was changed to full dose lovenox then; there was difficulty getting lovenox covered by insurance. 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 Gemzar was subsequently tolerated well at 500 mg/m2, without significant thrombocytopenia, total 10 cycles given thru 04-28-13. She progressed in liver and nodes by CT 05-13-13. She wanted to continue treatment, with Alimta chosen due to comorbidities, begun 05-22-13. Platelets were 43k by day 9 cycle 1; neulasta was given day 8. Cycle 2 Alimta was dose reduced by 25% due to platelet nadir. CA 125 improved from >3000 at start of Alimta to 205 in late March prior to cycle 4.   Review of systems as above, also: No fever or symptoms of infection. No increased SOB. Occasional tachycardia, to be managed with prn increase in Bblocker per Dr H.Smith. She continues to notice proximal muscle weakness. Remainder of 10 point Review of Systems negative.  Objective:  Vital signs in last 24 hours:  BP 116/75  Pulse 76  Temp(Src) 98.4 F (36.9 C) (Oral)  Resp 18  Ht 5\' 7"  (1.702 m)  Wt 149 lb 14.4 oz (67.994 kg)  BMI 23.47 kg/m2 weight is down 3 lbs.  Alert, oriented and appropriate. Ambulatory without difficulty.  Alopecia  HEENT:PERRL, sclerae not icteric. Oral mucosa moist without lesions, posterior pharynx clear.  Neck supple. No JVD.  Lymphatics:no cervical,suraclavicular, axillary or inguinal adenopathy Resp: clear to auscultation bilaterally and normal percussion bilaterally Cardio: regular rate and rhythm. No gallop. GI: soft, nontender, not  distended, no mass or organomegaly. Normally active bowel sounds. Surgical incision not remarkable. Musculoskeletal/ Extremities: without pitting edema, cords, tenderness. Extensive superficial varicosities on LE with some discoloration as previously. Neuro: Speech mostly fluent. No focal motor or cerebellar deficits ( tho she is known to have multiple widely scattered areas of infarction bilateral cerebral and cerebellar hemispheres) Skin without rash, ecchymosis, petechiae Breasts: without dominant mass, skin or nipple findings. Axillae benign. Portacath-without erythema or tenderness  Lab Results:  Results for orders placed in visit on 10/06/13  CBC WITH DIFFERENTIAL      Result Value Ref Range   WBC 7.2  3.9 - 10.3 10e3/uL   NEUT# 4.9  1.5 - 6.5 10e3/uL   HGB 10.5 (*) 11.6 - 15.9 g/dL   HCT 32.9 (*) 34.8 - 46.6 %   Platelets 306  145 - 400 10e3/uL   MCV 92.7  79.5 - 101.0 fL   MCH 29.6  25.1 - 34.0 pg   MCHC 31.9  31.5 - 36.0 g/dL   RBC 3.55 (*) 3.70 - 5.45 10e6/uL   RDW 14.7 (*) 11.2 - 14.5 %   lymph# 1.0  0.9 - 3.3 10e3/uL   MONO# 1.1 (*) 0.1 - 0.9 10e3/uL   Eosinophils Absolute 0.2  0.0 - 0.5 10e3/uL   Basophils Absolute 0.0  0.0 - 0.1 10e3/uL   NEUT% 68.5  38.4 - 76.8 %   LYMPH% 14.3  14.0 -  49.7 %   MONO% 14.7 (*) 0.0 - 14.0 %   EOS% 2.1  0.0 - 7.0 %   BASO% 0.4  0.0 - 2.0 %  PROTIME-INR      Result Value Ref Range   Protime 55.2 (*) 10.6 - 13.4 Seconds   INR 4.60 (*) 2.00 - 3.50   Lovenox No       Studies/Results:  No results found.  Medications: I have reviewed the patient's current medications. She missed coumadin 2 days ago, then took this in AM of 6-14 + usual coumadin afternoon of 6-14. She will hold coumadin today then resume at usual dosing. We have discussed proximal muscle weakness as cumulative effect of steroids.  DISCUSSION Patient aware that I will contact her with result of CA125 when available. We discussed repeating scans if clearly increasing,  otherwise continuing Alimta and continuing to follow marker.   Assessment/Plan:  1. 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 Marked improvement in CA125 and in liver mets with Alimta since 05-22-13 initially, but CA 125 available after visit again very elevated consistent with progression of disease. Will discuss with patient by phone, DC alimta, may try oral etoposide if she wants to continue treatment. 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 IVC filter in. 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 Note superficial clots much better after responding to Alimta. Also on ASA. INR high today, fortunately no bleeding, plan as above. 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 She is not active but otherwise not usually symptomatic. No changes other than prn increase in Bblocker at recent follow up with Dr Tamala Julian. This problem also limits choices of chemotherapy drugs. 4.DNR patient's request but still treating in attempt to control disease. HCPOA is friend Clair Gulling.  5.long past tobacco with COPD  6.discoloration index fingers: ? Raynaud's vs embolic vs trauma (including dental floss)    Patient understood discussion and plan as above at time of visit.   Marin Wisner P, MD   10/06/2013, 9:52 AM  ADDENDUM 10-07-13 1055: no answer on mobile, LM on home phone to call back to MD cell, which patient did 1100. Discussed further elevation in marker, which obviously was expected at some point due to cancer developing resistance to whichever drug is used. She does want to continue treatment if there is a reasonable intervention, understands this will not cure. Discussed changing to oral etoposide. RN to do teaching by phone and we will look into coverage possibly thru [facility]. SHe understands that etoposide does not have Will cancel alimta  6-19, no oral steroids this week. Will need to reschedule coumadin follow up.  Godfrey Pick, MD

## 2013-10-06 NOTE — Telephone Encounter (Signed)
per pof to sch pt appt-MW sch trmt-gave pt copy of sch °

## 2013-10-07 ENCOUNTER — Telehealth: Payer: Self-pay

## 2013-10-07 ENCOUNTER — Other Ambulatory Visit: Payer: Self-pay

## 2013-10-07 DIAGNOSIS — C569 Malignant neoplasm of unspecified ovary: Secondary | ICD-10-CM

## 2013-10-07 MED ORDER — ETOPOSIDE 50 MG PO CAPS: 50.0000 mg | ORAL_CAPSULE | Freq: Every day | ORAL | Status: DC

## 2013-10-07 NOTE — Telephone Encounter (Signed)
Reviewed side effects of Oral Etoposide with Ms. Schreier.  She verbalized understanding. [facility] for Medication prescriptionbut insurance did not let it go through. Will fax prescription and patient demographics to [facility] tomorrow after it is signed by Dr. Marko Plume.  Reviewed prescription filling process with Ms,. Manus.

## 2013-10-07 NOTE — Telephone Encounter (Signed)
Message copied by Baruch Merl on Tue Oct 07, 2013  2:51 PM ------      Message from: Gordy Levan      Created: Tue Oct 07, 2013 11:24 AM       I just spoke to patient re marker much higher; POF done for CT AP and coumadin clinic next week. She is to see me early July.            Could RN please do teaching for oral etoposide by phone (home or mobile correct contact). Will use 50 mg qhs x 21 days every 28 days.            Can she get this thru [facility]? Or is some other way best?      Will start when available.            Thanks      Cc Joline Salt ------

## 2013-10-10 ENCOUNTER — Ambulatory Visit: Payer: Medicare Other

## 2013-10-10 ENCOUNTER — Other Ambulatory Visit: Payer: Self-pay | Admitting: *Deleted

## 2013-10-10 ENCOUNTER — Other Ambulatory Visit: Payer: Medicare Other

## 2013-10-10 ENCOUNTER — Encounter (HOSPITAL_COMMUNITY): Payer: Self-pay

## 2013-10-10 ENCOUNTER — Ambulatory Visit (HOSPITAL_COMMUNITY)
Admission: RE | Admit: 2013-10-10 | Discharge: 2013-10-10 | Disposition: A | Discharge status: Home / Self Care | Payer: Medicare Other | Source: Ambulatory Visit | Attending: Oncology | Admitting: Oncology

## 2013-10-10 DIAGNOSIS — C569 Malignant neoplasm of unspecified ovary: Secondary | ICD-10-CM | POA: Insufficient documentation

## 2013-10-10 DIAGNOSIS — C787 Secondary malignant neoplasm of liver and intrahepatic bile duct: Secondary | ICD-10-CM | POA: Insufficient documentation

## 2013-10-10 DIAGNOSIS — K8020 Calculus of gallbladder without cholecystitis without obstruction: Secondary | ICD-10-CM | POA: Insufficient documentation

## 2013-10-10 DIAGNOSIS — K4090 Unilateral inguinal hernia, without obstruction or gangrene, not specified as recurrent: Secondary | ICD-10-CM | POA: Insufficient documentation

## 2013-10-10 DIAGNOSIS — R1900 Intra-abdominal and pelvic swelling, mass and lump, unspecified site: Secondary | ICD-10-CM | POA: Insufficient documentation

## 2013-10-10 DIAGNOSIS — Z9221 Personal history of antineoplastic chemotherapy: Secondary | ICD-10-CM | POA: Insufficient documentation

## 2013-10-10 DIAGNOSIS — R911 Solitary pulmonary nodule: Secondary | ICD-10-CM | POA: Insufficient documentation

## 2013-10-10 DIAGNOSIS — R188 Other ascites: Secondary | ICD-10-CM | POA: Insufficient documentation

## 2013-10-10 DIAGNOSIS — R971 Elevated cancer antigen 125 [CA 125]: Secondary | ICD-10-CM | POA: Insufficient documentation

## 2013-10-10 DIAGNOSIS — D739 Disease of spleen, unspecified: Secondary | ICD-10-CM | POA: Insufficient documentation

## 2013-10-10 DIAGNOSIS — J9819 Other pulmonary collapse: Secondary | ICD-10-CM | POA: Insufficient documentation

## 2013-10-10 MED ORDER — IOHEXOL 300 MG/ML  SOLN
100.0000 mL | Freq: Once | INTRAMUSCULAR | Status: AC | PRN
  Administered 2013-10-10: 100 mL via INTRAVENOUS

## 2013-10-13 ENCOUNTER — Other Ambulatory Visit: Payer: Medicare Other

## 2013-10-13 ENCOUNTER — Ambulatory Visit: Payer: Medicare Other

## 2013-10-13 ENCOUNTER — Telehealth: Payer: Self-pay | Admitting: Pharmacist

## 2013-10-13 NOTE — Telephone Encounter (Signed)
[facility] will be in touch with Monique [name] today for delivery of Etoposide.  Told them to use cell phone as home number out of service.  Gave friend Jim's phone number if unable to reach patient on cell phone.

## 2013-10-14 ENCOUNTER — Ambulatory Visit (HOSPITAL_BASED_OUTPATIENT_CLINIC_OR_DEPARTMENT_OTHER): Payer: Medicare Other | Admitting: Pharmacist

## 2013-10-14 ENCOUNTER — Other Ambulatory Visit: Payer: Self-pay

## 2013-10-14 ENCOUNTER — Other Ambulatory Visit (HOSPITAL_BASED_OUTPATIENT_CLINIC_OR_DEPARTMENT_OTHER): Payer: Medicare Other

## 2013-10-14 ENCOUNTER — Encounter: Payer: Self-pay | Admitting: *Deleted

## 2013-10-14 DIAGNOSIS — Z86718 Personal history of other venous thrombosis and embolism: Secondary | ICD-10-CM

## 2013-10-14 DIAGNOSIS — I639 Cerebral infarction, unspecified: Secondary | ICD-10-CM

## 2013-10-14 LAB — PROTIME-INR
INR: 4.3 — ABNORMAL HIGH (ref 2.00–3.50)
PROTIME: 51.6 s — AB (ref 10.6–13.4)

## 2013-10-14 LAB — POCT INR: INR: 4.3

## 2013-10-14 MED ORDER — LISINOPRIL 2.5 MG PO TABS: 2.5000 mg | ORAL_TABLET | Freq: Every day | ORAL | Status: DC

## 2013-10-14 NOTE — Progress Notes (Signed)
Fax from [facility] that Etoposide was shipped for delivery on 10/14/13.

## 2013-10-14 NOTE — Progress Notes (Signed)
INR = 4.3   Goal 2-3 INR above goal range today despite decreased dose. No bleeding noted. She has a slight bruise on her forehead where she bumped her head on a piece of furniture at home. She has had no dietary changes. Her oral etoposide prescription has arrived and she plans to begin taking it this evening, she is aware this may affect her INR. Her current Coumadin dose is 5 mg daily, except 2.5 mg on Monday. We will decrease Coumadin to 5 mg daily except 2.5 mg on Mondays, Wednesday, and Fridays. Her next MD appt on is on 7/6, since her INR is elevated and she is starting etoposide we will see her in the Coumadin clinic in 1 week. She will return on 10/21/13; lab at 11am and Coumadin clinic at 11:15am.   If she experiences any bleeding or increased bleeding she will call the clinic.  Theone Murdoch, PharmD

## 2013-10-20 ENCOUNTER — Encounter: Payer: Self-pay | Admitting: *Deleted

## 2013-10-20 ENCOUNTER — Ambulatory Visit: Payer: Medicare Other

## 2013-10-21 ENCOUNTER — Ambulatory Visit (HOSPITAL_BASED_OUTPATIENT_CLINIC_OR_DEPARTMENT_OTHER): Payer: Medicare Other | Admitting: Pharmacist

## 2013-10-21 ENCOUNTER — Other Ambulatory Visit (HOSPITAL_BASED_OUTPATIENT_CLINIC_OR_DEPARTMENT_OTHER): Payer: Medicare Other

## 2013-10-21 DIAGNOSIS — C787 Secondary malignant neoplasm of liver and intrahepatic bile duct: Secondary | ICD-10-CM

## 2013-10-21 DIAGNOSIS — I639 Cerebral infarction, unspecified: Secondary | ICD-10-CM

## 2013-10-21 DIAGNOSIS — C569 Malignant neoplasm of unspecified ovary: Secondary | ICD-10-CM

## 2013-10-21 DIAGNOSIS — Z86718 Personal history of other venous thrombosis and embolism: Secondary | ICD-10-CM

## 2013-10-21 DIAGNOSIS — I6350 Cerebral infarction due to unspecified occlusion or stenosis of unspecified cerebral artery: Secondary | ICD-10-CM

## 2013-10-21 DIAGNOSIS — D6859 Other primary thrombophilia: Secondary | ICD-10-CM

## 2013-10-21 LAB — PROTIME-INR
INR: 2 (ref 2.00–3.50)
PROTIME: 24 s — AB (ref 10.6–13.4)

## 2013-10-21 LAB — CBC WITH DIFFERENTIAL/PLATELET
BASO%: 0.5 % (ref 0.0–2.0)
BASOS ABS: 0 10*3/uL (ref 0.0–0.1)
EOS%: 1.1 % (ref 0.0–7.0)
Eosinophils Absolute: 0.1 10*3/uL (ref 0.0–0.5)
HCT: 32.7 % — ABNORMAL LOW (ref 34.8–46.6)
HEMOGLOBIN: 10.7 g/dL — AB (ref 11.6–15.9)
LYMPH#: 1 10*3/uL (ref 0.9–3.3)
LYMPH%: 14.5 % (ref 14.0–49.7)
MCH: 29.7 pg (ref 25.1–34.0)
MCHC: 32.7 g/dL (ref 31.5–36.0)
MCV: 90.8 fL (ref 79.5–101.0)
MONO#: 0.4 10*3/uL (ref 0.1–0.9)
MONO%: 6.1 % (ref 0.0–14.0)
NEUT%: 77.8 % — ABNORMAL HIGH (ref 38.4–76.8)
NEUTROS ABS: 5.1 10*3/uL (ref 1.5–6.5)
NRBC: 0 % (ref 0–0)
Platelets: 222 10*3/uL (ref 145–400)
RBC: 3.6 10*6/uL — ABNORMAL LOW (ref 3.70–5.45)
RDW: 14.8 % — AB (ref 11.2–14.5)
WBC: 6.6 10*3/uL (ref 3.9–10.3)

## 2013-10-21 LAB — POCT INR: INR: 2

## 2013-10-21 NOTE — Patient Instructions (Signed)
Slightly adjust coumadin to 5 mg daily except 2.5 mg on Mondays and Fridays. Return on 10/27/13; lab at 9:30am, Dr. Marko Plume at 10am and Coumadin clinic at 10:30am.

## 2013-10-21 NOTE — Progress Notes (Signed)
INR within goal today. Hg/Hct:  10.7/32.7, Pltc = 222 Pt took coumadin as instructed. No missed or extra doses. No unusual bruising or bleeding. No s/s of clotting noted.  Shortness of breath only with exertion. No changes in diet. Pt has has 7 doses of Etoposide (Etoposide is causing her some constipation). She also took one dose of a calcium supplement but stopped it as she was concerned it may worsen her constipation. INR has decreased significantly over the last week. No coumadin doses were missed or held. Since her INR is borderline therapeutic today, will slightly increase coumadin dose. Slightly adjust coumadin to 5mg  daily except 2.5mg  on Mondays and Fridays. Return on 10/27/13; lab at 9:30am, Dr. Marko Plume at 10am and Coumadin clinic at 10:30am.

## 2013-10-26 ENCOUNTER — Other Ambulatory Visit: Payer: Self-pay | Admitting: Oncology

## 2013-10-26 NOTE — Progress Notes (Signed)
[facility] END OF TREATMENT   Name: Monique [name] Date: 10/26/2013 MRN: [id number] DOB: [birth year]   TREATMENT DATES: 05-22-13 thru 09-19-13   REFERRING PHYSICIAN: P.Gehrig  DIAGNOSIS: metastatic ovarian  STAGE AT START OF TREATMENT: IV   INTENT: control   DRUGS OR REGIMENS GIVEN: Alimta x 6 cycles   MAJOR TOXICITIES: rash with initial treatments, fatigue   REASON TREATMENT STOPPED: progression   PERFORMANCE STATUS AT END: ECOG 1   ONGOING PROBLEMS: fatigue   FOLLOW UP PLANS: change treatment to oral etoposide

## 2013-10-27 ENCOUNTER — Telehealth: Payer: Self-pay | Admitting: Oncology

## 2013-10-27 ENCOUNTER — Ambulatory Visit: Payer: Medicare Other | Admitting: Pharmacist

## 2013-10-27 ENCOUNTER — Encounter: Payer: Self-pay | Admitting: Oncology

## 2013-10-27 ENCOUNTER — Other Ambulatory Visit (HOSPITAL_BASED_OUTPATIENT_CLINIC_OR_DEPARTMENT_OTHER): Payer: Medicare Other

## 2013-10-27 ENCOUNTER — Ambulatory Visit (HOSPITAL_BASED_OUTPATIENT_CLINIC_OR_DEPARTMENT_OTHER): Payer: Medicare Other | Admitting: Oncology

## 2013-10-27 ENCOUNTER — Encounter: Payer: Self-pay | Admitting: *Deleted

## 2013-10-27 VITALS — BP 118/77 | HR 76 | Temp 98.3°F | Resp 18 | Ht 67.0 in | Wt 152.3 lb

## 2013-10-27 DIAGNOSIS — I639 Cerebral infarction, unspecified: Secondary | ICD-10-CM

## 2013-10-27 DIAGNOSIS — C779 Secondary and unspecified malignant neoplasm of lymph node, unspecified: Secondary | ICD-10-CM

## 2013-10-27 DIAGNOSIS — C787 Secondary malignant neoplasm of liver and intrahepatic bile duct: Secondary | ICD-10-CM

## 2013-10-27 DIAGNOSIS — J449 Chronic obstructive pulmonary disease, unspecified: Secondary | ICD-10-CM

## 2013-10-27 DIAGNOSIS — Z8673 Personal history of transient ischemic attack (TIA), and cerebral infarction without residual deficits: Secondary | ICD-10-CM

## 2013-10-27 DIAGNOSIS — C569 Malignant neoplasm of unspecified ovary: Secondary | ICD-10-CM

## 2013-10-27 DIAGNOSIS — D6859 Other primary thrombophilia: Secondary | ICD-10-CM

## 2013-10-27 DIAGNOSIS — I428 Other cardiomyopathies: Secondary | ICD-10-CM

## 2013-10-27 DIAGNOSIS — I808 Phlebitis and thrombophlebitis of other sites: Secondary | ICD-10-CM

## 2013-10-27 DIAGNOSIS — Z87891 Personal history of nicotine dependence: Secondary | ICD-10-CM

## 2013-10-27 DIAGNOSIS — D6959 Other secondary thrombocytopenia: Secondary | ICD-10-CM

## 2013-10-27 DIAGNOSIS — Z86718 Personal history of other venous thrombosis and embolism: Secondary | ICD-10-CM

## 2013-10-27 LAB — CBC WITH DIFFERENTIAL/PLATELET
BASO%: 0.5 % (ref 0.0–2.0)
Basophils Absolute: 0 10*3/uL (ref 0.0–0.1)
EOS%: 2.6 % (ref 0.0–7.0)
Eosinophils Absolute: 0.1 10*3/uL (ref 0.0–0.5)
HEMATOCRIT: 30.1 % — AB (ref 34.8–46.6)
HGB: 9.8 g/dL — ABNORMAL LOW (ref 11.6–15.9)
LYMPH%: 15.5 % (ref 14.0–49.7)
MCH: 30.2 pg (ref 25.1–34.0)
MCHC: 32.6 g/dL (ref 31.5–36.0)
MCV: 92.6 fL (ref 79.5–101.0)
MONO#: 0.3 10*3/uL (ref 0.1–0.9)
MONO%: 6.6 % (ref 0.0–14.0)
NEUT#: 3.2 10*3/uL (ref 1.5–6.5)
NEUT%: 74.8 % (ref 38.4–76.8)
PLATELETS: 204 10*3/uL (ref 145–400)
RBC: 3.25 10*6/uL — AB (ref 3.70–5.45)
RDW: 15 % — ABNORMAL HIGH (ref 11.2–14.5)
WBC: 4.3 10*3/uL (ref 3.9–10.3)
lymph#: 0.7 10*3/uL — ABNORMAL LOW (ref 0.9–3.3)

## 2013-10-27 LAB — COMPREHENSIVE METABOLIC PANEL (CC13)
ALK PHOS: 122 U/L (ref 40–150)
ALT: 11 U/L (ref 0–55)
AST: 27 U/L (ref 5–34)
Albumin: 3.1 g/dL — ABNORMAL LOW (ref 3.5–5.0)
Anion Gap: 5 mEq/L (ref 3–11)
BILIRUBIN TOTAL: 0.41 mg/dL (ref 0.20–1.20)
BUN: 15.5 mg/dL (ref 7.0–26.0)
CO2: 26 mEq/L (ref 22–29)
Calcium: 9.6 mg/dL (ref 8.4–10.4)
Chloride: 105 mEq/L (ref 98–109)
Creatinine: 1 mg/dL (ref 0.6–1.1)
Glucose: 80 mg/dl (ref 70–140)
Potassium: 4.5 mEq/L (ref 3.5–5.1)
SODIUM: 137 meq/L (ref 136–145)
TOTAL PROTEIN: 6.8 g/dL (ref 6.4–8.3)

## 2013-10-27 LAB — PROTIME-INR
INR: 2.1 (ref 2.00–3.50)
Protime: 25.2 Seconds — ABNORMAL HIGH (ref 10.6–13.4)

## 2013-10-27 LAB — CA 125: CA 125: 1181 U/mL — ABNORMAL HIGH (ref 0.0–30.2)

## 2013-10-27 LAB — POCT INR: INR: 2.1

## 2013-10-27 MED ORDER — ONDANSETRON HCL 8 MG PO TABS: 8.0000 mg | ORAL_TABLET | Freq: Two times a day (BID) | ORAL | Status: DC | PRN

## 2013-10-27 NOTE — Progress Notes (Signed)
OFFICE PROGRESS NOTE   10/27/2013   Physicians:P.Clayton Bibles, MD (PCP [facility])/ [place], Kara Pacer   INTERVAL HISTORY:  Patient is seen, alone for visit, now day 14 cycle 1 oral etoposide for recently progressive, metastatic ovarian carcinoma. She had initial rapid improvement with Alimta, then progressed after cycle 6 by marker and scans, tho fortunately was symptomatic mostly just with fatigue then. She began oral etoposide on 10-14-13, using this at hs with zofran. Etoposide is planned 50 mg daily x 21 days every 28 days if tolerated; counts other than hemoglobin are in good range today. She continues coumadin + ASA for recurrent superficial thrombophlebitis as well as previous DVTs and embolic CVAs. She denies bleeding or symptoms of clots now.  Energy is better, appetite good, no pain and no increased SOB or other symptoms of the anemia. She cleaned debris off roof over weekend, carrying the ladder and climbing to roof without difficulty.She has had extremely minimal nausea but is more constipated possible from the zofran, so will try without hs premedication for now.   She does not have central catheter. She has IVC filter.  ONCOLOGIC HISTORY   Ovarian cancer   06/12/2012 Initial Diagnosis Ovarian cancer   06/21/2012 - 08/16/2012 Chemotherapy dose dense paclitaxel and carboplatin   09/10/2012 Surgery suboptimal debulking    Chemotherapy liposomal dox planned but EF 15%   12/02/2012 - 04/28/2013 Chemotherapy gemcitabine x 10 cycles   05/22/2013 -  Chemotherapy Alimta   Patient developed abdominal symptoms in May 2013, saw GI Aug 2013, but was unable to tolerate prep for colonoscopy. She developed LLE superficial phlebitis and swelling in Jan 2014 , with acute thrombus left common femoral vein. 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 She was begun on lovenox and transitioned to Xarelto by PCP. CA 125 was 569. She saw Dr Alycia Rossetti on 06-12-12, with 15 cm fixed pelvic mass. US paracentesis by IR 06-12-12 for 2.8 liters had cytology ( NZB14 - 126) positive for adenocarcinoma. Neoadjuvant dose dense carbo/taxol was given x 3 cycles 06-21-12 thru 08-16-12, with good partial response by CT 08-19-12 and CA 125 down from 1041 in Feb 2014 to 92 on 08-21-12. IVC filter placed prior to exploratory laparotomy with extensive lysis of adhesions, left ureteral lysis, and bilateral salpingo-oophorectomy by Dr Alycia Rossetti on 09-10-12, suboptimal debulking. Operative findings:diffuse carcinomatosis of the residual omentum involving the entire transverse colon. Transverse colon densely adherent to the anterior abdominal wall. No disease throughout the small bowel. Mesenteric adenopathy with lymph nodes measuring 1 cm, involving the root of the small bowel mesentery. ~5 centimeter right ovarian mass and ~ 6 cm left ovarian mass. Small bowel densely adherent to the pelvis. 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 Chemotherapy resumed with dose dense taxol carboplatin on 09-27-12, completing cycle 5 on 11-08-12. CA 125 increased from 92 in April to 130 post op in June, then 225 late July  and repeat 318. CT CAP done 11-19-12 showed increase in nodular thickening along falciform ligament and small peritoneal nodule right iliac fossa. Plan was to change in chemotherapy to doxil with avastin,  however echocardiogram 11-27-12 unexpectedly had EF of 15%. She received one cycle of gemzar on 12-02-12, given peripherally, then had embolic CVAs on 3-41-93 while still on xarelto, with bleeding into the embolic CVA , and treated with heparin transitioned to coumadin. She was stable to resume gemzar with cycle 2 at 800 mg/m2 given on 12-30-12,then platelets down to 70k by day 8. She had progressive superficial thrombophlebitis LE and was changed to full dose lovenox then; there was difficulty getting lovenox covered by insurance. Cycle 3 gemzar 01-13-13 was dose reduced to 600 mg/m2, however platelets still dropped to 90k; she had recurrent embolic CVAs on 7-90-24 and was hospitalized thru 01-28-13, treated with continuous heparin transitioned back to coumadin, plus baby ASA daily. Gemzar was subsequently tolerated well at 500 mg/m2, without significant thrombocytopenia, total 10 cycles given thru 04-28-13. She progressed in liver and nodes by CT 05-13-13. She wanted to continue treatment, with Alimta chosen due to comorbidities, begun 05-22-13. Platelets were 43k by day 9 cycle 1; neulasta was given day 8. Cycle 2 Alimta was dose reduced by 25% due to platelet nadir. CA 125 improved from >3000 at start of Alimta to 205 in late March prior to cycle 4; marker began increasing with cycle 6, up to 1214 by 10-06-13. Cycle 6 Alimta was 09-19-13. Repeat CT AP 10-10-13 confirmed progression in liver, mesenteric nodules and pelvis, with new small area in spleen and 2 mm nodule right pleura. She began oral etoposide on 10-14-13.   Review of systems as above, also: Proximal muscle weakness already improving off steroids. Bowels moving with senokot S. No chest pain or other different cardiac symptoms. Discoloration in index fingers minimal now.  Remainder of 10 point Review of Systems negative.  Objective:  Vital signs in last 24 hours:  BP 118/77  Pulse 76  Temp(Src) 98.3 F (36.8 C) (Oral)  Resp 18  Ht 5\' 7"  (1.702 m)   Wt 152 lb 4.8 oz (69.083 kg)  BMI 23.85 kg/m2 weight is up 2 lbs.  Alert, oriented and appropriate. Ambulatory without difficulty. Speech fluent. Alopecia  HEENT:PERRL, sclerae not icteric. Oral mucosa moist without lesions, posterior pharynx clear.  Neck supple. No JVD.  Lymphatics:no cervical,suraclavicular, axillary or inguinal adenopathy Resp: somewhat diminished breath sounds bilaterally otherwise clear to auscultation bilaterally and normal percussion bilaterally Cardio: regular rate and rhythm. No gallop. GI: soft, nontender, not distended, no mass or organomegaly. Some bowel sounds. Surgical incision not remarkable. Musculoskeletal/ Extremities: without pitting edema, cords, tenderness. Extensive superficial varicosities LE bilaterally, without areas of concern now. Index fingers no discoloration. Neuro: no peripheral neuropathy. Moves extremities equally. Gait steady. PSYCH: mood and affect appropriate Skin without rash, ecchymosis, petechiae   Lab Results:  Results for orders placed in visit on 10/27/13  CBC WITH DIFFERENTIAL      Result Value Ref Range   WBC 4.3  3.9 - 10.3 10e3/uL   NEUT# 3.2  1.5 - 6.5 10e3/uL   HGB 9.8 (*) 11.6 - 15.9 g/dL   HCT 30.1 (*) 34.8 - 46.6 %   Platelets 204  145 - 400 10e3/uL   MCV 92.6  79.5 - 101.0 fL   MCH 30.2  25.1 - 34.0 pg   MCHC 32.6  31.5 - 36.0 g/dL   RBC 3.25 (*) 3.70 - 5.45 10e6/uL  RDW 15.0 (*) 11.2 - 14.5 %   lymph# 0.7 (*) 0.9 - 3.3 10e3/uL   MONO# 0.3  0.1 - 0.9 10e3/uL   Eosinophils Absolute 0.1  0.0 - 0.5 10e3/uL   Basophils Absolute 0.0  0.0 - 0.1 10e3/uL   NEUT% 74.8  38.4 - 76.8 %   LYMPH% 15.5  14.0 - 49.7 %   MONO% 6.6  0.0 - 14.0 %   EOS% 2.6  0.0 - 7.0 %   BASO% 0.5  0.0 - 2.0 %  COMPREHENSIVE METABOLIC PANEL (TK24)      Result Value Ref Range   Sodium 137  136 - 145 mEq/L   Potassium 4.5  3.5 - 5.1 mEq/L   Chloride 105  98 - 109 mEq/L   CO2 26  22 - 29 mEq/L   Glucose 80  70 - 140 mg/dl   BUN 15.5  7.0  - 26.0 mg/dL   Creatinine 1.0  0.6 - 1.1 mg/dL   Total Bilirubin 0.41  0.20 - 1.20 mg/dL   Alkaline Phosphatase 122  40 - 150 U/L   AST 27  5 - 34 U/L   ALT 11  0 - 55 U/L   Total Protein 6.8  6.4 - 8.3 g/dL   Albumin 3.1 (*) 3.5 - 5.0 g/dL   Calcium 9.6  8.4 - 10.4 mg/dL   Anion Gap 5  3 - 11 mEq/L  PROTIME-INR      Result Value Ref Range   Protime 25.2 (*) 10.6 - 13.4 Seconds   INR 2.10  2.00 - 3.50   Lovenox No      CA 125 available after visit 1181, this having been 0973 on 10-06-13 (and 447 on 09-12-13).  Studies/Results:  No results found.  Medications: I have reviewed the patient's current medications. OK to keep coumadin low therapeutic for now as counts may decrease further in next week on etoposide and as no symptoms of thrombophlebitis recently.   DISCUSSION: patient knows to call if problems or concerns about low counts prior to next visit. She is concerned that cardiac condition limits chemo choices, which it does, but fortunately she does still have noncardiotoxic options including this etoposide.  Assessment/Plan:  1. 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 Now day 14 cycle 1 oral etoposide, tolerating well, counts ok, so will continue full 21 days unless complications. Marker may be showing some early response.   I will see her back with labs prior to cycle 2. Change zofran to prn. 2.embolic CVAs fall 5329 and recurrent episodes of superficial thrombophlebitis + previous DVT. IVC filter in. 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 Note superficial clots were much better after cancer improved with Alimta. Also on ASA. INR ok in low therapeutic range today. 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 She is not active but otherwise not usually symptomatic. No changes other than prn increase in Bblocker at recent follow up with Dr  Tamala Julian. This problem also limits choices of chemotherapy drugs.  4.DNR patient's request but still treating in attempt to control disease. HCPOA is friend Clair Gulling.  5.long past tobacco with COPD      Patient had all questions answered to her satisfaction and is in agreement with plan. Time spent 25 min including >50% counseling and coordination of care.   LIVESAY,LENNIS P, MD   10/27/2013, 10:46 AM

## 2013-10-27 NOTE — Progress Notes (Signed)
RECEIVED A FAX FROM [facility] CONCERNING A PRIOR AUTHORIZATION FOR ONDANSETRON. THIS REQUEST WAS PLACED IN THE MANAGED CARE BIN.

## 2013-10-27 NOTE — Telephone Encounter (Signed)
, °

## 2013-10-27 NOTE — Patient Instructions (Signed)
Continue coumadin to 5 mg daily except 2.5 mg on Mondays and Fridays. Return on 11/10/13; lab at 11:00am and Coumadin clinic at 11:15am.

## 2013-10-27 NOTE — Progress Notes (Signed)
Pt seen in clinic after her MD appmt with [facility] INR=2.1 on 5mg  daily with 2.5 mg on Mon and Fri No changes to report She is no longer on Alimta, she is on PO etoposide 21 on/7 off Today is day 14 of etoposide per patient Will send request to cancel infusion apmt and lab for 7/10 RTC on 11/10/13 at 11:00 lab and 11:15 CC

## 2013-10-28 ENCOUNTER — Telehealth: Payer: Self-pay

## 2013-10-28 DIAGNOSIS — C569 Malignant neoplasm of unspecified ovary: Secondary | ICD-10-CM

## 2013-10-28 MED ORDER — FERROUS FUMARATE 325 (106 FE) MG PO TABS: ORAL_TABLET | ORAL | Status: DC

## 2013-10-28 NOTE — Telephone Encounter (Signed)
Spoke with Pharmacist at [facility] and gave directions as noted below for hemocyte by Dr Marko Plume.

## 2013-10-28 NOTE — Telephone Encounter (Signed)
Message copied by Baruch Merl on Tue Oct 28, 2013 10:47 AM ------      Message from: Gordy Levan      Created: Tue Oct 28, 2013  8:22 AM       Labs seen and need follow up: please let her know CA 125 is a little lower since starting the etoposide, at 1180 ------

## 2013-10-28 NOTE — Telephone Encounter (Signed)
Told [name] the results of the ca-125 as noted below by Dr. Marko Plume.

## 2013-10-28 NOTE — Telephone Encounter (Signed)
Message copied by Baruch Merl on Tue Oct 28, 2013 10:21 AM ------      Message from: Gordy Levan      Created: Mon Oct 27, 2013 10:42 AM       Please have her pharmacy see if her ins will cover Hemocyte ~ 324 mg daily on empty stomach with OJ or with Vit C #30 4 RF.If insurance will not cover name brand, please have pharmacist substitute ferrous fumarate or ferrous gluconate (not ferrous sulfate) with same instructions.            Cc LA, TH ------

## 2013-10-29 ENCOUNTER — Encounter: Payer: Self-pay | Admitting: Oncology

## 2013-10-29 ENCOUNTER — Telehealth: Payer: Self-pay | Admitting: *Deleted

## 2013-10-29 NOTE — Telephone Encounter (Signed)
[email or web address] form received with [id number].  Checked site and ondansetron 8mg  is medication in need of prior authorization (P.A.).  This form to managed care for P.A.

## 2013-10-29 NOTE — Progress Notes (Signed)
Faxed ondansetron pa form to [employer] °

## 2013-10-30 ENCOUNTER — Encounter: Payer: Self-pay | Admitting: Oncology

## 2013-10-30 NOTE — Progress Notes (Signed)
[employer] approved ondansetron from 10/29/13-10/30/14 °

## 2013-10-31 ENCOUNTER — Ambulatory Visit: Payer: Medicare Other

## 2013-10-31 ENCOUNTER — Other Ambulatory Visit: Payer: Medicare Other

## 2013-11-02 ENCOUNTER — Other Ambulatory Visit: Payer: Self-pay | Admitting: Oncology

## 2013-11-06 ENCOUNTER — Other Ambulatory Visit: Payer: Self-pay | Admitting: Oncology

## 2013-11-07 ENCOUNTER — Ambulatory Visit (HOSPITAL_BASED_OUTPATIENT_CLINIC_OR_DEPARTMENT_OTHER): Payer: Self-pay | Admitting: Pharmacist

## 2013-11-07 ENCOUNTER — Encounter: Payer: Self-pay | Admitting: Oncology

## 2013-11-07 ENCOUNTER — Ambulatory Visit: Payer: Medicare Other

## 2013-11-07 ENCOUNTER — Ambulatory Visit (HOSPITAL_BASED_OUTPATIENT_CLINIC_OR_DEPARTMENT_OTHER): Payer: Medicare Other | Admitting: Oncology

## 2013-11-07 ENCOUNTER — Telehealth: Payer: Self-pay | Admitting: Oncology

## 2013-11-07 ENCOUNTER — Other Ambulatory Visit (HOSPITAL_BASED_OUTPATIENT_CLINIC_OR_DEPARTMENT_OTHER): Payer: Medicare Other

## 2013-11-07 VITALS — BP 112/65 | HR 84 | Temp 98.4°F | Resp 20 | Ht 67.0 in | Wt 151.9 lb

## 2013-11-07 DIAGNOSIS — C786 Secondary malignant neoplasm of retroperitoneum and peritoneum: Secondary | ICD-10-CM

## 2013-11-07 DIAGNOSIS — R911 Solitary pulmonary nodule: Secondary | ICD-10-CM

## 2013-11-07 DIAGNOSIS — I8000 Phlebitis and thrombophlebitis of superficial vessels of unspecified lower extremity: Secondary | ICD-10-CM

## 2013-11-07 DIAGNOSIS — C569 Malignant neoplasm of unspecified ovary: Secondary | ICD-10-CM

## 2013-11-07 DIAGNOSIS — Z8673 Personal history of transient ischemic attack (TIA), and cerebral infarction without residual deficits: Secondary | ICD-10-CM

## 2013-11-07 DIAGNOSIS — Z86718 Personal history of other venous thrombosis and embolism: Secondary | ICD-10-CM

## 2013-11-07 DIAGNOSIS — I639 Cerebral infarction, unspecified: Secondary | ICD-10-CM

## 2013-11-07 DIAGNOSIS — C787 Secondary malignant neoplasm of liver and intrahepatic bile duct: Secondary | ICD-10-CM

## 2013-11-07 DIAGNOSIS — I6350 Cerebral infarction due to unspecified occlusion or stenosis of unspecified cerebral artery: Secondary | ICD-10-CM

## 2013-11-07 DIAGNOSIS — C779 Secondary and unspecified malignant neoplasm of lymph node, unspecified: Secondary | ICD-10-CM

## 2013-11-07 DIAGNOSIS — D649 Anemia, unspecified: Secondary | ICD-10-CM

## 2013-11-07 LAB — COMPREHENSIVE METABOLIC PANEL (CC13)
ALT: 10 U/L (ref 0–55)
AST: 23 U/L (ref 5–34)
Albumin: 3.1 g/dL — ABNORMAL LOW (ref 3.5–5.0)
Alkaline Phosphatase: 117 U/L (ref 40–150)
Anion Gap: 7 mEq/L (ref 3–11)
BUN: 17.1 mg/dL (ref 7.0–26.0)
CALCIUM: 9.2 mg/dL (ref 8.4–10.4)
CHLORIDE: 108 meq/L (ref 98–109)
CO2: 23 mEq/L (ref 22–29)
CREATININE: 0.8 mg/dL (ref 0.6–1.1)
Glucose: 118 mg/dl (ref 70–140)
Potassium: 4.1 mEq/L (ref 3.5–5.1)
Sodium: 138 mEq/L (ref 136–145)
Total Bilirubin: 0.32 mg/dL (ref 0.20–1.20)
Total Protein: 6.7 g/dL (ref 6.4–8.3)

## 2013-11-07 LAB — CBC WITH DIFFERENTIAL/PLATELET
BASO%: 1.4 % (ref 0.0–2.0)
BASOS ABS: 0 10*3/uL (ref 0.0–0.1)
EOS%: 3.1 % (ref 0.0–7.0)
Eosinophils Absolute: 0.1 10*3/uL (ref 0.0–0.5)
HEMATOCRIT: 29.2 % — AB (ref 34.8–46.6)
HEMOGLOBIN: 9.3 g/dL — AB (ref 11.6–15.9)
LYMPH%: 17 % (ref 14.0–49.7)
MCH: 30.4 pg (ref 25.1–34.0)
MCHC: 32.1 g/dL (ref 31.5–36.0)
MCV: 94.7 fL (ref 79.5–101.0)
MONO#: 0.3 10*3/uL (ref 0.1–0.9)
MONO%: 7.8 % (ref 0.0–14.0)
NEUT#: 2.5 10*3/uL (ref 1.5–6.5)
NEUT%: 70.7 % (ref 38.4–76.8)
PLATELETS: 248 10*3/uL (ref 145–400)
RBC: 3.08 10*6/uL — ABNORMAL LOW (ref 3.70–5.45)
RDW: 16.2 % — ABNORMAL HIGH (ref 11.2–14.5)
WBC: 3.5 10*3/uL — ABNORMAL LOW (ref 3.9–10.3)
lymph#: 0.6 10*3/uL — ABNORMAL LOW (ref 0.9–3.3)

## 2013-11-07 LAB — POCT INR: INR: 2.4

## 2013-11-07 LAB — PROTIME-INR
INR: 2.4 (ref 2.00–3.50)
Protime: 28.8 Seconds — ABNORMAL HIGH (ref 10.6–13.4)

## 2013-11-07 NOTE — Progress Notes (Signed)
OFFICE PROGRESS NOTE   11/07/2013   Physicians:P.Clayton Bibles, MD ([facility]P [facility])/ [place], Kara Pacer   INTERVAL HISTORY:  Patient is seen, alone for visit, in continuing attention to advanced gyn carcinoma, having had cycle 1 oral etoposide from 10-14-13 thru 11-03-13, planned x 21 days every 28 days. 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  She had more constipation this week, improved with Senokot S. She has mild, nonpuritic rash on upper arms bilaterally in same distribution as with previous Alimta. She has had no nausea, no bleeding and no symptoms of blood clots. Fatigue with exertion is better than towards end of Alimta, but she does not tolerate hot weather well and has found service to help with yard work. No new or different neurologic problems.   ONCOLOGIC HISTORY   Ovarian cancer   06/12/2012 Initial Diagnosis Ovarian cancer   06/21/2012 - 08/16/2012 Chemotherapy dose dense paclitaxel and carboplatin   09/10/2012 Surgery suboptimal debulking    Chemotherapy liposomal dox planned but EF 15%   12/02/2012 - 04/28/2013 Chemotherapy gemcitabine x 10 cycles   05/22/2013 -  Chemotherapy Alimta   Patient developed abdominal symptoms in May 2013, saw GI Aug 2013, but was unable to tolerate prep for colonoscopy. She developed LLE superficial phlebitis and swelling in Jan 2014 , with acute thrombus left common femoral vein. 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 She was begun on lovenox and transitioned to Xarelto by PCP. CA 125 was 569. She saw Dr Alycia Rossetti on 06-12-12, with 15 cm  fixed pelvic mass. US paracentesis by IR 06-12-12 for 2.8 liters had cytology ( NZB14 - 126) positive for adenocarcinoma. Neoadjuvant dose dense carbo/taxol was given x 3 cycles 06-21-12 thru 08-16-12, with good partial response by CT 08-19-12 and CA 125 down from 1041 in Feb 2014 to 92 on 08-21-12. IVC filter placed prior to exploratory laparotomy with extensive lysis of adhesions, left ureteral lysis, and bilateral salpingo-oophorectomy by Dr Alycia Rossetti on 09-10-12, suboptimal debulking. Operative findings:diffuse carcinomatosis of the residual omentum involving the entire transverse colon. Transverse colon densely adherent to the anterior abdominal wall. No disease throughout the small bowel. Mesenteric adenopathy with lymph nodes measuring 1 cm, involving the root of the small bowel mesentery. ~5 centimeter right ovarian mass and ~ 6 cm left ovarian mass. Small bowel densely adherent to the pelvis. 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 Chemotherapy resumed with dose dense taxol carboplatin on 09-27-12, completing cycle 5 on 11-08-12. CA 125 increased from 92 in April to 130 post op in June, then 225 late July and repeat 318. CT CAP done 11-19-12 showed increase in nodular thickening along falciform ligament and small peritoneal nodule right iliac fossa. Plan was to change in chemotherapy to doxil with avastin, however echocardiogram 11-27-12 unexpectedly had EF of 15%. She received one cycle of gemzar on 12-02-12, given  peripherally, then had embolic CVAs on 9-67-89 while still on xarelto, with bleeding into the embolic CVA , and treated with heparin transitioned to coumadin. She was stable to resume  gemzar with cycle 2 at 800 mg/m2 given on 12-30-12,then platelets down to 70k by day 8. She had progressive superficial thrombophlebitis LE and was changed to full dose lovenox then; there was difficulty getting lovenox covered by insurance. Cycle 3 gemzar 01-13-13 was dose reduced to 600 mg/m2, however platelets still dropped to 90k; she had recurrent embolic CVAs on 3-81-01 and was hospitalized thru 01-28-13, treated with continuous heparin transitioned back to coumadin, plus baby ASA daily. Gemzar was subsequently tolerated well at 500 mg/m2, without significant thrombocytopenia, total 10 cycles given thru 04-28-13. She progressed in liver and nodes by CT 05-13-13. She wanted to continue treatment, with Alimta chosen due to comorbidities, begun 05-22-13. Platelets were 43k by day 9 cycle 1; neulasta was given day 8. Cycle 2 Alimta was dose reduced by 25% due to platelet nadir. CA 125 improved from >3000 at start of Alimta to 205 in late March prior to cycle 4; marker began increasing with cycle 6, up to 1214 by 10-06-13. Cycle 6 Alimta was 09-19-13. Repeat CT AP 10-10-13 confirmed progression in liver, mesenteric nodules and pelvis, with new small area in spleen and 2 mm nodule right pleura. She began oral etoposide on 10-14-13.   Review of systems as above, also: No complaints of abdominal or pelvic pain. No bladder symptoms. No increased cough. No LE swelling. Remainder of 10 point Review of Systems negative.  Objective:  Vital signs in last 24 hours:  BP 112/65  Pulse 84  Temp(Src) 98.4 F (36.9 C) (Oral)  Resp 20  Ht 5\' 7"  (1.702 m)  Wt 151 lb 14.4 oz (68.901 kg)  BMI 23.79 kg/m2 Weight is stable Alert, oriented and appropriate. Ambulatory without difficulty.  Alopecia  HEENT:PERRL, sclerae not icteric. Oral mucosa moist without lesions, posterior pharynx clear.  Neck supple. No JVD.  Lymphatics:no cervical,suraclavicular or inguinal adenopathy Resp: clear to auscultation bilaterally and normal  percussion bilaterally Cardio: regular rate and rhythm. No gallop. GI: soft, nontender, not distended, no mass or organomegaly. Normally active bowel sounds. Surgical incision not remarkable. Musculoskeletal/ Extremities: without pitting edema, cords, tenderness. Prominent superficial varicosities bilat LE, some residual discoloration in areas of previous thrombophlebitis Neuro: speech seems a little more hesitant today. No focal changes otherwise. PSYCH mood and affect appropriate Skin with slightly erythematous papular rash upper arms laterally, ~ 6 x 8 cm areas  Lab Results:  Results for orders placed in visit on 11/07/13  CBC WITH DIFFERENTIAL      Result Value Ref Range   WBC 3.5 (*) 3.9 - 10.3 10e3/uL   NEUT# 2.5  1.5 - 6.5 10e3/uL   HGB 9.3 (*) 11.6 - 15.9 g/dL   HCT 29.2 (*) 34.8 - 46.6 %   Platelets 248  145 - 400 10e3/uL   MCV 94.7  79.5 - 101.0 fL   MCH 30.4  25.1 - 34.0 pg   MCHC 32.1  31.5 - 36.0 g/dL   RBC 3.08 (*) 3.70 - 5.45 10e6/uL   RDW 16.2 (*) 11.2 - 14.5 %   lymph# 0.6 (*) 0.9 - 3.3 10e3/uL   MONO# 0.3  0.1 - 0.9 10e3/uL   Eosinophils Absolute 0.1  0.0 - 0.5 10e3/uL   Basophils Absolute 0.0  0.0 - 0.1 10e3/uL   NEUT% 70.7  38.4 - 76.8 %   LYMPH%  17.0  14.0 - 49.7 %   MONO% 7.8  0.0 - 14.0 %   EOS% 3.1  0.0 - 7.0 %   BASO% 1.4  0.0 - 2.0 %  PROTIME-INR      Result Value Ref Range   Protime 28.8 (*) 10.6 - 13.4 Seconds   INR 2.40  2.00 - 3.50   Lovenox No      Hemoglobin noted.Last B12 with Alimta was 09-19-13.  CMET available after visit normal with exception of albumin of 3.1  Studies/Results:  At patient's request, we reviewed comment in report of last CT AP from 10-10-13 re 56mm pleural nodule RLL, which I have told her does not seem concerning and we will follow with next scan. She is reassured with this discussion.  Medications: I have reviewed the patient's current medications. [facility] has delivered next supply of etoposide, which she will begin ~  11-11-13. She has just begun hemocyte as she did not want to begin until constipation resolved.  DISCUSSION: Discussed with coumadin clinic pharmacist, agree no change to present coumadin dosing. Will follow the lower hemoglobin; she is to call prior to next scheduled visit if more fatigued or other symptoms of anemia.  Assessment/Plan: 1. 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 Tolerated 21 days of etoposide well for cycle 1 oral etoposide (6-23 thru 11-04-13). She will begin cycle 2 on ~ 11-11-13; I will see her back with labs around day 14. 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 IVC filter in. 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 Note superficial clots were much better after cancer improved with Alimta. Also on ASA.  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 She is not active but otherwise not usually symptomatic. No changes other than prn increase in Bblocker at recent follow up with Dr Tamala Julian. This problem also limits choices of chemotherapy drugs.  4.DNR patient's request but still treating in attempt to control disease. HCPOA is friend Clair Gulling.  5.long past tobacco with COPD  6.progressive anemia: likely multifactorial with ongoing chemo and multiple blood draws. Has just begun oral iron. Follow. With cardiac disease I would transfuse if <=8.5 or if symptoms above that value.  Patient understands discussion and is in agreement with plan. Time spent 25 min including >50% counseling and coordination of care  Gabrella Stroh P, MD   11/07/2013, 1:46 PM

## 2013-11-07 NOTE — Progress Notes (Signed)
INR = 2.4 on Coumadin 5 mg daily except 2.5 mg on Mon/Fri. Dr. Marko Plume saw pt today and gave her instructions to keep taking same Coumadin dose. Return 11/28/13 for lab/Coumadin clinic & MD visit - Dr. Marko Plume will submit scheduling request. Kennith [place], Pharm.D., CPP 11/07/2013@1 :52 PM

## 2013-11-07 NOTE — Telephone Encounter (Signed)
[facility] PT APPT SCHEDULE FOR AUG

## 2013-11-19 ENCOUNTER — Telehealth: Payer: Self-pay

## 2013-11-19 NOTE — Telephone Encounter (Signed)
Monique [name] called back stating she received the messged noted below.  There has been no more bleeding since Saturday night 11-15-13.

## 2013-11-19 NOTE — Telephone Encounter (Signed)
Left message for Monique [name] stating that Dr. Everitt Amber said that if bleeding increases like a period that she would need to be seen by one of the gyn oncologist to evaluate and manage bleeding. [name] called 11-17-13 stating that she was having some spotting of blood in her undergarment the size fo a silver dollar and some light pink blood on toilet tissue that began on Sat 11-15-13. She wanted Dr. Marko Plume to be aware.

## 2013-11-20 ENCOUNTER — Other Ambulatory Visit: Payer: Self-pay | Admitting: *Deleted

## 2013-11-20 DIAGNOSIS — I63432 Cerebral infarction due to embolism of left posterior cerebral artery: Secondary | ICD-10-CM

## 2013-11-20 DIAGNOSIS — I8003 Phlebitis and thrombophlebitis of superficial vessels of lower extremities, bilateral: Secondary | ICD-10-CM

## 2013-11-20 MED ORDER — WARFARIN SODIUM 5 MG PO TABS: 5.0000 mg | ORAL_TABLET | Freq: Every day | ORAL | Status: DC

## 2013-11-25 ENCOUNTER — Other Ambulatory Visit: Payer: Self-pay | Admitting: Oncology

## 2013-11-26 ENCOUNTER — Other Ambulatory Visit: Payer: Self-pay | Admitting: *Deleted

## 2013-11-26 DIAGNOSIS — C569 Malignant neoplasm of unspecified ovary: Secondary | ICD-10-CM

## 2013-11-26 DIAGNOSIS — K30 Functional dyspepsia: Secondary | ICD-10-CM

## 2013-11-26 MED ORDER — PANTOPRAZOLE SODIUM 40 MG PO TBEC: 40.0000 mg | DELAYED_RELEASE_TABLET | Freq: Every day | ORAL | Status: DC

## 2013-11-28 ENCOUNTER — Ambulatory Visit: Payer: Medicare Other | Admitting: Pharmacist

## 2013-11-28 ENCOUNTER — Ambulatory Visit (HOSPITAL_BASED_OUTPATIENT_CLINIC_OR_DEPARTMENT_OTHER): Payer: Medicare Other | Admitting: Oncology

## 2013-11-28 ENCOUNTER — Telehealth: Payer: Self-pay | Admitting: Oncology

## 2013-11-28 ENCOUNTER — Other Ambulatory Visit (HOSPITAL_BASED_OUTPATIENT_CLINIC_OR_DEPARTMENT_OTHER): Payer: Medicare Other

## 2013-11-28 ENCOUNTER — Encounter: Payer: Self-pay | Admitting: Oncology

## 2013-11-28 VITALS — BP 117/65 | HR 76 | Temp 98.9°F | Resp 18 | Ht 67.0 in | Wt 152.1 lb

## 2013-11-28 DIAGNOSIS — Z86718 Personal history of other venous thrombosis and embolism: Secondary | ICD-10-CM

## 2013-11-28 DIAGNOSIS — C569 Malignant neoplasm of unspecified ovary: Secondary | ICD-10-CM

## 2013-11-28 DIAGNOSIS — D649 Anemia, unspecified: Secondary | ICD-10-CM

## 2013-11-28 DIAGNOSIS — R350 Frequency of micturition: Secondary | ICD-10-CM

## 2013-11-28 DIAGNOSIS — R21 Rash and other nonspecific skin eruption: Secondary | ICD-10-CM

## 2013-11-28 DIAGNOSIS — I639 Cerebral infarction, unspecified: Secondary | ICD-10-CM

## 2013-11-28 DIAGNOSIS — I6350 Cerebral infarction due to unspecified occlusion or stenosis of unspecified cerebral artery: Secondary | ICD-10-CM

## 2013-11-28 LAB — CBC WITH DIFFERENTIAL/PLATELET
BASO%: 0.3 % (ref 0.0–2.0)
Basophils Absolute: 0 10*3/uL (ref 0.0–0.1)
EOS%: 1.8 % (ref 0.0–7.0)
Eosinophils Absolute: 0.1 10*3/uL (ref 0.0–0.5)
HCT: 29.9 % — ABNORMAL LOW (ref 34.8–46.6)
HGB: 9.6 g/dL — ABNORMAL LOW (ref 11.6–15.9)
LYMPH#: 0.6 10*3/uL — AB (ref 0.9–3.3)
LYMPH%: 17.4 % (ref 14.0–49.7)
MCH: 30.2 pg (ref 25.1–34.0)
MCHC: 32.1 g/dL (ref 31.5–36.0)
MCV: 94 fL (ref 79.5–101.0)
MONO#: 0.2 10*3/uL (ref 0.1–0.9)
MONO%: 6.9 % (ref 0.0–14.0)
NEUT#: 2.5 10*3/uL (ref 1.5–6.5)
NEUT%: 73.6 % (ref 38.4–76.8)
Platelets: 203 10*3/uL (ref 145–400)
RBC: 3.18 10*6/uL — AB (ref 3.70–5.45)
RDW: 17.8 % — ABNORMAL HIGH (ref 11.2–14.5)
WBC: 3.3 10*3/uL — AB (ref 3.9–10.3)

## 2013-11-28 LAB — URINALYSIS, MICROSCOPIC - CHCC
Bilirubin (Urine): NEGATIVE
GLUCOSE UR CHCC: NEGATIVE mg/dL
KETONES: NEGATIVE mg/dL
Leukocyte Esterase: NEGATIVE
Nitrite: NEGATIVE
Protein: <30 mg/dL
SPECIFIC GRAVITY, URINE: 1.03 (ref 1.003–1.035)
Urobilinogen, UR: 0.2 mg/dL (ref 0.2–1)
WBC UA: NEGATIVE (ref 0–2)
pH: 6 (ref 4.6–8.0)

## 2013-11-28 LAB — PROTIME-INR
INR: 2.8 (ref 2.00–3.50)
PROTIME: 33.6 s — AB (ref 10.6–13.4)

## 2013-11-28 LAB — COMPREHENSIVE METABOLIC PANEL (CC13)
ALT: 8 U/L (ref 0–55)
AST: 22 U/L (ref 5–34)
Albumin: 3.2 g/dL — ABNORMAL LOW (ref 3.5–5.0)
Alkaline Phosphatase: 110 U/L (ref 40–150)
Anion Gap: 7 mEq/L (ref 3–11)
BILIRUBIN TOTAL: 0.45 mg/dL (ref 0.20–1.20)
BUN: 17.5 mg/dL (ref 7.0–26.0)
CHLORIDE: 106 meq/L (ref 98–109)
CO2: 25 mEq/L (ref 22–29)
Calcium: 9.5 mg/dL (ref 8.4–10.4)
Creatinine: 1 mg/dL (ref 0.6–1.1)
Glucose: 119 mg/dl (ref 70–140)
Potassium: 4.4 mEq/L (ref 3.5–5.1)
SODIUM: 137 meq/L (ref 136–145)
TOTAL PROTEIN: 6.8 g/dL (ref 6.4–8.3)

## 2013-11-28 LAB — POCT INR: INR: 2.8

## 2013-11-28 NOTE — Progress Notes (Signed)
OFFICE PROGRESS NOTE   11/28/2013   Physicians:P.Clayton Bibles, MD (PCP [facility])/ [place], Kara Pacer   INTERVAL HISTORY:   Patient is seen, alone for visit, in continuing attention to advanced ovarian carcinoma, presently on oral VP16 which will complete cycle 2 on 12-01-13.  She is tolerating the etoposide well, with minimal queasiness for first couple of days each cycle but no other nausea. She has some nonpuritic rash on upper arms bilaterally new since start of etoposide. She has had increase in usual chronic urinary frequency, now up 4-6x/night to void. She denies dysuria, but may have had slight spotting from urethra (vs vaginal) x1 last week. She has had no fever, no pelvic pain.  She does not have PAC.  She remains on long term anticoagulation with coumadin + ASA, without symptoms of LE superficial or deep clots and no new neurologic symptoms.    ONCOLOGIC HISTORY   Ovarian cancer   06/12/2012 Initial Diagnosis Ovarian cancer   06/21/2012 - 08/16/2012 Chemotherapy dose dense paclitaxel and carboplatin   09/10/2012 Surgery suboptimal debulking    Chemotherapy liposomal dox planned but EF 15%   12/02/2012 - 04/28/2013 Chemotherapy gemcitabine x 10 cycles   05/22/2013 -  Chemotherapy Alimta   Patient developed abdominal symptoms in May 2013, saw GI Aug 2013, but was unable to tolerate prep for colonoscopy. She developed LLE superficial phlebitis and swelling in Jan 2014 , with acute thrombus left common femoral vein. 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 She was begun on lovenox and transitioned to Xarelto by PCP. CA 125 was 569. She saw Dr Alycia Rossetti on 06-12-12,  with 15 cm fixed pelvic mass. US paracentesis by IR 06-12-12 for 2.8 liters had cytology ( NZB14 - 126) positive for adenocarcinoma. Neoadjuvant dose dense carbo/taxol was given x 3 cycles 06-21-12 thru 08-16-12, with good partial response by CT 08-19-12 and CA 125 down from 1041 in Feb 2014 to 92 on 08-21-12. IVC filter placed prior to exploratory laparotomy with extensive lysis of adhesions, left ureteral lysis, and bilateral salpingo-oophorectomy by Dr Alycia Rossetti on 09-10-12, suboptimal debulking. Operative findings:diffuse carcinomatosis of the residual omentum involving the entire transverse colon. Transverse colon densely adherent to the anterior abdominal wall. No disease throughout the small bowel. Mesenteric adenopathy with lymph nodes measuring 1 cm, involving the root of the small bowel mesentery. ~5 centimeter right ovarian mass and ~ 6 cm left ovarian mass. Small bowel densely adherent to the pelvis. 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 Chemotherapy resumed with dose dense taxol carboplatin on 09-27-12, completing cycle 5 on 11-08-12. CA 125 increased from 92 in April to 130 post op in June, then 225 late July and repeat 318. CT CAP done 11-19-12 showed increase in nodular thickening along falciform ligament and small peritoneal nodule right iliac fossa. Plan was to change in chemotherapy to doxil with avastin, however echocardiogram 11-27-12 unexpectedly had EF of 15%. She  received one cycle of gemzar on 12-02-12, given peripherally, then had embolic CVAs on 1-94-17 while still on xarelto, with bleeding into the embolic CVA , and treated with heparin transitioned to coumadin. She was stable to  resume gemzar with cycle 2 at 800 mg/m2 given on 12-30-12,then platelets down to 70k by day 8. She had progressive superficial thrombophlebitis LE and was changed to full dose lovenox then; there was difficulty getting lovenox covered by insurance. Cycle 3 gemzar 01-13-13 was dose reduced to 600 mg/m2, however platelets still dropped to 90k; she had recurrent embolic CVAs on 07-30-12 and was hospitalized thru 01-28-13, treated with continuous heparin transitioned back to coumadin, plus baby ASA daily. Gemzar was subsequently tolerated well at 500 mg/m2, without significant thrombocytopenia, total 10 cycles given thru 04-28-13. She progressed in liver and nodes by CT 05-13-13. She wanted to continue treatment, with Alimta chosen due to comorbidities, begun 05-22-13. Platelets were 43k by day 9 cycle 1; neulasta was given day 8. Cycle 2 Alimta was dose reduced by 25% due to platelet nadir. CA 125 improved from >3000 at start of Alimta to 205 in late March prior to cycle 4; marker began increasing with cycle 6, up to 1214 by 10-06-13. Cycle 6 Alimta was 09-19-13. Repeat CT AP 10-10-13 confirmed progression in liver, mesenteric nodules and pelvis, with new small area in spleen and 2 mm nodule right pleura. She began oral etoposide on 10-14-13.   Review of systems as above, also: Fatigue with exertion but no SOB with lesser activity. Appetite ok, no bothersome taste changes. Bowels moving regularly.  Remainder of 10 point Review of Systems negative.  Objective:  Vital signs in last 24 hours:  BP 117/65  Pulse 76  Temp(Src) 98.9 F (37.2 C) (Oral)  Resp 18  Ht $R'5\' 7"'lS$  (1.702 m)  Wt 152 lb 1.6 oz (68.992 kg)  BMI 23.82 kg/m2 weight is stable  Alert, oriented and appropriate. Ambulatory without difficulty.  Alopecia  HEENT:PERRL, sclerae not icteric. Oral mucosa moist without lesions, posterior pharynx clear.  Neck supple. No JVD.  Lymphatics:no cervical,suraclavicular, axillary or inguinal adenopathy Resp:  somewhat diminished BS thruout as is baseline, otherwise clear to auscultation bilaterally and normal percussion bilaterally Cardio: regular rate and rhythm. No gallop. GI: soft, nontender, not distended, no mass or organomegaly. Some bowel sounds. Surgical incision not remarkable. Musculoskeletal/ Extremities: without pitting edema, cords, tenderness. Extensive varicosities and some discolored areas from previous superficial thrombophlebitis Neuro: no peripheral neuropathy. Otherwise nonfocal Skin without rash, ecchymosis, petechiae   Lab Results:  Results for orders placed in visit on 11/28/13  POCT INR      Result Value Ref Range   INR 2.8     CBC today WBC 3.3, ANC 2.5, Hgb 9.6, plt 203 CMET today normal with exception of alb 3.2 UA, C&S pending CA 125 available after visit down to 273, this having been 1181 on 10-27-13 and 1214 on 10-06-13  Studies/Results:  No results found.  Medications: I have reviewed the patient's current medications. Will decide if she needs empiric antibiotics depending on UA results and will f/u cx. She did not start oral iron until 11-27-13, concerned about constipation, but has decided to take this regularly and increase laxatives if needed.  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 Patient aware that we will let her know results of CA 125 pending.  Assessment/Plan:  1. 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 Tolerated 21 days of etoposide well for cycle 1 oral etoposide (6-23 thru 11-04-13). She will complete cycle 2 on  12-01-13, treating days 1-21 q 28 days. I will see her with counts about 2 weeks into next cycle. Referral to genetics counselor in case PARP inhibitor would be an option in future. 2.embolic CVAs fall 2536 and recurrent episodes of superficial thrombophlebitis + previous DVT. IVC  filter in. 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 Clotting has been more problematic when cancer is less well controlled. Discussed with coumadin clinic pharmacist today. 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 She is not active but otherwise not usually symptomatic. No changes other than prn increase in Bblocker at recent follow up with Dr Tamala Julian. This problem also limits choices of chemotherapy drugs.  4.DNR patient's request but still treating in attempt to control disease. HCPOA is friend Clair Gulling.  5.long past tobacco with COPD  6.progressive anemia: likely multifactorial with ongoing chemo and multiple blood draws. Patient initially reluctant to try oral iron, but agrees to this nowFollow. With cardiac disease I would transfuse if <=8.5 or if symptoms above that value. 7.urinary frequency: increased recently. Check urine pending. She is not interested in urology referral, tho that would be reasonable if problems persists to this extent.  All questions answered to her satisfaction. Time spent 25 min including >50% counseling and coordination of care    Sayler Mickiewicz P, MD   11/28/2013, 3:15 PM

## 2013-11-28 NOTE — Progress Notes (Signed)
INR = 2.8 on Coumadin 5 mg daily except 2.5 mg on Mon/Fri. Pt had 1 reported episode of vaginal bleeding on 11/15/13.  None since then. Pltc stable. Rash on arms bilaterally from PO VP-16. Pt seeing her neurologist next week for routine f/u. INR at goal.  No change to Coumadin dose. Return in 3 weeks. Kennith Center, Pharm.D., CPP 11/28/2013@2 :10 PM

## 2013-11-28 NOTE — Telephone Encounter (Signed)
per pof to sch appt-per ginna to sch CC after Dr Niel Hummer pt copy of sch

## 2013-11-29 LAB — URINE CULTURE

## 2013-11-29 LAB — CA 125: CA 125: 273.1 U/mL — ABNORMAL HIGH (ref 0.0–30.2)

## 2013-12-01 ENCOUNTER — Telehealth: Payer: Self-pay

## 2013-12-01 NOTE — Telephone Encounter (Signed)
Message copied by Baruch Merl on Mon Dec 01, 2013  6:56 PM ------      Message from: Gordy Levan      Created: Sat Nov 29, 2013 10:52 AM       Labs seen and need follow up: please let her know marker down to 273! And UA not obviously infected, tho we will wait on culture   thanks ------

## 2013-12-01 NOTE — Telephone Encounter (Signed)
Phone call - encounter closed. 

## 2013-12-01 NOTE — Telephone Encounter (Signed)
Monique [name], [place] - 11/28/13 More Detail >>      Gordy Levan, MD      Sent: Mon December 01, 2013  1:24 PM      To: Baruch Merl, RN       Cc: Christa See, RN              Result Note      Labs seen and need follow up: please let her know no UTI          Attached to      [facility] (Order# [id number]); CBC WITH DIFFERENTIAL (Order# [id number]); COMPREHENSIVE METABOLIC PANEL (GY17) (Order# [id number]); URINALYSIS, MICROSCOPIC - [facility] (Order# [id number]); CA 125 (Order# [id number]); URINE CULTURE (Order# [id number])            Urine Culture  Status: Final result     Visible to patient: This result is not viewable by the patient.     Next appt: 12/05/2013 at 01:30 PM in Neurology Chauncy Passy, Rudi Rummage, NP)     Dx: Urinary frequency            Notes Recorded by Gordy Levan, MD on 12/01/2013 at 1:24 PM Labs seen and need follow up: please let her know no UTI      3d ago     Urine Culture, Routine Culture, Urine       Comments: Final - ===== COLONY COUNT: ===== 20,OOO COLONIES/ML Multiple bacterial morphotypes present, none predominant. Suggest appropriate recollection if  clinically indicated.    Resulting Agency [facility]          Result Narrative            Performed at:  [address], [address]               [place], [place]?  [facility]           Specimen Collected: 11/28/13  3:40 PM Last Resulted: 11/29/13  5:10 PM Lab Flowsheet Order Details View Encounter Lab and Collection Details Routing Result History               Previously Reviewed Results                Urinalysis with microscopic - [facility]  Status: Final result     Visible to patient: This result is not viewable by the patient.     Next appt: 12/05/2013 at 01:30 PM in Neurology Chauncy Passy, Rudi Rummage, NP)     Dx: Urinary frequency            Notes Recorded by Gordy Levan, MD on 12/01/2013 at 1:24 PM Labs seen and need follow up: please let her know no  UTI Notes Recorded by Gordy Levan, MD on 11/29/2013 at 10:52 AM Labs seen and need follow up: please let her know marker down to 273! And UA not obviously infected, tho we will wait on culture   thanks        Ref Range 3d ago (11/28/13) 36mo ago (01/21/13) 8mo ago (01/21/13) 15mo ago (12/04/12) 41mo ago (12/04/12)     Glucose Negative mg/dL Negative               Comments: Note: New unit of measure     Bilirubin (Urine) Negative  Negative  Ketones Negative mg/dL Negative                Specific Gravity, Urine 1.003 - 1.035  1.030      1.021 R     1.022 R       Blood Negative  Large                pH 4.6 - 8.0  6.0      6.0 R     6.5 R       Protein Negative- <30 mg/dL < 30      NEGATIVE R     NEGATIVE R       Urobilinogen, UR 0.2 - 1 mg/dL 0.2                Nitrite Negative  Negative      NEGATIVE R     NEGATIVE R       Leukocyte Esterase Negative  Negative                RBC / HPF 0 - 2  3-6    3-6 R     3-6 R         WBC, UA 0 - 2  Negative    3-6 R     0-2 R         Bacteria, UA Negative- Trace  Trace    RARE R     FEW (A) R         Mucus, UA Negative- Small  Small               Resulting Agency   [facility]          Result Narrative            Performed At:  [facility].               [place], [place] [address]           Specimen Collected: 11/28/13  3:41 PM Last Resulted: 11/28/13  3:58 PM Lab Flowsheet Order Details View Encounter Lab and Collection Details Routing Result History  R=Reference range differs from displayed range              CA 125  Status: Final result     Visible to patient: This result is not viewable by the patient.     Next appt: 12/05/2013 at 01:30 PM in Neurology Chauncy Passy, Rudi Rummage, NP)     Dx: Ovarian cancer, unspecified laterality               Notes Recorded by Gordy Levan, MD on 12/01/2013 at 1:24 PM Labs seen and need follow up:  please let her know no UTI Notes Recorded by Gordy Levan, MD on 11/29/2013 at 10:52 AM Labs seen and need follow up: please let her know marker down to 273! And UA not obviously infected, tho we will wait on culture   thanks        Ref Range 3d ago (11/28/13) 78mo ago (10/27/13) 60mo ago (10/06/13) 30mo ago (09/12/13) 69mo ago (08/15/13) 41mo ago (07/21/13) 40mo ago (07/02/13)     CA 125 0.0 - 30.2 U/mL 273.1 (H)    1181.0 (H) CM   1214.0 (H) CM   447.1 (H)    264.1 (H)    205.0 (H)  231.2 (H)       Resulting Agency   [facility]          Result Narrative            Performed at:  [address]               [place], [place] [address]           Specimen Collected: 11/28/13  1:31 PM Last Resulted: 11/29/13  5:54 AM Lab Flowsheet Order Details View Encounter Lab and Collection Details Routing Result History  CM=Additional comments              Comprehensive metabolic panel (Cmet) - [facility]  Status: Final result     Visible to patient: This result is not viewable by the patient.     Next appt: 12/05/2013 at 01:30 PM in Neurology Chauncy Passy, Rudi Rummage, NP)     Dx: Ovarian cancer, unspecified laterality               Notes Recorded by Gordy Levan, MD on 12/01/2013 at 1:24 PM Labs seen and need follow up: please let her know no UTI Notes Recorded by Gordy Levan, MD on 11/29/2013 at 10:52 AM Labs seen and need follow up: please let her know marker down to 273! And UA not obviously infected, tho we will wait on culture   thanks        Ref Range 3d ago (11/28/13) 3wk ago (11/07/13) 53mo ago (10/27/13) 28mo ago (10/06/13) 34mo ago (09/12/13) 7mo ago (08/27/13) 34mo ago (08/15/13)     Sodium 136 - 145 mEq/L 137    138    137    136    139    138    137        Potassium 3.5 - 5.1 mEq/L 4.4    4.1    4.5    4.8    5.0    4.9    5.3 (H)        Chloride 98 - 109 mEq/L 106     108    105    104    103    110 (H)    104        CO2 22 - 29 mEq/L 25    23    26    25    24    20  (L)    25        Glucose 70 - 140 mg/dl 119    118    80    86    91    110    96        BUN 7.0 - 26.0 mg/dL 17.5    17.1    15.5    23.5    20.5    25.3    21.2        Creatinine 0.6 - 1.1 mg/dL 1.0    0.8    1.0    1.1    1.1    1.0    1.1        Total Bilirubin 0.20 - 1.20 mg/dL 0.45    0.32    0.41    0.27    0.31    0.31    0.24  Alkaline Phosphatase 40 - 150 U/L 110    117    122    197 (H)    153 (H)    124    161 (H)        AST 5 - 34 U/L 22    23    27    30    27    26    27         ALT 0 - 55 U/L 8    10    11    15    12    9    12         Total Protein 6.4 - 8.3 g/dL 6.8    6.7    6.8    7.3    7.4    7.6    7.5        Albumin 3.5 - 5.0 g/dL 3.2 (L)    3.1 (L)    3.1 (L)    3.0 (L)    3.5    3.6    3.4 (L)        Calcium 8.4 - 10.4 mg/dL 9.5    9.2    9.6    9.7    9.8    10.3    9.7        Anion Gap 3 - 11 mEq/L 7    7    5    8    12  (H)    8    8       Resulting Agency   [facility]          Result Narrative            Performed At:  [facility]               [address]               [place], [place] [address]           Specimen Collected: 11/28/13  1:31 PM Last Resulted: 11/28/13  2:30 PM Lab Flowsheet Order Details View Encounter Lab and Collection Details Routing Result History            Protime-INR  Status: Final result     Visible to patient: This result is not viewable by the patient.     Next appt: 12/05/2013 at 01:30 PM in Neurology Chauncy Passy, Rudi Rummage, NP)     Dx: Personal history of venous thrombosis.Marland KitchenMarland Kitchen               Notes Recorded by Gordy Levan, MD on 12/01/2013 at 1:24 PM Labs seen and need follow up: please let her know no UTI Notes Recorded by Gordy Levan, MD on 11/29/2013 at  10:52 AM Labs seen and need follow up: please let her know marker down to 273! And UA not obviously infected, tho we will wait on culture   thanks        Ref Range 3d ago (11/28/13) 3d ago (11/28/13) 3wk ago (11/07/13) 3wk ago (11/07/13) 75mo ago (10/27/13) 8mo ago (10/27/13) 77mo ago (10/21/13)     Protime 10.6 - 13.4 Seconds 33.6 (H)      28.8 (H)        25.2 (H)    24.0 (H)        INR 2.00 - 3.50  2.80  2.8 R   2.40 CM   2.4 R   2.1 R   2.10 CM   2.00 CM      Comments: INR is useful only to assess adequacy of anticoagulation with coumadin when comparing results from different labs. It should not be used to estimate bleeding risk or presence/abscense of coagulopathy in patients not on coumadin. Expected INR ranges for  nontherapeutic patients is 0.88 - 1.12.     Lovenox   No      No        No    No       Resulting Agency   [facility]  [facility]   [facility]          Result Narrative            With each coumadin clinicPerformed At:  [facility]               [address]               [place], [place] [address]           Specimen Collected: 11/28/13  1:30 PM Last Resulted: 11/28/13  1:41 PM Lab Flowsheet Order Details View Encounter Lab and Collection Details Routing Result History  CM=Additional comments  R=Reference range differs from displayed range              CBC with Differential  Status: Final result     Visible to patient: This result is not viewable by the patient.     Next appt: 12/05/2013 at 01:30 PM in Neurology Chauncy Passy, Rudi Rummage, NP)     Dx: Ovarian cancer, unspecified laterality               Notes Recorded by Gordy Levan, MD on 12/01/2013 at 1:24 PM Labs seen and need follow up: please let her know no UTI Notes Recorded by Gordy Levan, MD on 11/29/2013 at 10:52 AM Labs seen and need follow up: please let her know marker down to 273! And UA not obviously infected, tho we will wait on culture   thanks        Ref Range 3d  ago (11/28/13) 3wk ago (11/07/13) 39mo ago (10/27/13) 53mo ago (10/21/13) 79mo ago (10/06/13) 2mo ago (09/19/13) 40mo ago (09/12/13)     WBC 3.9 - 10.3 10e3/uL 3.3 (L)    3.5 (L)    4.3    6.6    7.2    9.4    8.0        NEUT# 1.5 - 6.5 10e3/uL 2.5    2.5    3.2    5.1    4.9    7.9 (H)    6.1        HGB 11.6 - 15.9 g/dL 9.6 (L)    9.3 (L)    9.8 (L)    10.7 (L)    10.5 (L)    10.9 (L)    11.2 (L)        HCT 34.8 - 46.6 % 29.9 (L)    29.2 (L)    30.1 (L)    32.7 (L)    32.9 (L)    33.1 (L)    35.2        Platelets 145 - 400 10e3/uL 203    248    204    222    306    274    282  MCV 79.5 - 101.0 fL 94.0    94.7    92.6    90.8    92.7    92.5    95.5        MCH 25.1 - 34.0 pg 30.2    30.4    30.2    29.7    29.6    30.4    30.5        MCHC 31.5 - 36.0 g/dL 32.1    32.1    32.6    32.7    31.9    32.9    31.9        RBC 3.70 - 5.45 10e6/uL 3.18 (L)    3.08 (L)    3.25 (L)    3.60 (L)    3.55 (L)    3.58 (L)    3.68 (L)        RDW 11.2 - 14.5 % 17.8 (H)    16.2 (H)    15.0 (H)    14.8 (H)    14.7 (H)    14.8 (H)    15.4 (H)        lymph# 0.9 - 3.3 10e3/uL 0.6 (L)    0.6 (L)    0.7 (L)    1.0    1.0    0.7 (L)    0.8 (L)        MONO# 0.1 - 0.9 10e3/uL 0.2    0.3    0.3    0.4    1.1 (H)    0.9    0.9        Eosinophils Absolute 0.0 - 0.5 10e3/uL 0.1    0.1    0.1    0.1    0.2    0.0    0.1        Basophils Absolute 0.0 - 0.1 10e3/uL 0.0    0.0    0.0    0.0    0.0    0.0    0.1        NEUT% 38.4 - 76.8 % 73.6    70.7    74.8    77.8 (H)    68.5    83.2 (H)    75.9        LYMPH% 14.0 - 49.7 % 17.4    17.0    15.5    14.5    14.3    7.6 (L)    10.5 (L)        MONO% 0.0 - 14.0 % 6.9    7.8    6.6    6.1    14.7 (H)    9.0    11.2        EOS% 0.0 - 7.0 % 1.8    3.1    2.6    1.1    2.1    0.1    1.6        BASO% 0.0 - 2.0 % 0.3    1.4    0.5    0.5    0.4    0.1    0.8        Resulting Agency   [facility]          Result Narrative            Performed At:  [facility]  [address]               [place], [place] [address]           Specimen Collected: 11/28/13  1:30 PM Last Resulted: 11/28/13  1:47 PM Lab Flowsheet Order Details View Encounter Lab and Collection Details Routing Result History

## 2013-12-02 NOTE — Telephone Encounter (Signed)
Spoke with Monique [name] about the Ca-125 marker as noted below as well as no UTI.  Pt. Verbalized understanding.

## 2013-12-05 ENCOUNTER — Ambulatory Visit: Payer: Self-pay | Admitting: Nurse Practitioner

## 2013-12-09 ENCOUNTER — Ambulatory Visit (HOSPITAL_BASED_OUTPATIENT_CLINIC_OR_DEPARTMENT_OTHER): Payer: Medicare Other

## 2013-12-09 ENCOUNTER — Telehealth: Payer: Self-pay

## 2013-12-09 ENCOUNTER — Encounter: Payer: Self-pay | Admitting: Oncology

## 2013-12-09 ENCOUNTER — Encounter: Payer: Self-pay | Admitting: *Deleted

## 2013-12-09 DIAGNOSIS — C569 Malignant neoplasm of unspecified ovary: Secondary | ICD-10-CM

## 2013-12-09 DIAGNOSIS — K121 Other forms of stomatitis: Secondary | ICD-10-CM

## 2013-12-09 DIAGNOSIS — C779 Secondary and unspecified malignant neoplasm of lymph node, unspecified: Secondary | ICD-10-CM

## 2013-12-09 DIAGNOSIS — C787 Secondary malignant neoplasm of liver and intrahepatic bile duct: Secondary | ICD-10-CM

## 2013-12-09 DIAGNOSIS — Z86718 Personal history of other venous thrombosis and embolism: Secondary | ICD-10-CM

## 2013-12-09 LAB — CBC WITH DIFFERENTIAL/PLATELET
BASO%: 0.5 % (ref 0.0–2.0)
Basophils Absolute: 0 10e3/uL (ref 0.0–0.1)
EOS%: 2.4 % (ref 0.0–7.0)
Eosinophils Absolute: 0.1 10e3/uL (ref 0.0–0.5)
HCT: 30.1 % — ABNORMAL LOW (ref 34.8–46.6)
HGB: 9.8 g/dL — ABNORMAL LOW (ref 11.6–15.9)
LYMPH%: 22.6 % (ref 14.0–49.7)
MCH: 30.4 pg (ref 25.1–34.0)
MCHC: 32.6 g/dL (ref 31.5–36.0)
MCV: 93.5 fL (ref 79.5–101.0)
MONO#: 0.7 10e3/uL (ref 0.1–0.9)
MONO%: 17.7 % — ABNORMAL HIGH (ref 0.0–14.0)
NEUT#: 2.1 10e3/uL (ref 1.5–6.5)
NEUT%: 56.8 % (ref 38.4–76.8)
Platelets: 246 10e3/uL (ref 145–400)
RBC: 3.22 10e6/uL — ABNORMAL LOW (ref 3.70–5.45)
RDW: 18.8 % — ABNORMAL HIGH (ref 11.2–14.5)
WBC: 3.7 10e3/uL — ABNORMAL LOW (ref 3.9–10.3)
lymph#: 0.8 10e3/uL — ABNORMAL LOW (ref 0.9–3.3)
nRBC: 0 % (ref 0–0)

## 2013-12-09 MED ORDER — TRIAMCINOLONE ACETONIDE 0.1 % MT PSTE: PASTE | OROMUCOSAL | Status: DC

## 2013-12-09 NOTE — Telephone Encounter (Addendum)
Monique [name] will come in to check CBC today at 1330. Will send in dental paste to her pharmacy.  She will keep in mind the Baking soda rinse and Biotene. Told her that she should wait until the sores are really better as noted below by Dr. Marko Plume.

## 2013-12-09 NOTE — Telephone Encounter (Signed)
Ms. Kernes called stating that she is to begin C3 of Etoposide x21 days this evening. She started with some mouth sores last Monday morning and they continued all week ( break week).  They were uncomfortable.   She woke up this am and mouth is much better.    Does she need to go ahead and start cycle 3 this evening?   Is there something is can use to rinse her mouth with to help with the sores in the future? F/U appointment with Dr. Marko Plume is 12-22-13.

## 2013-12-09 NOTE — Progress Notes (Signed)
RECEIVED A FAX FROM [facility] CONCERNING A PRIOR AUTHORIZATION FOR TRIAMCINOLONE ACETON 0.1%. THIS REQUEST WAS PLACED IN THE MANAGED CARE BIN.

## 2013-12-09 NOTE — Progress Notes (Signed)
Faxed triamcinolone pa form to [facility]

## 2013-12-09 NOTE — Telephone Encounter (Signed)
Told Monique [name] that her WBC looked good. She will wait to start C3 of Etoposide a couple to days to make sure the sores are really better.

## 2013-12-09 NOTE — Telephone Encounter (Signed)
         Sander Nephew ','<More Detail >>       Gordy Levan, MD       Sent: Tue December 09, 2013 11:06 AM    To: Baruch Merl, RN                   Message     OK for cbc today. I don't think I need to see her if it is better. Would wait to start etoposide until mouth is really better    BIotene is always good. Baking soda in water is always good.    If problem is tiny ulcers in mouth, try triamcinolone acetomide dental paste to the areas bid.(prescription)    If it seemed to be thrush, can try nystatin mouthwash tid.        thanks     ----- Message -----    From: Baruch Merl, RN    Sent: 12/09/2013 10:52 AM    To: Gordy Levan, MD        Lennis,    Do you want Ms. Daley to get labs today ?    You do have an opening tomorrow at 0900 at this point in time.    What directions/medication do you want to give for oral care?

## 2013-12-15 ENCOUNTER — Encounter: Payer: Self-pay | Admitting: Oncology

## 2013-12-15 NOTE — Progress Notes (Signed)
[employer] approved triamcinolone from 12/09/13-12/10/14

## 2013-12-19 ENCOUNTER — Other Ambulatory Visit: Payer: Self-pay | Admitting: *Deleted

## 2013-12-19 DIAGNOSIS — C569 Malignant neoplasm of unspecified ovary: Secondary | ICD-10-CM

## 2013-12-19 MED ORDER — WARFARIN SODIUM 5 MG PO TABS: 5.0000 mg | ORAL_TABLET | Freq: Every day | ORAL | Status: DC

## 2013-12-20 ENCOUNTER — Other Ambulatory Visit: Payer: Self-pay | Admitting: Oncology

## 2013-12-20 DIAGNOSIS — C569 Malignant neoplasm of unspecified ovary: Secondary | ICD-10-CM

## 2013-12-22 ENCOUNTER — Ambulatory Visit: Payer: Medicare Other

## 2013-12-22 ENCOUNTER — Other Ambulatory Visit: Payer: Medicare Other

## 2013-12-22 ENCOUNTER — Telehealth: Payer: Self-pay | Admitting: Oncology

## 2013-12-22 ENCOUNTER — Ambulatory Visit (HOSPITAL_BASED_OUTPATIENT_CLINIC_OR_DEPARTMENT_OTHER): Payer: Self-pay | Admitting: Pharmacist

## 2013-12-22 ENCOUNTER — Ambulatory Visit (HOSPITAL_BASED_OUTPATIENT_CLINIC_OR_DEPARTMENT_OTHER): Payer: Medicare Other | Admitting: Oncology

## 2013-12-22 ENCOUNTER — Other Ambulatory Visit (HOSPITAL_BASED_OUTPATIENT_CLINIC_OR_DEPARTMENT_OTHER): Payer: Medicare Other

## 2013-12-22 ENCOUNTER — Encounter: Payer: Self-pay | Admitting: Oncology

## 2013-12-22 ENCOUNTER — Ambulatory Visit: Payer: Medicare Other | Admitting: Oncology

## 2013-12-22 VITALS — BP 117/63 | HR 70 | Temp 98.2°F | Resp 20 | Ht 67.0 in | Wt 152.6 lb

## 2013-12-22 DIAGNOSIS — Z8673 Personal history of transient ischemic attack (TIA), and cerebral infarction without residual deficits: Secondary | ICD-10-CM

## 2013-12-22 DIAGNOSIS — I82409 Acute embolism and thrombosis of unspecified deep veins of unspecified lower extremity: Secondary | ICD-10-CM

## 2013-12-22 DIAGNOSIS — C569 Malignant neoplasm of unspecified ovary: Secondary | ICD-10-CM

## 2013-12-22 DIAGNOSIS — C772 Secondary and unspecified malignant neoplasm of intra-abdominal lymph nodes: Secondary | ICD-10-CM

## 2013-12-22 DIAGNOSIS — I8000 Phlebitis and thrombophlebitis of superficial vessels of unspecified lower extremity: Secondary | ICD-10-CM

## 2013-12-22 DIAGNOSIS — C563 Malignant neoplasm of bilateral ovaries: Secondary | ICD-10-CM

## 2013-12-22 DIAGNOSIS — C787 Secondary malignant neoplasm of liver and intrahepatic bile duct: Secondary | ICD-10-CM

## 2013-12-22 DIAGNOSIS — C562 Malignant neoplasm of left ovary: Principal | ICD-10-CM

## 2013-12-22 DIAGNOSIS — C561 Malignant neoplasm of right ovary: Secondary | ICD-10-CM

## 2013-12-22 DIAGNOSIS — Z86718 Personal history of other venous thrombosis and embolism: Secondary | ICD-10-CM

## 2013-12-22 DIAGNOSIS — I639 Cerebral infarction, unspecified: Secondary | ICD-10-CM

## 2013-12-22 DIAGNOSIS — D649 Anemia, unspecified: Secondary | ICD-10-CM

## 2013-12-22 LAB — COMPREHENSIVE METABOLIC PANEL (CC13)
ALBUMIN: 3.2 g/dL — AB (ref 3.5–5.0)
ALT: 12 U/L (ref 0–55)
AST: 23 U/L (ref 5–34)
Alkaline Phosphatase: 110 U/L (ref 40–150)
Anion Gap: 7 mEq/L (ref 3–11)
BUN: 17.1 mg/dL (ref 7.0–26.0)
CALCIUM: 9.5 mg/dL (ref 8.4–10.4)
CHLORIDE: 105 meq/L (ref 98–109)
CO2: 24 meq/L (ref 22–29)
Creatinine: 0.9 mg/dL (ref 0.6–1.1)
GLUCOSE: 97 mg/dL (ref 70–140)
POTASSIUM: 4 meq/L (ref 3.5–5.1)
SODIUM: 136 meq/L (ref 136–145)
Total Bilirubin: 0.5 mg/dL (ref 0.20–1.20)
Total Protein: 6.9 g/dL (ref 6.4–8.3)

## 2013-12-22 LAB — PROTIME-INR
INR: 3.3 (ref 2.00–3.50)
PROTIME: 39.6 s — AB (ref 10.6–13.4)

## 2013-12-22 LAB — CBC WITH DIFFERENTIAL/PLATELET
BASO%: 0.3 % (ref 0.0–2.0)
BASOS ABS: 0 10*3/uL (ref 0.0–0.1)
EOS%: 1.6 % (ref 0.0–7.0)
Eosinophils Absolute: 0.1 10*3/uL (ref 0.0–0.5)
HCT: 30.8 % — ABNORMAL LOW (ref 34.8–46.6)
HGB: 9.8 g/dL — ABNORMAL LOW (ref 11.6–15.9)
LYMPH#: 0.4 10*3/uL — AB (ref 0.9–3.3)
LYMPH%: 13.2 % — ABNORMAL LOW (ref 14.0–49.7)
MCH: 30 pg (ref 25.1–34.0)
MCHC: 31.8 g/dL (ref 31.5–36.0)
MCV: 94.2 fL (ref 79.5–101.0)
MONO#: 0.2 10*3/uL (ref 0.1–0.9)
MONO%: 6 % (ref 0.0–14.0)
NEUT%: 78.9 % — ABNORMAL HIGH (ref 38.4–76.8)
NEUTROS ABS: 2.5 10*3/uL (ref 1.5–6.5)
Platelets: 237 10*3/uL (ref 145–400)
RBC: 3.27 10*6/uL — AB (ref 3.70–5.45)
RDW: 18.5 % — AB (ref 11.2–14.5)
WBC: 3.2 10*3/uL — AB (ref 3.9–10.3)

## 2013-12-22 LAB — POCT INR: INR: 3.3

## 2013-12-22 NOTE — Progress Notes (Signed)
INR slightly above goal today. Hg/Hct:  9.8/30.8, Pltc:  237 No changes in medications. Her appetite has been "off a little lately." No missed or extra coumadin doses. Usual bruising. Vaginal bleeding x 1 on 8/27. Small amount. Pt is scheduled for f/u evaluation with Dr. Alycia Rossetti. No s/s of clotting noted. Hold coumadin today.   On 12/23/13, continue coumadin 5mg  daily except 2.5mg  on Mondays & Fridays.   Return on 01/14/14 at 9am for lab, 9:30am for appt with Dr. Marko Plume and 10am for Coumadin clinic.

## 2013-12-22 NOTE — Telephone Encounter (Signed)
, °

## 2013-12-22 NOTE — Patient Instructions (Signed)
Try Claritin (lortadine) 10 mg daily for itching

## 2013-12-22 NOTE — Progress Notes (Signed)
OFFICE PROGRESS NOTE   12/22/2013   Physicians:P.Clayton Bibles, MD (PCP [facility])/ Bandera, Kara Pacer   INTERVAL HISTORY:  Patient is seen, alone for visit, now ~ day 12 cycle 3 oral etoposide for recurrent ovarian cancer. She has tolerated etoposide well, with no nausea, no worse fatigue and no cytopenias. Last imaging was CT AP 10-10-13. CA 125, which was 1214 as baseline for etoposide in June, was down to 273 in early August. 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  Constipation has resolved since she stopped premedicating etoposide with qhs zofran. No abdominal or pelvic discomfort. She has had minimal spotting which may be vaginal late July and again last week, no bladder symptoms. No symptoms of blood clots. No new or different neurologic symptoms.  She does not have PAC. She has IVC filter. She is for genetics counseling on 12-24-13.   ONCOLOGIC HISTORY   Ovarian cancer   06/12/2012 Initial Diagnosis Ovarian cancer   06/21/2012 - 08/16/2012 Chemotherapy dose dense paclitaxel and carboplatin   09/10/2012 Surgery suboptimal debulking    Chemotherapy liposomal dox planned but EF 15%   12/02/2012 - 04/28/2013 Chemotherapy gemcitabine x 10 cycles   05/22/2013 -  Chemotherapy Alimta   Patient developed abdominal symptoms in May 2013, saw GI Aug 2013, but was unable to tolerate prep for colonoscopy. She developed LLE superficial phlebitis and swelling in Jan 2014 , with acute thrombus left common femoral vein. 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 She was begun on lovenox and transitioned to Xarelto by PCP. CA 125 was 569.  She saw Dr Alycia Rossetti on 06-12-12, with 15 cm fixed pelvic mass. US paracentesis by IR 06-12-12 for 2.8 liters had cytology ( [id number] - 126) positive for adenocarcinoma. Neoadjuvant dose dense carbo/taxol was given x 3 cycles 06-21-12 thru 08-16-12, with good partial response by CT 08-19-12 and CA 125 down from 1041 in Feb 2014 to 92 on 08-21-12. IVC filter placed prior to exploratory laparotomy with extensive lysis of adhesions, left ureteral lysis, and bilateral salpingo-oophorectomy by Dr Alycia Rossetti on 09-10-12, suboptimal debulking. Operative findings:diffuse carcinomatosis of the residual omentum involving the entire transverse colon. Transverse colon densely adherent to the anterior abdominal wall. No disease throughout the small bowel. Mesenteric adenopathy with lymph nodes measuring 1 cm, involving the root of the small bowel mesentery. ~5 centimeter right ovarian mass and ~ 6 cm left ovarian mass. Small bowel densely adherent to the pelvis. 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 Chemotherapy resumed with dose dense taxol carboplatin on 09-27-12, completing cycle 5 on 11-08-12. CA 125 increased from 92 in April to 130 post op in June, then 225 late July and repeat 318. CT CAP done 11-19-12 showed increase in nodular thickening along falciform ligament and small peritoneal nodule right iliac fossa. Plan was to change in chemotherapy to doxil with avastin, however echocardiogram 11-27-12 unexpectedly had EF of 15%.  She received one cycle of gemzar on 12-02-12, given peripherally, then had embolic CVAs on 09-08-59 while still on xarelto, with bleeding into the embolic CVA , and treated with heparin transitioned  to coumadin. She was stable to resume gemzar with cycle 2 at 800 mg/m2 given on 12-30-12,then platelets down to 70k by day 8. She had progressive superficial thrombophlebitis LE and was changed to full dose lovenox then; there was difficulty getting lovenox covered by insurance. 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 Gemzar was subsequently tolerated well at 500 mg/m2, without significant thrombocytopenia, total 10 cycles given thru 04-28-13. She progressed in liver and nodes by CT 05-13-13. She wanted to continue treatment, with Alimta chosen due to comorbidities, begun 05-22-13. Platelets were 43k by day 9 cycle 1; neulasta was given day 8. Cycle 2 Alimta was dose reduced by 25% due to platelet nadir. CA 125 improved from >3000 at start of Alimta to 205 in late March prior to cycle 4; marker began increasing with cycle 6, up to 1214 by 10-06-13. Cycle 6 Alimta was 09-19-13. Repeat CT AP 10-10-13 confirmed progression in liver, mesenteric nodules and pelvis, with new small area in spleen and 2 mm nodule right pleura. She began oral etoposide on 10-14-13.   Review of systems as above, also: No fever or symptoms of infection. No other bleeding. No increased SOB. No chest pain or palpitations.  Remainder of 10 point Review of Systems negative.  Objective:  Vital signs in last 24 hours:  BP 117/63  Pulse 70  Temp(Src) 98.2 F (36.8 C) (Oral)  Resp 20  Ht 5\' 7"  (1.702 m)  Wt 152 lb 9.6 oz (69.219 kg)  BMI 23.89 kg/m2 Weight is stable Alert, oriented and appropriate. Ambulatory without difficulty.  Alopecia  HEENT:PERRL, sclerae not icteric. Oral mucosa moist without lesions, posterior pharynx clear.  Neck supple. No JVD.  Lymphatics:no cervical,suraclavicular or inguinal adenopathy Resp:  clear to auscultation bilaterally and normal percussion bilaterally Cardio: regular rate and rhythm. No gallop. GI: soft, nontender, not distended, no mass or organomegaly. Normally active bowel sounds. Surgical incision not remarkable. Musculoskeletal/ Extremities: without pitting edema, cords, tenderness. Extensive LE varicosities as baseline. Neuro: speech fluent and appropriate tho not very talkative. No focal deficits. PSYCH appropriate mood and affect Skin without rash, ecchymosis, petechiae   Lab Results:  Results for orders placed in visit on 12/22/13  CBC WITH DIFFERENTIAL      Result Value Ref Range   WBC 3.2 (*) 3.9 - 10.3 10e3/uL   NEUT# 2.5  1.5 - 6.5 10e3/uL   HGB 9.8 (*) 11.6 - 15.9 g/dL   HCT 30.8 (*) 34.8 - 46.6 %   Platelets 237  145 - 400 10e3/uL   MCV 94.2  79.5 - 101.0 fL   MCH 30.0  25.1 - 34.0 pg   MCHC 31.8  31.5 - 36.0 g/dL   RBC 3.27 (*) 3.70 - 5.45 10e6/uL   RDW 18.5 (*) 11.2 - 14.5 %   lymph# 0.4 (*) 0.9 - 3.3 10e3/uL   MONO# 0.2  0.1 - 0.9 10e3/uL   Eosinophils Absolute 0.1  0.0 - 0.5 10e3/uL   Basophils Absolute 0.0  0.0 - 0.1 10e3/uL   NEUT% 78.9 (*) 38.4 - 76.8 %   LYMPH% 13.2 (*) 14.0 - 49.7 %  MONO% 6.0  0.0 - 14.0 %   EOS% 1.6  0.0 - 7.0 %   BASO% 0.3  0.0 - 2.0 %  PROTIME-INR      Result Value Ref Range   Protime 39.6 (*) 10.6 - 13.4 Seconds   INR 3.30  2.00 - 3.50   Lovenox No      CMET available after visit normal with exception of albumin 3.2  CA 125 available after visit by previous lab methodology 355, this having been 273 in early August; by new methodology result is 700.  Studies/Results:  No results found.  Medications: I have reviewed the patient's current medications. OK to stop folic acid now out >2 months from alimta  DISCUSSION: explained that lab reference range/ methodology is changing for CA125, tho this will be run with both methods for next 2 evaluations in order to correlate; she is aware that the new testing  seems to give a higher number, but we will just know the correlation as this change is made.   With question of vaginal spotting, will request visit back to Dr Alycia Rossetti.  Assessment/Plan:  1. 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 Tolerating cycle 3 etoposide well, marker as above. Referral to genetics counselor upcoming in case PARP inhibitor would be an option in future. Slight vaginal spotting so have asked gyn oncology to see in next several weeks. 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 IVC filter in and continuing coumadin. Clotting has been more problematic when cancer is less well controlled. Agree with coumadin clinic pharmacist recommendations today.  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 She is not active but otherwise not usually symptomatic. No changes other than prn increase in Bblocker at recent follow up with Dr Tamala Julian. This problem also limits choices of chemotherapy drugs.  4.DNR patient's request but still treating in attempt to control disease. HCPOA is friend Clair Gulling.  5.long past tobacco with COPD  6.anemia: does not seem more symptomatic, follow on oral iron   I will see her back with cycle 4 etoposide. Time spent 25 min including >50% counseling and coordination of care.   Chidera Dearcos P, MD   12/22/2013, 10:24 AM

## 2013-12-22 NOTE — Patient Instructions (Signed)
Hold coumadin today.   On 12/23/13, continue coumadin 5mg  daily except 2.5mg  on Mondays & Fridays.   Return on 01/14/14 at 9am for lab, 9:30am for appt with Dr. Marko Plume and 10am for Coumadin clinic.

## 2013-12-23 ENCOUNTER — Telehealth: Payer: Self-pay

## 2013-12-23 LAB — CA 125: CA 125: 700 U/mL — AB (ref <35)

## 2013-12-23 LAB — CA 125(PREVIOUS METHOD): CA 125: 355.3 U/mL — AB (ref 0.0–30.2)

## 2013-12-23 NOTE — Telephone Encounter (Signed)
Message copied by Baruch Merl on Tue Dec 23, 2013 11:38 AM ------      Message from: Gordy Levan      Created: Tue Dec 23, 2013  8:11 AM       Labs seen and need follow up: please let her know CA125 by the lab method that we have been using up until now is 355, which is a bit higher and we will watch it closely, but no change in oral etoposide for now. The new lab method result is not available yet. ------

## 2013-12-23 NOTE — Telephone Encounter (Signed)
Told Ms. Krock the results of the Ca-125 as noted below.  Ms. Welp verbalized understanding.

## 2013-12-24 ENCOUNTER — Ambulatory Visit (HOSPITAL_BASED_OUTPATIENT_CLINIC_OR_DEPARTMENT_OTHER): Payer: Medicare Other | Admitting: Genetic Counselor

## 2013-12-24 ENCOUNTER — Ambulatory Visit: Payer: Medicare Other

## 2013-12-24 DIAGNOSIS — C569 Malignant neoplasm of unspecified ovary: Secondary | ICD-10-CM

## 2013-12-24 DIAGNOSIS — IMO0002 Reserved for concepts with insufficient information to code with codable children: Secondary | ICD-10-CM

## 2013-12-24 NOTE — Progress Notes (Signed)
HISTORY OF PRESENT ILLNESS: Dr. Marko Plume requested a cancer genetics consultation for Monique [name], a 68 y.o. female, due to a personal history of ovarian cancer.  Monique [name] presents to clinic today to discuss the possibility of a hereditary predisposition to cancer, genetic testing, and to further clarify her future cancer risks, as well as potential cancer risk for family members. Monique [name] was diagnosed with ovarian cancer at the age of 77. This was a high grade serous bilateral diagnosis. She is s/p bilateral salpingo-oophorectomy and is interested in BRCA testing aid in chemotherapy treatment decisions.    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    "just affected my speech; I've had speech  therapy" (01/21/2013)   DVT (deep venous thrombosis) 04/2012    "BLE" (01/21/2013)   Chronic bronchitis     "I had it several times; I used to smoke" (01/21/2013)   Shortness of breath     "trouble taking a deep breath at times before RX started; I'm fine now" (01/21/2013)   Anemia     "because of the chemo" (01/21/2013)   Arthritis     "little in my right hand" (01/21/2013)   Anxiety     "related to ovarian cancer; don't take RX for it" (01/21/2013)   Basal cell carcinoma of face    Ovarian cancer dx'd 2013    Past Surgical History  Procedure Laterality Date   Appendectomy  1966    ruptured, hospital for 3 weeks   Rhinoplasty  1976   Vein ligation and stripping Right 1998   leg   Mohs surgery  2004, 2005    basal cell of the face   Tooth extraction  03/2011?    "just one; had a root canal that got infected" (01/21/2013)   Laparotomy N/A 09/10/2012    Procedure: EXPLORATORY LAPAROTOMY, BILATERAL SALPINGO OOPHORECTOMY, TUMOR DEBULKING;  Surgeon: Imagene Gurney A. Alycia Rossetti, MD;  Location: [facility];  Service: Gynecology;  Laterality: N/A;   Exploratory laparotomy  09/10/12    Lysis of adhesions, BSO, suboptimal tumor debulking   Vena cava filter placement  08/2012    History   Social History   Marital Status: Single    Spouse Name: N/A    Number of Children: N/A   Years of Education: N/A   Social History Main Topics   Smoking status: Former Smoker -- 60 years    Types: Cigarettes    Quit date: 04/20/2012   Smokeless tobacco: Never Used   Alcohol Use: Yes     Comment: 01/21/2013 "long time ago I was a social drinker"   Drug Use: No   Sexual Activity: Not Currently   Other Topics Concern   Not on file   Social History Narrative   No narrative on file     FAMILY HISTORY:  During the visit, a 4-generation pedigree was obtained. Significant diagnoses include the following:  Family History  Problem Relation Age of Onset   Pneumonia Mother    Heart attack Father    Cancer Neg Hx    Monique [name] has a relatively small family being an only child with no children. Her father had only one cancer-free brother who is deceased and this paternal uncle did not have children. Her mother and her six maternal aunts, all of who are deceased, were all cancer free. She does not know anything about her grandparents on either side. She is not in contact with any cousins on her maternal side of the family. Monique [name] ancestry is of Caucasian descent. There is no known Jewish ancestry or consanguinity.  GENETIC COUNSELING ASSESSMENT: Monique [name] is a 68 y.o. female with a personal history of cancer suggestive of a hereditary predisposition to cancer. We, therefore, discussed  and recommended the following at today's visit.   DISCUSSION: We reviewed the characteristics, features and inheritance patterns of hereditary cancer syndromes. We also discussed genetic testing, including the appropriate family members to test, the process of testing, insurance coverage and turn-around-time for results. We discussed the implications of a negative, positive and/or variant of uncertain significant result. We recommended Monique [name] pursue genetic testing for the BRCA1 and BCRA2 genes. If this is negative, Monique [name] is interested in pursuing gene panel testing, which we feel is reasonable given her history.   PLAN: Based on our above recommendation, Monique [name] wished to pursue genetic testing and the blood sample was drawn and will be sent to [facility] for analysis. Results for BRCA1 and BCRA2 should be available within approximately 2 weeks time, at which point they will be disclosed by telephone to Monique [name], as will any additional recommendations warranted by these results. As mentioned above, if this test is negative, we will have [facility] perform gene panel testing for the OvaNext gene panel and call with those results once available. We also encouraged Monique [name] to remain in contact with cancer genetics annually so that we can continuously update the family history and inform her of any changes in cancer genetics and testing that may be of benefit for this family. Ms.  [name] questions were answered to her satisfaction today. Our contact information was provided should additional questions or concerns arise.   Thank you for the referral and allowing [place] to share in the care of your patient.   The patient was seen for a total of 40 minutes in face-to-face genetic counseling.  This patient was discussed with Marko Plume who agrees with the above.    _______________________________________________________________________ For Office Staff:  Number of people involved in session: 2 Was  an Intern/ student involved with case: not applicable

## 2013-12-26 ENCOUNTER — Other Ambulatory Visit: Payer: Self-pay

## 2013-12-26 MED ORDER — METOPROLOL SUCCINATE ER 25 MG PO TB24: 12.5000 mg | ORAL_TABLET | Freq: Three times a day (TID) | ORAL | Status: DC

## 2014-01-08 ENCOUNTER — Encounter: Payer: Self-pay | Admitting: Genetic Counselor

## 2014-01-08 ENCOUNTER — Telehealth: Payer: Self-pay | Admitting: *Deleted

## 2014-01-08 DIAGNOSIS — C569 Malignant neoplasm of unspecified ovary: Secondary | ICD-10-CM

## 2014-01-08 MED ORDER — HYOSCYAMINE SULFATE 0.125 MG PO TABS: ORAL_TABLET | ORAL | Status: DC

## 2014-01-08 NOTE — Progress Notes (Signed)
Monique [name] recently had cancer genetic counseling at [facility] on Spetember 2, 2015. At that time, it was recommended she pursue genetic testing. Her BRCA1 and BRCA2 gene test, which was performed at [facility], has returned and is negative for mutations. These results were disclosed to her today. Per her request, reflex testing for the OvaNext gene panel at [facility] was initiated. Results for the gene panel should be available in 2-3 more weeks and we will contact her to discuss these results and recommendations warranted by these results.

## 2014-01-08 NOTE — Telephone Encounter (Signed)
Message copied by Christa See on Thu Jan 08, 2014 11:28 AM ------      Message from: Gordy Levan      Created: Thu Jan 08, 2014 11:18 AM      Regarding: RE: Vomiting/ Stomach spams       Ok for [name], no interactions with etoposide.      Have her hold etoposide for 1-2 days until GI symptoms resolved      thanks      ----- Message -----         From: Christa See, RN         Sent: 01/08/2014  10:39 AM           To: Gordy Levan, MD      Subject: Vomiting/ Stomach spams                                  Spoke with pt and she started oral etoposide Tuesday night & has been vomiting off and on x2 days. She's not sure if its related to chemo or "raw things she ate off of salad bar." Also states she had stomach spasms yesterday that she thinks made her vomit.            Feels much better today & has been sipping fluids without vomiting.            Said you prescribed her hyoscyamine tablets last year - they are now expired. Can we refill in case the spasms come back? Just wanted to make sure it would not interfere with oral etoposide. Thanks, Cyril Mourning                    ------

## 2014-01-08 NOTE — Telephone Encounter (Signed)
Patient called this morning with vomiting and intermittent stomach spasms as noted below. Called patient back and instructed her to hold oral etoposide for 1-2 days as noted below by Dr. Marko Plume. Also, notified patient that refill for [name] sent into her pharmacy. Patient agreeable to hold etoposide for 1-2 days and to pick up [name] as well. Instructed patient to sip on fluids often for the next 2-3 days to avoid dehydration. Told patient to call us tomorrow morning if vomiting continues today and overnight and we could get her in for IV fluids before the weekend. Pt agreeable to this and very appreciative of call.

## 2014-01-11 ENCOUNTER — Other Ambulatory Visit: Payer: Self-pay | Admitting: Oncology

## 2014-01-11 DIAGNOSIS — C569 Malignant neoplasm of unspecified ovary: Secondary | ICD-10-CM

## 2014-01-12 ENCOUNTER — Other Ambulatory Visit: Payer: Self-pay | Admitting: Interventional Cardiology

## 2014-01-14 ENCOUNTER — Telehealth: Payer: Self-pay | Admitting: Oncology

## 2014-01-14 ENCOUNTER — Ambulatory Visit (HOSPITAL_BASED_OUTPATIENT_CLINIC_OR_DEPARTMENT_OTHER): Payer: Medicare Other | Admitting: Oncology

## 2014-01-14 ENCOUNTER — Encounter: Payer: Self-pay | Admitting: Oncology

## 2014-01-14 ENCOUNTER — Ambulatory Visit (HOSPITAL_BASED_OUTPATIENT_CLINIC_OR_DEPARTMENT_OTHER): Payer: Self-pay | Admitting: Pharmacist

## 2014-01-14 ENCOUNTER — Other Ambulatory Visit (HOSPITAL_BASED_OUTPATIENT_CLINIC_OR_DEPARTMENT_OTHER): Payer: Medicare Other

## 2014-01-14 VITALS — BP 104/73 | HR 78 | Temp 97.8°F | Resp 18 | Ht 67.0 in | Wt 156.3 lb

## 2014-01-14 DIAGNOSIS — I8000 Phlebitis and thrombophlebitis of superficial vessels of unspecified lower extremity: Secondary | ICD-10-CM

## 2014-01-14 DIAGNOSIS — D649 Anemia, unspecified: Secondary | ICD-10-CM

## 2014-01-14 DIAGNOSIS — J449 Chronic obstructive pulmonary disease, unspecified: Secondary | ICD-10-CM

## 2014-01-14 DIAGNOSIS — Z23 Encounter for immunization: Secondary | ICD-10-CM

## 2014-01-14 DIAGNOSIS — C569 Malignant neoplasm of unspecified ovary: Secondary | ICD-10-CM

## 2014-01-14 DIAGNOSIS — I6340 Cerebral infarction due to embolism of unspecified cerebral artery: Secondary | ICD-10-CM

## 2014-01-14 DIAGNOSIS — Z86718 Personal history of other venous thrombosis and embolism: Secondary | ICD-10-CM

## 2014-01-14 DIAGNOSIS — I428 Other cardiomyopathies: Secondary | ICD-10-CM

## 2014-01-14 DIAGNOSIS — C779 Secondary and unspecified malignant neoplasm of lymph node, unspecified: Secondary | ICD-10-CM

## 2014-01-14 DIAGNOSIS — C50919 Malignant neoplasm of unspecified site of unspecified female breast: Secondary | ICD-10-CM

## 2014-01-14 DIAGNOSIS — C787 Secondary malignant neoplasm of liver and intrahepatic bile duct: Secondary | ICD-10-CM

## 2014-01-14 DIAGNOSIS — I6350 Cerebral infarction due to unspecified occlusion or stenosis of unspecified cerebral artery: Secondary | ICD-10-CM

## 2014-01-14 DIAGNOSIS — I808 Phlebitis and thrombophlebitis of other sites: Secondary | ICD-10-CM

## 2014-01-14 DIAGNOSIS — Z8673 Personal history of transient ischemic attack (TIA), and cerebral infarction without residual deficits: Secondary | ICD-10-CM

## 2014-01-14 LAB — COMPREHENSIVE METABOLIC PANEL (CC13)
ALBUMIN: 3 g/dL — AB (ref 3.5–5.0)
ALT: 8 U/L (ref 0–55)
ANION GAP: 7 meq/L (ref 3–11)
AST: 25 U/L (ref 5–34)
Alkaline Phosphatase: 112 U/L (ref 40–150)
BUN: 16.9 mg/dL (ref 7.0–26.0)
CO2: 25 meq/L (ref 22–29)
CREATININE: 1 mg/dL (ref 0.6–1.1)
Calcium: 9.5 mg/dL (ref 8.4–10.4)
Chloride: 102 mEq/L (ref 98–109)
GLUCOSE: 109 mg/dL (ref 70–140)
POTASSIUM: 3.6 meq/L (ref 3.5–5.1)
Sodium: 135 mEq/L — ABNORMAL LOW (ref 136–145)
Total Bilirubin: 0.5 mg/dL (ref 0.20–1.20)
Total Protein: 6.8 g/dL (ref 6.4–8.3)

## 2014-01-14 LAB — CBC WITH DIFFERENTIAL/PLATELET
BASO%: 0.9 % (ref 0.0–2.0)
Basophils Absolute: 0 10*3/uL (ref 0.0–0.1)
EOS ABS: 0 10*3/uL (ref 0.0–0.5)
EOS%: 0.9 % (ref 0.0–7.0)
HCT: 31.9 % — ABNORMAL LOW (ref 34.8–46.6)
HGB: 10.3 g/dL — ABNORMAL LOW (ref 11.6–15.9)
LYMPH#: 0.3 10*3/uL — AB (ref 0.9–3.3)
LYMPH%: 9.3 % — ABNORMAL LOW (ref 14.0–49.7)
MCH: 30.8 pg (ref 25.1–34.0)
MCHC: 32.1 g/dL (ref 31.5–36.0)
MCV: 95.9 fL (ref 79.5–101.0)
MONO#: 0.3 10*3/uL (ref 0.1–0.9)
MONO%: 8.3 % (ref 0.0–14.0)
NEUT%: 80.6 % — AB (ref 38.4–76.8)
NEUTROS ABS: 2.9 10*3/uL (ref 1.5–6.5)
Platelets: 307 10*3/uL (ref 145–400)
RBC: 3.33 10*6/uL — ABNORMAL LOW (ref 3.70–5.45)
RDW: 21.2 % — AB (ref 11.2–14.5)
WBC: 3.5 10*3/uL — ABNORMAL LOW (ref 3.9–10.3)

## 2014-01-14 LAB — PROTIME-INR
INR: 4.5 — AB (ref 2.00–3.50)
Protime: 54 Seconds — ABNORMAL HIGH (ref 10.6–13.4)

## 2014-01-14 LAB — POCT INR: INR: 4.5

## 2014-01-14 MED ORDER — INFLUENZA VAC SPLIT QUAD 0.5 ML IM SUSY
0.5000 mL | PREFILLED_SYRINGE | Freq: Once | INTRAMUSCULAR | Status: AC
  Administered 2014-01-14: 0.5 mL via INTRAMUSCULAR
  Filled 2014-01-14: qty 0.5

## 2014-01-14 NOTE — Telephone Encounter (Signed)
per pof to sch pt appt-gave pt copy of sch-CT sch by Vivien Rota CS-

## 2014-01-14 NOTE — Patient Instructions (Signed)
No coumadin today (9-23) or Thurs 9-24. No aspirin today or 9-24. We will repeat PT/INR day of CT, which hopefully will be ~ Friday 9-25 or Monday 9-28. We will let you know other coumadin instructions for weekend if labs to be on Monday.  Stop etoposide now.  Hold iron if constipation or nausea

## 2014-01-14 NOTE — Progress Notes (Signed)
OFFICE PROGRESS NOTE   01/14/2014   Physicians:P.Clayton Bibles, MD ([facility]P [facility])/ [place], Kara Pacer   INTERVAL HISTORY:   Patient is seen, alone for visit, in continuing attention to recurrent ovarian cancer, which unfortunately seems to be progressing on present oral etoposide, this used since 10-14-13, presently ~ day 8 cycle 4 today. Patient has been aware of increasing upper abdominal fullness for past 2-3 weeks, with one self limited episode of vomiting and abdominal pain on 01-08-14 which may have been related to eating raw vegetables. CA125 marker was a little more elevated 3 weeks ago and is pending again today. Last imaging was CT AP 10-10-13. She is to see gyn oncology (Dr Denman George) on 01-26-14. BRCA 1 and 2 testing was negative as of 12-24-13. Additional gene panel pending. She continues coumadin and ASA for recurrent clotting. She denies bleeding, symptoms of DVT or superficial thrombophlebitis now, and no new neurologic deficits.  Patient denies abdominal pain. She has been eating, no significant nausea and no vomiting. Bowels are moving better with less zofran.   She does not have central catheter.  She is given flu vaccine today. She has IVC filter  ONCOLOGIC HISTORY   Ovarian cancer   06/12/2012 Initial Diagnosis Ovarian cancer   06/21/2012 - 08/16/2012 Chemotherapy dose dense paclitaxel and carboplatin   09/10/2012 Surgery suboptimal debulking    Chemotherapy liposomal dox planned but EF 15%   12/02/2012 - 04/28/2013 Chemotherapy gemcitabine x 10 cycles   05/22/2013 -  Chemotherapy Alimta   Patient developed abdominal symptoms in May 2013, saw GI Aug 2013, but was unable to tolerate prep for colonoscopy. She developed LLE superficial phlebitis and swelling in Jan 2014 , with acute thrombus left common femoral vein. CT AP 05-29-2012 had extensive ascites, small pleural effusions, focal PE, masses near dome of liver and omentum,  complex mass 12.8 x 6.6 cm in pelvis, extensive pelvic DVT with probable chronic thrombus in IVC, multiple small retroperitoneal nodes, left inguinal node 1.8 x 1.6 cm and right inguinal node 2.2 x 1.5 cm. She was begun on lovenox and transitioned to Xarelto by PCP. CA 125 was 569. She saw Dr Alycia Rossetti on 06-12-12, with 15 cm fixed pelvic mass. US paracentesis by IR 06-12-12 for 2.8 liters had cytology ( [id number] - 126) positive for adenocarcinoma. Neoadjuvant dose dense carbo/taxol was given x 3 cycles 06-21-12 thru 08-16-12, with good partial response by CT 08-19-12 and CA 125 down from 1041 in Feb 2014 to 92 on 08-21-12. IVC filter placed prior to exploratory laparotomy with extensive lysis of adhesions, left ureteral lysis, and bilateral salpingo-oophorectomy by Dr Alycia Rossetti on 09-10-12, suboptimal debulking. Operative findings:diffuse carcinomatosis of the residual omentum involving the entire transverse colon. Transverse colon densely adherent to the anterior abdominal wall. No disease throughout the small bowel. Mesenteric adenopathy with lymph nodes measuring 1 cm, involving the root of the small bowel mesentery. ~5 centimeter right ovarian mass and ~ 6 cm left ovarian mass. Small bowel densely adherent to the pelvis. 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 Chemotherapy resumed with dose dense taxol carboplatin on 09-27-12, completing cycle 5 on 11-08-12. CA 125 increased from 92 in April to 130 post  op in June, then 225 late July and repeat 318. CT CAP done 11-19-12 showed increase in nodular thickening along falciform ligament and small peritoneal nodule right iliac fossa. Plan was to change in  chemotherapy to doxil with avastin, however echocardiogram 11-27-12 unexpectedly had EF of 15%. She received one cycle of gemzar on 12-02-12, given peripherally, then had embolic CVAs on 08-11-35 while still on xarelto, with bleeding into the embolic CVA , and treated with heparin transitioned to coumadin. She was stable to resume gemzar with cycle 2 at 800 mg/m2 given on 12-30-12,then platelets down to 70k by day 8. She had progressive superficial thrombophlebitis LE and was changed to full dose lovenox then; there was difficulty getting lovenox covered by insurance. Cycle 3 gemzar 01-13-13 was dose reduced to 600 mg/m2, however platelets still dropped to 90k; she had recurrent embolic CVAs on 12-25-38 and was hospitalized thru 01-28-13, treated with continuous heparin transitioned back to coumadin, plus baby ASA daily. Gemzar was subsequently tolerated well at 500 mg/m2, without significant thrombocytopenia, total 10 cycles given thru 04-28-13. She progressed in liver and nodes by CT 05-13-13. She wanted to continue treatment, with Alimta chosen due to comorbidities, begun 05-22-13. Platelets were 43k by day 9 cycle 1; neulasta was given day 8. Cycle 2 Alimta was dose reduced by 25% due to platelet nadir. CA 125 improved from >3000 at start of Alimta to 205 in late March prior to cycle 4; marker began increasing with cycle 6, up to 1214 by 10-06-13. Cycle 6 Alimta was 09-19-13. Repeat CT AP 10-10-13 confirmed progression in liver, mesenteric nodules and pelvis, with new small area in spleen and 2 mm nodule right pleura. She began oral etoposide on 10-14-13.   Review of systems as above, also: No LE swelling. No bleeding. No increased SOB, no chest pain. Remainder of 10 point Review of Systems negative.  Objective:  Vital signs in last 24 hours:  BP 104/73  Pulse 78  Temp(Src) 97.8 F (36.6 C) (Oral)  Resp 18  Ht _0  (1.702 m)  Wt 156 lb 5 oz (70.903 kg)  BMI 24.48 kg/m2 Weight is up 3 lbs Alert, oriented and  appropriate. Ambulatory without assistance. She looks somewhat tired but NAD. Respirations not labored RA. Speech mostly fluent. Alopecia  HEENT:PERRL, sclerae not icteric. Oral mucosa moist without lesions, posterior pharynx clear.  Neck supple. No JVD.  Lymphatics:no cervical,supraclavicular or inguinal adenopathy Resp: clear to auscultation bilaterally and normal percussion bilaterally Cardio: regular rate and rhythm. No gallop. GI: soft, nontender, clearly more distended mostly RUQ to midline, not clear fluid wave. Normal bowel sounds. Surgical incision not remarkable. Musculoskeletal/ Extremities: without pitting edema, cords, tenderness. Prominent varicose veins LLE >RLE as baseline.  Neuro: CN intact. Speech as noted. Moves extremities equally. Seems a little slow comprehending conversation at times. Skin without rash, ecchymosis, petechiae   Lab Results:  Results for orders placed in visit on 01/14/14  CBC WITH DIFFERENTIAL      Result Value Ref Range   WBC 3.5 (*) 3.9 - 10.3 10e3/uL   NEUT# 2.9  1.5 - 6.5 10e3/uL   HGB 10.3 (*) 11.6 - 15.9 g/dL   HCT 31.9 (*) 34.8 - 46.6 %   Platelets 307  145 - 400 10e3/uL   MCV 95.9  79.5 - 101.0 fL   MCH 30.8  25.1 - 34.0 pg   MCHC 32.1  31.5 - 36.0 g/dL   RBC 3.33 (*) 3.70 - 5.45 10e6/uL   RDW 21.2 (*)  11.2 - 14.5 %   lymph# 0.3 (*) 0.9 - 3.3 10e3/uL   MONO# 0.3  0.1 - 0.9 10e3/uL   Eosinophils Absolute 0.0  0.0 - 0.5 10e3/uL   Basophils Absolute 0.0  0.0 - 0.1 10e3/uL   NEUT% 80.6 (*) 38.4 - 76.8 %   LYMPH% 9.3 (*) 14.0 - 49.7 %   MONO% 8.3  0.0 - 14.0 %   EOS% 0.9  0.0 - 7.0 %   BASO% 0.9  0.0 - 2.0 %  PROTIME-INR      Result Value Ref Range   Protime 54.0 (*) 10.6 - 13.4 Seconds   INR 4.50 (*) 2.00 - 3.50   Lovenox No     CMET available after visit normal with exception of Na 135 and alb 3.0, including LFTs still normal  CA125 (old and new methods) still pending at time of this transcription.  Studies/Results:  No  results found. CT AP ordered  Medications: I have reviewed the patient's current medications. I have asked her to hold coumadin and ASA today and 9-24 (has already taken ASA today). Will need to recheck INR 9-25 to be sure back into mid to low therapeutic range prior to resuming coumadin, and expect will need to dose reduce coumadin to <= 2.5 mg when resumed, as I am concerned that she may have significant progression of metastatic disease in liver even with chemistries as above. She will stop etoposide now.  DISCUSSION: discussed coumadin with [facility] pharmacist after visit, plan as above and will need very close follow up. Patient understands that likely the cancer is progressing despite etoposide now. We will discuss options at visit 01-23-14, after CT.  Assessment/Plan:  1. 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 Stop etoposide. Repeat CT AP.  She is not candidate for doxil due to low EF, not candidate for PARP inhibitor with negative BRCA, not eligible for upcoming nivolmab study due to # of prior regimens. She has not had topotecan, oral cytoxan or tamoxifen/ aromatase inhibitor. Low EF contraindicates CDDP. I do not think avastin is safe given her previous intracranial bleeds and anticoagulation needs.  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 IVC filter in and on coumadin + ASA. INR supratherapeutic today, plan as above. 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 She is not active but otherwise not usually symptomatic. This problem also limits choices of chemotherapy drugs.  4.DNR patient's request but still treating in attempt to control disease. HCPOA is friend Clair Gulling.  5.long past tobacco with COPD  6.anemia: improved today, continuing oral iron   She will call prior to next scheduled visit if more uncomfortable, as  could have paracentesis if significant ascites. Time spent 35 min including >50% counseling and coordination of care.    Draysen Weygandt P, MD   01/14/2014, 9:26 AM

## 2014-01-14 NOTE — Patient Instructions (Signed)
Per Dr. Marko Plume Instructions: INR above goal Hold coumadin today and tomorrow. Hold Aspirin today and tomorrow as well Return to clinic on Friday 01/16/14 lab at 11am and coumadin clinic at 11:15 am for Coumadin clinic.

## 2014-01-14 NOTE — Progress Notes (Signed)
INR above goal today at 4.5 Pt seen for follow up by Dr. Marko Plume Instructions for anticoagulation provided by Dr. Bella Kennedy to patient over the phone this afternoon Pt reports no changes in appetite or diet No medication changes No missed or extra doses No unusual bleeding or bruising Plan per Dr. Marko Plume: Hold coumadin today and tomorrow.  Return to clinic on Friday 01/16/14 lab at 11am and coumadin clinic at 11:15 am for Coumadin clinic. Pt has CT scan on Monday along with lab and coumadin clinic scheduled as well

## 2014-01-16 ENCOUNTER — Other Ambulatory Visit (HOSPITAL_BASED_OUTPATIENT_CLINIC_OR_DEPARTMENT_OTHER): Payer: Medicare Other

## 2014-01-16 ENCOUNTER — Ambulatory Visit (HOSPITAL_BASED_OUTPATIENT_CLINIC_OR_DEPARTMENT_OTHER): Payer: Medicare Other | Admitting: Pharmacist

## 2014-01-16 ENCOUNTER — Telehealth: Payer: Self-pay

## 2014-01-16 DIAGNOSIS — I8000 Phlebitis and thrombophlebitis of superficial vessels of unspecified lower extremity: Secondary | ICD-10-CM

## 2014-01-16 DIAGNOSIS — Z86718 Personal history of other venous thrombosis and embolism: Secondary | ICD-10-CM

## 2014-01-16 DIAGNOSIS — I639 Cerebral infarction, unspecified: Secondary | ICD-10-CM

## 2014-01-16 LAB — POCT INR: INR: 3

## 2014-01-16 LAB — PROTIME-INR
INR: 3 (ref 2.00–3.50)
Protime: 36 Seconds — ABNORMAL HIGH (ref 10.6–13.4)

## 2014-01-16 NOTE — Telephone Encounter (Signed)
Ms. Claudio called to see if Dr. Marko Plume would order a CT of her chest to be done with her CT A/P on 01-19-14.  Ms. Liao stated that on her last CT A/P a 2 mm nodule in the right pleura was noted. Told Ms. Ackerley that this information would be related to Dr. Marko Plume.

## 2014-01-16 NOTE — Progress Notes (Signed)
INR = 3 after holding Coumadin doses on 9/23 & 9/24 for supratherapeutic INR. No bleeding.  No bruising. Med change: off PO Etoposide due to progressive disease.  She is off ASA at present until INR more manageable. INR at goal today.  I will have pt take Coumadin 2.5 mg daily until Mon (9/28).  I discussed w/ Dr. Marko Plume & she is in agreement w/ this plan. We could consider decreasing her Coumadin to 2.5 mg daily except 5 mg on Tu/Th next week if her INR is within range Monday. She is due for scan 9/28 & plans for further tx to be discussed 10/2 w/ Dr. Marko Plume. Kennith Center, Pharm.D., CPP 01/16/2014@11 :47 AM

## 2014-01-16 NOTE — Telephone Encounter (Signed)
Left a message for Monique [name] in her home vm that Dr. Marko Plume feels that she does not need a CT of the chest to evaluate the nodule.  The lung bases can be seen on the CT A/P and the additional information would not affect the treatment plan. She is concerned about the disease status in the abdomen with her current symptoms.

## 2014-01-18 ENCOUNTER — Other Ambulatory Visit: Payer: Self-pay | Admitting: Oncology

## 2014-01-18 DIAGNOSIS — Z86718 Personal history of other venous thrombosis and embolism: Secondary | ICD-10-CM

## 2014-01-18 DIAGNOSIS — C569 Malignant neoplasm of unspecified ovary: Secondary | ICD-10-CM

## 2014-01-19 ENCOUNTER — Other Ambulatory Visit (HOSPITAL_BASED_OUTPATIENT_CLINIC_OR_DEPARTMENT_OTHER): Payer: Medicare Other

## 2014-01-19 ENCOUNTER — Ambulatory Visit (HOSPITAL_BASED_OUTPATIENT_CLINIC_OR_DEPARTMENT_OTHER): Payer: Self-pay | Admitting: Pharmacist

## 2014-01-19 ENCOUNTER — Telehealth: Payer: Self-pay | Admitting: *Deleted

## 2014-01-19 ENCOUNTER — Ambulatory Visit (HOSPITAL_COMMUNITY)
Admission: RE | Admit: 2014-01-19 | Discharge: 2014-01-19 | Disposition: A | Discharge status: Home / Self Care | Payer: Medicare Other | Source: Ambulatory Visit | Attending: Diagnostic Radiology | Admitting: Diagnostic Radiology

## 2014-01-19 DIAGNOSIS — K668 Other specified disorders of peritoneum: Secondary | ICD-10-CM | POA: Diagnosis not present

## 2014-01-19 DIAGNOSIS — R1909 Other intra-abdominal and pelvic swelling, mass and lump: Secondary | ICD-10-CM | POA: Diagnosis not present

## 2014-01-19 DIAGNOSIS — N1330 Unspecified hydronephrosis: Secondary | ICD-10-CM | POA: Insufficient documentation

## 2014-01-19 DIAGNOSIS — C7800 Secondary malignant neoplasm of unspecified lung: Secondary | ICD-10-CM | POA: Insufficient documentation

## 2014-01-19 DIAGNOSIS — Z86718 Personal history of other venous thrombosis and embolism: Secondary | ICD-10-CM

## 2014-01-19 DIAGNOSIS — R188 Other ascites: Secondary | ICD-10-CM | POA: Diagnosis not present

## 2014-01-19 DIAGNOSIS — C787 Secondary malignant neoplasm of liver and intrahepatic bile duct: Secondary | ICD-10-CM | POA: Diagnosis not present

## 2014-01-19 DIAGNOSIS — C569 Malignant neoplasm of unspecified ovary: Secondary | ICD-10-CM | POA: Insufficient documentation

## 2014-01-19 DIAGNOSIS — I6350 Cerebral infarction due to unspecified occlusion or stenosis of unspecified cerebral artery: Secondary | ICD-10-CM

## 2014-01-19 DIAGNOSIS — J984 Other disorders of lung: Secondary | ICD-10-CM | POA: Diagnosis not present

## 2014-01-19 DIAGNOSIS — I639 Cerebral infarction, unspecified: Secondary | ICD-10-CM

## 2014-01-19 DIAGNOSIS — I8000 Phlebitis and thrombophlebitis of superficial vessels of unspecified lower extremity: Secondary | ICD-10-CM

## 2014-01-19 LAB — POCT INR: INR: 3.2

## 2014-01-19 LAB — PROTIME-INR
INR: 3.2 (ref 2.00–3.50)
Protime: 38.4 Seconds — ABNORMAL HIGH (ref 10.6–13.4)

## 2014-01-19 MED ORDER — IOHEXOL 300 MG/ML  SOLN
100.0000 mL | Freq: Once | INTRAMUSCULAR | Status: AC | PRN
  Administered 2014-01-19: 100 mL via INTRAVENOUS

## 2014-01-19 MED ORDER — IOHEXOL 300 MG/ML  SOLN
50.0000 mL | Freq: Once | INTRAMUSCULAR | Status: AC | PRN
  Administered 2014-01-19: 50 mL via ORAL

## 2014-01-19 NOTE — Telephone Encounter (Signed)
RECEIVED CALL AND FAXED REPORT. THIS REPORT WAS GIVEN TO DR.LIVESAY.

## 2014-01-19 NOTE — Progress Notes (Signed)
INR = 3.2 on Coumadin 2.5 mg daily since Friday. Pt had n/v on Saturday.  She has poor PO intake this weekend.  She ate yogurt & drank a little lemonade. She has no n/v today.  She states she is "weak & tired". No bleeding. Scan today & seeing Dr. Marko Plume this Ludwig Clarks (10/2). INR elevated despite Coumadin dose decrease to 2.5 mg daily over the weekend.  This is likely due to n/v & decreased PO intake (low albumin). I will further reduce her Coumadin to 1.25 mg daily starting today. Recheck INR Fri (10/2) w/ MD visit same day. Samples: Coumadin 2.5 mg x 10 tabs (lot #[id number]; exp 10/2014) Kennith Center, Pharm.D., CPP 01/19/2014@12 :27 PM

## 2014-01-21 ENCOUNTER — Telehealth: Payer: Self-pay

## 2014-01-21 ENCOUNTER — Other Ambulatory Visit: Payer: Self-pay | Admitting: Oncology

## 2014-01-21 ENCOUNTER — Ambulatory Visit (HOSPITAL_BASED_OUTPATIENT_CLINIC_OR_DEPARTMENT_OTHER): Payer: Medicare Other

## 2014-01-21 ENCOUNTER — Other Ambulatory Visit (HOSPITAL_BASED_OUTPATIENT_CLINIC_OR_DEPARTMENT_OTHER): Payer: Medicare Other

## 2014-01-21 DIAGNOSIS — Z86718 Personal history of other venous thrombosis and embolism: Secondary | ICD-10-CM

## 2014-01-21 DIAGNOSIS — C569 Malignant neoplasm of unspecified ovary: Secondary | ICD-10-CM

## 2014-01-21 DIAGNOSIS — I8000 Phlebitis and thrombophlebitis of superficial vessels of unspecified lower extremity: Secondary | ICD-10-CM

## 2014-01-21 LAB — PROTIME-INR
INR: 3.6 — AB (ref 2.00–3.50)
Protime: 43.2 Seconds — ABNORMAL HIGH (ref 10.6–13.4)

## 2014-01-21 MED ORDER — SODIUM CHLORIDE 0.9 % IV SOLN
1000.0000 mL | Freq: Once | INTRAVENOUS | Status: AC
  Administered 2014-01-21: 1000 mL via INTRAVENOUS

## 2014-01-21 MED ORDER — FAMOTIDINE IN NACL 20-0.9 MG/50ML-% IV SOLN
20.0000 mg | Freq: Once | INTRAVENOUS | Status: AC
  Administered 2014-01-21: 20 mg via INTRAVENOUS

## 2014-01-21 MED ORDER — ONDANSETRON 8 MG/50ML IVPB (CHCC)
8.0000 mg | Freq: Once | INTRAVENOUS | Status: AC
  Administered 2014-01-21: 8 mg via INTRAVENOUS

## 2014-01-21 MED ORDER — FAMOTIDINE IN NACL 20-0.9 MG/50ML-% IV SOLN
INTRAVENOUS | Status: AC
  Filled 2014-01-21: qty 50

## 2014-01-21 MED ORDER — ONDANSETRON 8 MG/NS 50 ML IVPB
INTRAVENOUS | Status: AC
  Filled 2014-01-21: qty 8

## 2014-01-21 NOTE — Progress Notes (Signed)
1500 VSS. Patient with no complaints. Discharged with spouse in w/c. Denies nausea or lightheadedness. AVS provided.

## 2014-01-21 NOTE — Telephone Encounter (Signed)
Spoke with Monique [name] in the infusion room and told her that her INR today was 3.6.  Dr. Marko Plume wants her to hold the coumadin today and tomorrow 01-22-14 and the INR will be repeated 01-23-14 with her lab work prior to visit with Dr. Marko Plume    01-23-14.  Monique. Riles verbalized understanding.

## 2014-01-21 NOTE — Patient Instructions (Signed)
Dehydration, Adult Dehydration is when you lose more fluids from the body than you take in. Vital organs like the kidneys, brain, and heart cannot function without a proper amount of fluids and salt. Any loss of fluids from the body can cause dehydration.  CAUSES   Vomiting.  Diarrhea.  Excessive sweating.  Excessive urine output.  Fever. SYMPTOMS  Mild dehydration  Thirst.  Dry lips.  Slightly dry mouth. Moderate dehydration  Very dry mouth.  Sunken eyes.  Skin does not bounce back quickly when lightly pinched and released.  Dark urine and decreased urine production.  Decreased tear production.  Headache. Severe dehydration  Very dry mouth.  Extreme thirst.  Rapid, weak pulse (more than 100 beats per minute at rest).  Cold hands and feet.  Not able to sweat in spite of heat and temperature.  Rapid breathing.  Blue lips.  Confusion and lethargy.  Difficulty being awakened.  Minimal urine production.  No tears. DIAGNOSIS  Your caregiver will diagnose dehydration based on your symptoms and your exam. Blood and urine tests will help confirm the diagnosis. The diagnostic evaluation should also identify the cause of dehydration. TREATMENT  Treatment of mild or moderate dehydration can often be done at home by increasing the amount of fluids that you drink. It is best to drink small amounts of fluid more often. Drinking too much at one time can make vomiting worse. Refer to the home care instructions below. Severe dehydration needs to be treated at the hospital where you will probably be given intravenous (IV) fluids that contain water and electrolytes. HOME CARE INSTRUCTIONS   Ask your caregiver about specific rehydration instructions.  Drink enough fluids to keep your urine clear or pale yellow.  Drink small amounts frequently if you have nausea and vomiting.  Eat as you normally do.  Avoid:  Foods or drinks high in sugar.  Carbonated  drinks.  Juice.  Extremely hot or cold fluids.  Drinks with caffeine.  Fatty, greasy foods.  Alcohol.  Tobacco.  Overeating.  Gelatin desserts.  Wash your hands well to avoid spreading bacteria and viruses.  Only take over-the-counter or prescription medicines for pain, discomfort, or fever as directed by your caregiver.  Ask your caregiver if you should continue all prescribed and over-the-counter medicines.  Keep all follow-up appointments with your caregiver. SEEK MEDICAL CARE IF:  You have abdominal pain and it increases or stays in one area (localizes).  You have a rash, stiff neck, or severe headache.  You are irritable, sleepy, or difficult to awaken.  You are weak, dizzy, or extremely thirsty. SEEK IMMEDIATE MEDICAL CARE IF:   You are unable to keep fluids down or you get worse despite treatment.  You have frequent episodes of vomiting or diarrhea.  You have blood or green matter (bile) in your vomit.  You have blood in your stool or your stool looks black and tarry.  You have not urinated in 6 to 8 hours, or you have only urinated a small amount of very dark urine.  You have a fever.  You faint. MAKE SURE YOU:   Understand these instructions.  Will watch your condition.  Will get help right away if you are not doing well or get worse. Document Released: 04/10/2005 Document Revised: 07/03/2011 Document Reviewed: 11/28/2010 ExitCare Patient Information 2015 [employer]. This information is not intended to replace advice given to you by your health care provider. Make sure you discuss any questions you have with your health care   provider.   Ondansetron injection What is this medicine? ONDANSETRON (on DAN se tron) is used to treat nausea and vomiting caused by chemotherapy. It is also used to prevent or treat nausea and vomiting after surgery. This medicine may be used for other purposes; ask your health care provider or pharmacist if you have  questions. COMMON BRAND NAME(S): Zofran What should I tell my health care provider before I take this medicine? They need to know if you have any of these conditions: -heart disease -history of irregular heartbeat -liver disease -low levels of magnesium or potassium in the blood -an unusual or allergic reaction to ondansetron, granisetron, other medicines, foods, dyes, or preservatives -pregnant or trying to get pregnant -breast-feeding How should I use this medicine? This medicine is for infusion into a vein. It is given by a health care professional in a hospital or clinic setting. Talk to your pediatrician regarding the use of this medicine in children. Special care may be needed. Overdosage: If you think you have taken too much of this medicine contact a poison control center or emergency room at once. NOTE: This medicine is only for you. Do not share this medicine with others. What if I miss a dose? This does not apply. What may interact with this medicine? Do not take this medicine with any of the following medications: -apomorphine -certain medicines for fungal infections like fluconazole, itraconazole, ketoconazole, posaconazole, voriconazole -cisapride -dofetilide -dronedarone -pimozide -thioridazine -ziprasidone This medicine may also interact with the following medications: -carbamazepine -certain medicines for depression, anxiety, or psychotic disturbances -fentanyl -linezolid -MAOIs like Carbex, Eldepryl, Marplan, Nardil, and Parnate -methylene blue (injected into a vein) -other medicines that prolong the QT interval (cause an abnormal heart rhythm) -phenytoin -rifampicin -tramadol This list may not describe all possible interactions. Give your health care provider a list of all the medicines, herbs, non-prescription drugs, or dietary supplements you use. Also tell them if you smoke, drink alcohol, or use illegal drugs. Some items may interact with your  medicine. What should I watch for while using this medicine? Your condition will be monitored carefully while you are receiving this medicine. What side effects may I notice from receiving this medicine? Side effects that you should report to your doctor or health care professional as soon as possible: -allergic reactions like skin rash, itching or hives, swelling of the face, lips, or tongue -breathing problems -confusion -dizziness -fast or irregular heartbeat -feeling faint or lightheaded, falls -fever and chills -loss of balance or coordination -seizures -sweating -swelling of the hands and feet -tightness in the chest -tremors -unusually weak or tired Side effects that usually do not require medical attention (report to your doctor or health care professional if they continue or are bothersome): -constipation or diarrhea -headache This list may not describe all possible side effects. Call your doctor for medical advice about side effects. You may report side effects to FDA at 1-800-FDA-1088. Where should I keep my medicine? This drug is given in a hospital or clinic and will not be stored at home. NOTE: This sheet is a summary. It may not cover all possible information. If you have questions about this medicine, talk to your doctor, pharmacist, or health care provider.  2015, Elsevier/Gold Standard. (2013-01-15 16:18:28)  Famotidine injection What is this medicine? FAMOTIDINE (fa MOE ti deen) is a type of antihistamine that blocks the release of stomach acid. It is used to treat stomach or intestinal ulcers. It can relieve ulcer pain and discomfort, and the heartburn  from acid reflux. This medicine may be used for other purposes; ask your health care provider or pharmacist if you have questions. COMMON BRAND NAME(S): Pepcid What should I tell my health care provider before I take this medicine? They need to know if you have any of these conditions: -kidney or liver  disease -an unusual or allergic reaction to famotidine, other medicines, foods, dyes, or preservatives -pregnant or trying to get pregnant -breast-feeding How should I use this medicine? This medicine is for infusion into a vein. It is given by a health care professional in a hospital or clinic setting. Talk to your pediatrician regarding the use of this medicine in children. Special care may be needed. Overdosage: If you think you have taken too much of this medicine contact a poison control center or emergency room at once. NOTE: This medicine is only for you. Do not share this medicine with others. What if I miss a dose? This does not apply. What may interact with this medicine? -delavirdine -itraconazole -ketoconazole This list may not describe all possible interactions. Give your health care provider a list of all the medicines, herbs, non-prescription drugs, or dietary supplements you use. Also tell them if you smoke, drink alcohol, or use illegal drugs. Some items may interact with your medicine. What should I watch for while using this medicine? Tell your doctor or health care professional if your condition does not start to get better or gets worse. Do not take with aspirin, ibuprofen, or other antiinflammatory medicines. These can aggravate your condition. Do not smoke cigarettes or drink alcohol. These increase irritation in your stomach and can increase the time it will take for ulcers to heal. Cigarettes and alcohol can also worsen acid reflux or heartburn. If you get black, tarry stools or vomit up what looks like coffee grounds, call your doctor or health care professional at once. You may have a bleeding ulcer. What side effects may I notice from receiving this medicine? Side effects that you should report to your doctor or health care professional as soon as possible: -allergic reactions like skin rash, itching or hives, swelling of the face, lips, or tongue -agitation,  nervousness -confusion -hallucinations Side effects that usually do not require medical attention (report to your doctor or health care professional if they continue or are bothersome): -constipation -diarrhea -dizziness -headache This list may not describe all possible side effects. Call your doctor for medical advice about side effects. You may report side effects to FDA at 1-800-FDA-1088. Where should I keep my medicine? This medicine is given in a hospital or clinic. You will not be given this medicine to store at home. NOTE: This sheet is a summary. It may not cover all possible information. If you have questions about this medicine, talk to your doctor, pharmacist, or health care provider.  2015, Elsevier/Gold Standard. (2007-08-14 13:24:51)

## 2014-01-21 NOTE — Telephone Encounter (Signed)
Monique [name] called stating that she has  had a bout of vomiting Saturday 01-17-14 and it restarted today.  She is feeling weak.  Has had some lose stools after her contrast Monday for Ct Scan. She has not taken any Zofran for nausea because it constipates her.  She has laxatives availableto take to stay on top of constipation. Told her that Dr. Marko Plume said to come in  Today for IVFand antiemetics. Pt scheduled for 1230.  Will have INR repeated as it was high recently  and coumadin resumed recently by coumadin clinic. Monique [name] verbalized understanding.

## 2014-01-23 ENCOUNTER — Telehealth: Payer: Self-pay | Admitting: Oncology

## 2014-01-23 ENCOUNTER — Ambulatory Visit: Payer: Medicare Other | Admitting: Pharmacist

## 2014-01-23 ENCOUNTER — Other Ambulatory Visit (HOSPITAL_BASED_OUTPATIENT_CLINIC_OR_DEPARTMENT_OTHER): Payer: Medicare Other

## 2014-01-23 ENCOUNTER — Ambulatory Visit (HOSPITAL_BASED_OUTPATIENT_CLINIC_OR_DEPARTMENT_OTHER): Payer: Medicare Other | Admitting: Oncology

## 2014-01-23 ENCOUNTER — Encounter: Payer: Self-pay | Admitting: Oncology

## 2014-01-23 ENCOUNTER — Ambulatory Visit (HOSPITAL_COMMUNITY)
Admission: RE | Admit: 2014-01-23 | Discharge: 2014-01-23 | Disposition: A | Discharge status: Home / Self Care | Payer: Medicare Other | Source: Ambulatory Visit | Attending: Oncology | Admitting: Oncology

## 2014-01-23 VITALS — BP 135/95 | HR 100 | Temp 97.5°F | Resp 18 | Ht 67.0 in | Wt 153.9 lb

## 2014-01-23 DIAGNOSIS — I639 Cerebral infarction, unspecified: Secondary | ICD-10-CM

## 2014-01-23 DIAGNOSIS — Z86718 Personal history of other venous thrombosis and embolism: Secondary | ICD-10-CM

## 2014-01-23 DIAGNOSIS — C569 Malignant neoplasm of unspecified ovary: Secondary | ICD-10-CM

## 2014-01-23 DIAGNOSIS — R911 Solitary pulmonary nodule: Secondary | ICD-10-CM

## 2014-01-23 DIAGNOSIS — R188 Other ascites: Secondary | ICD-10-CM | POA: Diagnosis not present

## 2014-01-23 DIAGNOSIS — C787 Secondary malignant neoplasm of liver and intrahepatic bile duct: Secondary | ICD-10-CM

## 2014-01-23 LAB — CBC WITH DIFFERENTIAL/PLATELET
BASO%: 0.2 % (ref 0.0–2.0)
BASOS ABS: 0 10*3/uL (ref 0.0–0.1)
EOS%: 0.5 % (ref 0.0–7.0)
Eosinophils Absolute: 0 10*3/uL (ref 0.0–0.5)
HCT: 35.5 % (ref 34.8–46.6)
HEMOGLOBIN: 11.7 g/dL (ref 11.6–15.9)
LYMPH%: 9.2 % — ABNORMAL LOW (ref 14.0–49.7)
MCH: 30.5 pg (ref 25.1–34.0)
MCHC: 33 g/dL (ref 31.5–36.0)
MCV: 92.7 fL (ref 79.5–101.0)
MONO#: 1 10*3/uL — ABNORMAL HIGH (ref 0.1–0.9)
MONO%: 15.6 % — AB (ref 0.0–14.0)
NEUT#: 4.6 10*3/uL (ref 1.5–6.5)
NEUT%: 74.5 % (ref 38.4–76.8)
Platelets: 249 10*3/uL (ref 145–400)
RBC: 3.83 10*6/uL (ref 3.70–5.45)
RDW: 18.9 % — ABNORMAL HIGH (ref 11.2–14.5)
WBC: 6.2 10*3/uL (ref 3.9–10.3)
lymph#: 0.6 10*3/uL — ABNORMAL LOW (ref 0.9–3.3)

## 2014-01-23 LAB — COMPREHENSIVE METABOLIC PANEL (CC13)
ALT: 17 U/L (ref 0–55)
AST: 34 U/L (ref 5–34)
Albumin: 3.2 g/dL — ABNORMAL LOW (ref 3.5–5.0)
Alkaline Phosphatase: 153 U/L — ABNORMAL HIGH (ref 40–150)
Anion Gap: 11 mEq/L (ref 3–11)
BUN: 26.2 mg/dL — AB (ref 7.0–26.0)
CO2: 25 mEq/L (ref 22–29)
Calcium: 10.3 mg/dL (ref 8.4–10.4)
Chloride: 99 mEq/L (ref 98–109)
Creatinine: 1.1 mg/dL (ref 0.6–1.1)
Glucose: 94 mg/dl (ref 70–140)
Potassium: 4.4 mEq/L (ref 3.5–5.1)
Sodium: 135 mEq/L — ABNORMAL LOW (ref 136–145)
Total Bilirubin: 0.58 mg/dL (ref 0.20–1.20)
Total Protein: 7.4 g/dL (ref 6.4–8.3)

## 2014-01-23 LAB — PROTIME-INR
INR: 2.4 (ref 2.00–3.50)
Protime: 28.8 Seconds — ABNORMAL HIGH (ref 10.6–13.4)

## 2014-01-23 NOTE — Progress Notes (Signed)
OFFICE PROGRESS NOTE   01/23/2014   Physicians:P.Clayton Bibles, MD ([facility])/ [place], Daneen Schick, Antony Contras   INTERVAL HISTORY:   Patient is seen, together with friend/ Sydnee Cabal, rapidly more symptomatic from progressive ovarian cancer. She had CT AP 01-19-14, which shows some mixed response but overall progression including some new lesions in liver, extensive ascites, multiple new nodules in lung bases, spleen not remarkable, new right hydronephrosis and hydroureter, increased size of pelvic mass.  She was given IVF and anitemetics here on 01-21-14, having had episodes of vomiting. She is more uncomfortable from tight ascites and is having some pain right lateral abdomen or flank. She is not using pain medication as she did not want to be sedated alone at home, but will be going to [facility] now. She has not taken coumadin since 9-29 due to higher INR, with no bleeding and no symptoms of new blood clots; she has been off ASA since last week.  She has IVC filter. She does not have PAC. She has had flu vaccine.  ONCOLOGIC HISTORY   Ovarian cancer   06/12/2012 Initial Diagnosis Ovarian cancer   06/21/2012 - 08/16/2012 Chemotherapy dose dense paclitaxel and carboplatin   09/10/2012 Surgery suboptimal debulking    Chemotherapy liposomal dox planned but EF 15%   12/02/2012 - 04/28/2013 Chemotherapy gemcitabine x 10 cycles   05/22/2013 -  Chemotherapy Alimta   Patient developed abdominal symptoms in May 2013, saw GI Aug 2013, but was unable to tolerate prep for colonoscopy. She developed LLE superficial phlebitis and swelling in Jan 2014 , with acute thrombus left common femoral vein. 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 She was begun on lovenox and transitioned to Xarelto by PCP. CA 125 was 569. She saw Dr Alycia Rossetti on 06-12-12, with 15 cm fixed pelvic mass. US paracentesis by IR 06-12-12 for 2.8 liters had cytology ( [id number] - 126) positive for adenocarcinoma. Neoadjuvant dose dense carbo/taxol was given x 3 cycles 06-21-12 thru 08-16-12, with good partial response by CT 08-19-12 and CA 125 down from 1041 in Feb 2014 to 92 on 08-21-12. IVC filter placed prior to exploratory laparotomy with extensive lysis of adhesions, left ureteral lysis, and bilateral salpingo-oophorectomy by Dr Alycia Rossetti on 09-10-12, suboptimal debulking. Operative findings:diffuse carcinomatosis of the residual omentum involving the entire transverse colon. Transverse colon densely adherent to the anterior abdominal wall. No disease throughout the small bowel. Mesenteric adenopathy with lymph nodes measuring 1 cm, involving the root of the small bowel mesentery. ~5 centimeter right ovarian mass and ~ 6 cm left ovarian mass. Small bowel densely adherent to the pelvis. 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 Chemotherapy resumed with dose dense taxol carboplatin on 09-27-12, completing cycle 5 on 11-08-12. CA 125 increased from 92 in April to 130 post op in June, then 225 late July and repeat 318. CT CAP done 11-19-12 showed increase in nodular thickening along falciform  ligament and small peritoneal nodule right iliac fossa. Plan was to change in chemotherapy to doxil with avastin, however echocardiogram 11-27-12 unexpectedly had EF of 15%. 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 She was stable to resume gemzar with cycle 2 at 800 mg/m2 given on 12-30-12,then platelets down to 70k by day 8. She had progressive superficial thrombophlebitis LE and was changed to full dose lovenox then; there was difficulty getting lovenox covered by insurance. 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 Gemzar was subsequently tolerated well at 500 mg/m2, without significant thrombocytopenia, total 10 cycles given thru 04-28-13. She progressed in liver and nodes by CT 05-13-13. She wanted to continue treatment, with Alimta chosen due to comorbidities, begun 05-22-13. Platelets were 43k by day 9 cycle 1; neulasta was given day 8. Cycle 2 Alimta was dose reduced by 25% due to platelet nadir. CA 125 improved from >3000 at start of Alimta to 205 in late March prior to cycle 4; marker began increasing with cycle 6, up to 1214 by 10-06-13. Cycle 6 Alimta was 09-19-13. Repeat CT AP 10-10-13 confirmed progression in liver, mesenteric nodules and pelvis, with new small area in spleen and 2 mm nodule right pleura. She began oral etoposide on 10-14-13, taken x 3.5 cycles thru 01-14-14.   Review of systems as above, also: No fever. Is more SOB, which may be in part from large ascites. No productive cough.  Remainder of 10 point Review of Systems negative.  Objective:  Vital signs in last 24 hours: 153 lb 14 oz, 135/95, 100 regular, 18 not labored at rest, 97.5  Alert, oriented and appropriate. Able to walk to [facility] without assistance. Obviously uncomfortable from ascites. Alopecia  HEENT:PERRL, sclerae not icteric. Oral mucosa without lesions.  Neck supple. No JVD.  Lymphatics:no  cervical,supraclavicular adenopathy Resp: diminished BS bases bilaterally, no wheezes or rales. Cardio: regular rate and rhythm. No gallop. GI: abdomen tightly distended, not tender. Few bowel sounds.  Musculoskeletal/ Extremities: without pitting edema, cords, tenderness. Extensive LE varicosities. Neuro: speech fluent, moves extremities equally Skin without rash including right abdomen, ecchymosis, petechiae   Lab Results:  Results for orders placed in visit on 01/23/14  CBC WITH DIFFERENTIAL      Result Value Ref Range   WBC 6.2  3.9 - 10.3 10e3/uL   NEUT# 4.6  1.5 - 6.5 10e3/uL   HGB 11.7  11.6 - 15.9 g/dL   HCT 35.5  34.8 - 46.6 %   Platelets 249  145 - 400 10e3/uL   MCV 92.7  79.5 - 101.0 fL   MCH 30.5  25.1 - 34.0 pg   MCHC 33.0  31.5 - 36.0 g/dL   RBC 3.83  3.70 - 5.45 10e6/uL   RDW 18.9 (*) 11.2 - 14.5 %   lymph# 0.6 (*) 0.9 - 3.3 10e3/uL   MONO# 1.0 (*) 0.1 - 0.9 10e3/uL   Eosinophils Absolute 0.0  0.0 - 0.5 10e3/uL   Basophils Absolute 0.0  0.0 - 0.1 10e3/uL   NEUT% 74.5  38.4 - 76.8 %   LYMPH% 9.2 (*) 14.0 - 49.7 %   MONO% 15.6 (*) 0.0 - 14.0 %  EOS% 0.5  0.0 - 7.0 %   BASO% 0.2  0.0 - 2.0 %  COMPREHENSIVE METABOLIC PANEL (BV67)      Result Value Ref Range   Sodium 135 (*) 136 - 145 mEq/L   Potassium 4.4  3.5 - 5.1 mEq/L   Chloride 99  98 - 109 mEq/L   CO2 25  22 - 29 mEq/L   Glucose 94  70 - 140 mg/dl   BUN 26.2 (*) 7.0 - 26.0 mg/dL   Creatinine 1.1  0.6 - 1.1 mg/dL   Total Bilirubin 0.58  0.20 - 1.20 mg/dL   Alkaline Phosphatase 153 (*) 40 - 150 U/L   AST 34  5 - 34 U/L   ALT 17  0 - 55 U/L   Total Protein 7.4  6.4 - 8.3 g/dL   Albumin 3.2 (*) 3.5 - 5.0 g/dL   Calcium 10.3  8.4 - 10.4 mg/dL   Anion Gap 11  3 - 11 mEq/L  PROTIME-INR      Result Value Ref Range   Protime 28.8 (*) 10.6 - 13.4 Seconds   INR 2.40  2.00 - 3.50   Lovenox No       Studies/Results: CT ABDOMEN AND PELVIS WITH CONTRAST 01-19-14 COMPARISON: 10/10/2013  FINDINGS:  Lower  chest: No pleural effusion identified. Multiple new nodules  identified within the lung bases. New nodule within the posterior  medial right lower lobe measures 1.2 cm, image 7/series 5. Left base  nodule measures 0.6 cm, image 7/series 5.  Hepatobiliary: Multifocal liver lesions are again identified.  Compared with the previous exam there has been a mixed response to  therapy. Some lesions are smaller or have resolved in the interval.  Other lesions are new. Lesion within segment 3 of the left hepatic  lobe measures 3.7 cm, image 26/series 2. Previously 2.5 cm. Within  segment 4B of the liver there is a 2.9 cm lesion, image 26/ series 2  previously 0.7 cm. Index lesion within segment 7 of the right  hepatic lobe measures 1.3 cm, image 27/series 2. Previously 2.4 cm.  Multiple stones are identified within the gallbladder. These measure  up to 8 mm. There is no biliary dilatation.  Spleen: The spleen appears normal.  Pancreas: Normal appearance of the pancreas.  Stomach/Bowel: The stomach is unremarkable. The proximal and mid  small bowel loops appear increased in caliber measuring up to 3.8 cm  and there are multiple small bowel air-fluid levels. The distal  small bowel loops appear decreased in caliber. Normal caliber of the  colon.  Adrenals/urinary tract: The adrenal glands are both normal. The left  kidney is unremarkable. There is right hydronephrosis and  hydroureter which is new from previous exam. The urinary bladder is  unremarkable.  Vascular/Lymphatic: Calcified atherosclerotic disease involves the  abdominal aorta. No aneurysm. Filter is identified within the  inferior vena cava. No retroperitoneal adenopathy identified. No  mesenteric adenopathy. New low-attenuation adenopathy within the  right external iliac lymph node chain noted. Index lymph node  measures 2.4 cm, image 80/ series 2. Previously 1.6 cm. Left  external iliac lymph node measures 1.8 cm, image 81/series 2.   Previously this measured the same.  Reproductive: Previous hysterectomy. Large mass within the left side  of pelvis measures 8.2 x 6.2 cm, image 83/series 2. Previously 6.1 x  5.8 cm. New peritoneal nodule along the ventral surface of the  descending colon measures 0.9 x 1.6 cm  Musculoskeletal: Review of the visualized  bony structures is  negative for aggressive lytic or sclerotic bone lesion. Degenerative  disc disease at L5-S1 is noted.  Other: Interval development of extensive abdominal and pelvic  ascites which appears partially loculated.  IMPRESSION:  1. Evidence of progressive pulmonary metastasis with multiple new  nodules in both lung bases.  2. Progression of peritoneal disease. The dominant mass in the left  side of pelvis has increased in size from previous exam. Interval  development of massive abdominal and pelvic ascites which is  partially loculated.  3. Mixed response with respect to the multiple liver metastasis.  Some of the previously noted liver lesions have decreased or  resolved in the interval. Others have increased in size.  4. Findings concerning for high-grade partial obstruction perhaps  due to adhesive disease or peritoneal  5. Right-sided hydronephrosis. New from previous exam.  I have reviewed PACs images of CT. Patient given copy of report.  Medications: I have reviewed the patient's current medications. I do not feel situation is appropriate to continue coumadin now; she will resume ASA.  DISCUSSION:Discussed coumadin with pharmacist. Shannan Harper she has really done remarkably well despite very complicated situation for past year, disease is much worse now.  Have told patient that I do not feel she can tolerate more chemotherapy, and I have begun Hospice discussion. Due to need for urgent paracentesis, more discussion about plan from here will need to be done. She is hopeful that she can eat and regain some strength after paracentesis; she is requesting more  chemo and is reluctant to discuss Hospice. Su Grand is quietly emotional and seems overwhelmed.  I spoke with radiology PA during procedure, large volume fluid which appears chylous.  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 She is not candidate for doxil due to low EF, not candidate for PARP inhibitor with negative BRCA, not eligible for upcoming nivolmab study due to # of prior regimens. Low EF contraindicates CDDP. I do not think avastin is safe given her previous intracranial bleeds. She likely could tolerate aromatase inhibitor, tho chance of any benefit extremely small. I believe Hospice assistance would be most helpful. Appreciate radiology working her in for paracentesis urgently now. I will discuss more by phone if needed prior to next available appointment. 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 IVC filter in. I feel risks of coumadin now outweigh benefit and will stop this. Resume ASA.   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 She is not active but otherwise not usually symptomatic. This problem also limited choices of chemotherapy drugs.  4.DNR patient's request. Chauncey Reading is friend Clair Gulling. Clair Gulling will need assistance at home as she declines, or she will need Materials engineer. 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 Right sided pain may be from this. From standpoint of renal function does not need stent. We did not discuss this at visit.   TIme spent 30 min including >50% counseling and coordination of care.    LIVESAY,LENNIS P, MD   01/23/2014, 3:30 PM

## 2014-01-23 NOTE — Progress Notes (Signed)
Monique [name] seen by Dr. Marko Plume today  Plan to stop coumadin due to poor appetite and liver involvement of disease Risks of coumadin now outweigh benefit. Pt does have IVC filter in place Pt to stay on aspirin 81 mg It has been a pleasure seeing Monique [name]

## 2014-01-23 NOTE — Patient Instructions (Signed)
Stop coumadin per Dr. Marko Plume  Continue baby aspirin

## 2014-01-23 NOTE — Procedures (Signed)
US guided therapeutic paracentesis performed yielding 6.2 liters cream colored/chylous fluid. No immediate complications.

## 2014-01-24 LAB — CA 125: CA 125: 1130 U/mL — AB (ref <35)

## 2014-01-26 ENCOUNTER — Ambulatory Visit: Payer: Medicare Other | Admitting: Gynecologic Oncology

## 2014-01-26 ENCOUNTER — Telehealth: Payer: Self-pay | Admitting: Oncology

## 2014-01-26 NOTE — Telephone Encounter (Signed)
Medical Oncology  No answer on cell or home #, however I reached Cyd Silence and patient was at his house [phone]). Per Clair Gulling, she has not done well today. Per patient, she felt much better after paracentesis, able to sleep that night, SOB much better, less right sided pain. She had vomiting and abdominal cramping last night, took zofran. She has kept down some liquids today, but vomited again this afternoon. She knows to call if symptoms persist or unable to keep down fluids.  Discussed the right hydronephrosis, which I had not told her about at time of visit (due to time constraints for paracentesis). As right sided discomfort better, do not need to do anything otherwise now. Patient asked if she needs other cardiology evaluation to see if she can tolerate cardiotoxic chemo now - No.  I told her that we cannot use those drugs with known low EF. I said again that Hospice likely will be most beneficial now given all of situation. She "doesn't want to give up" but understands that we have to be realistic and choose what will help, not just more treatment if not appropriate.  She is aware that my next available appointment is 02-06-14, tho there are other ways to assist her if needed prior.   She appreciated call. She sounded better by phone today than at office on 10-2.  Godfrey Pick, MD

## 2014-01-27 ENCOUNTER — Ambulatory Visit (HOSPITAL_BASED_OUTPATIENT_CLINIC_OR_DEPARTMENT_OTHER): Payer: Medicare Other | Admitting: Nurse Practitioner

## 2014-01-27 ENCOUNTER — Ambulatory Visit (HOSPITAL_BASED_OUTPATIENT_CLINIC_OR_DEPARTMENT_OTHER): Payer: Medicare Other

## 2014-01-27 ENCOUNTER — Encounter: Payer: Self-pay | Admitting: Genetic Counselor

## 2014-01-27 ENCOUNTER — Telehealth: Payer: Self-pay | Admitting: *Deleted

## 2014-01-27 VITALS — BP 114/70 | HR 90 | Temp 98.5°F | Resp 18

## 2014-01-27 DIAGNOSIS — R112 Nausea with vomiting, unspecified: Secondary | ICD-10-CM

## 2014-01-27 DIAGNOSIS — E860 Dehydration: Secondary | ICD-10-CM

## 2014-01-27 DIAGNOSIS — C561 Malignant neoplasm of right ovary: Secondary | ICD-10-CM

## 2014-01-27 DIAGNOSIS — C562 Malignant neoplasm of left ovary: Secondary | ICD-10-CM

## 2014-01-27 DIAGNOSIS — C569 Malignant neoplasm of unspecified ovary: Secondary | ICD-10-CM

## 2014-01-27 MED ORDER — LORAZEPAM 2 MG/ML IJ SOLN
0.5000 mg | INTRAMUSCULAR | Status: AC
  Administered 2014-01-27: 0.5 mg via INTRAVENOUS

## 2014-01-27 MED ORDER — FAMOTIDINE IN NACL 20-0.9 MG/50ML-% IV SOLN
INTRAVENOUS | Status: AC
  Filled 2014-01-27: qty 50

## 2014-01-27 MED ORDER — FAMOTIDINE IN NACL 20-0.9 MG/50ML-% IV SOLN
20.0000 mg | Freq: Once | INTRAVENOUS | Status: AC
  Administered 2014-01-27: 20 mg via INTRAVENOUS

## 2014-01-27 MED ORDER — SODIUM CHLORIDE 0.9 % IV SOLN
1000.0000 mL | Freq: Once | INTRAVENOUS | Status: AC
  Administered 2014-01-27: 1000 mL via INTRAVENOUS

## 2014-01-27 MED ORDER — ONDANSETRON 8 MG/NS 50 ML IVPB
INTRAVENOUS | Status: AC
  Filled 2014-01-27: qty 8

## 2014-01-27 MED ORDER — ONDANSETRON 8 MG/50ML IVPB (CHCC)
8.0000 mg | Freq: Once | INTRAVENOUS | Status: AC
  Administered 2014-01-27: 8 mg via INTRAVENOUS

## 2014-01-27 MED ORDER — LORAZEPAM 2 MG/ML IJ SOLN
INTRAMUSCULAR | Status: AC
  Filled 2014-01-27: qty 1

## 2014-01-27 NOTE — Telephone Encounter (Signed)
Message copied by Christa See on Tue Jan 27, 2014 11:02 AM ------      Message from: Gordy Levan      Created: Tue Jan 27, 2014 10:23 AM       1 liter NS, zofran 8 mg, pepcid 20 IV  - YES, thank you      I don't think Alimta would be good. I would strongly encourage Hospice, thank you.      ----- Message -----         From: Christa See, RN         Sent: 01/27/2014   9:38 AM           To: Gordy Levan, MD            Good Morning,            Mariaisabel called this morning asking about IVF - was up all night vomiting, very weak. Got her appt in infusion at 1:30 today. Would you like to do 1 L NS, zofran, and pepcid?            Mentioned hospice to her again & reviewed benefits of hospice - seemed slightly more open to the idea - said she will talk to Clair Gulling about it and I told her I could come see her in infusion. She said that you told her there were no more chemo options but she was wondering if going back to the alimta was an option at all?? Told her I would just ask you so she would have an answer. Thank you.            ----- Message -----         From: Gordy Levan, MD         Sent: 01/23/2014   3:45 PM           To: Baruch Merl, RN, Christa See, RN            I told her wrong date for apt with me as she was being hurried over to radiology for paracentesis today. I told her Friday 10-9, but I am out of town that day.            My appointment, which is next available, will be Friday 10-16. RN please call her on Monday 10-5, tell her correct date and see how she is.        If she wants me to talk with her more by phone before 10-16, I am glad to do that. If pain, she should take pain meds. If very SOB even after paracentesis, we can work on O2.       Hospice, of course, will be best to help with all of this if she will agree.            If she needs to be seen before 10-16 and I do not have a cancellation,  APP or C.Berniece Salines             thanks                    ------

## 2014-01-27 NOTE — Progress Notes (Signed)
[place]   Chief Complaint  Patient presents with  . Nausea    HPI: Monique [name] 68 y.o. female diagnosed with ovarian cancer.  Status post multiple chemotherapies; with the most recent being etoposide.  Currently undergoing no active treatment.  Patient has been suffering with chronic nausea/vomiting; and was in the infusion area receiving IV fluid rehydration.  She continued with multiple episodes of vomiting despite receiving both Zofran and Pepcid.  Patient was given Ativan 0.5 mg IV which did seem to help with her nausea/vomiting.   HPI  CURRENT THERAPY: No active treatment plan for patient.   ROS  Past Medical History  Diagnosis Date  . Acute venous embolism and thrombosis of unspecified deep vessels of lower extremity   . Bronchitis     several times  . Abdominal or pelvic swelling, mass or lump, unspecified site   . Phlebitis and thrombophlebitis of unspecified site   . Pelvic mass   . GERD (gastroesophageal reflux disease)   . PONV (postoperative nausea and vomiting)     "when fentanyl is used, will break out in itchy rast"  . CHF (congestive heart failure)     "just since 11/2012; they think it's from the chemo" (01/21/2013)  . 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 DVT (deep venous thrombosis) 04/2012    "BLE" (01/21/2013)  . Chronic bronchitis     "I had it several times; I used to smoke" (01/21/2013)  . Shortness of breath     "trouble taking a deep breath at times before RX started; I'm fine now" (01/21/2013)  . Anemia     "because of the chemo" (01/21/2013)  . Arthritis     "little in my right hand" (01/21/2013)  . Anxiety     "related to ovarian cancer; don't take RX for it" (01/21/2013)  . Basal cell carcinoma of face   . Ovarian cancer dx'd 2013    Past Surgical History  Procedure Laterality Date  . Appendectomy  1966    ruptured, hospital for 3 weeks  . Rhinoplasty  1976  . Vein  ligation and stripping Right 1998    leg  . Mohs surgery  2004, 2005    basal cell of the face  . Tooth extraction  03/2011?    "just one; had a root canal that got infected" (01/21/2013)  . Laparotomy N/A 09/10/2012    Procedure: EXPLORATORY LAPAROTOMY, BILATERAL SALPINGO OOPHORECTOMY, TUMOR DEBULKING;  Surgeon: Imagene Gurney A. Alycia Rossetti, MD;  Location: [facility];  Service: Gynecology;  Laterality: N/A;  . Exploratory laparotomy  09/10/12    Lysis of adhesions, BSO, suboptimal tumor debulking  . 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     is allergic to septra; ampicillin; fentanyl and related; and neosporin.    Medication List       This  list is accurate as of: 01/27/14 11:59 PM.  Always use your most recent med list.               aspirin 81 MG tablet  Take 81 mg by mouth daily.     hyoscyamine 0.125 MG tablet  Commonly known as:  [name]  Take one tablet by mouth daily as needed for stomach spasms.     lisinopril 2.5 MG tablet  Commonly known as:  PRINIVIL,ZESTRIL  Take 1 tablet (2.5 mg total) by mouth daily.     metoprolol succinate 25 MG 24 hr tablet  Commonly  known as:  TOPROL-XL  Take 0.5 tablets (12.5 mg total) by mouth 3 (three) times daily.     ondansetron 8 MG tablet  Commonly known as:  ZOFRAN  Take 1 tablet (8 mg total) by mouth every 12 (twelve) hours as needed for nausea or vomiting.     pantoprazole 40 MG tablet  Commonly known as:  PROTONIX  Take 1 tablet (40 mg total) by mouth daily.     sennosides-docusate sodium 8.6-50 MG tablet  Commonly known as:  SENOKOT-S  Take 1-2 tablets by mouth daily as needed for constipation.     spironolactone 25 MG tablet  Commonly known as:  ALDACTONE  TAKE 1 TABLET BY MOUTH EVERY DAY     triamcinolone 0.1 % paste  Commonly known as:  KENALOG  Apply to mouth ulcers bid prn         PHYSICAL EXAMINATION  Vitals: BP 114/70, HR 90, temp 98.5  Physical Exam  Nursing note and vitals reviewed. Constitutional: She is oriented to person, place, and time. Vital signs are normal. She appears malnourished and dehydrated. She appears unhealthy.  HENT:  Head: Normocephalic.  Eyes: Conjunctivae are normal. Pupils are equal, round, and reactive to light. No scleral icterus.  Neck: Normal range of motion.  Pulmonary/Chest: Effort normal.  Musculoskeletal: Normal range of motion.  Neurological: She is alert and oriented to person, place, and time.  Skin: Skin is warm and dry.  Psychiatric: Affect normal.   ASSESSMENT/PLAN:    Ovarian cancer  Assessment & Plan Etoposide was discontinued and early October 2015 due to progression of disease.  Patient is currently receiving no active treatment.  Have begun discussion regarding hospice care with patient.  Patient has a return visit scheduled for followup with Dr. Marko Plume for tomorrow 01/28/2014.   Nausea with vomiting  Assessment & Plan Patient has been suffering was fairly significant nausea and vomiting; and causing some obvious dehydration.  Patient is receiving IV fluid rehydration here at the [facility] today; and continues to actively  vomit on occasion.  Patient was given Zofran and Pepcid IV; with minimal effectiveness.  Patient was given Ativan 0.5 mg IV which did seem to help with her nausea.  Also, confirmed that patient does have Zofran ODT at home to take for her nausea.   Dehydration  Assessment & Plan Patient has been suffering with some significant nausea/vomiting; and did receive 1 L normal saline IV fluid rehydration today in the infusion Center.    Patient stated understanding of all instructions; and was in agreement with this plan of care. The patient knows to call the clinic with any problems, questions or concerns.   Review/collaboration with Dr. Marko Plume regarding all aspects of patient's visit today.   Total time spent with patient was 15 minutes;  with greater than 90 percent of that time spent in face to face counseling regarding  her symptoms, and coordination of care and follow up.  Disclaimer: This note was dictated with voice recognition software. Similar sounding words can inadvertently be transcribed and may not be corrected upon review.   Drue Second, NP 01/28/2014

## 2014-01-27 NOTE — Progress Notes (Signed)
HPI:  [name] was previously seen in the [facility] due to a personal history of cancer and concerns regarding a hereditary predisposition to cancer. Please refer to our prior cancer genetics clinic note for more information regarding [name]'s medical, social and family histories, and our assessment and recommendations, at the time. [name] recent genetic test results were disclosed to her, as were recommendations warranted by these results. These results and recommendations are discussed in more detail below.  GENETIC TEST RESULTS: At the time of [name]'s visit, we recommended she pursue genetic testing of the OvaNext gene panel. This test, which included sequencing and deletion/duplication analysis of the genes, was performed at [facility]. Genetic testing was normal, and did not reveal a deleterious mutation in these genes. A complete list of all genes tested is located on the test report scanned into EPIC.    We discussed with [name] that since the current genetic testing is not perfect, it is possible there may be a gene mutation in one of these genes that current testing cannot detect, but that chance is small.  We also discussed, that it is possible that another gene that has not yet been discovered, or that we have not yet tested, is responsible for the cancer diagnoses in the family, and it is, therefore, important to remain in touch with cancer genetics in the future so that we can continue to offer [name] the most up to date genetic testing.   CANCER SCREENING RECOMMENDATIONS: This result is reassuring and suggests that [name]'s cancer was most likely not due to an inherited predisposition associated with one of these genes.  Most cancers happen by chance and this negative test, along with details of her family history, suggests that her cancer falls into this category.  We, therefore, recommended she continue to follow the cancer management and  screening guidelines provided by her oncology and primary providers.    RECOMMENDATIONS FOR FAMILY MEMBERS:  Individuals in this family might be at some increased risk of developing cancer, over the general population risk, simply due to the family history of cancer.  We recommended women in this family have a yearly mammogram beginning at age 4, an an annual clinical breast exam, and perform monthly breast self-exams. Women in this family should also have a gynecological exam as recommended by their primary provider. All family members should have a colonoscopy by age 53.  FOLLOW-UP: Lastly, we discussed with [name] that cancer genetics is a rapidly advancing field and it is possible that new genetic tests will be appropriate for her and/or her family members in the future. We encouraged her to remain in contact with cancer genetics on an annual basis so we can update her personal and family histories and let her know of advances in cancer genetics that may benefit this family.   Our contact number was provided. [name] questions were answered to her satisfaction, and she knows she is welcome to call us at anytime with additional questions or concerns.   Catherine A. Fine, MS, CGC Certified Genetic Counseor [email or web address]

## 2014-01-27 NOTE — Patient Instructions (Signed)

## 2014-01-27 NOTE — Telephone Encounter (Signed)
Pt left VM stating she had a very difficult night - was up vomiting several times and feels very weak this morning. Scheduled appt in infusion room at 1:30 for IVF. Orders for 1 L NS, IV zofran and pepcid placed under signed and held. Called patient back and let her know time to come in for fluids. She is agreeable to come at 1:30.  Also, discussed hospice again with Ms. Ingman - she states she will discuss with Clair Gulling. I told her I would come see her in the infusion room today and see if they have reached a decision. Pt asked if Dr. Marko Plume would consider giving her alimta again. Told her I would check and let her know when I come see her today at appt. Patient very appreciative of our time and getting fluids scheduled for today.

## 2014-01-27 NOTE — Progress Notes (Signed)
Patient continues to be nauseous despite zofran and pepcid. Selena Lesser, NP notified. Ativan ordered per NP.  Patient reports nausea relief at time of discharge.

## 2014-01-28 ENCOUNTER — Encounter (HOSPITAL_COMMUNITY): Payer: Self-pay | Admitting: Emergency Medicine

## 2014-01-28 ENCOUNTER — Inpatient Hospital Stay (HOSPITAL_COMMUNITY)
Admission: EM | Admit: 2014-01-28 | Discharge: 2014-02-01 | DRG: 682 | Disposition: A | Discharge status: Hospice | Payer: Medicare Other | Attending: Internal Medicine | Admitting: Internal Medicine

## 2014-01-28 ENCOUNTER — Encounter: Payer: Self-pay | Admitting: Nurse Practitioner

## 2014-01-28 ENCOUNTER — Emergency Department (HOSPITAL_COMMUNITY): Payer: Medicare Other

## 2014-01-28 ENCOUNTER — Ambulatory Visit (HOSPITAL_BASED_OUTPATIENT_CLINIC_OR_DEPARTMENT_OTHER): Payer: Medicare Other | Admitting: Oncology

## 2014-01-28 ENCOUNTER — Other Ambulatory Visit: Payer: Self-pay | Admitting: Oncology

## 2014-01-28 ENCOUNTER — Encounter: Payer: Self-pay | Admitting: Oncology

## 2014-01-28 VITALS — BP 114/88 | HR 107 | Temp 97.4°F | Resp 20 | Ht 67.0 in | Wt 130.4 lb

## 2014-01-28 DIAGNOSIS — C787 Secondary malignant neoplasm of liver and intrahepatic bile duct: Secondary | ICD-10-CM | POA: Diagnosis present

## 2014-01-28 DIAGNOSIS — E43 Unspecified severe protein-calorie malnutrition: Secondary | ICD-10-CM | POA: Insufficient documentation

## 2014-01-28 DIAGNOSIS — T451X5A Adverse effect of antineoplastic and immunosuppressive drugs, initial encounter: Secondary | ICD-10-CM | POA: Diagnosis present

## 2014-01-28 DIAGNOSIS — R180 Malignant ascites: Secondary | ICD-10-CM | POA: Diagnosis present

## 2014-01-28 DIAGNOSIS — Z86718 Personal history of other venous thrombosis and embolism: Secondary | ICD-10-CM

## 2014-01-28 DIAGNOSIS — C569 Malignant neoplasm of unspecified ovary: Secondary | ICD-10-CM

## 2014-01-28 DIAGNOSIS — Z880 Allergy status to penicillin: Secondary | ICD-10-CM

## 2014-01-28 DIAGNOSIS — Z90722 Acquired absence of ovaries, bilateral: Secondary | ICD-10-CM | POA: Diagnosis present

## 2014-01-28 DIAGNOSIS — Z8673 Personal history of transient ischemic attack (TIA), and cerebral infarction without residual deficits: Secondary | ICD-10-CM

## 2014-01-28 DIAGNOSIS — N179 Acute kidney failure, unspecified: Secondary | ICD-10-CM | POA: Diagnosis not present

## 2014-01-28 DIAGNOSIS — IMO0002 Reserved for concepts with insufficient information to code with codable children: Secondary | ICD-10-CM

## 2014-01-28 DIAGNOSIS — R188 Other ascites: Secondary | ICD-10-CM | POA: Diagnosis present

## 2014-01-28 DIAGNOSIS — Z85828 Personal history of other malignant neoplasm of skin: Secondary | ICD-10-CM

## 2014-01-28 DIAGNOSIS — C561 Malignant neoplasm of right ovary: Secondary | ICD-10-CM | POA: Diagnosis present

## 2014-01-28 DIAGNOSIS — I5022 Chronic systolic (congestive) heart failure: Secondary | ICD-10-CM | POA: Diagnosis present

## 2014-01-28 DIAGNOSIS — E876 Hypokalemia: Secondary | ICD-10-CM

## 2014-01-28 DIAGNOSIS — Z7982 Long term (current) use of aspirin: Secondary | ICD-10-CM

## 2014-01-28 DIAGNOSIS — C562 Malignant neoplasm of left ovary: Secondary | ICD-10-CM | POA: Diagnosis present

## 2014-01-28 DIAGNOSIS — E860 Dehydration: Secondary | ICD-10-CM

## 2014-01-28 DIAGNOSIS — E872 Acidosis: Secondary | ICD-10-CM | POA: Diagnosis present

## 2014-01-28 DIAGNOSIS — D72829 Elevated white blood cell count, unspecified: Secondary | ICD-10-CM | POA: Diagnosis present

## 2014-01-28 DIAGNOSIS — R778 Other specified abnormalities of plasma proteins: Secondary | ICD-10-CM

## 2014-01-28 DIAGNOSIS — I63432 Cerebral infarction due to embolism of left posterior cerebral artery: Secondary | ICD-10-CM

## 2014-01-28 DIAGNOSIS — D6481 Anemia due to antineoplastic chemotherapy: Secondary | ICD-10-CM | POA: Diagnosis present

## 2014-01-28 DIAGNOSIS — Z9889 Other specified postprocedural states: Secondary | ICD-10-CM

## 2014-01-28 DIAGNOSIS — I8003 Phlebitis and thrombophlebitis of superficial vessels of lower extremities, bilateral: Secondary | ICD-10-CM

## 2014-01-28 DIAGNOSIS — D6859 Other primary thrombophilia: Secondary | ICD-10-CM

## 2014-01-28 DIAGNOSIS — N1330 Unspecified hydronephrosis: Secondary | ICD-10-CM

## 2014-01-28 DIAGNOSIS — R112 Nausea with vomiting, unspecified: Secondary | ICD-10-CM | POA: Diagnosis present

## 2014-01-28 DIAGNOSIS — I5023 Acute on chronic systolic (congestive) heart failure: Secondary | ICD-10-CM

## 2014-01-28 DIAGNOSIS — I4729 Other ventricular tachycardia: Secondary | ICD-10-CM

## 2014-01-28 DIAGNOSIS — Z79899 Other long term (current) drug therapy: Secondary | ICD-10-CM

## 2014-01-28 DIAGNOSIS — J449 Chronic obstructive pulmonary disease, unspecified: Secondary | ICD-10-CM | POA: Diagnosis present

## 2014-01-28 DIAGNOSIS — Z882 Allergy status to sulfonamides status: Secondary | ICD-10-CM

## 2014-01-28 DIAGNOSIS — Z6821 Body mass index (BMI) 21.0-21.9, adult: Secondary | ICD-10-CM

## 2014-01-28 DIAGNOSIS — I639 Cerebral infarction, unspecified: Secondary | ICD-10-CM

## 2014-01-28 DIAGNOSIS — K219 Gastro-esophageal reflux disease without esophagitis: Secondary | ICD-10-CM | POA: Diagnosis present

## 2014-01-28 DIAGNOSIS — I428 Other cardiomyopathies: Secondary | ICD-10-CM

## 2014-01-28 DIAGNOSIS — Z66 Do not resuscitate: Secondary | ICD-10-CM | POA: Diagnosis present

## 2014-01-28 DIAGNOSIS — R7989 Other specified abnormal findings of blood chemistry: Secondary | ICD-10-CM

## 2014-01-28 DIAGNOSIS — K311 Adult hypertrophic pyloric stenosis: Secondary | ICD-10-CM | POA: Diagnosis present

## 2014-01-28 DIAGNOSIS — Z87891 Personal history of nicotine dependence: Secondary | ICD-10-CM

## 2014-01-28 DIAGNOSIS — Z888 Allergy status to other drugs, medicaments and biological substances status: Secondary | ICD-10-CM

## 2014-01-28 DIAGNOSIS — R1110 Vomiting, unspecified: Secondary | ICD-10-CM

## 2014-01-28 DIAGNOSIS — E871 Hypo-osmolality and hyponatremia: Secondary | ICD-10-CM | POA: Diagnosis present

## 2014-01-28 DIAGNOSIS — C7800 Secondary malignant neoplasm of unspecified lung: Secondary | ICD-10-CM | POA: Diagnosis present

## 2014-01-28 DIAGNOSIS — Z515 Encounter for palliative care: Secondary | ICD-10-CM | POA: Insufficient documentation

## 2014-01-28 DIAGNOSIS — I427 Cardiomyopathy due to drug and external agent: Secondary | ICD-10-CM

## 2014-01-28 DIAGNOSIS — I472 Ventricular tachycardia: Secondary | ICD-10-CM

## 2014-01-28 DIAGNOSIS — N19 Unspecified kidney failure: Secondary | ICD-10-CM

## 2014-01-28 DIAGNOSIS — Z8249 Family history of ischemic heart disease and other diseases of the circulatory system: Secondary | ICD-10-CM

## 2014-01-28 DIAGNOSIS — Z23 Encounter for immunization: Secondary | ICD-10-CM

## 2014-01-28 LAB — COMPREHENSIVE METABOLIC PANEL
ALK PHOS: 138 U/L — AB (ref 39–117)
ALT: 12 U/L (ref 0–35)
AST: 19 U/L (ref 0–37)
Albumin: 3 g/dL — ABNORMAL LOW (ref 3.5–5.2)
Anion gap: 20 — ABNORMAL HIGH (ref 5–15)
BILIRUBIN TOTAL: 0.4 mg/dL (ref 0.3–1.2)
BUN: 64 mg/dL — ABNORMAL HIGH (ref 6–23)
CHLORIDE: 92 meq/L — AB (ref 96–112)
CO2: 18 meq/L — AB (ref 19–32)
Calcium: 10.2 mg/dL (ref 8.4–10.5)
Creatinine, Ser: 2.21 mg/dL — ABNORMAL HIGH (ref 0.50–1.10)
GFR calc non Af Amer: 22 mL/min — ABNORMAL LOW (ref >90)
GFR, EST AFRICAN AMERICAN: 26 mL/min — AB (ref >90)
GLUCOSE: 147 mg/dL — AB (ref 70–99)
POTASSIUM: 4.3 meq/L (ref 3.7–5.3)
SODIUM: 130 meq/L — AB (ref 137–147)
Total Protein: 8 g/dL (ref 6.0–8.3)

## 2014-01-28 LAB — CBC WITH DIFFERENTIAL/PLATELET
BASOS ABS: 0 10*3/uL (ref 0.0–0.1)
Basophils Relative: 0 % (ref 0–1)
Eosinophils Absolute: 0 10*3/uL (ref 0.0–0.7)
Eosinophils Relative: 0 % (ref 0–5)
HCT: 41.6 % (ref 36.0–46.0)
Hemoglobin: 14.3 g/dL (ref 12.0–15.0)
LYMPHS ABS: 1.4 10*3/uL (ref 0.7–4.0)
LYMPHS PCT: 8 % — AB (ref 12–46)
MCH: 30.6 pg (ref 26.0–34.0)
MCHC: 34.4 g/dL (ref 30.0–36.0)
MCV: 88.9 fL (ref 78.0–100.0)
Monocytes Absolute: 1.7 10*3/uL — ABNORMAL HIGH (ref 0.1–1.0)
Monocytes Relative: 10 % (ref 3–12)
NEUTROS ABS: 14.3 10*3/uL — AB (ref 1.7–7.7)
NEUTROS PCT: 82 % — AB (ref 43–77)
PLATELETS: 446 10*3/uL — AB (ref 150–400)
RBC: 4.68 MIL/uL (ref 3.87–5.11)
RDW: 18.3 % — AB (ref 11.5–15.5)
WBC: 17.4 10*3/uL — AB (ref 4.0–10.5)

## 2014-01-28 LAB — LIPASE, BLOOD: Lipase: 32 U/L (ref 11–59)

## 2014-01-28 MED ORDER — LORAZEPAM 2 MG/ML IJ SOLN: 1.0000 mg | Freq: Once | INTRAMUSCULAR | Status: DC

## 2014-01-28 MED ORDER — SODIUM CHLORIDE 0.9 % IV BOLUS (SEPSIS)
2000.0000 mL | Freq: Once | INTRAVENOUS | Status: AC
  Administered 2014-01-28: 2000 mL via INTRAVENOUS

## 2014-01-28 MED ORDER — PROMETHAZINE HCL 25 MG/ML IJ SOLN
12.5000 mg | Freq: Once | INTRAMUSCULAR | Status: AC
  Administered 2014-01-28: 12.5 mg via INTRAVENOUS
  Filled 2014-01-28: qty 1

## 2014-01-28 MED ORDER — METOCLOPRAMIDE HCL 5 MG/ML IJ SOLN
10.0000 mg | Freq: Once | INTRAMUSCULAR | Status: AC
  Administered 2014-01-28: 10 mg via INTRAVENOUS
  Filled 2014-01-28: qty 2

## 2014-01-28 MED ORDER — ONDANSETRON HCL 4 MG/2ML IJ SOLN
4.0000 mg | Freq: Once | INTRAMUSCULAR | Status: AC
  Administered 2014-01-28: 4 mg via INTRAVENOUS
  Filled 2014-01-28: qty 2

## 2014-01-28 NOTE — Progress Notes (Signed)
OFFICE PROGRESS NOTE   01/28/2014   Physicians:P.Clayton Bibles, MD (PCP [facility])/ [place], Daneen Schick, Antony Contras   INTERVAL HISTORY:  Patient is seen, together with significant other/ Waynard Reeds [phone], [address] [place]) for additional discussion of situation with extensively metastatic, progressive ovarian cancer. 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 She has had more vomiting, including large volumes and multiple episodes during last night, but has had no vomiting since 0600 by time of 1600 MD visit today. She had IVF yesterday, as well as long discussion with previous Hospice RN then. She and Clair Gulling are both in agreement with Hospice referral now, as she prefers to stay at [facility] in preference to hospital if possible. I have spoken directly with Hospice admission staff now and requested that they see her as soon as possible, which will be in AM. Patient has been using zofran on regular basis now. She dislikes ativan, did not feel this was helpful when given with IVF yesterday. We have discussed likely bowel obstruction/ partial bowel obstruction and I have encouraged sips of clear liquids only. Coumadin was DCd last week due to rest of situation She has had no symptoms of recurrent DVT, superficial thrombophlebitis or CVA.  She does not have PAC. She has IVC filter. COde status is DNR per patient's request.  ONCOLOGIC HISTORY   Ovarian cancer   06/12/2012 Initial Diagnosis Ovarian cancer   06/21/2012 - 08/16/2012 Chemotherapy dose dense paclitaxel and carboplatin   09/10/2012 Surgery suboptimal debulking    Chemotherapy liposomal dox planned but EF 15%   12/02/2012 - 04/28/2013 Chemotherapy gemcitabine x 10 cycles   05/22/2013 -  Chemotherapy Alimta   Patient developed abdominal symptoms in May 2013, saw GI Aug  2013, but was unable to tolerate prep for colonoscopy. She developed LLE superficial phlebitis and swelling in Jan 2014 , with acute thrombus left common femoral vein. 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 She was begun on lovenox and transitioned to Xarelto by PCP. CA 125 was 569. She saw Dr Alycia Rossetti on 06-12-12, with 15 cm fixed pelvic mass. US paracentesis by IR 06-12-12 for 2.8 liters had cytology ( [id number] - 126) positive for adenocarcinoma. Neoadjuvant dose dense carbo/taxol was given x 3 cycles 06-21-12 thru 08-16-12, with good partial response by CT 08-19-12 and CA 125 down from 1041 in Feb 2014 to 92 on 08-21-12. IVC filter placed prior to exploratory laparotomy with extensive lysis of adhesions, left ureteral lysis, and bilateral salpingo-oophorectomy by Dr Alycia Rossetti on 09-10-12, suboptimal debulking. Operative findings:diffuse carcinomatosis of the residual omentum involving the entire transverse colon. Transverse colon densely adherent to the anterior abdominal wall. No disease throughout the small bowel. Mesenteric adenopathy with lymph nodes measuring 1 cm, involving the root of the small bowel mesentery. ~5 centimeter right ovarian mass and ~ 6 cm left ovarian mass. Small bowel densely adherent to the pelvis. 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 Chemotherapy resumed with dose dense taxol carboplatin on 09-27-12,  completing cycle 5 on 11-08-12. CA 125 increased from 92 in April to 130 post op in June, then 225 late July and repeat 318. CT CAP done 11-19-12 showed increase in nodular thickening along falciform ligament and small peritoneal nodule right iliac fossa. Plan was to change in chemotherapy to doxil with avastin, however echocardiogram 11-27-12 unexpectedly had EF of 15%. She received one cycle of gemzar on 12-02-12, given peripherally, then had embolic CVAs on 4-00-86 while still on xarelto, with bleeding into the embolic CVA , and treated with heparin transitioned to coumadin. She was stable to resume gemzar with cycle 2 at 800 mg/m2 given on 12-30-12,then platelets down to 70k by day 8. She had progressive superficial thrombophlebitis LE and was changed to full dose lovenox then; there was difficulty getting lovenox covered by insurance. Cycle 3 gemzar 01-13-13 was dose reduced to 600 mg/m2, however platelets still dropped to 90k; she had recurrent embolic CVAs on 7-61-95 and was hospitalized thru 01-28-13, treated with continuous heparin transitioned back to coumadin, plus baby ASA daily. Gemzar was subsequently tolerated well at 500 mg/m2, without significant thrombocytopenia, total 10 cycles given thru 04-28-13. She progressed in liver and nodes by CT 05-13-13. She wanted to continue treatment, with Alimta chosen due to comorbidities, begun 05-22-13. Platelets were 43k by day 9 cycle 1; neulasta was given day 8. Cycle 2 Alimta was dose reduced by 25% due to platelet nadir. CA 125 improved from >3000 at start of Alimta to 205 in late March prior to cycle 4; marker began increasing with cycle 6, up to 1214 by 10-06-13. Cycle 6 Alimta was 09-19-13. Repeat CT AP 10-10-13 confirmed progression in liver, mesenteric nodules and pelvis, with new small area in spleen and 2 mm nodule right pleura. She began oral etoposide on 10-14-13, taken x 3.5 cycles thru 01-14-14 when this was [facility] due to rapidly progressive disease. Coumadin was  stopped early October also due to progression of disease including liver and deterioration in overall status.   Review of systems as above, also: No new or different neurologic symptoms. No significant cough. No BM today. No swelling LE. Remainder of 10 point Review of Systems negative.  Objective:  Vital signs in last 24 hours:  BP 114/88  Pulse 107  Temp(Src) 97.4 F (36.3 C) (Oral)  Resp 20  Ht 5\' 7"  (1.702 m)  Wt 130 lb 6.4 oz (59.149 kg)  BMI 20.42 kg/m2 Weight is down 23 lbs from recorded weight 01-23-14, including >6 liters ascites. Alert, oriented and appropriate, looks weak and uncomfortable but NAD, in wheelchair. Su Grand very supportive. Alopecia  HEENT:PERRL, sclerae not icteric. Oral mucosa moist without lesions, posterior pharynx clear.  Neck supple. No JVD.  Lymphatics:no supraclavicula adenopathy Resp: clear to auscultation bilaterally  Cardio: tachy, regular rate and rhythm. No gallop. GI: soft, not tender to gentle exam, distended but not as much as prior to paracentesis. Occasional bowel sounds.  Musculoskeletal/ Extremities: without pitting edema, cords, tenderness. No concerning LE varicose veins now Neuro: speech fluent, moves all extremities Skin without rash, ecchymosis, petechiae.   Lab Results: No labs repeated today, these from 01-23-14 Results for orders placed in visit on 01/23/14  CBC WITH DIFFERENTIAL      Result Value Ref Range   WBC 6.2  3.9 - 10.3 10e3/uL  NEUT# 4.6  1.5 - 6.5 10e3/uL   HGB 11.7  11.6 - 15.9 g/dL   HCT 35.5  34.8 - 46.6 %   Platelets 249  145 - 400 10e3/uL   MCV 92.7  79.5 - 101.0 fL   MCH 30.5  25.1 - 34.0 pg   MCHC 33.0  31.5 - 36.0 g/dL   RBC 3.83  3.70 - 5.45 10e6/uL   RDW 18.9 (*) 11.2 - 14.5 %   lymph# 0.6 (*) 0.9 - 3.3 10e3/uL   MONO# 1.0 (*) 0.1 - 0.9 10e3/uL   Eosinophils Absolute 0.0  0.0 - 0.5 10e3/uL   Basophils Absolute 0.0  0.0 - 0.1 10e3/uL   NEUT% 74.5  38.4 - 76.8 %   LYMPH% 9.2 (*) 14.0 - 49.7  %   MONO% 15.6 (*) 0.0 - 14.0 %   EOS% 0.5  0.0 - 7.0 %   BASO% 0.2  0.0 - 2.0 %  COMPREHENSIVE METABOLIC PANEL (FA21)      Result Value Ref Range   Sodium 135 (*) 136 - 145 mEq/L   Potassium 4.4  3.5 - 5.1 mEq/L   Chloride 99  98 - 109 mEq/L   CO2 25  22 - 29 mEq/L   Glucose 94  70 - 140 mg/dl   BUN 26.2 (*) 7.0 - 26.0 mg/dL   Creatinine 1.1  0.6 - 1.1 mg/dL   Total Bilirubin 0.58  0.20 - 1.20 mg/dL   Alkaline Phosphatase 153 (*) 40 - 150 U/L   AST 34  5 - 34 U/L   ALT 17  0 - 55 U/L   Total Protein 7.4  6.4 - 8.3 g/dL   Albumin 3.2 (*) 3.5 - 5.0 g/dL   Calcium 10.3  8.4 - 10.4 mg/dL   Anion Gap 11  3 - 11 mEq/L  PROTIME-INR      Result Value Ref Range   Protime 28.8 (*) 10.6 - 13.4 Seconds   INR 2.40  2.00 - 3.50   Lovenox No    CA 125      Result Value Ref Range   CA 125 1130 (*) <35 U/mL     Studies/Results:  No results found.  Medications: I have reviewed the patient's current medications. We will be back in touch to let her know to stop aldactone  DISCUSSION: She understands that she is not appropriate for any further chemo or systemic treatment, but that we will try to keep her as comfortable as possible. She prefers to try to remain out of hospital if symptoms can be managed including with Hospice involvement which we expect available tomorrow. She and Clair Gulling understand that she can go into hospital at any point if not able to manage/ tolerate at home. [facility] might be good option, tho I did not discuss that now. Note Clair Gulling will need lots of assistance as he is not comfortable with medical issues. She can have repeat paracentesis for comfort prn, does seem to be reaccumulating now; not discussed, fact that fluid is chylous is obviously worse situation from standpoint of disease progression. 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 She is not candidate for any further aggressive treatment. Hospice referral made now, and we appreciate their availability tomorrow. 2.multiple  embolic CVAs fall 9678 with subsequent bleeds, and recurrent episodes of superficial thrombophlebitis + previous DVT. IVC filter in. Risks of coumadin now outweigh benefit so that this was DCd last week. ASA ok if able to take po's and if no bleeding. 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 This has not been symptomatic, remarkably so. 4.DNR patient's request. Chauncey Reading is friend Clair Gulling. Clair Gulling will need assistance at home as she declines, or she will need Materials engineer.  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 No pain now that I can tell related to this and not candidate for stent with rest of situation 8. DNR patient's request. Cyd Silence is HCPOA. 9.flu vaccine done  She and Clair Gulling understand that they can call here as well as calling Hospice at any time if needed, as we will work closely with Hospice and continue to be involved. TIme spent 25 min including >50% counseling and coordination of care.      Jillisa Harris P, MD   01/28/2014, 4:27 PM

## 2014-01-28 NOTE — Assessment & Plan Note (Signed)
Etoposide was discontinued and early October 2015 due to progression of disease.  Patient is currently receiving no active treatment.  Have begun discussion regarding hospice care with patient.  Patient has a return visit scheduled for followup with Dr. Marko Plume for tomorrow 01/28/2014.

## 2014-01-28 NOTE — ED Notes (Signed)
Pt's contact:   Monique [name]--- tel# [phone]

## 2014-01-28 NOTE — Assessment & Plan Note (Signed)
Patient has been suffering was fairly significant nausea and vomiting; and causing some obvious dehydration.  Patient is receiving IV fluid rehydration here at the [facility] today; and continues to actively vomit on occasion.  Patient was given Zofran and Pepcid IV; with minimal effectiveness.  Patient was given Ativan 0.5 mg IV which did seem to help with her nausea.  Also, confirmed that patient does have Zofran ODT at home to take for her nausea.

## 2014-01-28 NOTE — ED Notes (Signed)
Brought in by [facility] from home with c/o abdominal pain with emesis.  Pt reports that she has been having abdominal "spasms" since last week and lately, started vomiting.  Pt reports symptoms getting progressively worse.  Pt has hx of ovarian cancer.

## 2014-01-28 NOTE — ED Notes (Signed)
Bed: PR94 Expected date: 01/28/14 Expected time: 9:08 PM Means of arrival: Ambulance Comments: Ovarian cancer, vomiting

## 2014-01-28 NOTE — Assessment & Plan Note (Signed)
Patient has been suffering with some significant nausea/vomiting; and did receive 1 L normal saline IV fluid rehydration today in the infusion Center.

## 2014-01-29 ENCOUNTER — Encounter (HOSPITAL_COMMUNITY): Payer: Self-pay | Admitting: Internal Medicine

## 2014-01-29 ENCOUNTER — Inpatient Hospital Stay (HOSPITAL_COMMUNITY): Payer: Medicare Other

## 2014-01-29 DIAGNOSIS — C787 Secondary malignant neoplasm of liver and intrahepatic bile duct: Secondary | ICD-10-CM | POA: Diagnosis present

## 2014-01-29 DIAGNOSIS — C562 Malignant neoplasm of left ovary: Secondary | ICD-10-CM

## 2014-01-29 DIAGNOSIS — Z7982 Long term (current) use of aspirin: Secondary | ICD-10-CM | POA: Diagnosis not present

## 2014-01-29 DIAGNOSIS — I427 Cardiomyopathy due to drug and external agent: Secondary | ICD-10-CM | POA: Diagnosis present

## 2014-01-29 DIAGNOSIS — T451X5A Adverse effect of antineoplastic and immunosuppressive drugs, initial encounter: Secondary | ICD-10-CM | POA: Diagnosis present

## 2014-01-29 DIAGNOSIS — E43 Unspecified severe protein-calorie malnutrition: Secondary | ICD-10-CM | POA: Insufficient documentation

## 2014-01-29 DIAGNOSIS — R188 Other ascites: Secondary | ICD-10-CM

## 2014-01-29 DIAGNOSIS — C7800 Secondary malignant neoplasm of unspecified lung: Secondary | ICD-10-CM | POA: Diagnosis present

## 2014-01-29 DIAGNOSIS — R180 Malignant ascites: Secondary | ICD-10-CM | POA: Diagnosis present

## 2014-01-29 DIAGNOSIS — R112 Nausea with vomiting, unspecified: Secondary | ICD-10-CM | POA: Diagnosis present

## 2014-01-29 DIAGNOSIS — E872 Acidosis: Secondary | ICD-10-CM | POA: Diagnosis present

## 2014-01-29 DIAGNOSIS — D6481 Anemia due to antineoplastic chemotherapy: Secondary | ICD-10-CM | POA: Diagnosis present

## 2014-01-29 DIAGNOSIS — Z880 Allergy status to penicillin: Secondary | ICD-10-CM | POA: Diagnosis not present

## 2014-01-29 DIAGNOSIS — Z8673 Personal history of transient ischemic attack (TIA), and cerebral infarction without residual deficits: Secondary | ICD-10-CM | POA: Diagnosis not present

## 2014-01-29 DIAGNOSIS — J449 Chronic obstructive pulmonary disease, unspecified: Secondary | ICD-10-CM | POA: Diagnosis present

## 2014-01-29 DIAGNOSIS — N19 Unspecified kidney failure: Secondary | ICD-10-CM | POA: Insufficient documentation

## 2014-01-29 DIAGNOSIS — R109 Unspecified abdominal pain: Secondary | ICD-10-CM

## 2014-01-29 DIAGNOSIS — E860 Dehydration: Secondary | ICD-10-CM | POA: Diagnosis present

## 2014-01-29 DIAGNOSIS — Z66 Do not resuscitate: Secondary | ICD-10-CM | POA: Diagnosis present

## 2014-01-29 DIAGNOSIS — Z87891 Personal history of nicotine dependence: Secondary | ICD-10-CM | POA: Diagnosis not present

## 2014-01-29 DIAGNOSIS — D72829 Elevated white blood cell count, unspecified: Secondary | ICD-10-CM | POA: Diagnosis present

## 2014-01-29 DIAGNOSIS — Z23 Encounter for immunization: Secondary | ICD-10-CM | POA: Diagnosis not present

## 2014-01-29 DIAGNOSIS — N179 Acute kidney failure, unspecified: Secondary | ICD-10-CM | POA: Diagnosis present

## 2014-01-29 DIAGNOSIS — N1330 Unspecified hydronephrosis: Secondary | ICD-10-CM

## 2014-01-29 DIAGNOSIS — Z515 Encounter for palliative care: Secondary | ICD-10-CM

## 2014-01-29 DIAGNOSIS — Z90722 Acquired absence of ovaries, bilateral: Secondary | ICD-10-CM | POA: Diagnosis present

## 2014-01-29 DIAGNOSIS — Z79899 Other long term (current) drug therapy: Secondary | ICD-10-CM | POA: Diagnosis not present

## 2014-01-29 DIAGNOSIS — E871 Hypo-osmolality and hyponatremia: Secondary | ICD-10-CM | POA: Diagnosis present

## 2014-01-29 DIAGNOSIS — K311 Adult hypertrophic pyloric stenosis: Secondary | ICD-10-CM | POA: Diagnosis present

## 2014-01-29 DIAGNOSIS — Z6821 Body mass index (BMI) 21.0-21.9, adult: Secondary | ICD-10-CM | POA: Diagnosis not present

## 2014-01-29 DIAGNOSIS — K219 Gastro-esophageal reflux disease without esophagitis: Secondary | ICD-10-CM | POA: Diagnosis present

## 2014-01-29 DIAGNOSIS — J9601 Acute respiratory failure with hypoxia: Secondary | ICD-10-CM

## 2014-01-29 DIAGNOSIS — C561 Malignant neoplasm of right ovary: Secondary | ICD-10-CM | POA: Diagnosis present

## 2014-01-29 DIAGNOSIS — Z85828 Personal history of other malignant neoplasm of skin: Secondary | ICD-10-CM | POA: Diagnosis not present

## 2014-01-29 DIAGNOSIS — Z882 Allergy status to sulfonamides status: Secondary | ICD-10-CM | POA: Diagnosis not present

## 2014-01-29 DIAGNOSIS — Z888 Allergy status to other drugs, medicaments and biological substances status: Secondary | ICD-10-CM | POA: Diagnosis not present

## 2014-01-29 DIAGNOSIS — Z86718 Personal history of other venous thrombosis and embolism: Secondary | ICD-10-CM | POA: Diagnosis not present

## 2014-01-29 DIAGNOSIS — D638 Anemia in other chronic diseases classified elsewhere: Secondary | ICD-10-CM

## 2014-01-29 DIAGNOSIS — Z8249 Family history of ischemic heart disease and other diseases of the circulatory system: Secondary | ICD-10-CM | POA: Diagnosis not present

## 2014-01-29 DIAGNOSIS — Z9889 Other specified postprocedural states: Secondary | ICD-10-CM | POA: Diagnosis not present

## 2014-01-29 DIAGNOSIS — I5022 Chronic systolic (congestive) heart failure: Secondary | ICD-10-CM | POA: Diagnosis present

## 2014-01-29 LAB — CBC
HCT: 36.2 % (ref 36.0–46.0)
Hemoglobin: 12.1 g/dL (ref 12.0–15.0)
MCH: 29.9 pg (ref 26.0–34.0)
MCHC: 33.4 g/dL (ref 30.0–36.0)
MCV: 89.4 fL (ref 78.0–100.0)
Platelets: 395 K/uL (ref 150–400)
RBC: 4.05 MIL/uL (ref 3.87–5.11)
RDW: 18.2 % — ABNORMAL HIGH (ref 11.5–15.5)
WBC: 15.3 K/uL — ABNORMAL HIGH (ref 4.0–10.5)

## 2014-01-29 LAB — BASIC METABOLIC PANEL WITH GFR
Anion gap: 15 (ref 5–15)
BUN: 64 mg/dL — ABNORMAL HIGH (ref 6–23)
CO2: 19 meq/L (ref 19–32)
Calcium: 8.9 mg/dL (ref 8.4–10.5)
Chloride: 95 meq/L — ABNORMAL LOW (ref 96–112)
Creatinine, Ser: 1.92 mg/dL — ABNORMAL HIGH (ref 0.50–1.10)
GFR calc Af Amer: 30 mL/min — ABNORMAL LOW
GFR calc non Af Amer: 26 mL/min — ABNORMAL LOW
Glucose, Bld: 128 mg/dL — ABNORMAL HIGH (ref 70–99)
Potassium: 4.2 meq/L (ref 3.7–5.3)
Sodium: 129 meq/L — ABNORMAL LOW (ref 137–147)

## 2014-01-29 MED ORDER — ENOXAPARIN SODIUM 30 MG/0.3ML ~~LOC~~ SOLN
30.0000 mg | SUBCUTANEOUS | Status: DC
  Filled 2014-01-29: qty 0.3

## 2014-01-29 MED ORDER — PROMETHAZINE HCL 25 MG/ML IJ SOLN
12.5000 mg | Freq: Four times a day (QID) | INTRAMUSCULAR | Status: DC | PRN
  Administered 2014-01-30: 12.5 mg via INTRAVENOUS
  Filled 2014-01-29 (×2): qty 1

## 2014-01-29 MED ORDER — ONDANSETRON HCL 4 MG/2ML IJ SOLN
4.0000 mg | Freq: Four times a day (QID) | INTRAMUSCULAR | Status: DC | PRN
Start: 2014-01-29 — End: 2014-01-29
  Administered 2014-01-29: 4 mg via INTRAVENOUS
  Filled 2014-01-29: qty 2

## 2014-01-29 MED ORDER — SODIUM CHLORIDE 0.9 % IV SOLN: INTRAVENOUS | Status: DC

## 2014-01-29 MED ORDER — ONDANSETRON HCL 4 MG PO TABS: 8.0000 mg | ORAL_TABLET | Freq: Four times a day (QID) | ORAL | Status: DC | PRN

## 2014-01-29 MED ORDER — MORPHINE SULFATE (CONCENTRATE) 10 MG /0.5 ML PO SOLN: 5.0000 mg | ORAL | Status: DC | PRN

## 2014-01-29 MED ORDER — HEPARIN SODIUM (PORCINE) 5000 UNIT/ML IJ SOLN
5000.0000 [IU] | Freq: Three times a day (TID) | INTRAMUSCULAR | Status: DC
  Administered 2014-01-29: 5000 [IU] via SUBCUTANEOUS
  Filled 2014-01-29 (×4): qty 1

## 2014-01-29 MED ORDER — PANTOPRAZOLE SODIUM 40 MG PO TBEC
40.0000 mg | DELAYED_RELEASE_TABLET | Freq: Every day | ORAL | Status: DC
  Administered 2014-01-29: 40 mg via ORAL
  Filled 2014-01-29: qty 1

## 2014-01-29 MED ORDER — METOCLOPRAMIDE HCL 5 MG/ML IJ SOLN
10.0000 mg | Freq: Four times a day (QID) | INTRAMUSCULAR | Status: DC | PRN
  Administered 2014-01-30 – 2014-02-01 (×8): 10 mg via INTRAVENOUS
  Filled 2014-01-29 (×8): qty 2

## 2014-01-29 MED ORDER — PANTOPRAZOLE SODIUM 40 MG IV SOLR
40.0000 mg | Freq: Two times a day (BID) | INTRAVENOUS | Status: DC
  Administered 2014-01-29 – 2014-02-01 (×6): 40 mg via INTRAVENOUS
  Filled 2014-01-29 (×7): qty 40

## 2014-01-29 MED ORDER — ASPIRIN 81 MG PO CHEW
81.0000 mg | CHEWABLE_TABLET | Freq: Every day | ORAL | Status: DC
  Administered 2014-01-29: 81 mg via ORAL
  Filled 2014-01-29: qty 1

## 2014-01-29 MED ORDER — DEXAMETHASONE SODIUM PHOSPHATE 4 MG/ML IJ SOLN
4.0000 mg | Freq: Three times a day (TID) | INTRAMUSCULAR | Status: DC
  Administered 2014-01-29 – 2014-02-01 (×10): 4 mg via INTRAVENOUS
  Filled 2014-01-29 (×12): qty 1

## 2014-01-29 MED ORDER — ONDANSETRON HCL 4 MG PO TABS: 4.0000 mg | ORAL_TABLET | Freq: Four times a day (QID) | ORAL | Status: DC | PRN

## 2014-01-29 MED ORDER — ACETAMINOPHEN 650 MG RE SUPP
650.0000 mg | Freq: Four times a day (QID) | RECTAL | Status: DC | PRN
Start: 2014-01-29 — End: 2014-02-01

## 2014-01-29 MED ORDER — SODIUM CHLORIDE 0.9 % IV SOLN
25.0000 ug/h | INTRAVENOUS | Status: DC
  Administered 2014-01-29 – 2014-01-31 (×3): 25 ug/h via INTRAVENOUS
  Filled 2014-01-29 (×6): qty 1

## 2014-01-29 MED ORDER — MORPHINE SULFATE 2 MG/ML IJ SOLN
2.0000 mg | INTRAMUSCULAR | Status: DC | PRN
  Administered 2014-01-30: 2 mg via INTRAVENOUS
  Filled 2014-01-29: qty 1

## 2014-01-29 MED ORDER — ONDANSETRON 8 MG/NS 50 ML IVPB
8.0000 mg | Freq: Four times a day (QID) | INTRAVENOUS | Status: DC
  Administered 2014-01-29 – 2014-02-01 (×12): 8 mg via INTRAVENOUS
  Filled 2014-01-29 (×14): qty 8

## 2014-01-29 MED ORDER — SENNOSIDES-DOCUSATE SODIUM 8.6-50 MG PO TABS: 1.0000 | ORAL_TABLET | Freq: Every day | ORAL | Status: DC | PRN

## 2014-01-29 MED ORDER — CETYLPYRIDINIUM CHLORIDE 0.05 % MT LIQD: 7.0000 mL | OROMUCOSAL | Status: DC | PRN

## 2014-01-29 MED ORDER — BOOST / RESOURCE BREEZE PO LIQD
1.0000 | Freq: Two times a day (BID) | ORAL | Status: DC
  Administered 2014-01-29 – 2014-01-31 (×3): 1 via ORAL

## 2014-01-29 MED ORDER — METOPROLOL SUCCINATE 12.5 MG HALF TABLET
12.5000 mg | ORAL_TABLET | Freq: Three times a day (TID) | ORAL | Status: DC
  Administered 2014-01-29: 10:00:00 via ORAL
  Filled 2014-01-29 (×3): qty 1

## 2014-01-29 MED ORDER — LORAZEPAM 2 MG/ML IJ SOLN: 0.5000 mg | INTRAMUSCULAR | Status: DC | PRN

## 2014-01-29 MED ORDER — SODIUM CHLORIDE 0.9 % IV SOLN
INTRAVENOUS | Status: AC
  Administered 2014-01-29: 50 mL via INTRAVENOUS
  Administered 2014-01-29: 03:00:00 via INTRAVENOUS

## 2014-01-29 MED ORDER — ONDANSETRON 8 MG/NS 50 ML IVPB: 8.0000 mg | Freq: Four times a day (QID) | INTRAVENOUS | Status: DC | PRN

## 2014-01-29 MED ORDER — BISACODYL 10 MG RE SUPP: 10.0000 mg | Freq: Every day | RECTAL | Status: DC | PRN

## 2014-01-29 MED ORDER — ACETAMINOPHEN 325 MG PO TABS: 650.0000 mg | ORAL_TABLET | Freq: Four times a day (QID) | ORAL | Status: DC | PRN

## 2014-01-29 MED ORDER — ONDANSETRON HCL 4 MG PO TABS: 8.0000 mg | ORAL_TABLET | Freq: Two times a day (BID) | ORAL | Status: DC | PRN

## 2014-01-29 MED ORDER — HYOSCYAMINE SULFATE 0.125 MG PO TABS
0.1250 mg | ORAL_TABLET | Freq: Two times a day (BID) | ORAL | Status: DC | PRN
  Administered 2014-01-29: 0.125 mg via ORAL
  Filled 2014-01-29: qty 1

## 2014-01-29 NOTE — Progress Notes (Signed)
INITIAL NUTRITION ASSESSMENT  Pt meets criteria for severe MALNUTRITION in the context of chronic illness as evidenced by <75% estimated energy intake in the past month per pt report with severe fluid accumulation.  DOCUMENTATION CODES Per approved criteria  -Severe malnutrition in the context of chronic illness   INTERVENTION: - Resource Breeze BID - Nutrition per goals of care - RD to continue to monitor   NUTRITION DIAGNOSIS: Inadequate oral intake related to clear liquid diet as evidenced by diet order.   Goal: Nutrition per goals of care   Monitor:  Weights, labs, diet advancement, goals of care  Reason for Assessment: Malnutrition screening tool   68 y.o. female  Admitting Dx: Nausea with vomiting  ASSESSMENT: Pt with history of metastatic ovarian cancer presents to the ER because of persistent nausea and vomiting. Patient has been having increasing nausea vomiting last one week and over the last few days was unable to keep in anything. Patient last week also had paracentesis done due to ascites. Patient had CAT scan of abdomen and pelvis done about 10 days ago which showed suspicion for high-grade partial small bowel obstruction. Patient's chemotherapy was stopped by patient's oncologist due to progressive disease. Patient also had history of embolic CVA and patient's Coumadin was stopped a few days ago because of progressive metastatic cancer involving the liver. Patient's labs today reveal worsening of renal function and patient has been admitted for further management.   - Met with pt who reports she hasn't been able to eat much of anything x 4 weeks - Said a RN at the cancer center recommended Atkins protein shake which pt sipped on as desired since she does not like Ensure - Has recently been consuming just clear liquids including grape juice, water, and ginger-ale once it goes flat to prevent getting in the carbonation - Said she weighed 150 pounds prior to paracentesis  earlier this month - Reports her nausea is under control currently - Agreeable to trying [employer]  - Palliative care has been consulted   Height: Ht Readings from Last 1 Encounters:  01/29/14 _0  (1.702 m)    Weight: Wt Readings from Last 1 Encounters:  01/29/14 134 lb 1.6 oz (60.827 kg)    Ideal Body Weight: 135 lbs  % Ideal Body Weight: 99%  Wt Readings from Last 10 Encounters:  01/29/14 134 lb 1.6 oz (60.827 kg)  01/28/14 130 lb 6.4 oz (59.149 kg)  01/23/14 153 lb 14.4 oz (69.809 kg)  01/14/14 156 lb 5 oz (70.903 kg)  12/22/13 152 lb 9.6 oz (69.219 kg)  11/28/13 152 lb 1.6 oz (68.992 kg)  11/07/13 151 lb 14.4 oz (68.901 kg)  10/27/13 152 lb 4.8 oz (69.083 kg)  10/06/13 149 lb 14.4 oz (67.994 kg)  09/22/13 153 lb (69.4 kg)    Usual Body Weight: 150 lbs per pt (with fluid)  % Usual Body Weight: 89%  BMI:  Body mass index is 21 kg/(m^2).  Estimated Nutritional Needs: Kcal: 1800-2000 Protein: 70-90g Fluid: 1.8-2L/day   Skin: intact   Diet Order: Clear Liquid  EDUCATION NEEDS: -No education needs identified at this time   Intake/Output Summary (Last 24 hours) at 01/29/14 1042 Last data filed at 01/29/14 0600  Gross per 24 hour  Intake   2175 ml  Output    100 ml  Net   2075 ml    Last BM: 10/3  Labs:   Recent Labs Lab 01/23/14 1407 01/28/14 2247 01/29/14 0423  NA 135* 130*  129*  K 4.4 4.3 4.2  CL  --  92* 95*  CO2 25 18* 19  BUN 26.2* 64* 64*  CREATININE 1.1 2.21* 1.92*  CALCIUM 10.3 10.2 8.9  GLUCOSE 94 147* 128*    CBG (last 3)  No results found for this basename: GLUCAP,  in the last 72 hours  Scheduled Meds: . aspirin  81 mg Oral Daily  . heparin  5,000 Units Subcutaneous 3 times per day  . metoprolol succinate  12.5 mg Oral TID  . pantoprazole  40 mg Oral Daily    Continuous Infusions: . sodium chloride 50 mL/hr at 01/29/14 0230    Past Medical History  Diagnosis Date  . Acute venous embolism and thrombosis of  unspecified deep vessels of lower extremity   . Bronchitis     several times  . Abdominal or pelvic swelling, mass or lump, unspecified site   . Phlebitis and thrombophlebitis of unspecified site   . Pelvic mass   . GERD (gastroesophageal reflux disease)   . PONV (postoperative nausea and vomiting)     "when fentanyl is used, will break out in itchy rast"  . CHF (congestive heart failure)     "just since 11/2012; they think it's from the chemo" (01/21/2013)  . 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 DVT (deep venous thrombosis) 04/2012    "BLE" (01/21/2013)  . Chronic bronchitis     "I had it several times; I used to smoke" (01/21/2013)  . Shortness of breath     "trouble taking a deep breath at times before RX started; I'm fine now" (01/21/2013)  . Anemia     "because of the chemo" (01/21/2013)  . Arthritis     "little in my right hand" (01/21/2013)  . Anxiety     "related to ovarian cancer; don't take RX for it" (01/21/2013)  . Basal cell carcinoma of face   . Ovarian cancer dx'd 2013    Past Surgical History  Procedure Laterality Date  . Appendectomy  1966    ruptured, hospital for 3 weeks  . Rhinoplasty  1976  . Vein ligation and stripping Right 1998    leg  . Mohs surgery  2004, 2005    basal cell of the face  . Tooth extraction  03/2011?    "just one; had a root canal that got infected" (01/21/2013)  . Laparotomy N/A 09/10/2012    Procedure: EXPLORATORY LAPAROTOMY, BILATERAL SALPINGO OOPHORECTOMY, TUMOR DEBULKING;  Surgeon: Imagene Gurney A. Alycia Rossetti, MD;  Location: [facility];  Service: Gynecology;  Laterality: N/A;  . Exploratory laparotomy  09/10/12    Lysis of adhesions, BSO, suboptimal tumor debulking  . Vena cava filter placement  08/2012    Carlis Stable MS, RD, LDN [phone] Pager [phone] Weekend/After Hours Pager

## 2014-01-29 NOTE — Consult Note (Addendum)
Patient Monique [name]      DOB: 22-Oct-1945      HER:[id number]  Summary of goals of care; full note to follow:   Met with patient and her significant other Cyd Silence who is her POA. Patient not very open with her thoughts related to her illness. She could not free speak a summary of where her thoughts were surrounding the most recent changes.  Clair Gulling relates this was kind of a quick set of events. Last Friday they were told about not further chemo  And offered hospice options. He reports that he feels that he can't take care of her at home without hurting her. She has continued to progressively decline being unable to sleep due to spasms and nausea and wretching and too weak to get to the bathroom .  She is a bit of stubborn individual stating I don't like pain meds.  She sits and very controlledly wretches into her pan.  She is elegant in her fragility.  She is in a way acquiescing to her disease and the thoughts of those around her. She was only bold enough to say that her disease is "progressing" instead of" I am dying. " She reports that she does not see religion or faith as a part of this process.  She did ask the locations of hospice and did ask if her [place] can come to visit as it is "the center of their world together."   She has agreed to talk with Clair Gulling about the [place] transition. I offered to have the hospice of their choice come to answer questions when they are ready.  For now she needs significant symptom management.She agrees to let me try steroids and octreotide.  She is not keen on NGT which since she isn't bringing up a lot I don't think will help.     Patient qualifies for hospice care with 6 months or less prognosis but also may in reality have a days to week prognosis qualifying her for hospice Facility care.  Her symptoms will need aggressive management.   Recommend: 1.  DNR in place, but patient still struggling with prognosis  2.  Pain : roxanol in place. She is  resistant to using  3.  Nausea with severe cramping spasms:  Start octreotide and steroids. Schedule zofran.  Allow cautious use of Reglan in context of potential obstruction. Phenergan also present . [name] was not helping and she hated how it felt to be on ativan, both were discontinued.  Total time:  305- 424 pm   Melody Savidge L. Lovena Le, MD MBA The Palliative Medicine Team at [facility] Phone: [phone] Pager: [phone] ( Use team phone after hours)

## 2014-01-29 NOTE — Progress Notes (Signed)
[facility] PROGRESS NOTE  Monique [name] WER:[id number] DOB: 1945/12/24 DOA: 01/28/2014 PCP: Phineas Inches, MD  Assessment/Plan  Nausea vomiting, probably related to patient's metastatic cancer with possible bowel obstruction -  Continue CLD as tolerated -  Continue slow IVF  -  Continue zofran -  Will add phenergan for breakthrough nausea  -  Palliative care consult for symptom management:  Consideration of octreotide? -  Change medications to sublingual or IV for now  Acute renal failure with metabolic acidosis, likely prerenal and improved somewhat with hydration already.  CAT scan done last week did showed new right hydronephrosis, however, her prognosis is limited and she is not a good candidate for stent placement -  Continue IVF as above -  Renally dose medications, including lovenox  Increasing abdominal pain with malignant Ascites, last paracentesis on 10/2  -  repeat paracentesis ordered -   Add sublingual morphine for mod to severe pain with IV if she is having too much nausea and vomiting  Metastatic ovarian carcinoma - patient's treatment was with progressive disease.  -  Patient was referred to hospice:  She is thinking about inpatient hospice vs. Home with home hospice -  Further conversation about hospice care with Dr. Lovena Le later today -  Appreciate Dr. 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 -  Patient's oncologist discontinued Coumadin few days ago  -  Change heparin to lovenox for now for simplicity of dosing, fewer sticks, better for CA-related VTE  Severe protein calorie malnutrition  -  Appreciate nutrition assistance -  Advance diet as tolerated and supplements  Leukocytosis, likely related to dehydration and progressive cancer.  No evidence of pneumonia clinically -   Check UA  Hyponatremia likely due to liver mets, ascities  -  D/c labs   Diet:  CLD Access:  PIV IVF:  yes Proph:  lovenox  Code Status: DNR Family Communication: patient alone Disposition Plan: either home with hospice or to inpatient hospice in 1-2 days   Consultants:  Palliative care  Dr. Marko Plume  Procedures:    10/7 KUB:  No free air or bowel obstruction  Antibiotics:  None   HPI/Subjective:  Abdominal cramps, nausea but no significant vomiting in the last few days.  No recent BMs.    Objective: Filed Vitals:   01/29/14 0101 01/29/14 0131 01/29/14 0132 01/29/14 0528  BP: 109/72 98/53  115/83  Pulse:    68  Temp: 98.4 F (36.9 C) 98.2 F (36.8 C)  98.3 F (36.8 C)  TempSrc: Oral Oral  Oral  Resp: 18 18  18   Height:   5\' 7"  (1.702 m)   Weight:   60.827 kg (134 lb 1.6 oz)   SpO2: 98% 92%  91%    Intake/Output Summary (Last 24 hours) at 01/29/14 1158 Last data filed at 01/29/14 1000  Gross per 24 hour  Intake   2615 ml  Output    350 ml  Net   2265 ml   Filed Weights   01/29/14 0132  Weight: 60.827 kg (134 lb 1.6 oz)    Exam:   General:  WF, No acute distress  HEENT:  NCAT, MMM  Cardiovascular:  tachycardic RR, nl S1, S2 no mrg, 2+ pulses, warm extremities  Respiratory:  CTAB, no increased WOB  Abdomen:   hypoactive BS, soft, mildly  distended, nontender  MSK:   Normal tone and bulk, no LEE  Neuro:  Grossly intact  Data Reviewed: Basic Metabolic Panel:  Recent Labs Lab 01/23/14 1407 01/28/14 2247 01/29/14 0423  NA 135* 130* 129*  K 4.4 4.3 4.2  CL  --  92* 95*  CO2 25 18* 19  GLUCOSE 94 147* 128*  BUN 26.2* 64* 64*  CREATININE 1.1 2.21* 1.92*  CALCIUM 10.3 10.2 8.9   Liver Function Tests:  Recent Labs Lab 01/23/14 1407 01/28/14 2247  AST 34 19  ALT 17 12  ALKPHOS 153* 138*  BILITOT 0.58 0.4  PROT 7.4 8.0  ALBUMIN 3.2* 3.0*    Recent Labs Lab 01/28/14 2247  LIPASE 32   No results found for this basename:  AMMONIA,  in the last 168 hours CBC:  Recent Labs Lab 01/23/14 1405 01/28/14 2247 01/29/14 0423  WBC 6.2 17.4* 15.3*  NEUTROABS 4.6 14.3*  --   HGB 11.7 14.3 12.1  HCT 35.5 41.6 36.2  MCV 92.7 88.9 89.4  PLT 249 446* 395   Cardiac Enzymes: No results found for this basename: CKTOTAL, CKMB, CKMBINDEX, TROPONINI,  in the last 168 hours BNP (last 3 results) No results found for this basename: PROBNP,  in the last 8760 hours CBG: No results found for this basename: GLUCAP,  in the last 168 hours  No results found for this or any previous visit (from the past 240 hour(s)).   Studies: Dg Abd 2 Views  01/28/2014   CLINICAL DATA:  Abdominal pain, distention and vomiting. History of ovarian cancer.  EXAM: ABDOMEN - 2 VIEW  COMPARISON:  Abdominal ultrasound 01/23/2014. CT abdomen and pelvis 01/19/2014.  FINDINGS: No free intraperitoneal air is identified. The bowel gas pattern is nonobstructive. Increased density of the abdomen and centralization of bowel loops is compatible with ascites as seen on the comparison examinations. IVC filter is in place. No focal bony abnormality is identified.  IMPRESSION: Negative for free air or bowel obstruction.  Findings compatible with ascites.   Electronically Signed   By: Inge Rise M.D.   On: 01/28/2014 22:48    Scheduled Meds: . aspirin  81 mg Oral Daily  . feeding supplement (RESOURCE BREEZE)  1 Container Oral BID BM  . heparin  5,000 Units Subcutaneous 3 times per day  . metoprolol succinate  12.5 mg Oral TID  . pantoprazole  40 mg Oral Daily   Continuous Infusions: . sodium chloride 50 mL/hr at 01/29/14 0230    Principal Problem:   Nausea with vomiting Active Problems:   Chronic systolic heart failure   Acute renal failure   Ascites   Nausea & vomiting   Protein-calorie malnutrition, severe    Time spent: 30 min    Kazimierz Springborn, [facility] Pager [phone]. If 7PM-7AM, please contact night-coverage at  [email or web address], password [facility] 01/29/2014, 11:58 AM  LOS: 1 day

## 2014-01-29 NOTE — H&P (Signed)
[facility] History and Physical  BIRD TAILOR ION:[id number] DOB: 04-25-1945 DOA: 01/28/2014  Referring physician: ER physician. PCP: Phineas Inches, MD  Specialists: Dr. Marko Plume.   Chief Complaint: Nausea vomiting.  HPI: Monique [name] is a 68 y.o. female with history of metastatic ovarian cancer presents to the ER because of persistent nausea and vomiting. Patient has been having increasing nausea vomiting last one week and over the last few days was unable to keep in anything. Patient last week also had paracentesis done due to ascites. Patient had CAT scan of abdomen and pelvis done about 10 days ago which showed suspicion for high-grade partial small bowel obstruction. Patient's chemotherapy was stopped by patient's oncologist due to progressive disease. Patient also had history of embolic CVA and patient's Coumadin was stopped a few days ago because of progressive metastatic cancer involving the liver. Patient's labs today reveal worsening of renal function and patient has been admitted for further management. X-rays done in the ER does not show any definite signs of obstruction. Patient denies any chest pain or shortness of breath.   Review of Systems: As presented in the history of presenting illness, rest negative.  Past Medical History  Diagnosis Date  . Acute venous embolism and thrombosis of unspecified deep vessels of lower extremity   . Bronchitis     several times  . Abdominal or pelvic swelling, mass or lump, unspecified site   . Phlebitis and thrombophlebitis of unspecified site   . Pelvic mass   . GERD (gastroesophageal reflux disease)   . PONV (postoperative nausea and vomiting)     "when fentanyl is used, will break out in itchy rast"  . CHF (congestive heart failure)     "just since 11/2012; they think it's from the chemo" (01/21/2013)  . 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 DVT (deep venous thrombosis) 04/2012     "BLE" (01/21/2013)  . Chronic bronchitis     "I had it several times; I used to smoke" (01/21/2013)  . Shortness of breath     "trouble taking a deep breath at times before RX started; I'm fine now" (01/21/2013)  . Anemia     "because of the chemo" (01/21/2013)  . Arthritis     "little in my right hand" (01/21/2013)  . Anxiety     "related to ovarian cancer; don't take RX for it" (01/21/2013)  . Basal cell carcinoma of face   . Ovarian cancer dx'd 2013   Past Surgical History  Procedure Laterality Date  . Appendectomy  1966    ruptured, hospital for 3 weeks  . Rhinoplasty  1976  . Vein ligation and stripping Right 1998    leg  . Mohs surgery  2004, 2005    basal cell of the face  . Tooth extraction  03/2011?    "just one; had a root canal that got infected" (01/21/2013)  . Laparotomy N/A 09/10/2012    Procedure: EXPLORATORY LAPAROTOMY, BILATERAL SALPINGO OOPHORECTOMY, TUMOR DEBULKING;  Surgeon: Imagene Gurney A. Alycia Rossetti, MD;  Location: [facility];  Service: Gynecology;  Laterality: N/A;  . Exploratory laparotomy  09/10/12    Lysis of adhesions, BSO, suboptimal tumor debulking  . Vena cava filter placement  08/2012   Social History:  reports that she quit smoking about 21 months ago. Her smoking use included Cigarettes. She smoked 0.00 packs per day for 30 years. She has never used smokeless tobacco. She reports that she  drinks alcohol. She reports that she does not use illicit drugs. Where does patient live home. Can patient participate in ADLs? Yes.  Allergies  Allergen Reactions  . Septra [Sulfamethoxazole-Tmp Ds] Rash and Other (See Comments)    High fever, Burning skin  . Ampicillin Itching  . Fentanyl And Related Hives    Per Dr. Marcell Barlow, pt reports she is ok with fentanyl (09/10/12). Patient states it was epidural form of Fentanyl that she reacted to.   . Neosporin [Neomycin-Bacitracin Zn-Polymyx] Swelling and Other (See Comments)    opthalmic solution only can use topically. Swelling and  inflamed eyes    Family History:  Family History  Problem Relation Age of Onset  . Pneumonia Mother   . Heart attack Father   . Cancer Neg Hx       Prior to Admission medications   Medication Sig Start Date End Date Taking? Authorizing Provider  acetaminophen (TYLENOL) 500 MG tablet Take 500 mg by mouth every 6 (six) hours as needed for mild pain.   Yes Historical Provider, MD  aspirin 81 MG tablet Take 81 mg by mouth daily.  04/18/13  Yes Delfina Redwood, MD  hyoscyamine ([name]) 0.125 MG tablet Take one tablet by mouth daily as needed for stomach spasms. 01/08/14  Yes Lennis Marion Downer, MD  lisinopril (PRINIVIL,ZESTRIL) 2.5 MG tablet Take 1 tablet (2.5 mg total) by mouth daily. 10/14/13  Yes Belva Crome III, MD  metoprolol succinate (TOPROL-XL) 25 MG 24 hr tablet Take 0.5 tablets (12.5 mg total) by mouth 3 (three) times daily. 12/26/13  Yes Belva Crome III, MD  ondansetron (ZOFRAN) 8 MG tablet Take 1 tablet (8 mg total) by mouth every 12 (twelve) hours as needed for nausea or vomiting. 10/27/13  Yes Lennis Marion Downer, MD  pantoprazole (PROTONIX) 40 MG tablet Take 1 tablet (40 mg total) by mouth daily. 11/26/13  Yes Lennis Marion Downer, MD  sennosides-docusate sodium (SENOKOT-S) 8.6-50 MG tablet Take 1-2 tablets by mouth daily as needed for constipation.   Yes Historical Provider, MD  spironolactone (ALDACTONE) 25 MG tablet TAKE 1 TABLET BY MOUTH EVERY DAY 01/12/14  Yes Belva Crome III, MD  spironolactone (ALDACTONE) 25 MG tablet Take 25 mg by mouth daily.   Yes Historical Provider, MD  triamcinolone (KENALOG) 0.1 % paste Apply to mouth ulcers bid prn 12/09/13  Yes Gordy Levan, MD    Physical Exam: Filed Vitals:   01/28/14 2330 01/29/14 0000 01/29/14 0101 01/29/14 0131  BP: 137/63 114/73 109/72 98/53  Pulse: 107     Temp:   98.4 F (36.9 C) 98.2 F (36.8 C)  TempSrc:   Oral Oral  Resp:   18 18  SpO2: 98%  98% 92%     General:  Moderately built and nourished.  Eyes: Anicteric  no pallor.  ENT: No discharge from the ears eyes nose mouth.  Neck: No mass felt.  Cardiovascular: S1-S2 heard.  Respiratory: No rhonchi or crepitations.  Abdomen: Soft distended nontender bowel sounds are appreciated.  Skin: No rash.  Musculoskeletal: No edema.  Psychiatric: Appears normal.  Neurologic: Alert and oriented to time place and person. Moves all extremities.  Labs on Admission:  Basic Metabolic Panel:  Recent Labs Lab 01/23/14 1407 01/28/14 2247  NA 135* 130*  K 4.4 4.3  CL  --  92*  CO2 25 18*  GLUCOSE 94 147*  BUN 26.2* 64*  CREATININE 1.1 2.21*  CALCIUM 10.3 10.2   Liver Function Tests:  Recent Labs Lab 01/23/14 1407 01/28/14 2247  AST 34 19  ALT 17 12  ALKPHOS 153* 138*  BILITOT 0.58 0.4  PROT 7.4 8.0  ALBUMIN 3.2* 3.0*    Recent Labs Lab 01/28/14 2247  LIPASE 32   No results found for this basename: AMMONIA,  in the last 168 hours CBC:  Recent Labs Lab 01/23/14 1405 01/28/14 2247  WBC 6.2 17.4*  NEUTROABS 4.6 14.3*  HGB 11.7 14.3  HCT 35.5 41.6  MCV 92.7 88.9  PLT 249 446*   Cardiac Enzymes: No results found for this basename: CKTOTAL, CKMB, CKMBINDEX, TROPONINI,  in the last 168 hours  BNP (last 3 results) No results found for this basename: PROBNP,  in the last 8760 hours CBG: No results found for this basename: GLUCAP,  in the last 168 hours  Radiological Exams on Admission: Dg Abd 2 Views  01/28/2014   CLINICAL DATA:  Abdominal pain, distention and vomiting. History of ovarian cancer.  EXAM: ABDOMEN - 2 VIEW  COMPARISON:  Abdominal ultrasound 01/23/2014. CT abdomen and pelvis 01/19/2014.  FINDINGS: No free intraperitoneal air is identified. The bowel gas pattern is nonobstructive. Increased density of the abdomen and centralization of bowel loops is compatible with ascites as seen on the comparison examinations. IVC filter is in place. No focal bony abnormality is identified.  IMPRESSION: Negative for free air or  bowel obstruction.  Findings compatible with ascites.   Electronically Signed   By: Inge Rise M.D.   On: 01/28/2014 22:48     Assessment/Plan Principal Problem:   Nausea with vomiting Active Problems:   Chronic systolic heart failure   Acute renal failure   Ascites   Nausea & vomiting   1. Nausea vomiting - probably related to patient's metastatic cancer with possible bowel obstruction. At this time I have placed patient on clear liquid diet and if patient still continues to have nausea and vomiting and patient is to be n.p.o. Patient is on gentle hydration. 2. Acute renal failure with metabolic acidosis - probably secondary to dehydration from nausea vomiting and poor oral intake and patient is also on diuretics. At this time patient has received fluid bolus in the ER. We'll hold off diuretics and gently hydrate caution that patient does have history of cardiomyopathy. Closely follow intake output and metabolic panel. Patient's CAT scan done last week did show mild hydronephrosis. 3. Ascites - have ordered ultrasound guided paracentesis. Patient did have paracentesis last week. 4. Metastatic ovarian carcinoma - patient's treatment was with progressive disease. Patient was referred to hospice. Patient's oncologist Dr. Marko Plume to be consulted for further recommendations. 5. 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 See #1 and 2. 6. History of embolic CVA - due to progressive metastatic ovarian carcinoma involving the liver patient's oncologist has discontinued Coumadin few days ago.  I have consulted hospice for goals of care.   Code Status: DO NOT RESUSCITATE.  Family Communication: None.  Disposition Plan: Admit to inpatient.    Vanesa Renier N. [facility] Pager [phone].  If 7PM-7AM, please contact night-coverage [email or web address] Password [email or web address] 01/29/2014, 2:19 AM

## 2014-01-29 NOTE — Progress Notes (Signed)
ANTICOAGULATION CONSULT NOTE - Initial Consult  Pharmacy Consult for Lovenox Indication: VTE prophylaxis  Allergies  Allergen Reactions  . Septra [Sulfamethoxazole-Tmp Ds] Rash and Other (See Comments)    High fever, Burning skin  . Ampicillin Itching  . Fentanyl And Related Hives    Per Dr. Marcell Barlow, pt reports she is ok with fentanyl (09/10/12). Patient states it was epidural form of Fentanyl that she reacted to.   . Neosporin [Neomycin-Bacitracin Zn-Polymyx] Swelling and Other (See Comments)    opthalmic solution only can use topically. Swelling and inflamed eyes    Patient Measurements: Height: 5\' 7"  (170.2 cm) Weight: 134 lb 1.6 oz (60.827 kg) IBW/kg (Calculated) : 61.6  Vital Signs: Temp: 98.3 F (36.8 C) (10/08 0528) Temp Source: Oral (10/08 0528) BP: 115/83 mmHg (10/08 0528) Pulse Rate: 68 (10/08 0528)  Labs:  Recent Labs  01/28/14 2247 01/29/14 0423  HGB 14.3 12.1  HCT 41.6 36.2  PLT 446* 395  CREATININE 2.21* 1.92*    Estimated Creatinine Clearance: 27.7 ml/min (by C-G formula based on Cr of 1.92).   Medical History: Past Medical History  Diagnosis Date  . Acute venous embolism and thrombosis of unspecified deep vessels of lower extremity   . Bronchitis     several times  . Abdominal or pelvic swelling, mass or lump, unspecified site   . Phlebitis and thrombophlebitis of unspecified site   . Pelvic mass   . GERD (gastroesophageal reflux disease)   . PONV (postoperative nausea and vomiting)     "when fentanyl is used, will break out in itchy rast"  . CHF (congestive heart failure)     "just since 11/2012; they think it's from the chemo" (01/21/2013)  . 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 DVT (deep venous thrombosis) 04/2012    "BLE" (01/21/2013)  . Chronic bronchitis     "I had it several times; I used to smoke" (01/21/2013)  . Shortness of breath     "trouble taking a deep breath at times before RX  started; I'm fine now" (01/21/2013)  . Anemia     "because of the chemo" (01/21/2013)  . Arthritis     "little in my right hand" (01/21/2013)  . Anxiety     "related to ovarian cancer; don't take RX for it" (01/21/2013)  . Basal cell carcinoma of face   . 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 Oncologist discontinued coumadin a few days ago d/t progressive metastatic cancer involving the liver. Pt had been receiving heparin 5000 units subq TID for VTE prophylaxis. Pharmacy consulted to dose Lovenox for VTE prophylaxis.  Patient noted to be in acute renal failure.  SCr 1.92 CrCl < 45ml/min (CG)   Goal of Therapy:  Appropriate VTE prophylaxis for renal function. Monitor platelets by anticoagulation protocol: Yes   Plan:  Start Lovenox 30mg  subq Q24H F/u renal function  Kizzie Furnish, PharmD Pager: [phone] 01/29/2014 1:15 PM

## 2014-01-29 NOTE — Progress Notes (Signed)
01/29/2014, 12:19 PM  Hospital day: 1 Antibiotics: none Chemotherapy: last oral etoposide 01-14-14, discontinued that day   Physicians:P.Clayton Bibles, MD ([facility]P [facility] [place], Daneen Schick, [place] [facility] service letting me know of admission last PM. I have discussed on unit with Dr Billey Chang and with Dr Janece Canterbury. Patient seen, friend Clair Gulling not here presently. I have known her since she was diagnosed with advanced gyn carcinoma in 05-2012, with history as summarized below. She has actually done remarkably well despite very complicated course up until last several weeks, with rapid progression of extensively metastatic disease to lung, liver, pelvis and rapidly accumulating chylous ascites. I had seen her in office on 01-28-14, with Hospice referral made then, patient preferring to go home rather than be admitted for symptom control as Hospice was to see her at home on 01-29-14 AM. Unfortunately she became more symptomatic with abdominal cramping and some vomiting, requiring admission thru ED early this AM.  Code status is DNR at patient's request.  Su Grand is HCPOA. 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 She has IVC filter. Coumadin was DCd last week due to progressive disease and risk > benefit now.  Subjective: Cramping abdominal pain intermittently, no vomiting this AM. Taking sips of clear liquids. No BM. Not more SOB now. Slept a little.     ONCOLOGIC HISTORY Patient developed abdominal symptoms in May 2013, saw GI Aug 2013, but was unable to tolerate prep for colonoscopy. She developed LLE superficial phlebitis and swelling in Jan 2014 , with acute thrombus left common femoral vein. CT AP 05-29-2012 had extensive ascites, small pleural effusions, focal PE, masses near dome of liver and  omentum, complex mass 12.8 x 6.6 cm in pelvis, extensive pelvic DVT with probable chronic thrombus in IVC, multiple small retroperitoneal nodes, left inguinal node 1.8 x 1.6 cm and right inguinal node 2.2 x 1.5 cm. She was begun on lovenox and transitioned to Xarelto by PCP. CA 125 was 569. She saw Dr Alycia Rossetti on 06-12-12, with 15 cm fixed pelvic mass. US paracentesis by IR 06-12-12 for 2.8 liters had cytology ( NZB14 - 126) positive for adenocarcinoma. Neoadjuvant dose dense carbo/taxol was given x 3 cycles 06-21-12 thru 08-16-12, with good partial response by CT 08-19-12 and CA 125 down from 1041 in Feb 2014 to 92 on 08-21-12. IVC filter placed prior to exploratory laparotomy with extensive lysis of adhesions, left ureteral lysis, and bilateral salpingo-oophorectomy by Dr Alycia Rossetti on 09-10-12, suboptimal debulking. Operative findings:diffuse carcinomatosis of the residual omentum involving the entire transverse colon. Transverse colon densely adherent to the anterior abdominal wall. No disease throughout the small bowel. Mesenteric adenopathy with lymph nodes measuring 1 cm, involving the root of the small bowel mesentery. ~5 centimeter right ovarian mass and ~ 6 cm left ovarian mass. Small bowel densely adherent to the pelvis. 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 Chemotherapy resumed with dose dense taxol carboplatin on 09-27-12, completing cycle 5 on 11-08-12. CA 125 increased from 92 in April to 130 post op in June, then 225 late July and repeat 318. CT  CAP done 11-19-12 showed increase in nodular thickening along falciform ligament and small peritoneal nodule right iliac fossa. Plan was to change in  chemotherapy to doxil with avastin, however echocardiogram 11-27-12 unexpectedly had EF of 15%. She received one cycle of gemzar on 12-02-12, given peripherally, then had embolic CVAs on 5-57-32 while still on xarelto, with bleeding into the embolic CVA , and treated with heparin transitioned to coumadin. She was stable to resume gemzar with cycle 2 at 800 mg/m2 given on 12-30-12,then platelets down to 70k by day 8. She had progressive superficial thrombophlebitis LE and was changed to full dose lovenox then; there was difficulty getting lovenox covered by insurance. Cycle 3 gemzar 01-13-13 was dose reduced to 600 mg/m2, however platelets still dropped to 90k; she had recurrent embolic CVAs on 05-26-52 and was hospitalized thru 02-26-13, treated with continuous heparin transitioned back to coumadin, plus baby ASA daily. Gemzar was subsequently tolerated well at 500 mg/m2, without significant thrombocytopenia, total 10 cycles given thru 04-28-13. She progressed in liver and nodes by CT 05-13-13. She wanted to continue treatment, with Alimta chosen due to comorbidities, begun 05-22-13. Platelets were 43k by day 9 cycle 1; neulasta was given day 8. Cycle 2 Alimta was dose reduced by 25% due to platelet nadir. CA 125 improved from >3000 at start of Alimta to 205 in late March prior to cycle 4; marker began increasing with cycle 6, up to 1214 by 10-06-13. Cycle 6 Alimta was 09-19-13. Repeat CT AP 10-10-13 confirmed progression in liver, mesenteric nodules and pelvis, with new small area in spleen and 2 mm nodule right pleura. She began oral etoposide on 10-14-13, taken x 3.5 cycles thru 01-14-14 when this was [facility] due to rapidly progressive disease. Coumadin was stopped early October also due to progression of disease including liver and deterioration in overall status.    Objective: Vital signs in last 24 hours: Blood pressure 115/83, pulse 68, temperature 98.3 F (36.8 C), temperature source Oral, resp. rate 18, height 5\' 7"   (1.702 m), weight 134 lb 1.6 oz (60.827 kg), SpO2 91.00%. Looks more comfortable than in office yesterday seated in bed with IVF at [facility] via LUE peripheral IV. Respirations not labored now, occasional hiccoughs. Speech fluent and appropriate. PERRL, not icteric. Oral mucosa moist and clear. No JVD upright. Lungs without wheezes or rales. Heart RRR, no gallop. Abdomen full and distended, tho not as markedly distended as prior to paracentesis last week. Quiet, not tender to gentle exam. Le no edema, cords, tenderness, with bilateral prominent varicosities as is baseline. Moves all extremities equally. Feet warm.   Intake/Output from previous day: 02-27-23 0701 - 10/08 0700 In: 2175 [I.V.:2175] Out: 100 [Urine:100] Intake/Output this shift: Total I/O In: 440 [P.O.:240; I.V.:200] Out: 250 [Urine:250]  Physical exam:  Lab Results:  Recent Labs  02/26/2014 2247 01/29/14 0423  WBC 17.4* 15.3*  HGB 14.3 12.1  HCT 41.6 36.2  PLT 446* 395   BMET  Recent Labs  02-26-2014 2247 01/29/14 0423  NA 130* 129*  K 4.3 4.2  CL 92* 95*  CO2 18* 19  GLUCOSE 147* 128*  BUN 64* 64*  CREATININE 2.21* 1.92*  CALCIUM 10.2 8.9    Studies/Results: Dg Abd 2 Views  02-26-14   CLINICAL DATA:  Abdominal pain, distention and vomiting. History of ovarian cancer.  EXAM: ABDOMEN - 2 VIEW  COMPARISON:  Abdominal ultrasound 01/23/2014. CT abdomen and pelvis 01/19/2014.  FINDINGS: No free intraperitoneal air is identified. The bowel gas pattern is  nonobstructive. Increased density of the abdomen and centralization of bowel loops is compatible with ascites as seen on the comparison examinations. IVC filter is in place. No focal bony abnormality is identified.  IMPRESSION: Negative for free air or bowel obstruction.  Findings compatible with ascites.   Electronically Signed   By: Inge Rise M.D.   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 She is not candidate for any further aggressive treatment, but needs symptom control. Agree with repeat paracentesis. Would change meds to IV as possible. Symptoms are consistent with at least partial gastric outlet obstruction, so would keep clear liquids at minimum. NG probably not really necessary unless vomiting too severe. If she is able to leave hospital, either will need close attention from Hospice, or please consider [facility]. 2.multiple embolic CVAs fall 1610 with subsequent bleeds, and recurrent episodes of superficial thrombophlebitis + previous DVT. IVC filter in. Risks of coumadin now outweigh benefit so that this was DCd last week. ASA ok if able to take po's and if no bleeding.  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 This has not been symptomatic, remarkably so.  4.DNR patient's request. Chauncey Reading is friend Clair Gulling. Clair Gulling will need assistance at home as she declines, or she will need Materials engineer.  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 No pain now that I can tell related to this and not candidate for stent with rest of situation  8. DNR patient's request. Cyd Silence is HCPOA.  9.flu vaccine done  I will see her in AM. Please call if needed otherwise  Gordy Levan MD Pager [phone]

## 2014-01-29 NOTE — ED Provider Notes (Signed)
CSN: [id number]     Arrival date & time 01/28/14  2127 History   First MD Initiated Contact with Patient 01/28/14 2148     Chief Complaint  Patient presents with  . Abdominal Pain      The history is provided by the patient.   patient has advanced end-stage ovarian cancer.  She presents with persistent nausea vomiting.  No hematemesis.  No significant abdominal pain.  She reports decreased oral intake over the past several days.  No fevers or chills.  Symptoms are moderate to severe in severity.     Past Medical History  Diagnosis Date  . Acute venous embolism and thrombosis of unspecified deep vessels of lower extremity   . Bronchitis     several times  . Abdominal or pelvic swelling, mass or lump, unspecified site   . Phlebitis and thrombophlebitis of unspecified site   . Pelvic mass   . GERD (gastroesophageal reflux disease)   . PONV (postoperative nausea and vomiting)     "when fentanyl is used, will break out in itchy rast"  . CHF (congestive heart failure)     "just since 11/2012; they think it's from the chemo" (01/21/2013)  . 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 DVT (deep venous thrombosis) 04/2012    "BLE" (01/21/2013)  . Chronic bronchitis     "I had it several times; I used to smoke" (01/21/2013)  . Shortness of breath     "trouble taking a deep breath at times before RX started; I'm fine now" (01/21/2013)  . Anemia     "because of the chemo" (01/21/2013)  . Arthritis     "little in my right hand" (01/21/2013)  . Anxiety     "related to ovarian cancer; don't take RX for it" (01/21/2013)  . Basal cell carcinoma of face   . Ovarian cancer dx'd 2013   Past Surgical History  Procedure Laterality Date  . Appendectomy  1966    ruptured, hospital for 3 weeks  . Rhinoplasty  1976  . Vein ligation and stripping Right 1998    leg  . Mohs surgery  2004, 2005    basal cell of the face  . Tooth extraction  03/2011?    "just  one; had a root canal that got infected" (01/21/2013)  . Laparotomy N/A 09/10/2012    Procedure: EXPLORATORY LAPAROTOMY, BILATERAL SALPINGO OOPHORECTOMY, TUMOR DEBULKING;  Surgeon: Imagene Gurney A. Alycia Rossetti, MD;  Location: [facility];  Service: Gynecology;  Laterality: N/A;  . Exploratory laparotomy  09/10/12    Lysis of adhesions, BSO, suboptimal tumor debulking  . Vena cava filter placement  08/2012   Family History  Problem Relation Age of Onset  . Pneumonia Mother   . Heart attack Father   . Cancer Neg Hx    History  Substance Use Topics  . Smoking status: Former Smoker -- 30 years    Types: Cigarettes    Quit date: 04/20/2012  . Smokeless tobacco: Never Used  . Alcohol Use: Yes     Comment: 01/21/2013 "long time ago I was a social drinker"   OB History   Grav Para Term Preterm Abortions TAB SAB Ect Mult Living                 Review of Systems  Gastrointestinal: Positive for abdominal pain.  All other systems reviewed and are negative.     Allergies  Septra; Ampicillin; Fentanyl and related;  and Neosporin  Home Medications   Prior to Admission medications   Medication Sig Start Date End Date Taking? Authorizing Provider  acetaminophen (TYLENOL) 500 MG tablet Take 500 mg by mouth every 6 (six) hours as needed for mild pain.   Yes Historical Provider, MD  aspirin 81 MG tablet Take 81 mg by mouth daily.  04/18/13  Yes Delfina Redwood, MD  hyoscyamine ([name]) 0.125 MG tablet Take one tablet by mouth daily as needed for stomach spasms. 01/08/14  Yes Lennis Marion Downer, MD  lisinopril (PRINIVIL,ZESTRIL) 2.5 MG tablet Take 1 tablet (2.5 mg total) by mouth daily. 10/14/13  Yes Belva Crome III, MD  metoprolol succinate (TOPROL-XL) 25 MG 24 hr tablet Take 0.5 tablets (12.5 mg total) by mouth 3 (three) times daily. 12/26/13  Yes Belva Crome III, MD  ondansetron (ZOFRAN) 8 MG tablet Take 1 tablet (8 mg total) by mouth every 12 (twelve) hours as needed for nausea or vomiting. 10/27/13  Yes Lennis  Marion Downer, MD  pantoprazole (PROTONIX) 40 MG tablet Take 1 tablet (40 mg total) by mouth daily. 11/26/13  Yes Lennis Marion Downer, MD  sennosides-docusate sodium (SENOKOT-S) 8.6-50 MG tablet Take 1-2 tablets by mouth daily as needed for constipation.   Yes Historical Provider, MD  spironolactone (ALDACTONE) 25 MG tablet TAKE 1 TABLET BY MOUTH EVERY DAY 01/12/14  Yes Belva Crome III, MD  spironolactone (ALDACTONE) 25 MG tablet Take 25 mg by mouth daily.   Yes Historical Provider, MD  triamcinolone (KENALOG) 0.1 % paste Apply to mouth ulcers bid prn 12/09/13  Yes Lennis P Livesay, MD   BP 109/72  Pulse 107  Temp(Src) 98.4 F (36.9 C) (Oral)  Resp 18  SpO2 98% Physical Exam  Nursing note and vitals reviewed. Constitutional: She is oriented to person, place, and time. She appears well-developed and well-nourished. No distress.  HENT:  Head: Normocephalic and atraumatic.  Eyes: EOM are normal.  Neck: Normal range of motion.  Cardiovascular: Regular rhythm and normal heart sounds.   tachycardiac  Pulmonary/Chest: Effort normal and breath sounds normal.  Abdominal: Soft. She exhibits no distension. There is no tenderness.  Musculoskeletal: Normal range of motion.  Neurological: She is alert and oriented to person, place, and time.  Skin: Skin is warm and dry.  Psychiatric: She has a normal mood and affect. Judgment normal.    ED Course  Procedures (including critical care time) Labs Review Labs Reviewed  CBC WITH DIFFERENTIAL - Abnormal; Notable for the following:    WBC 17.4 (*)    RDW 18.3 (*)    Platelets 446 (*)    Neutrophils Relative % 82 (*)    Neutro Abs 14.3 (*)    Lymphocytes Relative 8 (*)    Monocytes Absolute 1.7 (*)    All other components within normal limits  COMPREHENSIVE METABOLIC PANEL - Abnormal; Notable for the following:    Sodium 130 (*)    Chloride 92 (*)    CO2 18 (*)    Glucose, Bld 147 (*)    BUN 64 (*)    Creatinine, Ser 2.21 (*)    Albumin 3.0 (*)     Alkaline Phosphatase 138 (*)    GFR calc non Af Amer 22 (*)    GFR calc Af Amer 26 (*)    Anion gap 20 (*)    All other components within normal limits  LIPASE, BLOOD    Imaging Review Dg Abd 2 Views  01/28/2014  CLINICAL DATA:  Abdominal pain, distention and vomiting. History of ovarian cancer.  EXAM: ABDOMEN - 2 VIEW  COMPARISON:  Abdominal ultrasound 01/23/2014. CT abdomen and pelvis 01/19/2014.  FINDINGS: No free intraperitoneal air is identified. The bowel gas pattern is nonobstructive. Increased density of the abdomen and centralization of bowel loops is compatible with ascites as seen on the comparison examinations. IVC filter is in place. No focal bony abnormality is identified.  IMPRESSION: Negative for free air or bowel obstruction.  Findings compatible with ascites.   Electronically Signed   By: Inge Rise M.D.   On: 01/28/2014 22:48     EKG Interpretation None      MDM   Final diagnoses:  Non-intractable vomiting with nausea, vomiting of unspecified type  Renal failure   Is to be admitted for acute renal failure and dehydration.  Improvement in nausea and vomiting the emergency department.    Hoy Morn, MD 01/29/14 [phone]

## 2014-01-29 NOTE — Consult Note (Signed)
Patient OB:[name]      DOB: [birth year]      ZMO:[id number]     Consult Note from the Palliative Medicine Team at [place] Requested by:  Dr. Sheran Fava     PCP: Monique Inches, MD Reason for Consultation: [place]    Phone Number:[phone] And Symptom managment Assessment of patients Current state: Patient is a 68 yr old white female with a past medical history for ovarian cancer.  She was told last week that her disease has persisted and that she no longer is eligible for treatment.  She has had persistent nausea and vomiting for the last week and is too weak to get to the bathroom. She lives with her significant other Monique [name] who is her POA . She has been suffering terribly and at this time is having a hard admitting that her disease is progressing and that she is dying. Please see my note from the day of service.  Monique [name] and I made a connection surrounding her need for symptom management.  I was able to get her to start to think about having hospice help her with this last part of her journey.  She said she would talk with Monique [name] about it.  She agreed to let me start some aggressive symptom management for her retching and vomiting which reflects her partial obstruction.  We agreed to have my team follow up the next day for further discussions and to try to get her more comfortable.   Goals of Care: 1.  Code Status: DNR affirmed   2. Scope of Treatment: Monique [name] agrees to aggressive symptom management with consideration for further discussions related to possible transition to the hospice home.  4. Disposition: to be determined, considering hospice facility vs home with hospice   3. Symptom Management:   1. Anxiety/Agitation: Monique [name] hated how the ativan made her feel. Will dc it. 2. Pain: Roxanol sublingual available. She refuses to take it.  Hopefully, with the addition of octreotide and steroids her pain will be less.  Will reasses early in the am. 3. Offered to have the  hospice representative of her choice come to talk more about what hospice would look like.  4. Psychosocial: Has a [place] who is the love of her life and represents a bond between [name] and she. Monique [name] has been her dedicated significant other for some time and is her POA.  Monique [name] was a Marine scientist .  5. Spiritual:  Not important to Monique [name].  She states she does not find comfort in spiritual matters.  Will respect her wishes.        Patient Documents Completed or Given: Document Given Completed  Advanced Directives Pkt    MOST    DNR    Gone from My Sight    Hard Choices      Brief HPI: 68 yr old with metastatic ovarian cancer.  Admitted with partial if not full obstruction presenting with nausea and vomiting.   ROS: nausea, vomiting crampy abdominal pain.    PMH:  Past Medical History  Diagnosis Date  . Acute venous embolism and thrombosis of unspecified deep vessels of lower extremity   . Bronchitis     several times  . Abdominal or pelvic swelling, mass or lump, unspecified site   . Phlebitis and thrombophlebitis of unspecified site   . Pelvic mass   . GERD (gastroesophageal reflux disease)   . PONV (postoperative nausea and vomiting)     "when fentanyl is  used, will break out in itchy rast"  . CHF (congestive heart failure)     "just since 11/2012; they think it's from the chemo" (01/21/2013)  . 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 DVT (deep venous thrombosis) 04/2012    "BLE" (01/21/2013)  . Chronic bronchitis     "I had it several times; I used to smoke" (01/21/2013)  . Shortness of breath     "trouble taking a deep breath at times before RX started; I'm fine now" (01/21/2013)  . Anemia     "because of the chemo" (01/21/2013)  . Arthritis     "little in my right hand" (01/21/2013)  . Anxiety     "related to ovarian cancer; don't take RX for it" (01/21/2013)  . Basal cell carcinoma of face   . Ovarian cancer dx'd 2013      PSH: Past Surgical History  Procedure Laterality Date  . Appendectomy  1966    ruptured, hospital for 3 weeks  . Rhinoplasty  1976  . Vein ligation and stripping Right 1998    leg  . Mohs surgery  2004, 2005    basal cell of the face  . Tooth extraction  03/2011?    "just one; had a root canal that got infected" (01/21/2013)  . Laparotomy N/A 09/10/2012    Procedure: EXPLORATORY LAPAROTOMY, BILATERAL SALPINGO OOPHORECTOMY, TUMOR DEBULKING;  Surgeon: Imagene Gurney A. Alycia Rossetti, MD;  Location: [facility];  Service: Gynecology;  Laterality: N/A;  . Exploratory laparotomy  09/10/12    Lysis of adhesions, BSO, suboptimal tumor debulking  . Vena cava filter placement  08/2012   I have reviewed the [place] and [facility] and  If appropriate update it with new information. Allergies  Allergen Reactions  . Septra [Sulfamethoxazole-Tmp Ds] Rash and Other (See Comments)    High fever, Burning skin  . Ampicillin Itching  . Fentanyl And Related Hives    Per Dr. Marcell Barlow, pt reports she is ok with fentanyl (09/10/12). Patient states it was epidural form of Fentanyl that she reacted to.   . Neosporin [Neomycin-Bacitracin Zn-Polymyx] Swelling and Other (See Comments)    opthalmic solution only can use topically. Swelling and inflamed eyes   Scheduled Meds: . dexamethasone  4 mg Intravenous 3 times per day  . feeding supplement (RESOURCE BREEZE)  1 Container Oral BID BM   Continuous Infusions: . sodium chloride 50 mL/hr at 01/29/14 0230  . octreotide (SANDOSTATIN) infusion     PRN Meds:.acetaminophen, acetaminophen, antiseptic oral rinse, bisacodyl, metoCLOPramide (REGLAN) injection, morphine injection, morphine CONCENTRATE, ondansetron (ZOFRAN) IV, ondansetron    BP 110/61  Pulse 68  Temp(Src) 98.3 F (36.8 C) (Oral)  Resp 18  Ht 5\' 7"  (1.702 m)  Wt 60.827 kg (134 lb 1.6 oz)  BMI 21.00 kg/m2  SpO2 91%   PPS: 10-20%   Intake/Output Summary (Last 24 hours) at 01/29/14 1609 Last data filed at 01/29/14  1000  Gross per 24 hour  Intake   2615 ml  Output    350 ml  Net   2265 ml     Physical Exam:  General: pale, miserably retching and bringing up mouthfuls of brown bile HEENT:  suken eyes, PERRL, EOMI, aniciteric Chest:   Decreased but clear CVS: regular, S1, S2   Abdomen:firm, tender diffusely, no guarding, positive rapid bowel sounds Ext: thin, pale, cold Neuro:awake alert oriented , miserable.  Labs: CBC    Component Value Date/Time  WBC 15.3* 01/29/2014 0423   WBC 6.2 01/23/2014 1405   RBC 4.05 01/29/2014 0423   RBC 3.83 01/23/2014 1405   HGB 12.1 01/29/2014 0423   HGB 11.7 01/23/2014 1405   HCT 36.2 01/29/2014 0423   HCT 35.5 01/23/2014 1405   PLT 395 01/29/2014 0423   PLT 249 01/23/2014 1405   MCV 89.4 01/29/2014 0423   MCV 92.7 01/23/2014 1405   MCH 29.9 01/29/2014 0423   MCH 30.5 01/23/2014 1405   MCHC 33.4 01/29/2014 0423   MCHC 33.0 01/23/2014 1405   RDW 18.2* 01/29/2014 0423   RDW 18.9* 01/23/2014 1405   LYMPHSABS 1.4 01/28/2014 2247   LYMPHSABS 0.6* 01/23/2014 1405   MONOABS 1.7* 01/28/2014 2247   MONOABS 1.0* 01/23/2014 1405   EOSABS 0.0 01/28/2014 2247   EOSABS 0.0 01/23/2014 1405   BASOSABS 0.0 01/28/2014 2247   BASOSABS 0.0 01/23/2014 1405       CMP     Component Value Date/Time   NA 129* 01/29/2014 0423   NA 135* 01/23/2014 1407   K 4.2 01/29/2014 0423   K 4.4 01/23/2014 1407   CL 95* 01/29/2014 0423   CL 104 10/15/2012 1043   CO2 19 01/29/2014 0423   CO2 25 01/23/2014 1407   GLUCOSE 128* 01/29/2014 0423   GLUCOSE 94 01/23/2014 1407   GLUCOSE 114* 10/15/2012 1043   BUN 64* 01/29/2014 0423   BUN 26.2* 01/23/2014 1407   CREATININE 1.92* 01/29/2014 0423   CREATININE 1.1 01/23/2014 1407   CALCIUM 8.9 01/29/2014 0423   CALCIUM 10.3 01/23/2014 1407   PROT 8.0 01/28/2014 2247   PROT 7.4 01/23/2014 1407   ALBUMIN 3.0* 01/28/2014 2247   ALBUMIN 3.2* 01/23/2014 1407   AST 19 01/28/2014 2247   AST 34 01/23/2014 1407   ALT 12 01/28/2014 2247   ALT 17 01/23/2014 1407   ALKPHOS 138*  01/28/2014 2247   ALKPHOS 153* 01/23/2014 1407   BILITOT 0.4 01/28/2014 2247   BILITOT 0.58 01/23/2014 1407   GFRNONAA 26* 01/29/2014 0423   GFRAA 30* 01/29/2014 0423     CT scan of the abdomen Reviewed/Impressions: 1. Evidence of progressive pulmonary metastasis with multiple new nodules in both lung bases. 2. Progression of peritoneal disease. The dominant mass in the left side of pelvis has increased in size from previous exam. Interval development of massive abdominal and pelvic ascites which is partially loculated. 3. Mixed response with respect to the multiple liver metastasis. Some of the previously noted liver lesions have decreased or resolved in the interval. Others have increased in size. 4. Findings concerning for high-grade partial obstruction perhaps due to adhesive disease or peritoneal 5. Right-sided hydronephrosis. New from previous exam.   ABd: Negative for free air or bowel obstruction.  Findings compatible with ascites.   Time In Time Out Total Time Spent with Patient Total Overall Time  305 pm 424 pm 69 min 69 min    Greater than 50%  of this time was spent counseling and coordinating care related to the above assessment and plan.  [name] Roper L. Lovena Le, MD MBA The Palliative Medicine Team at [facility] Phone: [phone] Pager: [phone] ( Use team phone after hours)

## 2014-01-29 NOTE — Progress Notes (Signed)
Patient ID: Monique [name], female   DOB: 1945-09-21, 68 y.o.   MRN: [id number] Pt presented to [place] dept today for therapeutic paracentesis. Limited US abd in all four quadrants reveals only small amt ascites in perihepatic region but none in pelvic gutters. Procedure was cancelled. Dr. Lynnea Ferrier aware.

## 2014-01-30 DIAGNOSIS — C569 Malignant neoplasm of unspecified ovary: Secondary | ICD-10-CM

## 2014-01-30 DIAGNOSIS — R1110 Vomiting, unspecified: Secondary | ICD-10-CM

## 2014-01-30 DIAGNOSIS — J449 Chronic obstructive pulmonary disease, unspecified: Secondary | ICD-10-CM

## 2014-01-30 MED ORDER — SODIUM CHLORIDE 0.9 % IV SOLN
INTRAVENOUS | Status: DC
Start: 2014-01-30 — End: 2014-02-01
  Administered 2014-01-30 – 2014-01-31 (×2): via INTRAVENOUS

## 2014-01-30 NOTE — Progress Notes (Signed)
[facility] PROGRESS NOTE  Monique [name] PJK:[id number] DOB: [birth year] DOA: 01/28/2014 PCP: Phineas Inches, MD  Assessment/Plan  Nausea vomiting, probably related to patient's metastatic cancer with possible bowel obstruction -  Continue CLD as tolerated -  Continue slow IVF  -  Continue zofran -  Continue reglan for prn nausea  -  Continue octreotide and dexamethasone -  Sublingual or IV medications for now  Acute renal failure with metabolic acidosis, likely prerenal and improved somewhat with hydration already.  CAT scan done last week did showed new right hydronephrosis, however, her prognosis is limited and she is not a good candidate for stent placement -  Continue IVF as above  Increasing abdominal pain with malignant Ascites, last paracentesis on 10/2, -  Minimal ascites on [place] -   Continue sublingual morphine for mod to severe pain with IV if she is having too much nausea and vomiting  Metastatic ovarian carcinoma - progressive disease.  -  Plan for inpatient hospice - SW consult placed -  Appreciate Dr. Mariana Kaufman and Dr. 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 -  Patient's oncologist discontinued Coumadin few days ago   Severe protein calorie malnutrition due to progressive malignancy -  Appreciate nutrition assistance -  Advance diet as tolerated and supplements  Leukocytosis, likely related to dehydration and progressive cancer.  No evidence of pneumonia clinically  Hyponatremia likely due to liver mets, ascities   Diet:  CLD Access:  PIV IVF:  yes Proph:  SCDs  Code Status: DNR Family Communication: patient alone Disposition Plan:  Inpatient hospice in 1-2 days   Consultants:  Palliative care  Dr. Marko Plume  Procedures:    10/7 KUB:  No free air or bowel  obstruction  Antibiotics:  None   HPI/Subjective:  Slept much better last night.  Nausea and abdominal cramps have improved some.    Objective: Filed Vitals:   01/29/14 1410 01/29/14 2200 01/30/14 0512 01/30/14 1316  BP: 110/61 81/45 91/49  116/71  Pulse:  91 98 107  Temp:  98.3 F (36.8 C) 98 F (36.7 C) 96.8 F (36 C)  TempSrc:  Oral Oral Axillary  Resp:  20 16 18   Height:      Weight:      SpO2:  97% 98% 100%    Intake/Output Summary (Last 24 hours) at 01/30/14 1820 Last data filed at 01/30/14 1200  Gross per 24 hour  Intake 969.17 ml  Output    575 ml  Net 394.17 ml   Filed Weights   01/29/14 0132  Weight: 60.827 kg (134 lb 1.6 oz)    Exam:   General:  Cachectic WF, No acute distress  HEENT:  NCAT, MMM  Cardiovascular:  tachycardic RR, nl S1, S2 no mrg, 2+ pulses, warm extremities  Respiratory:  CTAB, no increased WOB  Abdomen:   hypoactive BS, soft, mildly distended, firm feeling and somewhat nodular, nontender  MSK:   Normal tone and bulk, no LEE  Neuro:  Grossly intact  Data Reviewed: Basic Metabolic Panel:  Recent Labs Lab 01/28/14 2247 01/29/14 0423  NA 130* 129*  K 4.3 4.2  CL 92* 95*  CO2 18* 19  GLUCOSE 147* 128*  BUN 64* 64*  CREATININE 2.21* 1.92*  CALCIUM 10.2 8.9   Liver Function Tests:  Recent Labs Lab 01/28/14 2247  AST  19  ALT 12  ALKPHOS 138*  BILITOT 0.4  PROT 8.0  ALBUMIN 3.0*    Recent Labs Lab 01/28/14 2247  LIPASE 32   No results found for this basename: AMMONIA,  in the last 168 hours CBC:  Recent Labs Lab 01/28/14 2247 01/29/14 0423  WBC 17.4* 15.3*  NEUTROABS 14.3*  --   HGB 14.3 12.1  HCT 41.6 36.2  MCV 88.9 89.4  PLT 446* 395   Cardiac Enzymes: No results found for this basename: CKTOTAL, CKMB, CKMBINDEX, TROPONINI,  in the last 168 hours BNP (last 3 results) No results found for this basename: PROBNP,  in the last 8760 hours CBG: No results found for this basename: GLUCAP,  in the  last 168 hours  No results found for this or any previous visit (from the past 240 hour(s)).   Studies: US Abdomen Limited  01/29/2014   CLINICAL DATA:  Ovarian carcinoma, ascites, assessment for paracentesis  EXAM: LIMITED ABDOMEN ULTRASOUND FOR ASCITES  TECHNIQUE: Limited ultrasound survey for ascites was performed in all four abdominal quadrants.  COMPARISON:  01/23/2014  FINDINGS: Small amounts of ascites are seen in the lower quadrants bilaterally.  Minimal LEFT upper quadrant fluid.  Greatest collection of ascites is seen perihepatic.  Volume and distribution of ascites is insufficient for safe paracentesis at this time.  IMPRESSION: Scattered ascites, most prominent in perihepatic region.  Paracentesis not performed.   Electronically Signed   By: Lavonia Dana M.D.   On: 01/29/2014 14:43   Dg Abd 2 Views  01/28/2014   CLINICAL DATA:  Abdominal pain, distention and vomiting. History of ovarian cancer.  EXAM: ABDOMEN - 2 VIEW  COMPARISON:  Abdominal ultrasound 01/23/2014. CT abdomen and pelvis 01/19/2014.  FINDINGS: No free intraperitoneal air is identified. The bowel gas pattern is nonobstructive. Increased density of the abdomen and centralization of bowel loops is compatible with ascites as seen on the comparison examinations. IVC filter is in place. No focal bony abnormality is identified.  IMPRESSION: Negative for free air or bowel obstruction.  Findings compatible with ascites.   Electronically Signed   By: Inge Rise M.D.   On: 01/28/2014 22:48    Scheduled Meds: . dexamethasone  4 mg Intravenous 3 times per day  . feeding supplement (RESOURCE BREEZE)  1 Container Oral BID BM  . ondansetron (ZOFRAN) IV  8 mg Intravenous 4 times per day  . pantoprazole (PROTONIX) IV  40 mg Intravenous Q12H   Continuous Infusions: . sodium chloride    . octreotide (SANDOSTATIN) infusion 25 mcg/hr (01/30/14 1417)    Principal Problem:   Nausea with vomiting Active Problems:   Chronic systolic  heart failure   Acute renal failure   Ascites   Nausea & vomiting   Protein-calorie malnutrition, severe    Time spent: 30 min    Giordano Getman, [facility] Pager [phone]. If 7PM-7AM, please contact night-coverage at [email or web address], password [facility] 01/30/2014, 6:20 PM  LOS: 2 days

## 2014-01-30 NOTE — Progress Notes (Signed)
Patient gags frequently but denies being nauseous,spits up thick saliva,refused to take phenergan or morphine, only accepting reglan and zofran.

## 2014-01-30 NOTE — Progress Notes (Signed)
MEDICAL ONCOLOGY 01/30/2014, 8:32 AM  [name] day 2 Antibiotics: none Chemotherapy: oral etoposide DCd 01-14-14   Outpatient Physicians:P.Clayton Bibles, MD ([facility])/ [place], Monique [name], Antony Contras     Patient seen, with Monique [name] at bedside. I also spoke with Dr Sheran Fava, and have reviewed EMR since I saw her yesterday No significant ascites by [place] yesterday.  Subjective: Somewhat more comfortable, actually slept last pm for first time in days. Intermittent vomiting, but less. Still trying small amounts of clear liquids. No BM or flatus, since diarrhea a week ago. Still intermittent cramping in abdomen, especially after any po. She feels present medications are helpful. Talking about her [place], [place].   ONCOLOGIC HISTORY  Patient developed abdominal symptoms in May 2013, saw GI Aug 2013, but was unable to tolerate prep for colonoscopy. She developed LLE superficial phlebitis and swelling in Jan 2014 , with acute thrombus left common femoral vein. 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 She was begun on lovenox and transitioned to Xarelto by PCP. CA 125 was 569. She saw Dr Alycia Rossetti on 06-12-12, with 15 cm fixed pelvic mass. US paracentesis by IR 06-12-12 for 2.8 liters had cytology ( [id number] - 126) positive for adenocarcinoma. Neoadjuvant dose dense carbo/taxol was given x 3 cycles 06-21-12 thru 08-16-12, with good partial response by CT 08-19-12 and CA 125 down from 1041 in Feb 2014 to 92 on 08-21-12. IVC filter placed prior to exploratory laparotomy with extensive lysis of adhesions, left ureteral lysis, and bilateral salpingo-oophorectomy by Dr Alycia Rossetti on 09-10-12, suboptimal debulking.  Operative findings:diffuse carcinomatosis of the residual omentum involving the entire transverse colon. Transverse colon densely adherent to the anterior abdominal wall. No disease throughout the small bowel. Mesenteric adenopathy with lymph nodes measuring 1 cm, involving the root of the small bowel mesentery. ~5 centimeter right ovarian mass and ~ 6 cm left ovarian mass. Small bowel densely adherent to the pelvis. 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 Chemotherapy resumed with dose dense taxol carboplatin on 09-27-12, completing cycle 5 on 11-08-12. CA 125 increased from 92 in April to 130 post op in June, then 225 late July and repeat 318. CT CAP done 11-19-12 showed increase in nodular thickening along falciform ligament and small peritoneal nodule right iliac fossa. Plan was to change in chemotherapy to doxil with avastin, however echocardiogram 11-27-12 unexpectedly had EF of 15%. 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 She had progressive superficial thrombophlebitis LE and was changed to full dose lovenox then; there was difficulty getting lovenox covered by insurance. Cycle 3 gemzar 01-13-13 was dose reduced to 600 mg/m2, however platelets still dropped to 90k; she had recurrent embolic CVAs on 1-91-47 and was hospitalized thru 01-28-13, treated with continuous heparin transitioned back to coumadin, plus baby ASA  daily. Gemzar total 10 cycles given thru 04-28-13. She progressed in liver and nodes by CT 05-13-13. She wanted to  continue treatment, with Alimta chosen due to comorbidities, begun 05-22-13. Platelets were 43k by day 9 cycle 1; neulasta was given day 8. Cycle 2 Alimta was dose reduced by 25% due to platelet nadir. CA 125 improved from >3000 at start of Alimta to 205 in late March prior to cycle 4; marker began increasing with cycle 6, up to 1214 by 10-06-13. Cycle 6 Alimta was 09-19-13. Repeat CT AP 10-10-13 confirmed progression in liver, mesenteric nodules and pelvis, with new small area in spleen and 2 mm nodule right pleura. She began oral etoposide on 10-14-13, taken x 3.5 cycles thru 01-14-14 when this was Monique [name] due to rapidly progressive disease. Coumadin was stopped early October also due to progression of disease including liver and deterioration in overall status.   Objective: Vital signs in last 24 hours: Blood pressure 91/49, pulse 98, temperature 98 F (36.7 C), temperature source Oral, resp. rate 16, height 5\' 7"  (1.702 m), weight 134 lb 1.6 oz (60.827 kg), SpO2 98.00%.   Intake/Output from previous day: 10/08 0701 - 10/09 0700 In: 1649.2 [P.O.:480; I.V.:1169.2] Out: 725 [Urine:725] Intake/Output this shift:    Physical exam: looks more comfortable, some belching, alert and talkative. Mouth clear and not dry. PERRL, not icteric. Lungs without wheezes or rales, respirations not labored RA. Abdomen no more distended, quiet, not tender to gentle exam. Peripheral IV LUE site ok, at 125. LE without pitting edema, cords, tenderness. Moves all extremities easily.  Lab Results: none new  Recent Labs  01/28/14 2247 01/29/14 0423  WBC 17.4* 15.3*  HGB 14.3 12.1  HCT 41.6 36.2  PLT 446* 395   BMET  Recent Labs  01/28/14 2247 01/29/14 0423  NA 130* 129*  K 4.3 4.2  CL 92* 95*  CO2 18* 19  GLUCOSE 147* 128*  BUN 64* 64*  CREATININE 2.21* 1.92*  CALCIUM 10.2 8.9    Studies/Results: US Abdomen Limited  01/29/2014   CLINICAL DATA:  Ovarian carcinoma, ascites, assessment for paracentesis   EXAM: LIMITED ABDOMEN ULTRASOUND FOR ASCITES  TECHNIQUE: Limited ultrasound survey for ascites was performed in all four abdominal quadrants.  COMPARISON:  01/23/2014  FINDINGS: Small amounts of ascites are seen in the lower quadrants bilaterally.  Minimal LEFT upper quadrant fluid.  Greatest collection of ascites is seen perihepatic.  Volume and distribution of ascites is insufficient for safe paracentesis at this time.  IMPRESSION: Scattered ascites, most prominent in perihepatic region.  Paracentesis not performed.   Electronically Signed   By: Lavonia Dana M.D.   On: 01/29/2014 14:43   Dg Abd 2 Views  01/28/2014   CLINICAL DATA:  Abdominal pain, distention and vomiting. History of ovarian cancer.  EXAM: ABDOMEN - 2 VIEW  COMPARISON:  Abdominal ultrasound 01/23/2014. CT abdomen and pelvis 01/19/2014.  FINDINGS: No free intraperitoneal air is identified. The bowel gas pattern is nonobstructive. Increased density of the abdomen and centralization of bowel loops is compatible with ascites as seen on the comparison examinations. IVC filter is in place. No focal bony abnormality is identified.  IMPRESSION: Negative for free air or bowel obstruction.  Findings compatible with ascites.   Electronically Signed   By: Inge Rise M.D.   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 She is not candidate for any further aggressive treatment, but needs symptom control.  Would change meds to IV as possible. Symptoms are consistent with at least partial gastric outlet obstruction, so would keep clear liquids at minimum. NG probably not really necessary unless vomiting too severe. If she is able to leave [name], either will need close attention from Hospice, or please  consider [facility]. Appreciate excellent care by hospitalist service and Dr Lovena Le. 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 IVC filter in. Risks of coumadin now outweigh benefit so that this was DCd last week. ASA ok if able to take po's and if no bleeding.  3.cardiomyopathy: EF 15%, followed by Dr Monique [name]. This has not been symptomatic, remarkably so.  4.DNR patient's request. Monique [name] is friend Monique [name]. Monique [name] will need assistance at home as she declines, or she will need Materials engineer.  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 No pain now that I can tell related to this and not candidate for stent with rest of situation  8. DNR patient's request. Monique [name] is HCPOA.  9.flu vaccine done  Please call if my service is needed over weekend. I will follow up next week.  [place] Office [phone]

## 2014-01-31 NOTE — Progress Notes (Signed)
Clinical Social Work  CSW received a call from [facility] [place]) who reports she met with patient and boyfriend for over an hour. Representative reports patient and family had further questions and were concerned about transferring to hospice home. CSW made MD aware of patient's concerns and will continue to follow.  Sindy Messing, LCSW (Weekend Coverage)

## 2014-01-31 NOTE — Discharge Summary (Addendum)
Physician Discharge Summary  Monique [name] JHE:[id number] DOB: 11/11/1945 DOA: 01/28/2014  PCP: Phineas Inches, MD  Admit date: 01/28/2014 Discharge date: 02/01/2014  Recommendations for Outpatient Follow-up:  1. Transfer to inpatient hospice for ongoing symptom management  Discharge Diagnoses:  Principal Problem:   Intractable nausea and vomiting Active Problems:   Ovarian cancer   Chronic systolic heart failure   Nausea with vomiting   Acute renal failure   Ascites   Protein-calorie malnutrition, severe   Hydronephrosis   Discharge Condition: fair  Diet recommendation: clear liquid  Wt Readings from Last 3 Encounters:  01/29/14 60.827 kg (134 lb 1.6 oz)  01/28/14 59.149 kg (130 lb 6.4 oz)  01/23/14 69.809 kg (153 lb 14.4 oz)    History of present illness:  Monique [name] is a 68 y.o. female with history of metastatic ovarian cancer presented to the ER because of persistent nausea and vomiting. Patient had been having increasing nausea vomiting and was unable to keep in anything.  One week prior to admission, she underwent paracentesis for malignant ascites and CT scan demonstrated high-grade partial small bowel obstruction.  Patient's chemotherapy was stopped by patient's oncologist due to progression of disease.  Patient also had history of embolic CVA but her Coumadin was stopped due to progressive metastatic cancer involving the liver.  Patient's labs demonstrated AKI and she was admitted for dehydration and refractory nausea and vomiting.    Hospital Course:   Metastatic ovarian carcinoma with disease progression despite chemotherapy.  She has extensive liver mets and probable gastric outlet obstruction vs. SBO.  She was seen by palliative care when she had minimal improvement with typical antiemetics and she was started on octreotide and dexamethasone, scheduled zofran with prn reglan which have slowed her vomiting some and allowed her to sleep.  She is tolerating a few sips  of clear liquids only, no solid food, and she frequently regurgitates her food.  She tried phenergan but she did not like the way it made her feel.  Continued PPI and added H2 blocker.  Recommend only sublingual, rectal, or IV/injectable medications if possible due to ongoing regurgitation of bilious material.  She was seen by Dr. Marko Plume from Oncology, Dr. Lovena Le from Palliative care.    Increasing abdominal pain with malignant Ascites, last paracentesis on 10/2.  Repeat US demonstrated minimal ascites so not paracentesis was performed.  Tried sublingual morphine, however, she did not like how it made her feel.   Acute renal failure with metabolic acidosis, likely prerenal and improved somewhat with hydration, although she also has new onset right hydronephrosis probably secondary to ureteral obstruction from malignancy.  She is not a candidate for further intervention.    Cardiomyopathy most likely related to chemotherapy, EF of 15% from ECHO 11/2012.  Avoid lasix to prevent worsening dehydration.  No oral medications at this time.    History of embolic CVA and DVTs - due to progressive metastatic ovarian carcinoma.  Coumadin discontinued.   Severe protein calorie malnutrition due to progressive malignancy.  Clear liquid diet as tolerated.    Leukocytosis, likely related to dehydration and progressive cancer. No evidence of pneumonia clinically.   Hyponatremia likely due to liver mets, ascities.     Consultants:  Palliative care Dr. Lovena Le Dr. Marko Plume Procedures:  10/7 KUB: No free air or bowel obstruction Abd [place]:  Minimal ascites Antibiotics:  None    Discharge Exam: Filed Vitals:   02/01/14 0511  BP: 116/65  Pulse: 97  Temp: 97.4 F (  36.3 C)  Resp: 16   Filed Vitals:   01/31/14 0549 01/31/14 1339 01/31/14 2022 02/01/14 0511  BP: 116/80 113/82 102/48 116/65  Pulse: 103 90 105 97  Temp: 97.8 F (36.6 C) 97.5 F (36.4 C) 98.3 F (36.8 C) 97.4 F (36.3 C)  TempSrc: Oral  Axillary Axillary Axillary  Resp: 20 18 16 16   Height:      Weight:      SpO2: 98% 99% 98% 100%    General: Cachectic WF, No acute distress, slightly confused this morning HEENT: NCAT, MMM  Cardiovascular: tachycardic RR, nl S1, S2 no mrg, 2+ pulses, warm extremities  Respiratory: CTAB, no increased WOB  Abdomen: Hypoactive BS, soft, mildly distended, nontender  MSK: Normal tone and bulk, no LEE  Neuro: Grossly intact   Discharge Instructions      Discharge Instructions   Call MD for:  difficulty breathing, headache or visual disturbances    Complete by:  As directed      Call MD for:  persistant nausea and vomiting    Complete by:  As directed      Call MD for:  severe uncontrolled pain    Complete by:  As directed      Call MD for:  temperature >100.4    Complete by:  As directed      Increase activity slowly    Complete by:  As directed             Medication List    STOP taking these medications       acetaminophen 500 MG tablet  Commonly known as:  TYLENOL     aspirin 81 MG tablet     hyoscyamine 0.125 MG tablet  Commonly known as:  [name]     lisinopril 2.5 MG tablet  Commonly known as:  PRINIVIL,ZESTRIL     metoprolol succinate 25 MG 24 hr tablet  Commonly known as:  TOPROL-XL     ondansetron 8 MG tablet  Commonly known as:  ZOFRAN  Replaced by:  ondansetron 8 MG/50ML Soln     pantoprazole 40 MG tablet  Commonly known as:  PROTONIX  Replaced by:  pantoprazole 40 MG injection     sennosides-docusate sodium 8.6-50 MG tablet  Commonly known as:  SENOKOT-S     spironolactone 25 MG tablet  Commonly known as:  ALDACTONE     triamcinolone 0.1 % paste  Commonly known as:  KENALOG      TAKE these medications       bisacodyl 10 MG suppository  Commonly known as:  DULCOLAX  Place 1 suppository (10 mg total) rectally daily as needed for mild constipation or moderate constipation.     dexamethasone 4 MG/ML injection  Commonly known as:  DECADRON   Inject 1 mL (4 mg total) into the vein every 8 (eight) hours.     feeding supplement (RESOURCE BREEZE) Liqd  Take 1 Container by mouth 2 (two) times daily between meals.     LORazepam 2 MG/ML injection  Commonly known as:  ATIVAN  Inject 0.25 mLs (0.5 mg total) into the vein every 4 (four) hours as needed for anxiety or sedation.     metoCLOPramide 5 MG/ML injection  Commonly known as:  REGLAN  Inject 2 mLs (10 mg total) into the vein every 6 (six) hours as needed.     morphine 2 MG/ML injection  Inject 1 mL (2 mg total) into the vein every 4 (four) hours as needed (inabiliy to  tolerate sublingual morphine for mod to severe pain).     morphine CONCENTRATE 10 mg / 0.5 ml concentrated solution  Place 0.25 mLs (5 mg total) under the tongue every 2 (two) hours as needed for moderate pain, severe pain or shortness of breath.     Octreotide Acetate 200 MCG/ML Soln  Inject 1 mL (200 mcg total) as directed 3 (three) times daily.     ondansetron 8 MG/50ML Soln  Commonly known as:  ZOFRAN  Inject 8 mg into the vein every 6 (six) hours.     pantoprazole 40 MG injection  Commonly known as:  PROTONIX  Inject 40 mg into the vein every 12 (twelve) hours.       Follow-up Information   Follow up with Phineas Inches, MD.   Specialty:  Family Medicine   Contact information:   [id number] [place] [address] [phone]       The results of significant diagnostics from this hospitalization (including imaging, microbiology, ancillary and laboratory) are listed below for reference.    Significant Diagnostic Studies: Ct Abdomen Pelvis W Contrast  01/19/2014   CLINICAL DATA:  History of ovarian cancer.  EXAM: CT ABDOMEN AND PELVIS WITH CONTRAST  TECHNIQUE: Multidetector CT imaging of the abdomen and pelvis was performed using the standard protocol following bolus administration of intravenous contrast.  CONTRAST:  35mL OMNIPAQUE IOHEXOL 300 MG/ML SOLN, 13mL OMNIPAQUE IOHEXOL 300  MG/ML SOLN  COMPARISON:  10/10/2013  FINDINGS: Lower chest: No pleural effusion identified. Multiple new nodules identified within the lung bases. New nodule within the posterior medial right lower lobe measures 1.2 cm, image 7/series 5. Left base nodule measures 0.6 cm, image 7/series 5.  Hepatobiliary: Multifocal liver lesions are again identified. Compared with the previous exam there has been a mixed response to therapy. Some lesions are smaller or have resolved in the interval. Other lesions are new. Lesion within segment 3 of the left hepatic lobe measures 3.7 cm, image 26/series 2. Previously 2.5 cm. Within segment 4B of the liver there is a 2.9 cm lesion, image 26/ series 2 previously 0.7 cm. Index lesion within segment 7 of the right hepatic lobe measures 1.3 cm, image 27/series 2. Previously 2.4 cm. Multiple stones are identified within the gallbladder. These measure up to 8 mm. There is no biliary dilatation.  Spleen: The spleen appears normal.  Pancreas: Normal appearance of the pancreas.  Stomach/Bowel: The stomach is unremarkable. The proximal and mid small bowel loops appear increased in caliber measuring up to 3.8 cm and there are multiple small bowel air-fluid levels. The distal small bowel loops appear decreased in caliber. Normal caliber of the colon.  Adrenals/urinary tract: The adrenal glands are both normal. The left kidney is unremarkable. There is right hydronephrosis and hydroureter which is new from previous exam. The urinary bladder is unremarkable.  Vascular/Lymphatic: Calcified atherosclerotic disease involves the abdominal aorta. No aneurysm. Filter is identified within the inferior vena cava. No retroperitoneal adenopathy identified. No mesenteric adenopathy. New low-attenuation adenopathy within the right external iliac lymph node chain noted. Index lymph node measures 2.4 cm, image 80/ series 2. Previously 1.6 cm. Left external iliac lymph node measures 1.8 cm, image 81/series 2.  Previously this measured the same.  Reproductive: Previous hysterectomy. Large mass within the left side of pelvis measures 8.2 x 6.2 cm, image 83/series 2. Previously 6.1 x 5.8 cm. New peritoneal nodule along the ventral surface of the descending colon measures 0.9 x 1.6 cm  Musculoskeletal: Review  of the visualized bony structures is negative for aggressive lytic or sclerotic bone lesion. Degenerative disc disease at L5-S1 is noted.  Other: Interval development of extensive abdominal and pelvic ascites which appears partially loculated.  IMPRESSION: 1. Evidence of progressive pulmonary metastasis with multiple new nodules in both lung bases. 2. Progression of peritoneal disease. The dominant mass in the left side of pelvis has increased in size from previous exam. Interval development of massive abdominal and pelvic ascites which is partially loculated. 3. Mixed response with respect to the multiple liver metastasis. Some of the previously noted liver lesions have decreased or resolved in the interval. Others have increased in size. 4. Findings concerning for high-grade partial obstruction perhaps due to adhesive disease or peritoneal 5. Right-sided hydronephrosis.  New from previous exam.  These results will be called to the ordering clinician or representative by the Radiologist Assistant, and communication documented in the PACS or zVision Dashboard.   Electronically Signed   By: Kerby Moors M.D.   On: 01/19/2014 15:54   US Abdomen Limited  01/29/2014   CLINICAL DATA:  Ovarian carcinoma, ascites, assessment for paracentesis  EXAM: LIMITED ABDOMEN ULTRASOUND FOR ASCITES  TECHNIQUE: Limited ultrasound survey for ascites was performed in all four abdominal quadrants.  COMPARISON:  01/23/2014  FINDINGS: Small amounts of ascites are seen in the lower quadrants bilaterally.  Minimal LEFT upper quadrant fluid.  Greatest collection of ascites is seen perihepatic.  Volume and distribution of ascites is  insufficient for safe paracentesis at this time.  IMPRESSION: Scattered ascites, most prominent in perihepatic region.  Paracentesis not performed.   Electronically Signed   By: Lavonia Dana M.D.   On: 01/29/2014 14:43   US Paracentesis  01/23/2014   CLINICAL DATA:  Metastatic ovarian carcinoma, abdominal pain, recurrent ascites. Request is made for therapeutic paracentesis.  EXAM: ULTRASOUND GUIDED THERAPEUTIC PARACENTESIS  COMPARISON:  PRIOR PARACENTESIS ON 06/12/2012  PROCEDURE: An ultrasound guided paracentesis was thoroughly discussed with the patient and questions answered. The benefits, risks, alternatives and complications were also discussed. The patient understands and wishes to proceed with the procedure. Written consent was obtained.  Ultrasound was performed to localize and mark an adequate pocket of fluid in the left upper to mid quadrant of the abdomen. The area was then prepped and draped in the normal sterile fashion. 1% Lidocaine was used for local anesthesia. Under ultrasound guidance a 19 gauge Yueh catheter was introduced. Paracentesis was performed. The catheter was removed and a dressing applied.  Complications: None.  FINDINGS: A total of approximately 6.2 liters of cream -colored/chylous appearing fluid was removed.  IMPRESSION: Successful ultrasound guided therapeutic paracentesis yielding 6.2 liters of ascites.  Read by: Rowe Robert, PA-C   Electronically Signed   By: Arne Cleveland M.D.   On: 01/23/2014 16:48   Dg Abd 2 Views  01/28/2014   CLINICAL DATA:  Abdominal pain, distention and vomiting. History of ovarian cancer.  EXAM: ABDOMEN - 2 VIEW  COMPARISON:  Abdominal ultrasound 01/23/2014. CT abdomen and pelvis 01/19/2014.  FINDINGS: No free intraperitoneal air is identified. The bowel gas pattern is nonobstructive. Increased density of the abdomen and centralization of bowel loops is compatible with ascites as seen on the comparison examinations. IVC filter is in place. No focal  bony abnormality is identified.  IMPRESSION: Negative for free air or bowel obstruction.  Findings compatible with ascites.   Electronically Signed   By: Inge Rise M.D.   On: 01/28/2014 22:48    Microbiology: No  results found for this or any previous visit (from the past 240 hour(s)).   Labs: Basic Metabolic Panel:  Recent Labs Lab 01/28/14 2247 01/29/14 0423  NA 130* 129*  K 4.3 4.2  CL 92* 95*  CO2 18* 19  GLUCOSE 147* 128*  BUN 64* 64*  CREATININE 2.21* 1.92*  CALCIUM 10.2 8.9   Liver Function Tests:  Recent Labs Lab 01/28/14 2247  AST 19  ALT 12  ALKPHOS 138*  BILITOT 0.4  PROT 8.0  ALBUMIN 3.0*    Recent Labs Lab 01/28/14 2247  LIPASE 32   No results found for this basename: AMMONIA,  in the last 168 hours CBC:  Recent Labs Lab 01/28/14 2247 01/29/14 0423  WBC 17.4* 15.3*  NEUTROABS 14.3*  --   HGB 14.3 12.1  HCT 41.6 36.2  MCV 88.9 89.4  PLT 446* 395   Cardiac Enzymes: No results found for this basename: CKTOTAL, CKMB, CKMBINDEX, TROPONINI,  in the last 168 hours BNP: BNP (last 3 results) No results found for this basename: PROBNP,  in the last 8760 hours CBG: No results found for this basename: GLUCAP,  in the last 168 hours  Time coordinating discharge: 45 minutes  Signed:  Micha Erck  [facility] 02/01/2014, 10:51 AM

## 2014-01-31 NOTE — Progress Notes (Signed)
[facility] PROGRESS NOTE  Monique [name] XBD:[id number] DOB: Jun 12, 1945 DOA: 01/28/2014 PCP: Phineas Inches, MD  Assessment/Plan  Nausea vomiting, probably related to patient's metastatic cancer with possible bowel obstruction -  Continue CLD as tolerated -  Continue slow IVF  -  Continue zofran -  Continue reglan for prn nausea  -  Continue octreotide and dexamethasone -  Transition to subcut octreotide tomorrow for discharge  Acute renal failure with metabolic acidosis, likely prerenal and improved somewhat with hydration already.  CAT scan done last week did showed new right hydronephrosis, however, her prognosis is limited and she is not a good candidate for stent placement -  Continue IVF as above  Increasing abdominal pain with malignant Ascites, last paracentesis on 10/2, -  Minimal ascites on [place] -   Continue sublingual morphine for mod to severe pain with IV if she is having too much nausea and vomiting  Metastatic ovarian carcinoma - progressive disease.  -  Plan for inpatient hospice - SW consult placed -  Appreciate Dr. Mariana Kaufman and Dr. 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 -  Patient's oncologist discontinued Coumadin few days ago   Severe protein calorie malnutrition due to progressive malignancy -  Appreciate nutrition assistance -  Advance diet as tolerated and supplements  Leukocytosis, likely related to dehydration and progressive cancer.  No evidence of pneumonia clinically  Hyponatremia likely due to liver mets, ascities   Diet:  CLD Access:  PIV IVF:  yes Proph:  SCDs  Code Status: DNR Family Communication: patient alone Disposition Plan:  Inpatient hospice tomorrow   Consultants:  Palliative care  Dr. Marko Plume  Procedures:    10/7 KUB:  No free air or  bowel obstruction  Antibiotics:  None   HPI/Subjective:  Slept much better last night.  Did not like how morphine and phenergan made her feel.   Objective: Filed Vitals:   01/30/14 1316 01/30/14 2047 01/31/14 0549 01/31/14 1339  BP: 116/71 122/74 116/80 113/82  Pulse: 107 83 103 90  Temp: 96.8 F (36 C) 97.9 F (36.6 C) 97.8 F (36.6 C) 97.5 F (36.4 C)  TempSrc: Axillary Oral Oral Axillary  Resp: 18 18 20 18   Height:      Weight:      SpO2: 100% 97% 98% 99%    Intake/Output Summary (Last 24 hours) at 01/31/14 1739 Last data filed at 01/31/14 1437  Gross per 24 hour  Intake 1780.94 ml  Output    300 ml  Net 1480.94 ml   Filed Weights   01/29/14 0132  Weight: 60.827 kg (134 lb 1.6 oz)    Exam:   General:  Cachectic WF, No acute distress  HEENT:  NCAT, MMM  Cardiovascular:  Tachycardic RR, nl S1, S2 no mrg, 2+ pulses, warm extremities  Respiratory:  CTAB, no increased WOB  Abdomen:   Hypoactive BS, soft, mildly distended, firm feeling and somewhat nodular, nontender  MSK:   Normal tone and bulk, no LEE  Neuro:  Grossly intact  Data Reviewed: Basic Metabolic Panel:  Recent Labs Lab 01/28/14 2247 01/29/14 0423  NA 130* 129*  K 4.3 4.2  CL 92* 95*  CO2 18* 19  GLUCOSE 147* 128*  BUN 64* 64*  CREATININE 2.21* 1.92*  CALCIUM 10.2 8.9   Liver Function Tests:  Recent Labs Lab  01/28/14 2247  AST 19  ALT 12  ALKPHOS 138*  BILITOT 0.4  PROT 8.0  ALBUMIN 3.0*    Recent Labs Lab 01/28/14 2247  LIPASE 32   No results found for this basename: AMMONIA,  in the last 168 hours CBC:  Recent Labs Lab 01/28/14 2247 01/29/14 0423  WBC 17.4* 15.3*  NEUTROABS 14.3*  --   HGB 14.3 12.1  HCT 41.6 36.2  MCV 88.9 89.4  PLT 446* 395   Cardiac Enzymes: No results found for this basename: CKTOTAL, CKMB, CKMBINDEX, TROPONINI,  in the last 168 hours BNP (last 3 results) No results found for this basename: PROBNP,  in the last 8760  hours CBG: No results found for this basename: GLUCAP,  in the last 168 hours  No results found for this or any previous visit (from the past 240 hour(s)).   Studies: No results found.  Scheduled Meds: . dexamethasone  4 mg Intravenous 3 times per day  . feeding supplement (RESOURCE BREEZE)  1 Container Oral BID BM  . ondansetron (ZOFRAN) IV  8 mg Intravenous 4 times per day  . pantoprazole (PROTONIX) IV  40 mg Intravenous Q12H   Continuous Infusions: . sodium chloride 50 mL/hr at 01/31/14 1438  . octreotide (SANDOSTATIN) infusion 25 mcg/hr (01/31/14 1323)    Principal Problem:   Nausea with vomiting Active Problems:   Chronic systolic heart failure   Acute renal failure   Ascites   Nausea & vomiting   Protein-calorie malnutrition, severe    Time spent: 30 min    Monique [name], [facility] Pager [phone]. If 7PM-7AM, please contact night-coverage at [email or web address], password [email or web address] 01/31/2014, 5:39 PM  LOS: 3 days

## 2014-01-31 NOTE — Progress Notes (Signed)
Clinical Social Work Department BRIEF PSYCHOSOCIAL ASSESSMENT 01/31/2014  Patient:  Monique [name], Monique [name]     Account Number:  [id number]     Admit date:  01/28/2014  Clinical Social Worker:  Earlie Server  Date/Time:  01/31/2014 09:00 AM  Referred by:  Physician  Date Referred:  01/31/2014 Referred for  Residential hospice placement   Other Referral:   Interview type:  Patient Other interview type:    PSYCHOSOCIAL DATA Living Status:  FAMILY Admitted from facility:   Level of care:   Primary support name:  Clair Gulling Primary support relationship to patient:  FRIEND Degree of support available:   Strong    CURRENT CONCERNS Current Concerns  Post-Acute Placement   Other Concerns:    SOCIAL WORK ASSESSMENT / PLAN CSW received referral in order to assist with hospice placement. CSW reviewed chart and met with patient and boyfriend Clair Gulling) at bedside. CSW introduced myself and explained role.    Patient reports that they met with PMT and wants hospice. Boyfriend prefers [facility] because it is close to his house and he wants to visit often. CSW provided Hospice choice and explained process. Patient agreeable to [facility] and requested that CSW make referral.    CSW spoke with Nunzio Cory at [facility] who reports available beds and will come to the hospital to evaluate patient today. CSW informed patient and boyfriend and will continue to follow.   Assessment/plan status:  Psychosocial Support/Ongoing Assessment of Needs Other assessment/ plan:   Information/referral to community resources:   Hospice choice    PATIENT'S/FAMILY'S RESPONSE TO PLAN OF CARE: Patient and boyfriend engaged in assessment. Patient reports she has been nauseous and allowed boyfriend to ask several questions. Boyfriend tearful and reports it has been a difficult journey and difficult decision to make. Patient reports boyfriend has taken wonderful care of her but she is to the point where  she requires more assistance than boyfriend can provide. CSW offered emotional support to patient and boyfriend. CSW allowed boyfriend time to talk about his relationship with patient. Patient and boyfriend have been together for 18 years and have a dog together. Boyfriend reports he has enjoyed caring for patient but is sad that patient is now requiring hospice care. Patient and boyfriend agreeable to further support from [place].       Sindy Messing, LCSW (Weekend Coverage)

## 2014-02-01 DIAGNOSIS — N1330 Unspecified hydronephrosis: Secondary | ICD-10-CM

## 2014-02-01 DIAGNOSIS — E43 Unspecified severe protein-calorie malnutrition: Secondary | ICD-10-CM

## 2014-02-01 MED ORDER — MORPHINE SULFATE (CONCENTRATE) 10 MG /0.5 ML PO SOLN: 5.0000 mg | ORAL | Status: AC | PRN

## 2014-02-01 MED ORDER — BOOST / RESOURCE BREEZE PO LIQD
1.0000 | Freq: Two times a day (BID) | ORAL | Status: AC
Start: 2014-02-01 — End: ?

## 2014-02-01 MED ORDER — PANTOPRAZOLE SODIUM 40 MG IV SOLR: 40.0000 mg | Freq: Two times a day (BID) | INTRAVENOUS | Status: AC

## 2014-02-01 MED ORDER — MORPHINE SULFATE 2 MG/ML IJ SOLN: 2.0000 mg | INTRAMUSCULAR | Status: AC | PRN

## 2014-02-01 MED ORDER — BISACODYL 10 MG RE SUPP: 10.0000 mg | Freq: Every day | RECTAL | Status: AC | PRN

## 2014-02-01 MED ORDER — CALCIUM CARBONATE ANTACID 500 MG PO CHEW
1.0000 | CHEWABLE_TABLET | ORAL | Status: DC | PRN
  Administered 2014-02-01: 400 mg via ORAL
  Filled 2014-02-01: qty 1

## 2014-02-01 MED ORDER — DEXAMETHASONE SODIUM PHOSPHATE 4 MG/ML IJ SOLN: 4.0000 mg | Freq: Three times a day (TID) | INTRAMUSCULAR | Status: AC

## 2014-02-01 MED ORDER — METOCLOPRAMIDE HCL 5 MG/ML IJ SOLN: 10.0000 mg | Freq: Four times a day (QID) | INTRAMUSCULAR | Status: AC | PRN

## 2014-02-01 MED ORDER — ONDANSETRON 8 MG/NS 50 ML IVPB: 8.0000 mg | Freq: Four times a day (QID) | INTRAVENOUS | Status: AC

## 2014-02-01 MED ORDER — LORAZEPAM 2 MG/ML IJ SOLN: 0.5000 mg | INTRAMUSCULAR | Status: AC | PRN

## 2014-02-01 MED ORDER — OCTREOTIDE ACETATE 200 MCG/ML IJ SOLN: 200.0000 ug | Freq: Three times a day (TID) | INTRAMUSCULAR | Status: AC

## 2014-02-01 NOTE — Progress Notes (Signed)
CARE MANAGEMENT NOTE 02/01/2014  Patient:  Monique [name], Monique [name]   Account Number:  [id number]  Date Initiated:  01/29/2014  Documentation initiated by:  Sunday Spillers  Subjective/Objective Assessment:   69 yo female admitted with n/v, dehydration, mets ovarian cancer.     Action/Plan:   Home when stable, [facility] consulted but had not started care PTA.   Anticipated DC Date:  02/01/2014   Anticipated DC Plan:  [place]  CM consult      PAC Choice  HOSPICE   Choice offered to / List presented to:             Status of service:  Completed, signed off Medicare Important Message given?  YES (If response is "NO", the following Medicare IM given date fields will be blank) Date Medicare IM given:  02/01/2014 Medicare IM given by:  [employer] Date Additional Medicare IM given:   Additional Medicare IM given by:    Discharge Disposition:  [place]  Per UR Regulation:  Reviewed for med. necessity/level of care/duration of stay  If discussed at [place] of Stay Meetings, dates discussed:    Comments:  02/01/2014 1000 NCM spoke to pt's family. No NCM needs identified. Scheduled do to Hospice Home. Monique Finner RN CCM Case Mgmt phone [phone]

## 2014-02-01 NOTE — Progress Notes (Signed)
Pt for discharge to [facility].   CSW received notification from [facility] [place] liaison, Nunzio Cory this morning that she has met with pt and pt significant other and all are in agreement to transfer to [facility] [place] today.   CSW facilitated pt discharge needs including discussing with [facility], discussing with pt at bedside and providing support, and arranging ambulance transport for pt to [place] [facility] [place]. CSW confirmed with RN that RN called report.  Pt coping appropriately with transition to [facility] [place].  No further social work needs identified at this time.  CSW signing off.   Alison Murray, MSW, LCSW Clinical Social Work [employer] [phone]

## 2014-02-06 ENCOUNTER — Ambulatory Visit: Payer: Medicare Other | Admitting: Oncology

## 2014-02-06 ENCOUNTER — Other Ambulatory Visit: Payer: Medicare Other

## 2014-02-20 ENCOUNTER — Other Ambulatory Visit: Payer: Self-pay | Admitting: Oncology

## 2014-02-20 NOTE — Progress Notes (Signed)
Medical Oncology  Notified by Hospice that patient died Feb 18, 2014 at 2300. Sympathy letter written to [facility] now. I will let other MDs know.  Godfrey Pick, MD

## 2014-02-22 DEATH — deceased

## 2014-02-24 DIAGNOSIS — Z515 Encounter for palliative care: Secondary | ICD-10-CM | POA: Insufficient documentation

## 2014-02-26 ENCOUNTER — Other Ambulatory Visit: Payer: Self-pay | Admitting: Oncology

## 2015-05-30 IMAGING — CT CT CHEST W/ CM
2 of 5 series · 16 of 46 positions shown, 18 images · IV contrast (OMNIPAQUE)
Comparison: CT 08/19/2012

CT CHEST

CLINICAL DATA: Ovarian cancer.  Chemotherapy complete.

CT CHEST, ABDOMEN AND PELVIS WITH CONTRAST
TECHNIQUE: Multidetector CT imaging of the chest, abdomen and
pelvis was performed following the standard protocol during bolus
administration of intravenous contrast.
Contrast: 100mL OMNIPAQUE IOHEXOL 300 MG/ML  SOLN

[Series 2: cap with st · axial · 0.78mm/px · z∈[-584,-38]mm · 13 of 125 slices shown, 15 images]
[im 8/125  soft-tissue]
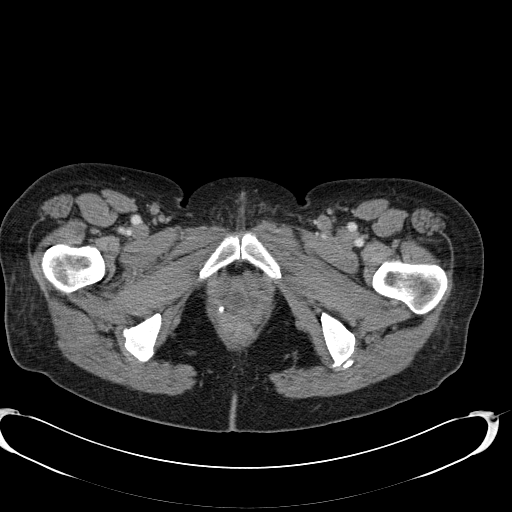
[im 8/125  bone]
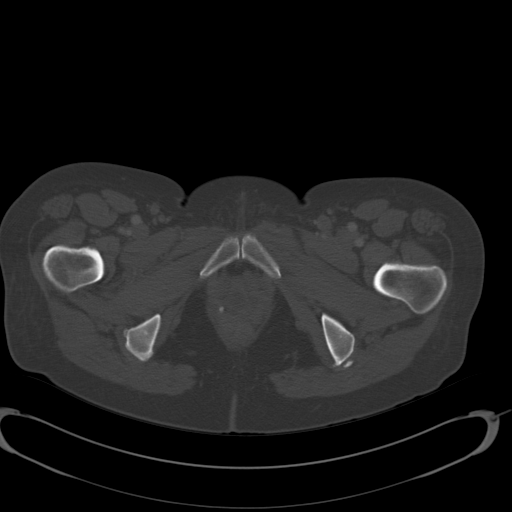
[im 15/125  soft-tissue]
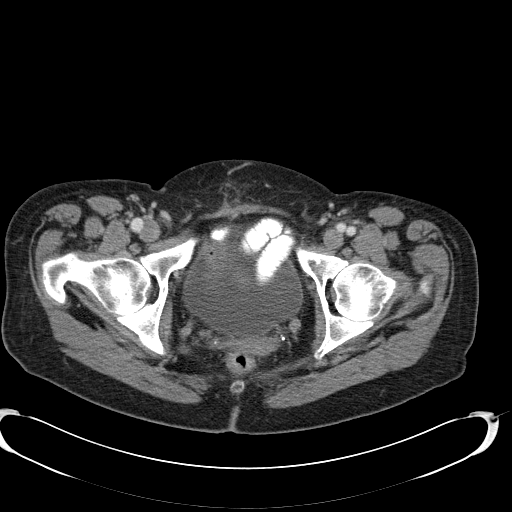
[im 30/125  soft-tissue]
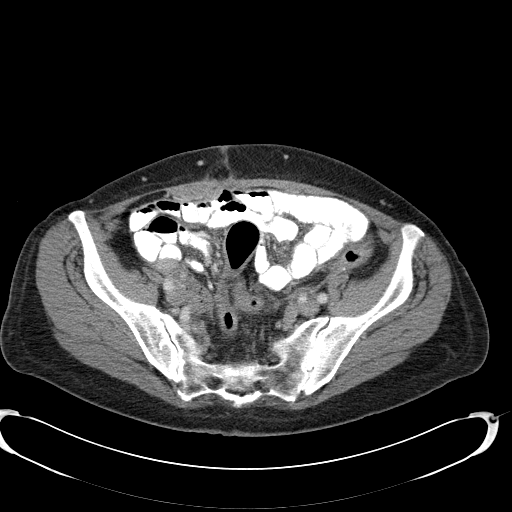
[im 37/125  soft-tissue]
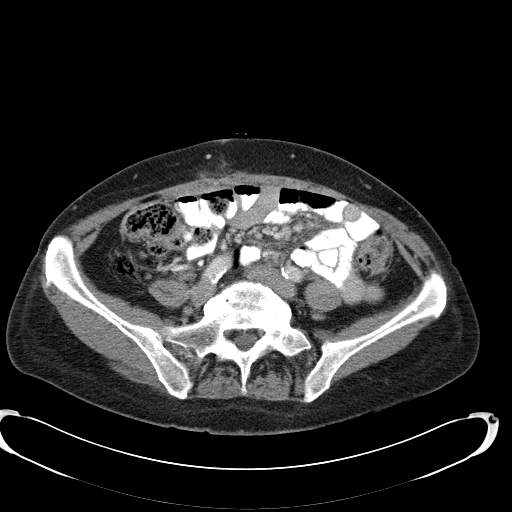
[im 44/125  soft-tissue]
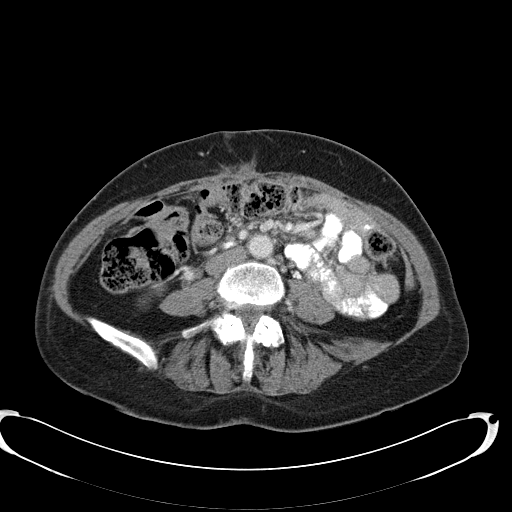
[im 52/125  soft-tissue]
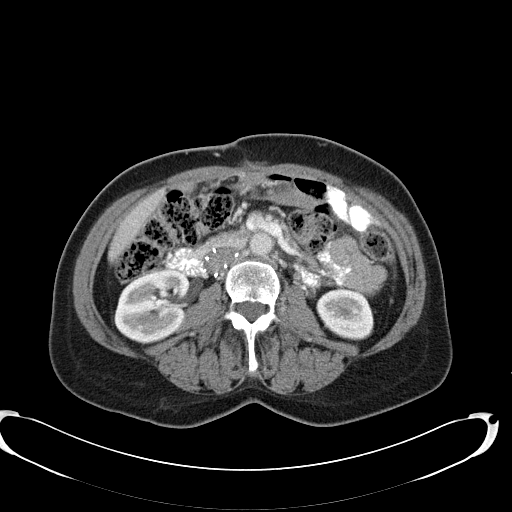
[im 66/125  soft-tissue]
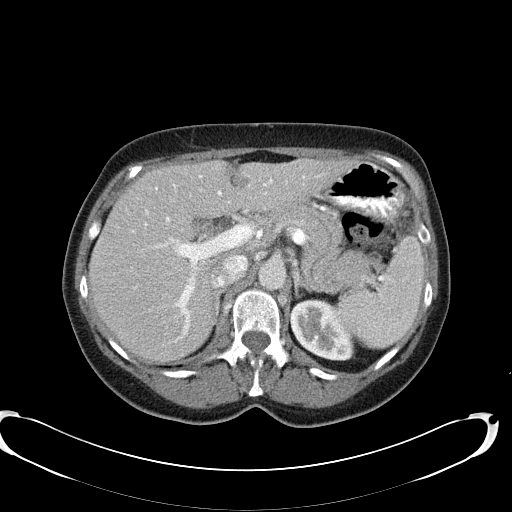
[im 73/125  soft-tissue]
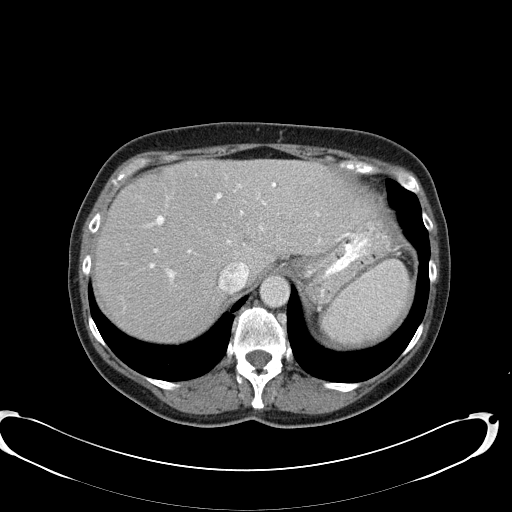
[im 81/125  soft-tissue]
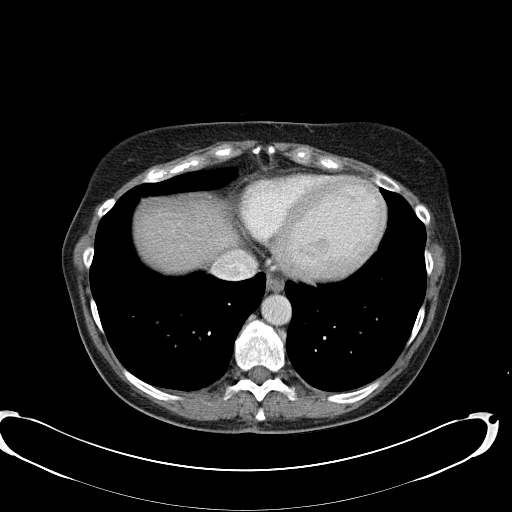
[im 81/125  bone]
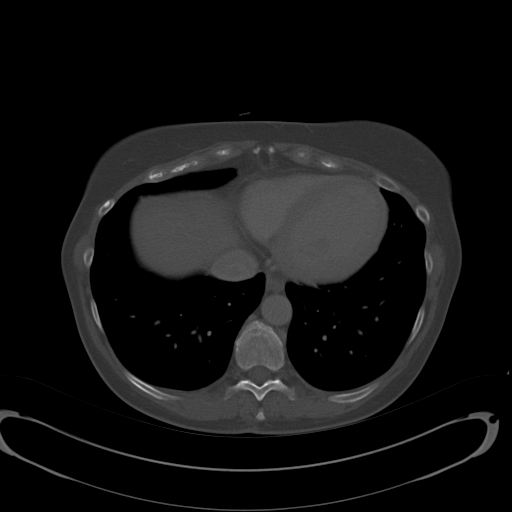
[im 88/125  soft-tissue]
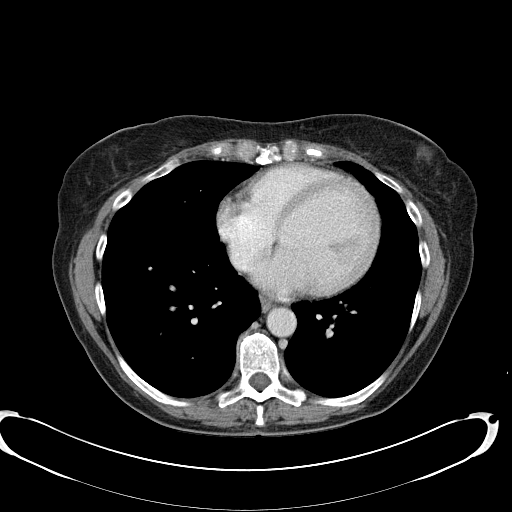
[im 95/125  soft-tissue]
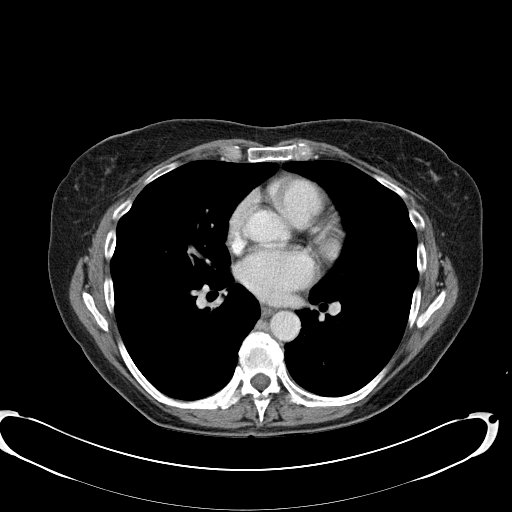
[im 110/125  soft-tissue]
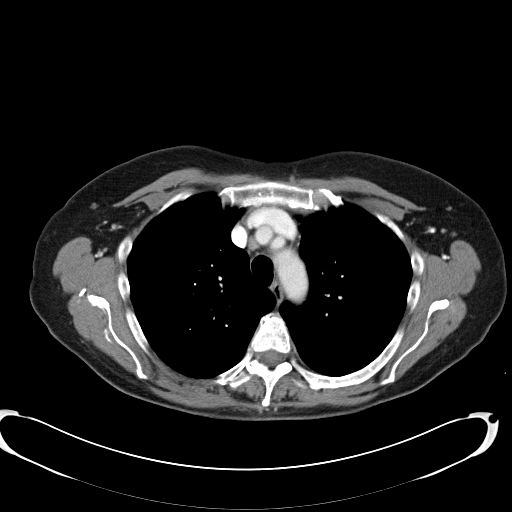
[im 117/125  soft-tissue]
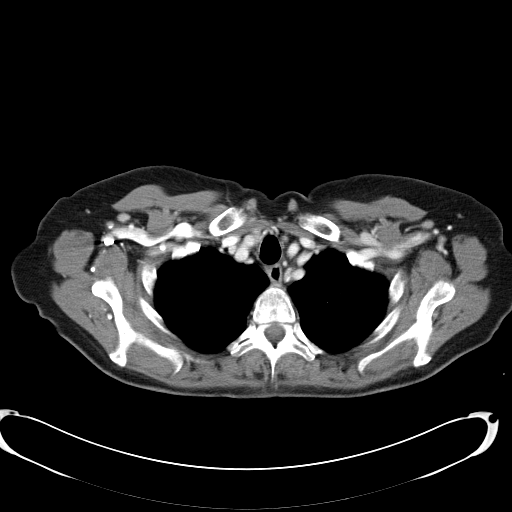

[Series 602: <mpr thick range> · coronal · 1.22mm/px · 3 of 78 slices shown]
[im 26/78  soft-tissue]
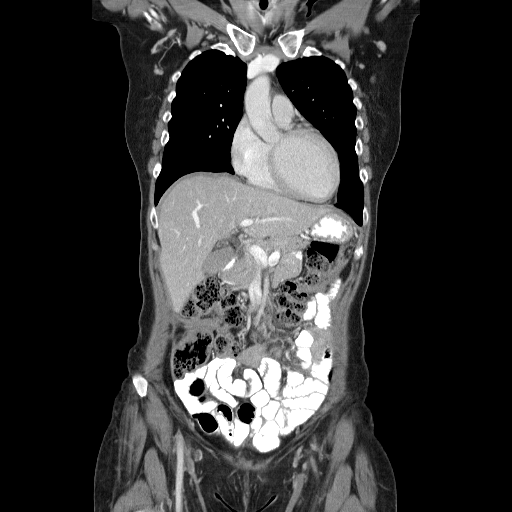
[im 35/78  soft-tissue]
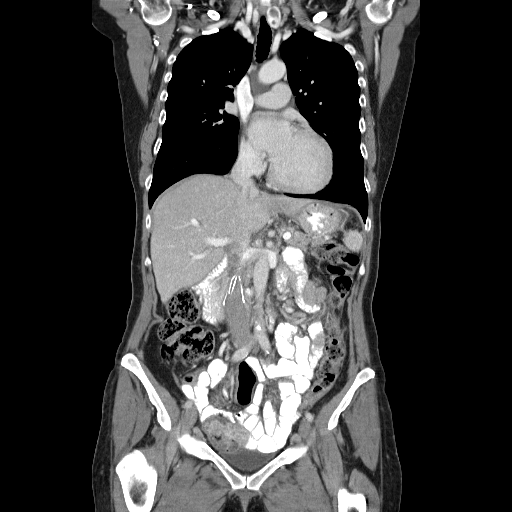
[im 43/78  soft-tissue]
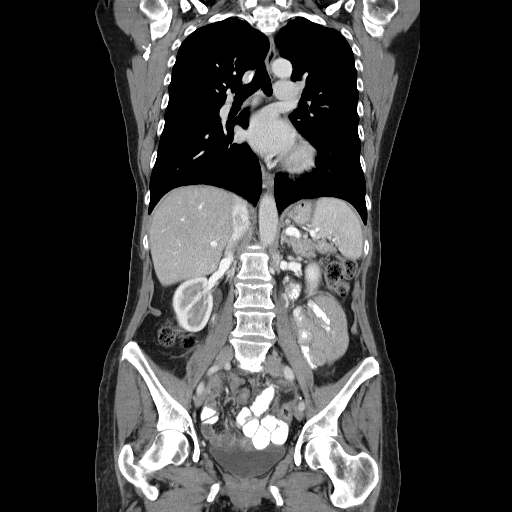

[16 of 46 positions shown; findings below may reference images not displayed]

FINDINGS: No axillary or supraclavicular lymphadenopathy.  There
are nodules within the left and right lobe of the thyroid gland.
The largest in the on the left measures 15 mm.  No mediastinal
lymphadenopathy.  No central pulmonary embolism.  No hilar
adenopathy.

Review of the lung parenchyma demonstrates no suspicious pulmonary
nodules.
IMPRESSION: 1.  No evidence of thoracic metastasis.
2.  Nodular thyroid gland.  Recommend thyroid ultrasound.

CT ABDOMEN AND PELVIS
FINDINGS: No focal parenchymal lesion within the liver.  Along the
falciform ligament there is some nodular thickening measuring 13 x
9 mm (image 60, series 2).  This is more pronounced than on
comparison exam which measure approximately 7 x 9 mm.  The
gallbladder, pancreas, spleen, adrenal glands, kidneys are normal.

The stomach, small bowel and, appendix, and cecum normal.  The
colon and rectosigmoid colon are normal.

Abdominal aorta normal caliber.  No retroperitoneal periportal
lymphadenopathy.

Peritoneal implant in the right iliac fossa measuring 8 mm (image
83) which is unchanged from prior.  No evidence of new peritoneal
disease.  There is some ill-defined thickening along the ventral
peritoneal surface at site of midline surgical scar which is not
changed (image [id number]).

In the pelvis, the bladder and uterus are normal.  No pelvic
lymphadenopathy. IVC filter in infrarenal location.

Review of  bone windows demonstrates no aggressive osseous lesions.
IMPRESSION: 1.  Increased nodular thickening along the falciform ligament is
concerning for residual / progressive peritoneal disease.
2.  Small peritoneal nodule in the right iliac fossa is stable.

## 2015-10-11 ENCOUNTER — Other Ambulatory Visit: Payer: Self-pay | Admitting: Nurse Practitioner
